# Patient Record
Sex: Male | Born: 2000 | Hispanic: Yes | Marital: Single | Smoking: Former smoker
Health system: Southern US, Community
[De-identification: ages and names within clinical notes are randomized; demographics above are authoritative.]

## PROBLEM LIST (undated history)

## (undated) VITALS — BP 138/90 | HR 59 | Temp 98.0°F | Resp 16

## (undated) DIAGNOSIS — L309 Dermatitis, unspecified: Secondary | ICD-10-CM

## (undated) DIAGNOSIS — F259 Schizoaffective disorder, unspecified: Secondary | ICD-10-CM

## (undated) HISTORY — DX: Schizoaffective disorder, unspecified: F25.9

---
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
## 2018-12-06 ENCOUNTER — Encounter (HOSPITAL_COMMUNITY): Payer: Self-pay

## 2018-12-06 ENCOUNTER — Emergency Department (HOSPITAL_COMMUNITY)
Admission: EM | Admit: 2018-12-06 | Discharge: 2018-12-07 | Disposition: A | Discharge status: Other Acute Inpatient Hospital | Payer: Self-pay | Attending: Emergency Medicine | Admitting: Emergency Medicine

## 2018-12-06 DIAGNOSIS — Z9114 Patient's other noncompliance with medication regimen: Secondary | ICD-10-CM | POA: Insufficient documentation

## 2018-12-06 DIAGNOSIS — F29 Unspecified psychosis not due to a substance or known physiological condition: Secondary | ICD-10-CM | POA: Insufficient documentation

## 2018-12-06 DIAGNOSIS — F489 Nonpsychotic mental disorder, unspecified: Secondary | ICD-10-CM | POA: Insufficient documentation

## 2018-12-06 DIAGNOSIS — F17210 Nicotine dependence, cigarettes, uncomplicated: Secondary | ICD-10-CM | POA: Insufficient documentation

## 2018-12-06 DIAGNOSIS — Z20828 Contact with and (suspected) exposure to other viral communicable diseases: Secondary | ICD-10-CM | POA: Insufficient documentation

## 2018-12-06 DIAGNOSIS — F69 Unspecified disorder of adult personality and behavior: Secondary | ICD-10-CM | POA: Insufficient documentation

## 2018-12-06 DIAGNOSIS — Z79899 Other long term (current) drug therapy: Secondary | ICD-10-CM | POA: Insufficient documentation

## 2018-12-06 DIAGNOSIS — Z046 Encounter for general psychiatric examination, requested by authority: Secondary | ICD-10-CM | POA: Insufficient documentation

## 2018-12-06 LAB — COMPREHENSIVE METABOLIC PANEL
ALT: 19 U/L (ref 0–44)
AST: 23 U/L (ref 15–41)
Albumin: 4.9 g/dL (ref 3.5–5.0)
Alkaline Phosphatase: 50 U/L (ref 38–126)
Anion gap: 7 (ref 5–15)
BUN: 11 mg/dL (ref 6–20)
CO2: 27 mmol/L (ref 22–32)
Calcium: 9.3 mg/dL (ref 8.9–10.3)
Chloride: 103 mmol/L (ref 98–111)
Creatinine, Ser: 0.84 mg/dL (ref 0.61–1.24)
GFR calc Af Amer: >60 mL/min (ref >60)
GFR calc non Af Amer: >60 mL/min (ref >60)
Glucose, Bld: 101 mg/dL — ABNORMAL HIGH (ref 70–99)
Potassium: 3.7 mmol/L (ref 3.5–5.1)
Sodium: 137 mmol/L (ref 135–145)
Total Bilirubin: 1.1 mg/dL (ref 0.3–1.2)
Total Protein: 8.2 g/dL — ABNORMAL HIGH (ref 6.5–8.1)

## 2018-12-06 LAB — CBC
HCT: 45.4 % (ref 39.0–52.0)
Hemoglobin: 15.1 g/dL (ref 13.0–17.0)
MCH: 31.3 pg (ref 26.0–34.0)
MCHC: 33.3 g/dL (ref 30.0–36.0)
MCV: 94.2 fL (ref 80.0–100.0)
Platelets: 301 10*3/uL (ref 150–400)
RBC: 4.82 MIL/uL (ref 4.22–5.81)
RDW: 12.9 % (ref 11.5–15.5)
WBC: 10.4 10*3/uL (ref 4.0–10.5)
nRBC: 0 % (ref 0.0–0.2)

## 2018-12-06 LAB — ETHANOL: Alcohol, Ethyl (B): <10 mg/dL (ref <10)

## 2018-12-06 LAB — SALICYLATE LEVEL: Salicylate Lvl: <7 mg/dL (ref 2.8–30.0)

## 2018-12-06 LAB — ACETAMINOPHEN LEVEL: Acetaminophen (Tylenol), Serum: <10 ug/mL — ABNORMAL LOW (ref 10–30)

## 2018-12-06 NOTE — ED Triage Notes (Signed)
Pt is IVC'd by his mother, saying that he wasn't taking his medications, he's hearing voices and he's violent towards his family, [employer] said there has been plenty of calls about this patient but he's always running when they arrive

## 2018-12-06 NOTE — ED Notes (Signed)
Pt given maroon scrubs, socks, and belongings bag

## 2018-12-06 NOTE — ED Provider Notes (Addendum)
[facility]   CSN: [id number] Arrival date & time: 12/06/18  2047     History   Chief Complaint Chief Complaint  Patient presents with  . IVC    HPI Jay Woods is a 18 y.o. male.     HPI Patient presents the emergency room for evaluation of violent behavior and possible hallucinations.  According to the police they have been called to the patient's home many times but he is generally running away when they arrived.  Today his mother took out IVC paperwork on him.  She indicates that he has not been taking his medications.  Patient himself denies any complaints.  He says that he thinks his mom just wants to be alone with her boyfriend.  Patient admits that he has not been taking his medications.  He states he prefers to smoke.  He denies any suicidal or homicidal ideation.  He denies having any hallucinations. Past Medical History:  Diagnosis Date  . Eczema     Patient Active Problem List   Diagnosis Date Noted  . Substance induced mood disorder (HCC) 02/28/2017  . Intermittent explosive disorder 02/27/2017  . 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 Intermittent explosive disorder in adult 02/27/2017    History reviewed. No pertinent surgical history.      Home Medications    Prior to Admission medications   Medication Sig Start Date End Date Taking? Authorizing Provider  acetaminophen (TYLENOL 8 HOUR) 650 MG CR tablet Take 1 tablet (650 mg total) by mouth every 8 (eight) hours as needed. Patient not taking: Reported on 12/06/2018 10/29/17   Derwood Kaplan, MD  ibuprofen (ADVIL,MOTRIN) 600 MG tablet Take 1 tablet (600 mg total) by mouth every 6 (six) hours as needed. Patient not taking: Reported on 12/06/2018 10/29/17   Derwood Kaplan, MD  lidocaine (XYLOCAINE) 2 % solution Use as directed 15 mLs in the mouth or throat as needed for mouth pain. Patient not taking: Reported on 12/06/2018  10/29/17   Derwood Kaplan, MD    Family History History reviewed. No pertinent family history.  Social History Social History   Tobacco Use  . Smoking status: Current Every Day Smoker    Packs/day: 1.00    Types: Cigarettes  . Smokeless tobacco: Former Engineer, water Use Topics  . Alcohol use: Yes  . Drug use: Yes     Allergies   Patient has no known allergies.   Review of Systems Review of Systems  All other systems reviewed and are negative.    Physical Exam Updated Vital Signs BP (!) 149/80 (BP Location: Left Arm)   Pulse 83   Temp 98.3 F (36.8 C) (Oral)   Resp 17   Ht 1.753 m (5\' 9" )   Wt 79.4 kg   SpO2 100%   BMI 25.84 kg/m   Physical Exam Vitals signs and nursing note reviewed.  Constitutional:      General: He is not in acute distress.    Appearance: He is well-developed.  HENT:     Head: Normocephalic and atraumatic.     Right Ear: External ear normal.     Left Ear: External ear normal.  Eyes:     General: No scleral icterus.       Right eye: No discharge.        Left eye: No discharge.     Conjunctiva/sclera: Conjunctivae normal.  Neck:     Musculoskeletal: Neck supple.  Trachea: No tracheal deviation.  Cardiovascular:     Rate and Rhythm: Normal rate and regular rhythm.  Pulmonary:     Effort: Pulmonary effort is normal. No respiratory distress.     Breath sounds: Normal breath sounds. No stridor. No wheezing or rales.  Abdominal:     General: Bowel sounds are normal. There is no distension.     Palpations: Abdomen is soft.     Tenderness: There is no abdominal tenderness. There is no guarding or rebound.  Musculoskeletal:        General: No tenderness.  Skin:    General: Skin is warm and dry.     Findings: No rash.  Neurological:     Mental Status: He is alert.     Cranial Nerves: No cranial nerve deficit (no facial droop, extraocular movements intact, no slurred speech).     Sensory: No sensory deficit.     Motor: No  abnormal muscle tone or seizure activity.     Coordination: Coordination normal.      ED Treatments / Results  Labs (all labs ordered are listed, but only abnormal results are displayed) Labs Reviewed  COMPREHENSIVE METABOLIC PANEL - Abnormal; Notable for the following components:      Result Value   Glucose, Bld 101 (*)    Total Protein 8.2 (*)    All other components within normal limits  CBC  ETHANOL  SALICYLATE LEVEL  ACETAMINOPHEN LEVEL  RAPID URINE DRUG SCREEN, HOSP PERFORMED    Procedures Procedures (including critical care time)  Medications Ordered in ED Medications - No data to display   Initial Impression / Assessment and Plan / ED Course  I have reviewed the triage vital signs and the nursing notes.  Pertinent labs & imaging results that were available during my care of the patient were reviewed by me and considered in my medical decision making (see chart for details).  Clinical Course as of Dec 06 2231  Tue Dec 06, 2018  2233 Is not clear to me that the patient requires inpatient hospitalization.  We will need to corroborate information with his mother.  TTS consult pending   [JK]    Clinical Course User Index [JK] Dorie Rank, MD  Laboratory tests are unremarkable.  Patient is cleared for psychiatric evaluation.  Final Clinical Impressions(s) / ED Diagnoses   Final diagnoses:  Mental and behavioral problem      Dorie Rank, MD 12/06/18 2232    Dorie Rank, MD 12/06/18 2233

## 2018-12-07 ENCOUNTER — Inpatient Hospital Stay (HOSPITAL_COMMUNITY)
Admission: AD | Admit: 2018-12-07 | Discharge: 2018-12-13 | DRG: 885 | Disposition: A | Discharge status: Home / Self Care | Payer: No Typology Code available for payment source | Attending: Psychiatry | Admitting: Psychiatry

## 2018-12-07 ENCOUNTER — Encounter (HOSPITAL_COMMUNITY): Payer: Self-pay | Admitting: Registered Nurse

## 2018-12-07 ENCOUNTER — Emergency Department (HOSPITAL_COMMUNITY): Payer: Self-pay

## 2018-12-07 ENCOUNTER — Encounter (HOSPITAL_COMMUNITY): Payer: Self-pay

## 2018-12-07 DIAGNOSIS — F259 Schizoaffective disorder, unspecified: Principal | ICD-10-CM | POA: Diagnosis present

## 2018-12-07 DIAGNOSIS — Z20828 Contact with and (suspected) exposure to other viral communicable diseases: Secondary | ICD-10-CM | POA: Diagnosis present

## 2018-12-07 DIAGNOSIS — F17210 Nicotine dependence, cigarettes, uncomplicated: Secondary | ICD-10-CM | POA: Diagnosis present

## 2018-12-07 DIAGNOSIS — G4700 Insomnia, unspecified: Secondary | ICD-10-CM | POA: Diagnosis present

## 2018-12-07 DIAGNOSIS — F29 Unspecified psychosis not due to a substance or known physiological condition: Secondary | ICD-10-CM | POA: Diagnosis present

## 2018-12-07 LAB — RAPID URINE DRUG SCREEN, HOSP PERFORMED
Amphetamines: NOT DETECTED
Barbiturates: NOT DETECTED
Benzodiazepines: POSITIVE — AB
Cocaine: NOT DETECTED
Opiates: NOT DETECTED
Tetrahydrocannabinol: NOT DETECTED

## 2018-12-07 LAB — SARS CORONAVIRUS 2 BY RT PCR (HOSPITAL ORDER, PERFORMED IN ~~LOC~~ HOSPITAL LAB): SARS Coronavirus 2: NEGATIVE

## 2018-12-07 MED ORDER — ACETAMINOPHEN 325 MG PO TABS
650.0000 mg | ORAL_TABLET | Freq: Four times a day (QID) | ORAL | Status: DC | PRN
  Administered 2018-12-07 – 2018-12-12 (×2): 650 mg via ORAL
  Filled 2018-12-07 (×3): qty 2

## 2018-12-07 MED ORDER — HYDROXYZINE HCL 25 MG PO TABS: 25.0000 mg | ORAL_TABLET | Freq: Four times a day (QID) | ORAL | Status: DC | PRN

## 2018-12-07 MED ORDER — TRAZODONE HCL 50 MG PO TABS: 50.0000 mg | ORAL_TABLET | Freq: Every evening | ORAL | Status: DC | PRN

## 2018-12-07 MED ORDER — OLANZAPINE 2.5 MG PO TABS
2.5000 mg | ORAL_TABLET | Freq: Every day | ORAL | Status: DC
  Administered 2018-12-07: 2.5 mg via ORAL
  Filled 2018-12-07 (×3): qty 1

## 2018-12-07 NOTE — Progress Notes (Signed)
Pt has flight of ideas , disorganized thought content at times , and pressured speech.

## 2018-12-07 NOTE — Progress Notes (Signed)
Received Jay Woods from the main ED, alert and calm. Later he spoke with TTS in depth and his IVC paperwork was faxed to TTS. He drifted off to sleep with the sitter at the bedside. He slept throughout the night without incident.

## 2018-12-07 NOTE — BH Assessment (Signed)
Tele Assessment Note   Patient Name: Jay Woods MRN: [id number] Referring Physician: Dr. Dorie Rank Location of Patient: [facility] Location of Provider: [place] is an 18 y.o. male presenting under IVC, petitioned by mother for not taking his medications, hearing voices and acting violent towards family. Per [employer], they have received plenty of calls about this patient but he's always running when they arrive. When asked patient why is he here, he stated "my mom loves my dad and when she sees me I remind her of him so I am okay with that". Patient continues to go on with flight of ideas, hyper verbal reminiscing about childhood, dreams and then crying stating his sister is his main support. Patient spoke religiously and continually about his sister, soccer and his faith in [place]. Patient continued to ramble about wearing an ankle bracelet, "its for the children so they can follow their dreams" and his job, "if you have 2 arms, 2 legs, 10 fingers, eat, know self love and faith". Patient reported living with mother and 2 sisters in the household as well. Patient reported being employed. Patient denied SI, HI and psychosis. Patient was talkative and cooperative during assessment.       PER IVC: [name] is suicidal and it hearing voices and talking to unknown people, respondent is abusing drugs and very violent.                                                                                                                                            Diagnosis: Psychosis disorder, unspecified  Past Medical History:  Past Medical History:  Diagnosis Date  . Eczema     History reviewed. No pertinent surgical history.  Family History: History reviewed. No pertinent family history.  Social History:  reports that he has been smoking cigarettes. He has been smoking about 1.00 pack per day. He has quit using smokeless tobacco. He reports current alcohol use.  He reports current drug use.  Additional Social History:  Alcohol / Drug Use Pain Medications: see MAR Prescriptions: see MAR Over the Counter: see MAR  CIWA: CIWA-Ar BP: (!) 149/80 Pulse Rate: 83 COWS:    Allergies: No Known Allergies  Home Medications: (Not in a hospital admission)   OB/GYN Status:  No LMP for male patient.  General Assessment Data Location of Assessment: [facility] TTS Assessment: In system Is this a Tele or Face-to-Face Assessment?: Tele Assessment Is this an Initial Assessment or a Re-assessment for this encounter?: Initial Assessment Patient Accompanied by:: N/A Living Arrangements: (family home) What gender do you identify as?: Male Marital status: Single Living Arrangements: Parent, Other relatives Can pt return to current living arrangement?: Yes Admission Status: Involuntary Petitioner: Family member Is patient capable of signing voluntary admission?: (IVC) Referral Source: Self/Family/Friend     Crisis Care Plan Living Arrangements: Parent, Other relatives Legal Guardian: (  self) Name of Psychiatrist: (none) Name of Therapist: (none)  Education Status Is patient currently in school?: No Is the patient employed, unemployed or receiving disability?: Employed  Risk to self with the past 6 months Suicidal Ideation: No Has patient been a risk to self within the past 6 months prior to admission? : No Suicidal Intent: No Has patient had any suicidal intent within the past 6 months prior to admission? : No Is patient at risk for suicide?: No Suicidal Plan?: No Has patient had any suicidal plan within the past 6 months prior to admission? : No Access to Means: No What has been your use of drugs/alcohol within the last 12 months?: (unknown) Previous Attempts/Gestures: No How many times?: (0) Other Self Harm Risks: (none) Triggers for Past Attempts: (n/a) Intentional Self Injurious Behavior: None Family Suicide History: No Recent stressful  life event(s): (family conflict) Persecutory voices/beliefs?: No Depression: (unable to assess) Depression Symptoms: (unable to assess) Substance abuse history and/or treatment for substance abuse?: No Suicide prevention information given to non-admitted patients: Not applicable  Risk to Others within the past 6 months Homicidal Ideation: No Does patient have any lifetime risk of violence toward others beyond the six months prior to admission? : No Thoughts of Harm to Others: No Current Homicidal Intent: No Current Homicidal Plan: No Access to Homicidal Means: No Identified Victim: (n/a) History of harm to others?: Yes(IVC- towards family) Assessment of Violence: None Noted Violent Behavior Description: (IVC- towards family) Does patient have access to weapons?: No Criminal Charges Pending?: No Does patient have a court date: No Is patient on probation?: Yes(possession of stolen vehicle)  Psychosis Hallucinations: Visual, Auditory(per IVC) Delusions: None noted  Mental Status Report Appearance/Hygiene: Unremarkable Eye Contact: Fair Motor Activity: Freedom of movement Speech: Rapid(hyper verbal) Level of Consciousness: Alert Mood: Anxious Affect: Anxious Anxiety Level: Moderate Thought Processes: Coherent, Flight of Ideas Judgement: Impaired Orientation: Person, Place, Time, Situation Obsessive Compulsive Thoughts/Behaviors: None  Cognitive Functioning Concentration: Normal Memory: Recent Intact Is patient IDD: No Insight: Unable to Assess Impulse Control: Poor Appetite: Good Have you had any weight changes? : No Change Sleep: Unable to Assess Total Hours of Sleep: (unable to assess) Vegetative Symptoms: None  ADLScreening [facility] Assessment Services) Patient's cognitive ability adequate to safely complete daily activities?: Yes Patient able to express need for assistance with ADLs?: Yes Independently performs ADLs?: Yes (appropriate for developmental  age)  Prior Inpatient Therapy Prior Inpatient Therapy: No  Prior Outpatient Therapy Prior Outpatient Therapy: No Does patient have an ACCT team?: No Does patient have Intensive In-House Services?  : No Does patient have [facility] services? : No Does patient have P4CC services?: No  ADL Screening (condition at time of admission) Patient's cognitive ability adequate to safely complete daily activities?: Yes Patient able to express need for assistance with ADLs?: Yes Independently performs ADLs?: Yes (appropriate for developmental age)  Disposition:  Disposition Initial Assessment Completed for this Encounter: Yes  Sherron Flemings, NP, patient meets inpatient treatment. TTS to secure placement.  This service was provided via telemedicine using a 2-way, interactive audio and video technology.  Names of all persons participating in this telemedicine service and their role in this encounter. Name: Gail Artica-Limones Role: Patient  Name: Al Corpus Role: TTS Clinician  Name:  Role:   Name:  Role:     Burnetta Sabin 12/07/2018 12:58 AM

## 2018-12-07 NOTE — Progress Notes (Signed)
D: Pt denies SI/HI/AVH. Pt is pleasant and cooperative. Pt remembered writer from previous stay. A: Pt was offered support and encouragement. Pt was given scheduled medications. Pt was encourage to attend groups. Q 15 minute checks were done for safety.  EKG completed  R: safety maintained on unit.

## 2018-12-07 NOTE — BH Assessment (Signed)
[facility] Assessment Progress Note  Per Hampton Abbot, MD, this pt requires psychiatric hospitalization.  Heather, RN has assigned pt to [facility] Rm 501-1.  Pt presents under IVC initiated by pt's mother, and upheld by Dr Dwyane Dee, and IVC documents have been faxed to [facility].  Pt's nurse, Eustaquio Maize, has been notified, and agrees to call report to [phone].  Pt is to be transported via [facility].   Jalene Mullet, [place] Coordinator [phone]

## 2018-12-07 NOTE — ED Notes (Signed)
Pt off unit  to [facility]. Pt alert, calm. Cooperative, no s/s of distrss.  DC information given to [employer] for [facility].  Belongings given to [employer] for [facility]. Pt ambulatory off unit to home. Pt transported by [employer],

## 2018-12-07 NOTE — ED Notes (Signed)
Pt cooperative. Pt rambling.

## 2018-12-08 ENCOUNTER — Other Ambulatory Visit: Payer: Self-pay

## 2018-12-08 DIAGNOSIS — F250 Schizoaffective disorder, bipolar type: Secondary | ICD-10-CM

## 2018-12-08 MED ORDER — HALOPERIDOL LACTATE 5 MG/ML IJ SOLN: 10.0000 mg | Freq: Four times a day (QID) | INTRAMUSCULAR | Status: DC | PRN

## 2018-12-08 MED ORDER — LORAZEPAM 2 MG/ML IJ SOLN: 2.0000 mg | Freq: Four times a day (QID) | INTRAMUSCULAR | Status: DC | PRN

## 2018-12-08 MED ORDER — BENZTROPINE MESYLATE 1 MG PO TABS
1.0000 mg | ORAL_TABLET | Freq: Two times a day (BID) | ORAL | Status: DC
  Administered 2018-12-08 – 2018-12-13 (×11): 1 mg via ORAL
  Filled 2018-12-08 (×13): qty 1

## 2018-12-08 MED ORDER — LORAZEPAM 1 MG PO TABS: 2.0000 mg | ORAL_TABLET | Freq: Four times a day (QID) | ORAL | Status: DC | PRN

## 2018-12-08 MED ORDER — RISPERIDONE 2 MG PO TABS
4.0000 mg | ORAL_TABLET | Freq: Two times a day (BID) | ORAL | Status: DC
  Administered 2018-12-08 – 2018-12-13 (×11): 4 mg via ORAL
  Filled 2018-12-08 (×13): qty 2

## 2018-12-08 MED ORDER — TEMAZEPAM 30 MG PO CAPS
30.0000 mg | ORAL_CAPSULE | Freq: Every day | ORAL | Status: DC
  Administered 2018-12-08 – 2018-12-12 (×5): 30 mg via ORAL
  Filled 2018-12-08 (×5): qty 1

## 2018-12-08 MED ORDER — HALOPERIDOL 5 MG PO TABS: 5.0000 mg | ORAL_TABLET | Freq: Four times a day (QID) | ORAL | Status: DC | PRN

## 2018-12-08 MED ORDER — DIVALPROEX SODIUM 250 MG PO DR TAB
250.0000 mg | DELAYED_RELEASE_TABLET | Freq: Three times a day (TID) | ORAL | Status: DC
  Administered 2018-12-08 – 2018-12-09 (×3): 250 mg via ORAL
  Filled 2018-12-08 (×6): qty 1

## 2018-12-08 NOTE — [facility] Group Notes (Signed)
LCSW Group Therapy Note  Date and Time: 12/08/2018 @ 1:30pm  Type of Therapy and Topic: Group Therapy: Feelings Around Returning Home & Establishing a Supportive Framework and Supporting Oneself When Supports Not Available  Participation Level: [facility] PARTICIPATION LEVEL: Did Not Attend  Mood: Did not attend     Description of Group:  Patients first processed thoughts and feelings about upcoming discharge. These included fears of upcoming changes, lack of change, new living environments, judgements and expectations from others and overall stigma of mental health issues. The group then discussed the definition of a supportive framework, what that looks and feels like, and how do to discern it from an unhealthy non-supportive network. The group identified different types of supports as well as what to do when your family/friends are less than helpful or unavailable     Therapeutic Goals   1.  Patient will identify one healthy supportive network that they can use at discharge.  2.  Patient will identify one factor of a supportive framework and how to tell it from an unhealthy network.  3.  Patient able to identify one coping skill to use when they do not have positive supports from others.  4.  Patient will demonstrate ability to communicate their needs through discussion and/or role plays.     Summary of Patient Progress:       Patient did not attend group therapy today.      Therapeutic Modalities  Cognitive Behavioral Therapy  Motivational Interviewing   Konni Kesinger, MSW, LCSW  
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
## 2018-12-08 NOTE — Progress Notes (Signed)
Recreation Therapy Notes  INPATIENT RECREATION THERAPY ASSESSMENT  Patient Details Name: Eithen Castiglia MRN: [id number] DOB: 12-Nov-2000 Today's Date: 12/08/2018       Information Obtained From: Patient  Able to Participate in Assessment/Interview: Yes  Patient Presentation: Alert  Reason for Admission (Per Patient): Other (Comments)(Pt stated he was depressed.)  Patient Stressors: (No stressors identified)  Coping Skills:   TV, Sports, Music, Exercise, Deep Breathing, Substance Abuse, Talk, Art, Prayer, Other (Comment), Read, Hot Bath/Shower(Sing)  Leisure Interests (2+):  Individual - Reading, Music - Play instrument, Sports - Other (Comment), Sports - Football([place]; Soccer)  Frequency of Recreation/Participation: Other (Comment)(Not lately)  Awareness of Community Resources:  Yes  Community Resources:  Park, Patent examiner  Current Use: Yes  [place] of Residence:  [place]  Patient Main Form of Transportation: Car(Pt also stated he uses Uber/Taxi)  Patient Strengths:  Faith; Supportive person  Patient Identified Areas of Improvement:  Listen more  Patient Goal for Hospitalization:  "make everybody happy and let everybody know if they have faith, they can do anything they want".  Current SI (including self-harm):  No  Current HI:  No  Current AVH: No  Staff Intervention Plan: Group Attendance, Collaborate with Interdisciplinary Treatment Team  Consent to Intern Participation: N/A   Victorino Sparrow, LRT/CTRS  Victorino Sparrow A 12/08/2018, 12:19 PM

## 2018-12-08 NOTE — Progress Notes (Signed)
Pt was on the unit at shift change, but the admission had to be completed on night shift  Admission Note:  18 yr male who presents IVC in no acute distress for the treatment of Psychosis  Pt appears flat and depressed. Pt was calm and cooperative with admission process. Pt presents with passive SI and contracts for safety upon admission. Pt denies AVH . Pt stated he was doing too much CBD oil and his mother wanted him to get checked out. Pt hypo-manic, with pressured speech and disorganized and flight of ideas at times, but pt has been pleasant with the admission especially after recognizing writer from prior admission.   A:Skin was assessed by previous shift. PT searched by previous shift. Consents obtained by previous shift   R:Pt had no additional questions or concerns.

## 2018-12-08 NOTE — Progress Notes (Signed)
Recreation Therapy Notes  Date: 10.15.20 Time: 1000 Location:  [address] Dayroom  Group Topic: Communication, Team Building, Problem Solving  Goal Area(s) Addresses:  Patient will effectively work with peer towards shared goal.  Patient will identify skills used to make activity successful.  Patient will identify how skills used during activity can be used to reach post d/c goals.   Behavioral Response: Engaged  Intervention: STEM Activity  Activity: Geophysicist/field seismologist. In teams patients were given 12 plastic drinking straws and a length of masking tape. Using the materials provided patients were asked to build a landing pad to catch a golf ball dropped from approximately 6 feet in the air.   Education: Education officer, community, Discharge Planning   Education Outcome: Acknowledges education/In group clarification offered/Needs additional education.   Clinical Observations/Feedback:  Pt worked well with peers.  Pt communicated with peers on how to create their landing pad.  Pt worked well with the group to come up with a concept and implement it.      Victorino Sparrow, LRT/CTRS     Victorino Sparrow A 12/08/2018 11:51 AM

## 2018-12-08 NOTE — Tx Team (Signed)
Initial Treatment Plan 12/08/2018 2:18 AM [name] PRX:[id number]    PATIENT STRESSORS: Marital or family conflict Medication change or noncompliance   PATIENT STRENGTHS: General fund of knowledge Motivation for treatment/growth   PATIENT IDENTIFIED PROBLEMS: psychosis  "medicine, sleeping medicine so I don't have to use CBD oil or weed to go to sleep"                   DISCHARGE CRITERIA:  Improved stabilization in mood, thinking, and/or behavior Verbal commitment to aftercare and medication compliance  PRELIMINARY DISCHARGE PLAN: Attend PHP/IOP Outpatient therapy  PATIENT/FAMILY INVOLVEMENT: This treatment plan has been presented to and reviewed with the patient, Jay Woods.  The patient and family have been given the opportunity to ask questions and make suggestions.  Providence Crosby, RN 12/08/2018, 2:18 AM

## 2018-12-08 NOTE — BHH Suicide Risk Assessment (Signed)
[facility] Admission Suicide Risk Assessment   Nursing information obtained from:  Patient Demographic factors:  Male, Unemployed Current Mental Status:  NA Loss Factors:  Financial problems / change in socioeconomic status Historical Factors:  NA Risk Reduction Factors:  Positive social support  Total Time spent with patient: 45 minutes Principal Problem: schizoaffective  Diagnosis:  Active Problems:   Psychotic disorder ([place])  Subjective Data: admitted for mania  Continued Clinical Symptoms:  Alcohol Use Disorder Identification Test Final Score (AUDIT): 6 The "Alcohol Use Disorders Identification Test", Guidelines for Use in Primary Care, Second Edition.  World Pharmacologist [facility]). Score between 0-7:  no or low risk or alcohol related problems. Score between 8-15:  moderate risk of alcohol related problems. Score between 16-19:  high risk of alcohol related problems. Score 20 or above:  warrants further diagnostic evaluation for alcohol dependence and treatment.   CLINICAL FACTORS: mania Musculoskeletal: Strength & Muscle Tone: within normal limits Gait & Station: normal Patient leans: N/A  Psychiatric Specialty Exam: Physical Exam  ROS  Blood pressure 106/75, pulse 71.There is no height or weight on file to calculate BMI.  General Appearance: Casual  Eye Contact:  Good  Speech:  Pressured  Volume:  Increased  Mood:  manic  Affect:  Congruent  Thought Process:  Descriptions of Associations: Loose  Orientation:  Other:  person place  Thought Content:  Illogical  Suicidal Thoughts:  No  Homicidal Thoughts:  No  Memory:  Immediate;   Poor Recent;   Poor Remote;   Fair  Judgement:  Impaired  Insight:  Lacking  Psychomotor Activity:  Normal  Concentration:  Concentration: Poor and Attention Span: Poor  Recall:  Poor  Fund of Knowledge:  Fair  Language:  Fair  Akathisia:  Negative  Handed:  Right  AIMS (if indicated):     Assets:  Physical  Health Resilience Social Support  ADL's:  Intact  Cognition:  WNL  Sleep:  Number of Hours: 6.75      COGNITIVE FEATURES THAT CONTRIBUTE TO RISK:  Loss of executive function    SUICIDE RISK:   Minimal: No identifiable suicidal ideation.  Patients presenting with no risk factors but with morbid ruminations; may be classified as minimal risk based on the severity of the depressive symptoms  PLAN OF CARE: admit for stabalization  I certify that inpatient services furnished can reasonably be expected to improve the patient's condition.   Johnn Hai, MD 12/08/2018, 10:01 AM

## 2018-12-08 NOTE — Progress Notes (Signed)
Psychoeducational Group Note  Date:  12/08/2018 Time: 2043  Group Topic/Focus:  Wrap-Up Group:   The focus of this group is to help patients review their daily goal of treatment and discuss progress on daily workbooks.  Participation Level: Did Not Attend  Participation Quality:  Not Applicable  Affect:  Not Applicable  Cognitive:  Not Applicable  Insight:  Not Applicable  Engagement in Group: Not Applicable  Additional Comments:  The patient did not attend group this evening.   Archie Balboa S 12/08/2018, 8:43 PM

## 2018-12-08 NOTE — Progress Notes (Signed)
D: Pt stated he was ok A: Pt was offered support and encouragement. Pt was given scheduled medications. Pt was encourage to attend groups. Q 15 minute checks were done for safety.  R:Pt attends groups and interacts well with peers and staff. Pt is taking medication. Pt has no complaints.Pt receptive to treatment and safety maintained on unit.

## 2018-12-09 MED ORDER — DIVALPROEX SODIUM 500 MG PO DR TAB
500.0000 mg | DELAYED_RELEASE_TABLET | Freq: Three times a day (TID) | ORAL | Status: DC
  Administered 2018-12-09 – 2018-12-13 (×12): 500 mg via ORAL
  Filled 2018-12-09 (×15): qty 1

## 2018-12-09 MED ORDER — ARIPIPRAZOLE 2 MG PO TABS
2.0000 mg | ORAL_TABLET | Freq: Once | ORAL | Status: AC
  Administered 2018-12-09: 14:00:00 2 mg via ORAL
  Filled 2018-12-09 (×2): qty 1

## 2018-12-09 NOTE — Progress Notes (Signed)
Spirituality group facilitated by Simone Curia, MDiv, BCC.  Group Description:  Group focused on topic of hope.  Patients participated in facilitated discussion around topic, connecting with one another around experiences and definitions for hope.  Group members engaged with the sentence "hope is," reflecting on what hope looks like for them today.  Group engaged in discussion around how their definitions of hope are present today in hospital.   Modalities: Psycho-social ed, Adlerian, Narrative, MI Patient Progress: Jay Woods was present throughout group.  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  Group noted that Jay Woods's affect brightened when speaking of soccer and connected this to group topic.

## 2018-12-09 NOTE — Progress Notes (Signed)
D: Pt denies SI/HI/AVH. Pt is pleasant and cooperative. Pt visible on the unit this evening.   A: Pt was offered support and encouragement. Pt was given scheduled medications. Pt was encourage to attend groups. Q 15 minute checks were done for safety.   R:Pt attends groups and interacts well with peers and staff. Pt is taking medication. Pt has no complaints.Pt receptive to treatment and safety maintained on unit.

## 2018-12-09 NOTE — Progress Notes (Signed)
Recreation Therapy Notes  Date: 10.16.20 Time: 1015 Location: [address] Dayroom  Group Topic: Self-Esteem  Goal Area(s) Addresses:  Patient will successfully identify positive attributes about themselves.  Patient will successfully identify benefit of improved self-esteem.   Behavioral Response: Engaged  Intervention: Markers, colored pencils, construction paper, outline of license plate, music  Activity: Personalized Plate.  Patients were to create a personalized licensed plate that highlighted all of the positive qualities about them and anything that makes them unique.  Education:  Self-Esteem, Dentist.   Education Outcome: Acknowledges education/In group clarification offered/Needs additional education  Clinical Observations/Feedback: Pt created a plate of a Spiderman cover for a bible.  Pt also put the words "God bless everybody, God loves everybody" and "every male is pretty".  Pt was active and very engaged.    Victorino Sparrow, LRT/CTRS     Victorino Sparrow A 12/09/2018 12:24 PM

## 2018-12-09 NOTE — Progress Notes (Signed)
[facility] MD Progress Note  12/09/2018 10:29 AM Jay Woods  MRN:  [id number] Subjective:    Patient seen in morning rounds also seen in the context of treatment team he remains hypomanic, has flight of ideas but is overall expansive and pleasant in the context of his manic symptomatology.  He denies thoughts of harming self or others denies hallucinations makes disjointed/quasi-delusional statements that are ill-defined.  Principal Problem: Chronically undertreated bipolar disorder Diagnosis: Active Problems:   Psychotic disorder (HCC)  Total Time spent with patient: 20 minutes  Past Psychiatric History: Last here January 2019  Past Medical History:  Past Medical History:  Diagnosis Date  . Eczema    History reviewed. No pertinent surgical history. Family History: History reviewed. No pertinent family history. Family Psychiatric  History: No new data shared Social History:  Social History   Substance and Sexual Activity  Alcohol Use Yes     Social History   Substance and Sexual Activity  Drug Use Yes  . Types: Marijuana    Social History   Socioeconomic History  . Marital status: Single    Spouse name: Not on file  . Number of children: Not on file  . Years of education: Not on file  . Highest education level: Not on file  Occupational History  . Not on file  Social Needs  . Financial resource strain: Not on file  . Food insecurity    Worry: Not on file    Inability: Not on file  . Transportation needs    Medical: Not on file    Non-medical: Not on file  Tobacco Use  . Smoking status: Current Every Day Smoker    Packs/day: 1.00    Years: 5.00    Pack years: 5.00    Types: Cigarettes  . Smokeless tobacco: Former Engineer, water and Sexual Activity  . Alcohol use: Yes  . Drug use: Yes    Types: Marijuana  . Sexual activity: Not Currently  Lifestyle  . Physical activity    Days per week: Not on file    Minutes per session: Not on file  . Stress:  Not on file  Relationships  . Social Musician on phone: Not on file    Gets together: Not on file    Attends religious service: Not on file    Active member of club or organization: Not on file    Attends meetings of clubs or organizations: Not on file    Relationship status: Not on file  Other Topics Concern  . Not on file  Social History Narrative  . Not on file   Additional Social History:                         Sleep: Fair  Appetite:  Fair  Current Medications: Current Facility-Administered Medications  Medication Dose Route Frequency Provider Last Rate Last Dose  . acetaminophen (TYLENOL) tablet 650 mg  650 mg Oral Q6H PRN Jearld Lesch, NP   650 mg at 12/07/18 2123  . ARIPiprazole (ABILIFY) tablet 2 mg  2 mg Oral Once Malvin Johns, MD      . benztropine (COGENTIN) tablet 1 mg  1 mg Oral BID Malvin Johns, MD   1 mg at 12/09/18 8299  . divalproex (DEPAKOTE) DR tablet 500 mg  500 mg Oral Q8H Malvin Johns, MD      . haloperidol (HALDOL) tablet 5 mg  5 mg Oral Q6H PRN  Johnn Hai, MD       Or  . haloperidol lactate (HALDOL) injection 10 mg  10 mg Intramuscular Q6H PRN Johnn Hai, MD      . hydrOXYzine (ATARAX/VISTARIL) tablet 25 mg  25 mg Oral Q6H PRN Deloria Lair, NP      . LORazepam (ATIVAN) tablet 2 mg  2 mg Oral Q6H PRN Johnn Hai, MD       Or  . LORazepam (ATIVAN) injection 2 mg  2 mg Intramuscular Q6H PRN Johnn Hai, MD      . risperiDONE (RISPERDAL) tablet 4 mg  4 mg Oral BID Johnn Hai, MD   4 mg at 12/09/18 9604  . temazepam (RESTORIL) capsule 30 mg  30 mg Oral QHS Johnn Hai, MD   30 mg at 12/08/18 2105  . traZODone (DESYREL) tablet 50 mg  50 mg Oral QHS PRN,MR X 1 Dixon, Rashaun M, NP        Lab Results: No results found for this or any previous visit (from the past 48 hour(s)).  Blood Alcohol level:  Lab Results  Component Value Date   ETH <10 54/10/8117    Metabolic Disorder Labs: Lab Results  Component Value  Date   HGBA1C 5.1 02/28/2017   MPG 99.67 02/28/2017   No results found for: PROLACTIN Lab Results  Component Value Date   CHOL 152 02/28/2017   TRIG 64 02/28/2017   HDL 41 02/28/2017   CHOLHDL 3.7 02/28/2017   VLDL 13 02/28/2017   LDLCALC 98 02/28/2017    Physical Findings: AIMS: Facial and Oral Movements Muscles of Facial Expression: None, normal Lips and Perioral Area: None, normal Jaw: None, normal Tongue: None, normal,Extremity Movements Upper (arms, wrists, hands, fingers): None, normal Lower (legs, knees, ankles, toes): None, normal, Trunk Movements Neck, shoulders, hips: None, normal, Overall Severity Severity of abnormal movements (highest score from questions above): None, normal Incapacitation due to abnormal movements: None, normal Patient's awareness of abnormal movements (rate only patient's report): No Awareness, Dental Status Current problems with teeth and/or dentures?: No Does patient usually wear dentures?: No  CIWA:  CIWA-Ar Total: 0 COWS:  COWS Total Score: 0  Musculoskeletal: Strength & Muscle Tone: within normal limits Gait & Station: normal Patient leans: N/A  Psychiatric Specialty Exam: Physical Exam  ROS  Blood pressure 130/78, pulse 83, temperature 98.5 F (36.9 C), temperature source Oral, resp. rate 16.There is no height or weight on file to calculate BMI.  General Appearance: Casual  Eye Contact:  Good  Speech:  Pressured  Volume:  Normal  Mood:  Hypomanic/cordial/pleasant and expansive in the context of his mania  Affect:  Congruent  Thought Process:  Linear and Descriptions of Associations: Loose  Orientation:  Full (Time, Place, and Person)  Thought Content:  Disjointed/flight of ideas  Suicidal Thoughts:  No  Homicidal Thoughts:  No  Memory:  Immediate;   Fair Recent;   Fair Remote;   Good  Judgement:  Fair  Insight:  Fair  Psychomotor Activity:  Normal  Concentration:  Concentration: Fair and Attention Span: Fair  Recall:   [employer] of Knowledge:  Fair  Language:  Fair  Akathisia:  Negative  Handed:  Right  AIMS (if indicated):     Assets:  Physical Health Resilience  ADL's:  Intact  Cognition:  WNL  Sleep:  Number of Hours: 7.5     Treatment Plan Summary: Daily contact with patient to assess and evaluate symptoms and progress in treatment and Medication  management  Continue Risperdal escalate Depakote continue benztropine all for bipolar symptomatology continue reality based therapy and current precautions continue med and illness education  Malvin JohnsFARAH,Threasa Kinch, MD 12/09/2018, 10:29 AM

## 2018-12-09 NOTE — Tx Team (Signed)
Interdisciplinary Treatment and Diagnostic Plan Update  12/09/2018 Time of Session: 10:00am Jay Woods MRN: [id number]  Principal Diagnosis: <principal problem not specified>  Secondary Diagnoses: Active Problems:   Psychotic disorder (HCC)   Current Medications:  Current Facility-Administered Medications  Medication Dose Route Frequency Provider Last Rate Last Dose  . acetaminophen (TYLENOL) tablet 650 mg  650 mg Oral Q6H PRN Jearld Lesch, NP   650 mg at 12/07/18 2123  . ARIPiprazole (ABILIFY) tablet 2 mg  2 mg Oral Once Malvin Johns, MD      . benztropine (COGENTIN) tablet 1 mg  1 mg Oral BID Malvin Johns, MD   1 mg at 12/09/18 4709  . divalproex (DEPAKOTE) DR tablet 500 mg  500 mg Oral Q8H Malvin Johns, MD      . haloperidol (HALDOL) tablet 5 mg  5 mg Oral Q6H PRN Malvin Johns, MD       Or  . haloperidol lactate (HALDOL) injection 10 mg  10 mg Intramuscular Q6H PRN Malvin Johns, MD      . hydrOXYzine (ATARAX/VISTARIL) tablet 25 mg  25 mg Oral Q6H PRN Jearld Lesch, NP      . LORazepam (ATIVAN) tablet 2 mg  2 mg Oral Q6H PRN Malvin Johns, MD       Or  . LORazepam (ATIVAN) injection 2 mg  2 mg Intramuscular Q6H PRN Malvin Johns, MD      . risperiDONE (RISPERDAL) tablet 4 mg  4 mg Oral BID Malvin Johns, MD   4 mg at 12/09/18 6283  . temazepam (RESTORIL) capsule 30 mg  30 mg Oral QHS Malvin Johns, MD   30 mg at 12/08/18 2105  . traZODone (DESYREL) tablet 50 mg  50 mg Oral QHS PRN,MR X 1 Dixon, Rashaun M, NP       PTA Medications: Medications Prior to Admission  Medication Sig Dispense Refill Last Dose  . acetaminophen (TYLENOL 8 HOUR) 650 MG CR tablet Take 1 tablet (650 mg total) by mouth every 8 (eight) hours as needed. (Patient not taking: Reported on 12/06/2018) 30 tablet 0   . ibuprofen (ADVIL,MOTRIN) 600 MG tablet Take 1 tablet (600 mg total) by mouth every 6 (six) hours as needed. (Patient not taking: Reported on 12/06/2018) 30 tablet 0   . lidocaine  (XYLOCAINE) 2 % solution Use as directed 15 mLs in the mouth or throat as needed for mouth pain. (Patient not taking: Reported on 12/06/2018) 20 mL 0     Patient Stressors: Marital or family conflict Medication change or noncompliance  Patient Strengths: Geographical information systems officer for treatment/growth  Treatment Modalities: Medication Management, Group therapy, Case management,  1 to 1 session with clinician, Psychoeducation, Recreational therapy.   Physician Treatment Plan for Primary Diagnosis: <principal problem not specified> Long Term Goal(s): Improvement in symptoms so as ready for discharge Improvement in symptoms so as ready for discharge   Short Term Goals: Ability to identify changes in lifestyle to reduce recurrence of condition will improve Ability to verbalize feelings will improve Ability to disclose and discuss suicidal ideas Ability to demonstrate self-control will improve Ability to disclose and discuss suicidal ideas Ability to demonstrate self-control will improve Ability to identify and develop effective coping behaviors will improve Ability to maintain clinical measurements within normal limits will improve  Medication Management: Evaluate patient's response, side effects, and tolerance of medication regimen.  Therapeutic Interventions: 1 to 1 sessions, Unit Group sessions and Medication administration.  Evaluation of Outcomes: Progressing  Physician  Treatment Plan for Secondary Diagnosis: Active Problems:   Psychotic disorder ([place])  Long Term Goal(s): Improvement in symptoms so as ready for discharge Improvement in symptoms so as ready for discharge   Short Term Goals: Ability to identify changes in lifestyle to reduce recurrence of condition will improve Ability to verbalize feelings will improve Ability to disclose and discuss suicidal ideas Ability to demonstrate self-control will improve Ability to disclose and discuss suicidal  ideas Ability to demonstrate self-control will improve Ability to identify and develop effective coping behaviors will improve Ability to maintain clinical measurements within normal limits will improve     Medication Management: Evaluate patient's response, side effects, and tolerance of medication regimen.  Therapeutic Interventions: 1 to 1 sessions, Unit Group sessions and Medication administration.  Evaluation of Outcomes: Progressing   RN Treatment Plan for Primary Diagnosis: <principal problem not specified> Long Term Goal(s): Knowledge of disease and therapeutic regimen to maintain health will improve  Short Term Goals: Ability to participate in decision making will improve, Ability to verbalize feelings will improve, Ability to disclose and discuss suicidal ideas, Ability to identify and develop effective coping behaviors will improve and Compliance with prescribed medications will improve  Medication Management: RN will administer medications as ordered by provider, will assess and evaluate patient's response and provide education to patient for prescribed medication. RN will report any adverse and/or side effects to prescribing provider.  Therapeutic Interventions: 1 on 1 counseling sessions, Psychoeducation, Medication administration, Evaluate responses to treatment, Monitor vital signs and CBGs as ordered, Perform/monitor CIWA, COWS, AIMS and Fall Risk screenings as ordered, Perform wound care treatments as ordered.  Evaluation of Outcomes: Progressing   LCSW Treatment Plan for Primary Diagnosis: <principal problem not specified> Long Term Goal(s): Safe transition to appropriate next level of care at discharge, Engage patient in therapeutic group addressing interpersonal concerns.  Short Term Goals: Engage patient in aftercare planning with referrals and resources and Increase skills for wellness and recovery  Therapeutic Interventions: Assess for all discharge needs, 1 to 1  time with Social worker, Explore available resources and support systems, Assess for adequacy in community support network, Educate family and significant other(s) on suicide prevention, Complete Psychosocial Assessment, Interpersonal group therapy.  Evaluation of Outcomes: Progressing   Progress in Treatment: Attending groups: No. Participating in groups: No. Taking medication as prescribed: Yes. Toleration medication: Yes. Family/Significant other contact made: No, will contact:  will contact if given consent to contact Patient understands diagnosis: No. Discussing patient identified problems/goals with staff: Yes. Medical problems stabilized or resolved: Yes. Denies suicidal/homicidal ideation: Yes. Issues/concerns per patient self-inventory: No. Other:   New problem(s) identified: No, Describe:  None  New Short Term/Long Term Goal(s): Medication stabilization, elimination of SI thoughts, and development of a comprehensive mental wellness plan.   Patient Goals:  "Pray more for kids to teach them"  Discharge Plan or Barriers: CSW will continue to follow up for appropriate referrals and possible discharge planning  Reason for Continuation of Hospitalization: Delusions  Mania Medication stabilization  Estimated Length of Stay: 2-3 days   Attendees: Patient: Jay Woods  12/09/2018   Physician: Dr. Johnn Hai, MD 12/09/2018   Nursing: Legrand Como, RN 12/09/2018   RN Care Manager: 12/09/2018   Social Worker: Ardelle Anton, LCSW 12/09/2018   Recreational Therapist:  12/09/2018   MSW Intern: Ovidio Kin 12/09/2018   Other:  12/09/2018   Other: 12/09/2018      Scribe for Treatment Team: [name], LCSW 12/09/2018 10:54 AM

## 2018-12-09 NOTE — BHH Counselor (Signed)
Adult Comprehensive Assessment  Patient ID: Jay Woods, male   DOB: 11-Mar-2000, 18 y.o.   MRN: [id number]  Information Source: Information source: Patient  Current Stressors:  Patient states their primary concerns and needs for treatment are:: "I was losing myself. 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 Family Relationships: Pt reports that he was trying to get in his mother's relationship which was causing issues but he learned he needed to stop. Financial / Lack of resources (include bankruptcy): Pt denies stressors Housing / Lack of housing: Pt denies stressors Physical health (include injuries & life threatening diseases): Pt denies stressors Social relationships: Pt denies stressors Substance abuse: Pt endorses CBD and marijuana use. Bereavement / Loss: Pt denies stressors.  Living/Environment/Situation:  Living Arrangements: Parent, Other relatives Living conditions (as described by patient or guardian): "Good. They love me" Who else lives in the home?: Mother and two sisters How long has patient lived in current situation?: "A long time" What is atmosphere in current home: Loving, Supportive, Comfortable  Family History:  Marital status: Long term relationship Long term relationship, how long?: "A while" What types of issues is patient dealing with in the relationship?: "It is complicated" What is your sexual orientation?: Heterosexual Does patient have children?: No  Childhood History:  By whom was/is the patient raised?: Mother Description of patient's relationship with caregiver when they were a child: "Really good". Patient stated that his mother taught him right from wrong and is the reason why people always say he's so respectful and has good manners. Pt states that his  mother always took him to church. Patient's description of current relationship with people who raised him/her: "Still really good" Does patient have siblings?: Yes Number of Siblings: 3 Description of patient's current relationship with siblings: "Good they love me" Did patient suffer any verbal/emotional/physical/sexual abuse as a child?: No Did patient suffer from severe childhood neglect?: No Has patient ever been sexually abused/assaulted/raped as an adolescent or adult?: No Was the patient ever a victim of a crime or a disaster?: No Witnessed domestic violence?: No Has patient been effected by domestic violence as an adult?: No  Education:  Highest grade of school patient has completed: 11th grade Currently a student?: No Learning disability?: No  Employment/Work Situation:   Employment situation: Employed 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 Where was the patient employed at that time?: With father Did You Receive Any Psychiatric Treatment/Services While in the [place]?: No Are There Guns or Other Weapons in [place]?: No  Financial Resources:   Financial resources: Income from employment Does patient have a representative payee or guardian?: No  Alcohol/Substance Abuse:   What has been your use of drugs/alcohol within the last 12 months?: CBD and Marijuana use. Pt reports smoking occassionally. Pt endorsed smoking about 2-3 times a week. If attempted suicide, did drugs/alcohol play a role in this?: No Alcohol/Substance Abuse Treatment Hx: Denies past history Has alcohol/substance abuse ever caused legal problems?: No  Social Support System:   [employer]  Support System:  Good Describe Community Support System: Family Type of faith/religion: Ephriam Knuckles How does patient's faith help to cope with current illness?: Pt reports praying makes him feel beter and God has touched his heart.  Leisure/Recreation:   Leisure and Hobbies: Soccer, spending time with his mother, sisters, and going to nice places.  Strengths/Needs:   What is the patient's perception of their strengths?: Soccer, being physical, and his faith Patient states these barriers may affect/interfere with their treatment: N/A Patient states these barriers may affect their return to the community: N/A Other important information patient would like considered in planning for their treatment: N/A  Discharge Plan:   Currently receiving community mental health services: No Patient states concerns and preferences for aftercare planning are: [facility] for medication management and therapy Patient states they will know when they are safe and ready for discharge when: "Today" Does patient have access to transportation?: Yes(uber/taxi or police (they know his grandfather)) Does patient have financial barriers related to discharge medications?: No Will patient be returning to same living situation after discharge?: Yes(home with mom and sisters)  Summary/Recommendations:   Summary and Recommendations (to be completed by the evaluator): Patient is a 18 year old male who was petitioned by mother for not taking his medications, hearing voices and acting violent towards family. Recommendations for pt include: crisis stabilization, therapeutic milieu, medication management, attend and participate in group therapy, and development of a comprehensive mental wellness plan.  Delphia Grates. 12/09/2018

## 2018-12-10 MED ORDER — NICOTINE POLACRILEX 2 MG MT GUM
2.0000 mg | CHEWING_GUM | OROMUCOSAL | Status: DC | PRN
  Administered 2018-12-11: 19:00:00 2 mg via ORAL

## 2018-12-10 NOTE — B[facility] Group Notes (Signed)
LCSW Group Therapy Note  12/10/2018   11:15am-12:00pm   Type of Therapy and Topic:  Group Therapy: Anger   Participation Level:  Active   Description of Group:   In this group, patients identified a recent time they became angry and how they reacted.  They analyzed how their reaction was possibly beneficial and how it was possibly unhelpful.  They learned how to recognize the physical responses they have to anger-provoking situations that can alert them to the possibility that they are becoming angry.  They also discussed how anger often arises from conflict with our value system, which is different from one person to another.  Therapeutic Goals: 1. Patients will remember their last incident of anger and how they felt emotionally and physically. 2. Patients will identify how their behavior at that time worked for them, as well as how it worked against them. 3. Patients will explore the idea that people's value systems differ and can affect anger 4. 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  "I ignored them in order to not have an argument."  He digressed into a completely unrelated topic of food curing every illness on earth.  He laughed inappropriately at times and said he was "so incredibly happy today."    Therapeutic Modalities:   Cognitive Behavioral Therapy  Maretta Los, LCSW

## 2018-12-10 NOTE — Plan of Care (Signed)
  Problem: Activity: Goal: Interest or engagement in activities will improve Outcome: Progressing   Problem: Coping: Goal: Ability to verbalize frustrations and anger appropriately will improve Outcome: Progressing Goal: Ability to demonstrate self-control will improve Outcome: Progressing   Problem: Safety: Goal: Periods of time without injury will increase Outcome: Progressing   

## 2018-12-10 NOTE — Progress Notes (Signed)
Patient ID: Vibhav Waddill, male   DOB: August 25, 2000, 18 y.o.   MRN: [id number]  [place] NOVEL CORONAVIRUS (COVID-19) DAILY CHECK-OFF SYMPTOMS - answer yes or no to each - every day NO YES  Have you had a fever in the past 24 hours?  . Fever (Temp > 37.80C / 100F) X   Have you had any of these symptoms in the past 24 hours? . New Cough .  Sore Throat  .  Shortness of Breath .  Difficulty Breathing .  Unexplained Body Aches   X   Have you had any one of these symptoms in the past 24 hours not related to allergies?   . Runny Nose .  Nasal Congestion .  Sneezing   X   If you have had runny nose, nasal congestion, sneezing in the past 24 hours, has it worsened?  X   EXPOSURES - check yes or no X   Have you traveled outside the state in the past 14 days?  X   Have you been in contact with someone with a confirmed diagnosis of COVID-19 or PUI in the past 14 days without wearing appropriate PPE?  X   Have you been living in the same home as a person with confirmed diagnosis of COVID-19 or a PUI (household contact)?    X   Have you been diagnosed with COVID-19?    X              What to do next: Answered NO to all: Answered YES to anything:   Proceed with unit schedule Follow the [facility] Inpatient Flowsheet.

## 2018-12-10 NOTE — Progress Notes (Signed)
Adult Psychoeducational Group Note  Date:  12/10/2018 Time:  1:10 AM  Group Topic/Focus:  Wrap-Up Group:   The focus of this group is to help patients review their daily goal of treatment and discuss progress on daily workbooks.  Participation Level:  Active  Participation Quality:  Appropriate  Affect:  Anxious  Cognitive:  Appropriate  Insight: Appropriate  Engagement in Group:  Developing/Improving  Modes of Intervention:  Discussion  Additional Comments: Pt stated his goal for today was to focus on his treatment plan. Pt stated he felt he accomplished his goal today. Pt stated his relationship with his family had improved since he was admitted here. Pt stated been able to contact his brother help improve his day. Pt stated he felt about the same about himself today. Pt rated his overall day a 10. Pt stated his appetite was improved today. Pt stated his goal for tonight is to get some rest. Pt did not complain of any pain tonight. Pt stated he was not hearing voices or seeing anything that was not there.  Pt stated he had no thoughts of harming himself or others. Pt stated he would alert staff if anything changes.    Candy Sledge 12/10/2018, 1:10 AM

## 2018-12-10 NOTE — Progress Notes (Signed)
[place] Group Notes:  (Nursing/MHT/Case Management/Adjunct)  Date:  12/10/2018  Time:  0900   Type of Therapy:  Nurse Education  Participation Level:  Active  Participation Quality:  Appropriate  Affect:  Appropriate  Cognitive:  Appropriate  Insight:  Appropriate  Engagement in Group:  Improving  Modes of Intervention:  Activity, Discussion and Education  Summary of Progress/Problems: Discussed anger management.

## 2018-12-10 NOTE — Progress Notes (Signed)
Patient has been up in the dayroom watching tv interacting with select peers. He c/o his arm hurting where he had blood drawn and received tylenol for pain. He was compliant with his scheduled medication, pleasant and cooperative. Safety maintained with 15 min checks.
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
## 2018-12-11 NOTE — Progress Notes (Signed)
   12/11/18 0800  Psych Admission Type (Psych Patients Only)  Admission Status Involuntary  Psychosocial Assessment  Patient Complaints None  Eye Contact Fair  Facial Expression Anxious  Affect Anxious  Speech Logical/coherent;Rapid  Interaction Assertive  Motor Activity Fidgety  Appearance/Hygiene Unremarkable  Behavior Characteristics Appropriate to situation  Mood Anxious  Aggressive Behavior  Effect No apparent injury  Thought Process  Coherency Disorganized;Flight of ideas;Loose associations  Content WDL  Delusions None reported or observed  Perception WDL  Hallucination None reported or observed  Judgment WDL  Confusion None  Danger to Self  Current suicidal ideation? Denies  Danger to Others  Danger to Others None reported or observed

## 2018-12-11 NOTE — [facility] Group Notes (Signed)
[facility] LCSW Group Therapy Note  Date/Time:  12/11/2018  11:00AM-12:00PM  Type of Therapy and Topic:  Group Therapy:  Music and Mood  Participation Level:  Active   Description of Group: In this process group, members listened to a variety of genres of music and identified that different types of music evoke different responses.  Patients were encouraged to identify music that was soothing for them and music that was energizing for them.  Patients discussed how this knowledge can help with wellness and recovery in various ways including managing depression and anxiety as well as encouraging healthy sleep habits.    Therapeutic Goals: 1. Patients will explore the impact of different varieties of music on mood 2. Patients will verbalize the thoughts they have when listening to different types of music 3. Patients will identify music that is soothing to them as well as music that is energizing to them 4. Patients will discuss how to use this knowledge to assist in maintaining wellness and recovery 5. Patients will explore the use of music as a coping skill  Summary of Patient Progress:  At the beginning of group, patient expressed that he felt blessed and happy.  At the end of group, patient expressed that he felt relaxed and passionate about what he loves.  He participated fully throughout group.    Therapeutic Modalities: Solution Focused Brief Therapy Activity   Selmer Dominion, LCSW

## 2018-12-11 NOTE — Progress Notes (Signed)
Adult Psychoeducational Group Note  Date:  12/11/2018 Time:  2:12 AM  Group Topic/Focus:  Wrap-Up Group:   The focus of this group is to help patients review their daily goal of treatment and discuss progress on daily workbooks.  Participation Level:  Active  Participation Quality:  Appropriate  Affect:  Anxious, Appropriate and Excited  Cognitive:  Appropriate  Insight: Appropriate and Good  Engagement in Group:  Developing/Improving  Modes of Intervention:  Discussion  Additional Comments: Pt stated his goal for today was to focus on his treatment plan. Pt stated he felt he accomplished his goal today. Pt stated his relationship with his family had improved since he was admitted here. Pt stated been able to contact his brother help improve his day. Pt stated he felt better about himself today. Pt rated his overall day a 10. Pt stated his appetite was improved today. Pt stated his goal for tonight is to get some rest. Pt did complain of having pain in his right arm tonight. Pt stated this pain in his right arm was cause by getting a shot at the ER. Pt's nurse was made aware of the situation.  Pt stated he was not hearing voices or seeing anything that was not there.  Pt stated he had no thoughts of harming himself or others. Pt stated he would alert staff if anything changes   Candy Sledge 12/11/2018, 2:12 AM
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
## 2018-12-11 NOTE — Progress Notes (Signed)
Adult Psychoeducational Group Note  Date:  12/11/2018 Time:  11:45 PM  Group Topic/Focus:  Wrap-Up Group:   The focus of this group is to help patients review their daily goal of treatment and discuss progress on daily workbooks.  Participation Level:  Active  Participation Quality:  Appropriate  Affect:  Anxious, Appropriate and Excited  Cognitive:  Appropriate  Insight: Appropriate  Engagement in Group:  Developing/Improving and Engaged  Modes of Intervention:  Discussion  Additional Comments:  Pt stated his goal for today was to focus on his treatment plan. Pt stated he felt he accomplished his goal today. Pt stated his relationship with his family had improved since he was admitted here. Pt stated been able to contact his girl friend and sister help improve his day. Pt stated he felt better about himself today. Pt rated his overall day a 10. Pt stated his appetite was improved today. Pt stated his goal for tonight is to get some sleep. Pt did not complain of any pain tonight. Pt stated he was not hearing voices or seeing anything that was not there.  Pt stated he had no thoughts of harming himself or others. Pt stated he would alert staff if anything changes.      Candy Sledge 12/11/2018, 11:45 PM

## 2018-12-12 MED ORDER — PALIPERIDONE PALMITATE ER 234 MG/1.5ML IM SUSY
234.0000 mg | PREFILLED_SYRINGE | Freq: Once | INTRAMUSCULAR | Status: AC
  Administered 2018-12-12: 16:00:00 234 mg via INTRAMUSCULAR
  Filled 2018-12-12: qty 1.5

## 2018-12-12 NOTE — Progress Notes (Signed)
The patient rated his day as a 10 out of a possible 10. He states that he will be discharged tomorrow and that he feels supported by his peers.

## 2018-12-12 NOTE — Progress Notes (Signed)
Recreation Therapy Notes  Date: 10.19.20 Time: 1000 Location: 500 Hall Dayroom  Group Topic: Coping Skills  Goal Area(s) Addresses:  Patient will identify positive coping skills. Patient will identify benefit of using coping skills post d/c.  Behavioral Response: Engaged  Intervention: [employer], dry erase marker, worksheet, pencils  Activity: Mind Map.  Patients were given a blank mind map.  LRT and patients filled in the first 8 boxes together (arguments, road rage, solicitors, family, work, finances, weather and relationships). Patients were given time to come up with at least 3 coping skills for the areas identified.  LRT would then write the coping skills on the board so patients could fill in any blank spaces they may have had on their mind map.    Education: Radiographer, therapeutic, Dentist.   Education Outcome: Acknowledges understanding/In group clarification offered/Needs additional education.   Clinical Observations/Feedback: Pt was active and attentive in group session.  Pt identified coping skills as reading; video games, set a schedule for the day; church/faith, everyone is equal; helping people; proper language; and faith/loyalty, don't dwell on the small stuff.    Victorino Sparrow, LRT/CTRS    Ria Comment, Christabel Camire A 12/12/2018 11:18 AM

## 2018-12-12 NOTE — Progress Notes (Signed)
Patient has been up in the dayroom watching tv. He has been very gracious and thankful to staff tonight. He reports that he has spoken with his brother but not his mom because she spends time with her boyfriend and he reports not wanting to bother her. He reports that his mom was right and did not elaborate on why he feels this way. He did report that he needs to stop smoking weed and take his medicines because he does not know where he would be without God. Support given and safety maintained with 15 min check.

## 2018-12-12 NOTE — [facility] Group Notes (Signed)
[place] LCSW Group Therapy Note   Date and Time: 12/12/2018 @ 1:30pm  Type of Therapy and Topic:  Group Therapy:  Trust and Honesty    Participation Level:  [facility] PARTICIPATION LEVEL: Active   Mood: Pleasant    Description of Group:     In this group patients will be asked to explore the value of being honest.  Patients will be guided to discuss their thoughts, feelings, and behaviors related to honesty and trusting in others. Patients will process together how trust and honesty relate to forming relationships with peers, family members, and self. Each patient will be challenged to identify and express feelings of being vulnerable. Patients will discuss reasons why people are dishonest and identify alternative outcomes if one was truthful (to self or others). This group will be process-oriented, with patients participating in exploration of their own experiences, giving and receiving support, and processing challenge from other group members.     Therapeutic Goals:  1.  Patient will identify why honesty is important to relationships and how honesty overall affects relationships.  2.  Patient will identify a situation where they lied or were lied too and the  feelings, thought process, and behaviors surrounding the situation  3.  Patient will identify the meaning of being vulnerable, how that feels, and how that correlates to being honest with self and others.  4.  Patient will identify situations where they could have told the truth, but instead lied and explain reasons of dishonesty.     Summary of Patient Progress:       Patient was active and engaged throughout group therapy today. Patient stated that he has felt upset and disappointed when he is lied to. Patient stated that his family stated that he was doing different kinds of drugs which was a lie. Patient stated that the lies about him makes him want to prove people wrong. Patient stated that when he gets out of the hospital  that he wants to get back into the church and be more honest and let God use him.        Therapeutic Modalities:    Cognitive Behavioral Therapy  Solution Focused Therapy  Motivational Interviewing  Brief Therapy   Ardelle Anton, MSW, LCSW

## 2018-12-12 NOTE — Progress Notes (Signed)
D: Pt denies SI/HI/AVH. Pt is pleasant and cooperative. Pt stated he had good day.  A: Pt was offered support and encouragement. Pt was given scheduled medications. Pt was encourage to attend groups. Q 15 minute checks were done for safety.   R:Pt attends groups and interacts well with peers and staff. Pt is taking medication. Pt has no complaints.Pt receptive to treatment and safety maintained on unit.

## 2018-12-12 NOTE — Progress Notes (Signed)
DAR NOTE: Patient presents with a calm affect and mood.  Denies suicidal thoughts, auditory and visual hallucinations.  Kirt Boys given IM.  No adverse reaction noted.  Rates depression at 0, hopelessness at 0, and anxiety at 0.  Maintained on routine safety checks.  Medications given as prescribed.  Support and encouragement offered as needed.  Attended group and participated.  States goal for today is "discharge."  Patient observed socializing with peers in the dayroom.  Offered no complaint.

## 2018-12-12 NOTE — Progress Notes (Signed)
[facility] MD Progress Note  12/12/2018 2:18 PM Jay Woods  MRN:  [id number] Subjective:   Patient again very focused on discharge states he is feeling better, he is generally cordial with staff he has hypomanic still but not fully euthymic Compliant with medications, no EPS or TD Denies auditory or visual hallucinations Principal Problem: Jay Woods is an 18 y.o. male presenting under IVC, petitioned by mother Jfor not taking his medications, hearing voices and acting violent towards family. Per [employer], they have received plenty of calls about this patient but he's always running when they arrive.  Diagnosis: Active Problems:   Psychotic disorder ([place])  Total Time spent with patient: 20 minutes  Past Psychiatric History: see eval  Past Medical History:  Past Medical History:  Diagnosis Date  . Eczema    History reviewed. No pertinent surgical history. Family History: History reviewed. No pertinent family history. Family Psychiatric  History: see eval Social History:  Social History   Substance and Sexual Activity  Alcohol Use Yes     Social History   Substance and Sexual Activity  Drug Use Yes  . Types: Marijuana    Social History   Socioeconomic History  . Marital status: Single    Spouse name: Not on file  . Number of children: Not on file  . Years of education: Not on file  . Highest education level: Not on file  Occupational History  . Not on file  Social Needs  . Financial resource strain: Not on file  . Food insecurity    Worry: Not on file    Inability: Not on file  . Transportation needs    Medical: Not on file    Non-medical: Not on file  Tobacco Use  . Smoking status: Current Every Day Smoker    Packs/day: 1.00    Years: 5.00    Pack years: 5.00    Types: Cigarettes  . Smokeless tobacco: Former Network engineer and Sexual Activity  . Alcohol use: Yes  . Drug use: Yes    Types: Marijuana  . Sexual activity: Not Currently  Lifestyle   . Physical activity    Days per week: Not on file    Minutes per session: Not on file  . Stress: Not on file  Relationships  . Social Herbalist on phone: Not on file    Gets together: Not on file    Attends religious service: Not on file    Active member of club or organization: Not on file    Attends meetings of clubs or organizations: Not on file    Relationship status: Not on file  Other Topics Concern  . Not on file  Social History Narrative  . Not on file   Additional Social History:                         Sleep: Fair  Appetite:  Fair  Current Medications: Current Facility-Administered Medications  Medication Dose Route Frequency Provider Last Rate Last Dose  . acetaminophen (TYLENOL) tablet 650 mg  650 mg Oral Q6H PRN Deloria Lair, NP   650 mg at 12/07/18 2123  . benztropine (COGENTIN) tablet 1 mg  1 mg Oral BID Johnn Hai, MD   1 mg at 12/12/18 0745  . divalproex (DEPAKOTE) DR tablet 500 mg  500 mg Oral Q8H Johnn Hai, MD   500 mg at 12/12/18 3086  . haloperidol (HALDOL) tablet 5 mg  5 mg Oral Q6H PRN Malvin Johns, MD       Or  . haloperidol lactate (HALDOL) injection 10 mg  10 mg Intramuscular Q6H PRN Malvin Johns, MD      . hydrOXYzine (ATARAX/VISTARIL) tablet 25 mg  25 mg Oral Q6H PRN Jearld Lesch, NP      . LORazepam (ATIVAN) tablet 2 mg  2 mg Oral Q6H PRN Malvin Johns, MD       Or  . LORazepam (ATIVAN) injection 2 mg  2 mg Intramuscular Q6H PRN Malvin Johns, MD      . nicotine polacrilex (NICORETTE) gum 2 mg  2 mg Oral PRN Malvin Johns, MD   2 mg at 12/11/18 1910  . risperiDONE (RISPERDAL) tablet 4 mg  4 mg Oral BID Malvin Johns, MD   4 mg at 12/12/18 0745  . temazepam (RESTORIL) capsule 30 mg  30 mg Oral QHS Malvin Johns, MD   30 mg at 12/11/18 2122  . traZODone (DESYREL) tablet 50 mg  50 mg Oral QHS PRN,MR X 1 Dixon, Rashaun M, NP        Lab Results: No results found for this or any previous visit (from the past 48  hour(s)).  Blood Alcohol level:  Lab Results  Component Value Date   ETH <10 12/06/2018    Metabolic Disorder Labs: Lab Results  Component Value Date   HGBA1C 5.1 02/28/2017   MPG 99.67 02/28/2017   No results found for: PROLACTIN Lab Results  Component Value Date   CHOL 152 02/28/2017   TRIG 64 02/28/2017   HDL 41 02/28/2017   CHOLHDL 3.7 02/28/2017   VLDL 13 02/28/2017   LDLCALC 98 02/28/2017    Physical Findings: AIMS: Facial and Oral Movements Muscles of Facial Expression: None, normal Lips and Perioral Area: None, normal Jaw: None, normal Tongue: None, normal,Extremity Movements Upper (arms, wrists, hands, fingers): None, normal Lower (legs, knees, ankles, toes): None, normal, Trunk Movements Neck, shoulders, hips: None, normal, Overall Severity Severity of abnormal movements (highest score from questions above): None, normal Incapacitation due to abnormal movements: None, normal Patient's awareness of abnormal movements (rate only patient's report): No Awareness, Dental Status Current problems with teeth and/or dentures?: No Does patient usually wear dentures?: No  CIWA:  CIWA-Ar Total: 0 COWS:  COWS Total Score: 0  Musculoskeletal: Strength & Muscle Tone: within normal limits Gait & Station: normal Patient leans: N/A  Psychiatric Specialty Exam: Physical Exam  ROS  Blood pressure 133/67, pulse (!) 126, temperature 98 F (36.7 C), temperature source Oral, resp. rate 18.There is no height or weight on file to calculate BMI.  General Appearance: Casual  Eye Contact:  Fair  Speech:  Clear and Coherent  Volume:  Normal  Mood:  Hypomanic  Affect:  Congruent  Thought Process:  Irrelevant and Descriptions of Associations: Loose  Orientation:  Full (Time, Place, and Person)  Thought Content:  Generally rapid content nondelusional as best we can tell  Suicidal Thoughts:  No  Homicidal Thoughts:  No  Memory:  Immediate;   Fair Recent;   Fair Remote;   Fair   Judgement:  Fair  Insight:  Fair  Psychomotor Activity:  Normal  Concentration:  Concentration: Fair and Attention Span: Fair  Recall:  [employer] of Knowledge:  Good  Language:  Fair  Akathisia:  Negative  Handed:  Right  AIMS (if indicated):     Assets:  Communication Skills Desire for Improvement  ADL's:  Intact  Cognition:  WNL  Sleep:  Number of Hours: 6.5     Treatment Plan Summary: Daily contact with patient to assess and evaluate symptoms and progress in treatment and Medication management  Again patient doing better but not fully baseline and the main concern of course is longer-term compliance and stability long-acting injectable discussed will administer, further, has a number listed in the chart for significant other but this is a nonaccurate number of the individual on the phone was not connected with this patient  Malvin JohnsFARAH,Deonna Krummel, MD 12/12/2018, 2:18 PM

## 2018-12-13 MED ORDER — RISPERIDONE 4 MG PO TABS: 4.0000 mg | ORAL_TABLET | Freq: Two times a day (BID) | ORAL | 2 refills | Status: DC

## 2018-12-13 MED ORDER — INVEGA SUSTENNA 156 MG/ML IM SUSY: 156.0000 mg | PREFILLED_SYRINGE | Freq: Once | INTRAMUSCULAR | 0 refills | Status: DC

## 2018-12-13 MED ORDER — PALIPERIDONE PALMITATE ER 156 MG/ML IM SUSY: 156.0000 mg | PREFILLED_SYRINGE | Freq: Once | INTRAMUSCULAR | Status: DC

## 2018-12-13 MED ORDER — BENZTROPINE MESYLATE 1 MG PO TABS: 1.0000 mg | ORAL_TABLET | Freq: Two times a day (BID) | ORAL | 2 refills | Status: DC

## 2018-12-13 MED ORDER — DIVALPROEX SODIUM 500 MG PO DR TAB: DELAYED_RELEASE_TABLET | ORAL | 2 refills | Status: DC

## 2018-12-13 MED ORDER — DIVALPROEX SODIUM 500 MG PO DR TAB
500.0000 mg | DELAYED_RELEASE_TABLET | Freq: Every day | ORAL | Status: DC
  Filled 2018-12-13 (×2): qty 21

## 2018-12-13 MED ORDER — TEMAZEPAM 30 MG PO CAPS: 30.0000 mg | ORAL_CAPSULE | Freq: Every day | ORAL | 0 refills | Status: DC

## 2018-12-13 NOTE — [facility] Suicide Risk Assessment (Signed)
[facility] INPATIENT:  Family/Significant Other Suicide Prevention Education  Suicide Prevention Education:  Contact Attempts: Pt's mother, Jay Woods and pt's father, Jay Woods, has been identified by the patient as the family member/significant other with whom the patient will be residing, and identified as the person(s) who will aid the patient in the event of a mental health crisis.  With written consent from the patient, two attempts were made to provide suicide prevention education, prior to and/or following the patient's discharge.  We were unsuccessful in providing suicide prevention education.  A suicide education pamphlet was given to the patient to share with family/significant other.  Date and time of first attempt: 12/13/2018 @ 9:00am Date and time of second attempt: 12/13/2018 @ 9:20am  [name] 12/13/2018, 9:20 AM
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
## 2018-12-13 NOTE — [facility] Suicide Risk Assessment (Signed)
[facility] Discharge Suicide Risk Assessment   Principal Problem: Bipolar manic with psychosis Discharge Diagnoses: Active Problems:   Psychotic disorder ([place])   Total Time spent with patient: 45 minutes  Musculoskeletal: Strength & Muscle Tone: within normal limits Gait & Station: normal Patient leans: N/A  Psychiatric Specialty Exam: ROS  Blood pressure (!) 141/78, pulse (!) 115, temperature (!) 97.4 F (36.3 C), temperature source Oral, resp. rate 18.There is no height or weight on file to calculate BMI.  General Appearance: Casual  Eye Contact::  Good  Speech:  Clear and Coherent409  Volume:  Normal  Mood:  Euthymic  Affect:  Full Range  Thought Process:  Coherent and Descriptions of Associations: Circumstantial  Orientation:  Full (Time, Place, and Person)  Thought Content:  Tangential  Suicidal Thoughts:  No  Homicidal Thoughts:  No  Memory:  Immediate;   Fair Recent;   Fair Remote;   Fair  Judgement:  Fair  Insight:  Fair  Psychomotor Activity:  Normal  Concentration:  Fair  Recall:  [employer] of Knowledge:Fair  Language: Fair  Akathisia:  Negative  Handed:  Right  AIMS (if indicated):     Assets:  Communication Skills Physical Health Resilience  Sleep:  Number of Hours: 6.5  Cognition: WNL  ADL's:  Intact   Mental Status Per Nursing Assessment::   On Admission:  NA  Demographic Factors:  Male  Loss Factors: Decrease in vocational status  Historical Factors: NA  Risk Reduction Factors:   Positive social support  Continued Clinical Symptoms:  Previous Psychiatric Diagnoses and Treatments  Cognitive Features That Contribute To Risk:  Loss of executive function    Suicide Risk:  Minimal: No identifiable suicidal ideation.  Patients presenting with no risk factors but with morbid ruminations; may be classified as minimal risk based on the severity of the depressive symptoms  Follow-up Information    [facility] Follow up on 12/16/2018.   Why:  Hospital follow up appointment is Friday, 10/23 at 10:00a.  The provider will contact you.  Contact information: [address], [place], [place] [address]  P: [phone] F: [phone]          Plan Of Care/Follow-up recommendations:  Activity:  full  Isa Kohlenberg, MD 12/13/2018, 8:51 AM

## 2018-12-13 NOTE — Progress Notes (Signed)
  [facility] Adult Case Management Discharge Plan :  Will you be returning to the same living situation after discharge:  Yes,  home At discharge, do you have transportation home?: Yes,  bus Do you have the ability to pay for your medications: No.; [facility]  Release of information consent forms completed and in the chart;  Patient's signature needed at discharge.  Patient to Follow up at: Follow-up Information    [facility] Follow up on 12/16/2018.   Why: Hospital follow up appointment is Friday, 10/23 at 10:00a.  The provider will contact you.  Contact information: [address], [place], [place] [address]  P: [phone] F: [phone]          Next level of care provider has access to [facility] and Suicide Prevention discussed: No.; CSW attempted twice  Have you used any form of tobacco in the last 30 days? (Cigarettes, Smokeless Tobacco, Cigars, and/or Pipes): Yes  Has patient been referred to the Quitline?: Patient refused referral  Patient has been referred for addiction treatment: Yes  [name], LCSW 12/13/2018, 9:23 AM

## 2018-12-13 NOTE — Progress Notes (Signed)
Pt discharged to lobby. Pt was stable and appreciative at that time. All papers, samples and prescriptions were given and valuables returned. Verbal understanding expressed. Denies SI/HI and A/VH. Pt given opportunity to express concerns and ask questions.  

## 2019-10-13 ENCOUNTER — Other Ambulatory Visit: Payer: Self-pay

## 2019-10-13 ENCOUNTER — Emergency Department (HOSPITAL_COMMUNITY): Payer: Medicaid Other

## 2019-10-13 ENCOUNTER — Emergency Department (HOSPITAL_COMMUNITY)
Admission: EM | Admit: 2019-10-13 | Discharge: 2019-10-13 | Disposition: A | Discharge status: Home / Self Care | Payer: Medicaid Other | Attending: Emergency Medicine | Admitting: Emergency Medicine

## 2019-10-13 ENCOUNTER — Encounter (HOSPITAL_COMMUNITY): Payer: Self-pay | Admitting: Pediatrics

## 2019-10-13 DIAGNOSIS — Z79899 Other long term (current) drug therapy: Secondary | ICD-10-CM | POA: Insufficient documentation

## 2019-10-13 DIAGNOSIS — T63441A Toxic effect of venom of bees, accidental (unintentional), initial encounter: Secondary | ICD-10-CM | POA: Insufficient documentation

## 2019-10-13 DIAGNOSIS — F17210 Nicotine dependence, cigarettes, uncomplicated: Secondary | ICD-10-CM | POA: Insufficient documentation

## 2019-10-13 LAB — CBC WITH DIFFERENTIAL/PLATELET
Abs Immature Granulocytes: 0.02 10*3/uL (ref 0.00–0.07)
Basophils Absolute: 0.1 10*3/uL (ref 0.0–0.1)
Basophils Relative: 1 %
Eosinophils Absolute: 0.6 10*3/uL — ABNORMAL HIGH (ref 0.0–0.5)
Eosinophils Relative: 6 %
HCT: 46.6 % (ref 39.0–52.0)
Hemoglobin: 14.9 g/dL (ref 13.0–17.0)
Immature Granulocytes: 0 %
Lymphocytes Relative: 23 %
Lymphs Abs: 2.3 10*3/uL (ref 0.7–4.0)
MCH: 30 pg (ref 26.0–34.0)
MCHC: 32 g/dL (ref 30.0–36.0)
MCV: 93.8 fL (ref 80.0–100.0)
Monocytes Absolute: 0.7 10*3/uL (ref 0.1–1.0)
Monocytes Relative: 7 %
Neutro Abs: 6 10*3/uL (ref 1.7–7.7)
Neutrophils Relative %: 63 %
Platelets: 312 10*3/uL (ref 150–400)
RBC: 4.97 MIL/uL (ref 4.22–5.81)
RDW: 12.6 % (ref 11.5–15.5)
WBC: 9.6 10*3/uL (ref 4.0–10.5)
nRBC: 0 % (ref 0.0–0.2)

## 2019-10-13 LAB — COMPREHENSIVE METABOLIC PANEL
ALT: 27 U/L (ref 0–44)
AST: 21 U/L (ref 15–41)
Albumin: 4.7 g/dL (ref 3.5–5.0)
Alkaline Phosphatase: 51 U/L (ref 38–126)
Anion gap: 12 (ref 5–15)
BUN: 12 mg/dL (ref 6–20)
CO2: 21 mmol/L — ABNORMAL LOW (ref 22–32)
Calcium: 9.4 mg/dL (ref 8.9–10.3)
Chloride: 106 mmol/L (ref 98–111)
Creatinine, Ser: 1.09 mg/dL (ref 0.61–1.24)
GFR calc Af Amer: >60 mL/min (ref >60)
GFR calc non Af Amer: >60 mL/min (ref >60)
Glucose, Bld: 96 mg/dL (ref 70–99)
Potassium: 4.3 mmol/L (ref 3.5–5.1)
Sodium: 139 mmol/L (ref 135–145)
Total Bilirubin: 0.6 mg/dL (ref 0.3–1.2)
Total Protein: 8 g/dL (ref 6.5–8.1)

## 2019-10-13 MED ORDER — IBUPROFEN 400 MG PO TABS
400.0000 mg | ORAL_TABLET | Freq: Once | ORAL | Status: AC | PRN
  Administered 2019-10-13: 400 mg via ORAL
  Filled 2019-10-13: qty 1

## 2019-10-13 MED ORDER — FAMOTIDINE 20 MG PO TABS: 20.0000 mg | ORAL_TABLET | Freq: Every day | ORAL | 0 refills | Status: DC

## 2019-10-13 MED ORDER — DIPHENHYDRAMINE HCL 25 MG PO CAPS
25.0000 mg | ORAL_CAPSULE | Freq: Once | ORAL | Status: AC
  Administered 2019-10-13: 25 mg via ORAL
  Filled 2019-10-13: qty 1

## 2019-10-13 MED ORDER — FAMOTIDINE 20 MG PO TABS
20.0000 mg | ORAL_TABLET | Freq: Once | ORAL | Status: AC
  Administered 2019-10-13: 20 mg via ORAL
  Filled 2019-10-13: qty 1

## 2019-10-13 NOTE — ED Notes (Signed)
Pt upset b/c Pt's hand is getting more red and swollen. Swelling is noted on to Pt's hand not arm at this time.

## 2019-10-13 NOTE — ED Notes (Signed)
E-signature pad unavailable at time of pt discharge. Discharge information discussed with pt. All pt questions answered. Pt stated understanding of discharge material.

## 2019-10-13 NOTE — ED Provider Notes (Signed)
[facility] EMERGENCY DEPARTMENT Provider Note   CSN: [id number] Arrival date & time: 10/13/19  8786     History Chief Complaint  Patient presents with  . Arm Swelling    Jay Woods is a 19 y.o. male.  HPI   Patient with significant medical history of psychotic disorder, substance abuse disorder presents to the emergency department with chief complaint of right hand pain after he was stung by a wasp 2 times on Wednesday.  Patient states he has had increased hand swelling and pain over the last 2 days.  He denies having history of anaphylactic shock to bee stings, does not have the EpiPen, denies swelling of the tongue or throat, difficulty swallowing his own saliva, increased rash, shortness of breath or chest pain.  He admits that he took Augmentin that his mother gave him and states that it did not help with the pain.  He has been icing which seems to help.  He denies fever, chills, shortness of breath, chest pain, abdominal pain, nausea, vomiting, diarrhea, pedal edema.  Past Medical History:  Diagnosis Date  . Eczema     Patient Active Problem List   Diagnosis Date Noted  . Psychotic disorder (HCC) 12/07/2018  . Substance induced mood disorder (HCC) 02/28/2017  . Intermittent explosive disorder 02/27/2017  . 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 Intermittent explosive disorder in adult 02/27/2017    History reviewed. No pertinent surgical history.     No family history on file.  Social History   Tobacco Use  . Smoking status: Current Every Day Smoker    Packs/day: 1.00    Years: 5.00    Pack years: 5.00    Types: Cigarettes  . Smokeless tobacco: Former Clinical biochemist  . Vaping Use: Some days  . Substances: CBD  Substance Use Topics  . Alcohol use: Yes  . Drug use: Yes    Types: Marijuana    Home Medications Prior to Admission medications   Medication Sig Start Date End Date Taking? Authorizing  Provider  benztropine (COGENTIN) 1 MG tablet Take 1 tablet (1 mg total) by mouth 2 (two) times daily. 12/13/18   Malvin Johns, MD  divalproex (DEPAKOTE) 500 MG DR tablet 1 in am 2 at hs 12/13/18   Malvin Johns, MD  famotidine (PEPCID) 20 MG tablet Take 1 tablet (20 mg total) by mouth daily for 14 days. 10/13/19 10/27/19  Carroll Sage, PA-C  paliperidone (INVEGA SUSTENNA) 156 MG/ML SUSY injection Inject 1 mL (156 mg total) into the muscle once for 1 dose. Due 10/23 12/13/18 12/13/18  Malvin Johns, MD  risperiDONE (RISPERDAL) 4 MG tablet Take 1 tablet (4 mg total) by mouth 2 (two) times daily. 12/13/18   Malvin Johns, MD  temazepam (RESTORIL) 30 MG capsule Take 1 capsule (30 mg total) by mouth at bedtime. 12/13/18   Malvin Johns, MD    Allergies    Patient has no known allergies.  Review of Systems   Review of Systems  Constitutional: Negative for chills and fever.  HENT: Negative for congestion, sore throat, tinnitus and trouble swallowing.   Eyes: Negative for visual disturbance.  Respiratory: Negative for cough and shortness of breath.   Cardiovascular: Negative for chest pain.  Gastrointestinal: Negative for abdominal pain, diarrhea, nausea and vomiting.  Genitourinary: Negative for enuresis, flank pain and frequency.  Musculoskeletal: Negative for back pain.       Admits to right hand pain.  Skin: Negative for  rash.  Neurological: Negative for dizziness, light-headedness and headaches.  Hematological: Does not bruise/bleed easily.    Physical Exam Updated Vital Signs BP 132/79 (BP Location: Left Arm)   Pulse 72   Temp 98.8 F (37.1 C) (Oral)   Resp 16   Ht 5\' 9"  (1.753 m)   SpO2 99%   BMI 25.84 kg/m   Physical Exam Vitals and nursing note reviewed.  Constitutional:      General: He is not in acute distress.    Appearance: He is not ill-appearing.  HENT:     Head: Normocephalic and atraumatic.     Nose: No congestion.     Mouth/Throat:     Mouth: Mucous  membranes are moist.     Pharynx: Oropharynx is clear.  Eyes:     General: No scleral icterus. Cardiovascular:     Rate and Rhythm: Normal rate and regular rhythm.     Pulses: Normal pulses.     Heart sounds: No murmur heard.  No friction rub. No gallop.   Pulmonary:     Effort: No respiratory distress.     Breath sounds: No wheezing, rhonchi or rales.  Abdominal:     General: There is no distension.     Tenderness: There is no abdominal tenderness. There is no guarding.  Musculoskeletal:        General: Swelling, tenderness and signs of injury present.     Right lower leg: No edema.     Left lower leg: No edema.     Comments: Patient's right hand was visualized, edema noted on the dorsal aspect of the patient's hand, small papule noted on the third digit on the middle joint on the dorsal side of the hand.  Hand was nontender to palpation, patient had full range of motion in his proximal, middle, distal joints, neurovascular fully intact.  Skin:    General: Skin is warm and dry.     Findings: No rash.  Neurological:     Mental Status: He is alert.  Psychiatric:        Mood and Affect: Mood normal.     ED Results / Procedures / Treatments   Labs (all labs ordered are listed, but only abnormal results are displayed) Labs Reviewed  CBC WITH DIFFERENTIAL/PLATELET - Abnormal; Notable for the following components:      Result Value   Eosinophils Absolute 0.6 (*)    All other components within normal limits  COMPREHENSIVE METABOLIC PANEL - Abnormal; Notable for the following components:   CO2 21 (*)    All other components within normal limits    EKG None  Radiology DG Hand 2 View Right  Result Date: 10/13/2019 CLINICAL DATA:  Right hand swelling and redness after wasp sting EXAM: RIGHT HAND - 2 VIEW COMPARISON:  10/29/2017 FINDINGS: There is no evidence of fracture or dislocation. There is no evidence of arthropathy or other focal bone abnormality. Soft tissue swelling over  the dorsum of the hand. No radiopaque foreign body. IMPRESSION: No acute osseous abnormality. Soft tissue swelling over the dorsum of the hand. Electronically Signed   By: 12/29/2017 D.O.   On: 10/13/2019 10:22    Procedures Procedures (including critical care time)  Medications Ordered in ED Medications  ibuprofen (ADVIL) tablet 400 mg (400 mg Oral Given 10/13/19 1001)    ED Course  I have reviewed the triage vital signs and the nursing notes.  Pertinent labs & imaging results that were available during my care of  the patient were reviewed by me and considered in my medical decision making (see chart for details).    MDM Rules/Calculators/A&P                          I have personally reviewed all imaging, labs and have interpreted them.  Patient presents with right hand swelling after being stung by a bee.  Patient was alert and oriented did not appear to be in acute distress, vital signs within normal limits.  Oropharynx was visualized, tongue and  uvula were both midline, patient was controlling his own secretions out difficulty, lung sounds were clear bilaterally no stridor or wheezing heard, full skin exam was performed no hives or skin rashes noted, right hand was visualized edema noted on the dorsal aspect of the hand, he had full range of motion at the proximal, middle, distal joints, able to flex and extend his wrist without difficulty.  Neurovascular fully intact.  CBC does not show leukocytosis or anemia.  CMP does not show electrolyte abnormality, elevated liver enzymes, AKI.  Hand x-ray was performed did not show any acute abnormalities.  Low suspicion for compartment syndrome as neurovascular was fully intact and hand was soft to the touch.  Low suspicion for cellulitis or deep tissue infection as no signs of infection noted on exam, no induration or fluctuance felt on exam.  Low suspicion for anaphylactic shock as no diffuse rash noted on patient, no evidence of airway  compromise noted on exam.  Will recommend H1, H2 blockers as well as hydrocortisone cream.  Patient appears to be resting comfortably in bed show no acute signs disstress.  Vital signs have remained stable does not meet criteria to be admitted to the hospital.  Likely patient is suffering from a reaction from the bee sting will provide H1 and H2 blockers as well as hydrocortisone cream for symptom control.  Patient was discussed with attending who agrees assessment and plan.  Patient was given at home care as well as strict return precautions.  Patient verbalized that he understood and agreed to plan.   Final Clinical Impression(s) / ED Diagnoses Final diagnoses:  Bee sting, accidental or unintentional, initial encounter    Rx / DC Orders ED Discharge Orders         Ordered    famotidine (PEPCID) 20 MG tablet  Daily        10/13/19 2101           Barnie Del 10/13/19 2120    Wynetta Fines, MD 10/13/19 2337

## 2019-10-13 NOTE — ED Triage Notes (Signed)
C/O right arm pain + swelling.Stated last Wednesday he got stung by either a bee or a wasp. Pt stated he's been taking amoxicillin given by a family member.

## 2019-10-13 NOTE — Discharge Instructions (Addendum)
You have been seen here for a bee sting on the right hand.  Lab work and imaging all look reassuring.  I want you to take Benadryl 25 to 50 mg before bedtime.  During the day I would like you to take Pepcid and Claritin for the next 2 weeks daily.  You may apply hydrocortisone cream over the bee sting as this will help decrease itchiness and swelling.  Icing can also help with swelling and pain.  Over-the-counter pain medications like ibuprofen or Tylenol every 6 hours as needed for pain. Please follow dosing on the back of bottle.  I have given you the contact information for [facility] they work with individuals with little to no insurance can help you find a primary care provider.  Please call them at your earliest convenience.  I want to come back to emergency department if you develop swelling of your tongue or throat, difficulty swallowing your own saliva, increasing rash all of your body or increased redness over your hand, chest pain, shortness of breath, uncontrolled nausea, vomiting, diarrhea as these symptoms require further evaluation and management.

## 2020-06-09 ENCOUNTER — Ambulatory Visit (HOSPITAL_COMMUNITY)
Admission: EM | Admit: 2020-06-09 | Discharge: 2020-06-10 | Disposition: A | Discharge status: Home / Self Care | Payer: No Payment, Other | Attending: Psychiatry | Admitting: Psychiatry

## 2020-06-09 ENCOUNTER — Other Ambulatory Visit: Payer: Self-pay

## 2020-06-09 ENCOUNTER — Ambulatory Visit (HOSPITAL_COMMUNITY)
Admission: RE | Admit: 2020-06-09 | Discharge: 2020-06-09 | Disposition: A | Discharge status: Home / Self Care | Payer: Self-pay | Attending: Psychiatry | Admitting: Psychiatry

## 2020-06-09 DIAGNOSIS — Z20822 Contact with and (suspected) exposure to covid-19: Secondary | ICD-10-CM | POA: Insufficient documentation

## 2020-06-09 DIAGNOSIS — F1220 Cannabis dependence, uncomplicated: Secondary | ICD-10-CM | POA: Insufficient documentation

## 2020-06-09 DIAGNOSIS — F29 Unspecified psychosis not due to a substance or known physiological condition: Secondary | ICD-10-CM | POA: Insufficient documentation

## 2020-06-09 DIAGNOSIS — F319 Bipolar disorder, unspecified: Secondary | ICD-10-CM | POA: Insufficient documentation

## 2020-06-09 LAB — COMPREHENSIVE METABOLIC PANEL
ALT: 50 U/L — ABNORMAL HIGH (ref 0–44)
AST: 45 U/L — ABNORMAL HIGH (ref 15–41)
Albumin: 5.2 g/dL — ABNORMAL HIGH (ref 3.5–5.0)
Alkaline Phosphatase: 47 U/L (ref 38–126)
Anion gap: 8 (ref 5–15)
BUN: 14 mg/dL (ref 6–20)
CO2: 29 mmol/L (ref 22–32)
Calcium: 10.1 mg/dL (ref 8.9–10.3)
Chloride: 102 mmol/L (ref 98–111)
Creatinine, Ser: 0.85 mg/dL (ref 0.61–1.24)
GFR, Estimated: >60 mL/min (ref >60)
Glucose, Bld: 78 mg/dL (ref 70–99)
Potassium: 4.4 mmol/L (ref 3.5–5.1)
Sodium: 139 mmol/L (ref 135–145)
Total Bilirubin: 0.9 mg/dL (ref 0.3–1.2)
Total Protein: 8.5 g/dL — ABNORMAL HIGH (ref 6.5–8.1)

## 2020-06-09 LAB — CBC WITH DIFFERENTIAL/PLATELET
Abs Immature Granulocytes: 0.03 10*3/uL (ref 0.00–0.07)
Basophils Absolute: 0.1 10*3/uL (ref 0.0–0.1)
Basophils Relative: 1 %
Eosinophils Absolute: 0.1 10*3/uL (ref 0.0–0.5)
Eosinophils Relative: 0 %
HCT: 47.5 % (ref 39.0–52.0)
Hemoglobin: 15.9 g/dL (ref 13.0–17.0)
Immature Granulocytes: 0 %
Lymphocytes Relative: 21 %
Lymphs Abs: 2.4 10*3/uL (ref 0.7–4.0)
MCH: 31.1 pg (ref 26.0–34.0)
MCHC: 33.5 g/dL (ref 30.0–36.0)
MCV: 92.8 fL (ref 80.0–100.0)
Monocytes Absolute: 0.9 10*3/uL (ref 0.1–1.0)
Monocytes Relative: 8 %
Neutro Abs: 7.9 10*3/uL — ABNORMAL HIGH (ref 1.7–7.7)
Neutrophils Relative %: 70 %
Platelets: 282 10*3/uL (ref 150–400)
RBC: 5.12 MIL/uL (ref 4.22–5.81)
RDW: 12.8 % (ref 11.5–15.5)
WBC: 11.3 10*3/uL — ABNORMAL HIGH (ref 4.0–10.5)
nRBC: 0 % (ref 0.0–0.2)

## 2020-06-09 LAB — RESP PANEL BY RT-PCR (FLU A&B, COVID) ARPGX2
Influenza A by PCR: NEGATIVE
Influenza B by PCR: NEGATIVE
SARS Coronavirus 2 by RT PCR: NEGATIVE

## 2020-06-09 LAB — POCT URINE DRUG SCREEN - MANUAL ENTRY (I-SCREEN)
POC Amphetamine UR: NOT DETECTED
POC Buprenorphine (BUP): NOT DETECTED
POC Cocaine UR: NOT DETECTED
POC Marijuana UR: POSITIVE — AB
POC Methadone UR: NOT DETECTED
POC Methamphetamine UR: NOT DETECTED
POC Morphine: NOT DETECTED
POC Oxazepam (BZO): NOT DETECTED
POC Oxycodone UR: NOT DETECTED
POC Secobarbital (BAR): NOT DETECTED

## 2020-06-09 LAB — LIPID PANEL
Cholesterol: 142 mg/dL (ref 0–200)
HDL: 51 mg/dL (ref >40)
LDL Cholesterol: 81 mg/dL (ref 0–99)
Total CHOL/HDL Ratio: 2.8 RATIO
Triglycerides: 51 mg/dL (ref <150)
VLDL: 10 mg/dL (ref 0–40)

## 2020-06-09 LAB — POC SARS CORONAVIRUS 2 AG -  ED: SARS Coronavirus 2 Ag: NEGATIVE

## 2020-06-09 MED ORDER — ACETAMINOPHEN 325 MG PO TABS: 650.0000 mg | ORAL_TABLET | Freq: Four times a day (QID) | ORAL | Status: DC | PRN

## 2020-06-09 MED ORDER — MAGNESIUM HYDROXIDE 400 MG/5ML PO SUSP: 30.0000 mL | Freq: Every day | ORAL | Status: DC | PRN

## 2020-06-09 MED ORDER — ALUM & MAG HYDROXIDE-SIMETH 200-200-20 MG/5ML PO SUSP: 30.0000 mL | ORAL | Status: DC | PRN
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
## 2020-06-09 NOTE — ED Notes (Signed)
Pt sleeping at present, no distress noted, monitoring for safety. 

## 2020-06-09 NOTE — ED Provider Notes (Addendum)
Behavioral Health Admission H&P [facility](FBC & OBS)  Date: 06/10/20 Patient Name: Jay MerlesJairo Woods MRN: [id number] Chief Complaint:  Chief Complaint  Patient presents with  . Psychosis      Diagnoses:  Final diagnoses:  None    JXB:JYNWGHPI:Dhruvan Woods is a 20 year old male.  [facility] unusual behaviors.  [facility] observation with reassessment by psychiatry on 06/10/20.  Patient reported he is unsure why he was brought to the hospital.  He reports that his mom and girlfriend wanted him to be in the hospital because he has been acting strange and trying to take care of his mom and girlfriend. He is tangential and speaks of playing a game that he saw on YouTube, he also talked about playing baseball, and not wanting to sleep because his girlfriend is unable to sleep. Patient speaks of different events while he was jail and that he not "crazy although it may appear that way."   Patient was evaluated [facility].  Patient is alert and oriented x4, pleasant and cooperative however I he is very talkative and a poor historian. Patient is tangential, disorganized thought process and flight of ideas. His speech is slightly pressured with normal volume and tone.  He denies acute respiratory distress, chest pain, shortness of breath, headaches, dizziness, fevers, chills, or diarrhea.   Patient is denying suicidal ideation, homicidal ideation, paranoia, auditory/visual hallucination,and he denies access to a weapon. He endorses marijuana use, he denies alcohol and other illegal substance use.  Patient reports that he currently lives at home with his mom and he has a girlfriend that he stays with occasionally.  PHQ 2-9:   Flowsheet Row OP [facility] Admission (Discharged) from [facility]07[facility]   C-SSRS RISK CATEGORY No Risk No Risk       Total Time spent with patient: 20 minutes  Musculoskeletal  Strength & Muscle Tone: within normal limits Gait & Station: normal Patient leans: Right  Psychiatric Specialty Exam  Presentation General Appearance: Appropriate for Environment  Eye Contact:Good  Speech:Normal Rate  Speech Volume:Normal  Handedness:Right   Mood and Affect  Mood:Euthymic  Affect:Congruent   Thought Process  Thought Processes:Coherent  Descriptions of Associations:Intact  Orientation:Full (Time, Place and Person)  Thought Content:WDL  Diagnosis of Schizophrenia or Schizoaffective disorder in past: No  Duration of Psychotic Symptoms: Less than six months  Hallucinations:Hallucinations: None  Ideas of Reference:None  Suicidal Thoughts:Suicidal Thoughts: No  Homicidal Thoughts:Homicidal Thoughts: No   Sensorium  Memory:Immediate Good; Recent Fair; Remote Fair  Judgment:Fair  Insight:Poor   Executive Functions  Concentration:Poor  Attention Span:Poor  Recall:Fair  Fund of Knowledge:Fair  Language:Fair   Psychomotor Activity  Psychomotor Activity:Psychomotor Activity: Normal   Assets  Assets:Desire for Improvement; Manufacturing systems engineerCommunication Skills; Housing; Social Support   Sleep  Sleep:Sleep: Fair Number of Hours of Sleep: 6   Nutritional Assessment (For OBS and FBC admissions only) Has the patient had a weight loss or gain of 10 pounds or more in the last 3 months?: No Has the patient had a decrease in food intake/or appetite?: Yes Does the patient have dental problems?: No Does the patient have eating habits or behaviors that may be indicators of an eating disorder including binging or inducing vomiting?: No Has the patient recently lost weight without trying?: No Has the patient been eating poorly because of a decreased appetite?: No  Malnutrition Screening Tool Score: 0    Physical Exam Vitals and nursing note  reviewed.  Constitutional:      Appearance: He is well-developed.  HENT:     Head: Normocephalic and atraumatic.  Eyes:     Conjunctiva/sclera: Conjunctivae normal.  Cardiovascular:     Rate and Rhythm: Normal rate and regular rhythm.     Heart sounds: No murmur heard.   Pulmonary:     Effort: Pulmonary effort is normal. No respiratory distress.     Breath sounds: Normal breath sounds.  Abdominal:     Palpations: Abdomen is soft.     Tenderness: There is no abdominal tenderness.  Musculoskeletal:     Cervical back: Neck supple.  Skin:    General: Skin is warm and dry.     Coloration: Skin is not jaundiced or pale.  Neurological:     Mental Status: He is alert and oriented to person, place, and time.  Psychiatric:        Attention and Perception: Attention and perception normal. He does not perceive auditory or visual hallucinations.        Mood and Affect: Mood and affect normal.        Speech: Speech is tangential.        Behavior: Behavior is hyperactive. Behavior is cooperative.        Thought Content: Thought content is paranoid. Thought content is not delusional. Thought content does not include homicidal or suicidal ideation. Thought content does not include homicidal or suicidal plan.        Cognition and Memory: Cognition normal.        Judgment: Judgment normal.    Review of Systems  Constitutional: Negative.   HENT: Negative.   Eyes: Negative.   Respiratory: Negative.   Cardiovascular: Negative.   Gastrointestinal: Negative.   Genitourinary: Negative.   Musculoskeletal: Negative.   Skin: Negative.   Neurological: Negative.   Endo/Heme/Allergies: Negative.   Psychiatric/Behavioral: Positive for substance abuse. Negative for depression, hallucinations and suicidal ideas. The patient is not nervous/anxious.     Blood pressure 134/87, pulse 85, temperature 98.3 F (36.8 C), temperature source Oral, resp. rate 18, SpO2 100 %. There is no height or weight on  file to calculate BMI.  Past Psychiatric History:    Is the patient at risk to self? No  Has the patient been a risk to self in the past 6 months? No .    Has the patient been a risk to self within the distant past? Yes   Is the patient a risk to others? No   Has the patient been a risk to others in the past 6 months? No   Has the patient been a risk to others within the distant past? Yes   Past Medical History:  Past Medical History:  Diagnosis Date  . Eczema    No past surgical history on file.  Family History: No family history on file.  Social History:  Social History   Socioeconomic History  . Marital status: Single    Spouse name: Not on file  . Number of children: Not on file  . Years of education: Not on file  . Highest education level: Not on file  Occupational History  . Not on file  Tobacco Use  . Smoking status: Current Every Day Smoker    Packs/day: 1.00    Years: 5.00    Pack years: 5.00    Types: Cigarettes  . Smokeless tobacco: Former Neurosurgeon  Vaping Use  . Vaping Use: Some days  . Substances: CBD  Substance and Sexual Activity  . Alcohol use: Yes  . Drug use: Yes    Types: Marijuana  . Sexual activity: Not Currently  Other Topics Concern  . Not on file  Social History Narrative  . Not on file   Social Determinants of Health   Financial Resource Strain: Not on file  Food Insecurity: Not on file  Transportation Needs: Not on file  Physical Activity: Not on file  Stress: Not on file  Social Connections: Not on file  Intimate Partner Violence: Not on file    SDOH:  SDOH Screenings   Alcohol Screen: Not on file  Depression (ZOX0-9): Not on file  Financial Resource Strain: Not on file  Food Insecurity: Not on file  Housing: Not on file  Physical Activity: Not on file  Social Connections: Not on file  Stress: Not on file  Tobacco Use: High Risk  . Smoking Tobacco Use: Current Every Day Smoker  . Smokeless Tobacco Use: Former Hydrographic surveyor Needs: Not on file    Last Labs:  Admission on 06/09/2020  Component Date Value Ref Range Status  . SARS Coronavirus 2 by RT PCR 06/09/2020 NEGATIVE  NEGATIVE Final   Comment: (NOTE) SARS-CoV-2 target nucleic acids are NOT DETECTED.  The SARS-CoV-2 RNA is generally detectable in upper respiratory specimens during the acute phase of infection. The lowest concentration of SARS-CoV-2 viral copies this assay can detect is 138 copies/mL. A negative result does not preclude SARS-Cov-2 infection and should not be used as the sole basis for treatment or other patient management decisions. A negative result may occur with  improper specimen collection/handling, submission of specimen other than nasopharyngeal swab, presence of viral mutation(s) within the areas targeted by this assay, and inadequate number of viral copies(<138 copies/mL). A negative result must be combined with clinical observations, patient history, and epidemiological information. The expected result is Negative.  Fact Sheet for Patients:  BloggerCourse.com  Fact Sheet for Healthcare Providers:  SeriousBroker.it  This test is no                          t yet approved or cleared by the [place] FDA and  has been authorized for detection and/or diagnosis of SARS-CoV-2 by FDA under an Emergency Use Authorization (EUA). This EUA will remain  in effect (meaning this test can be used) for the duration of the COVID-19 declaration under Section 564(b)(1) of the Act, 21 U.S.C.section 360bbb-3(b)(1), unless the authorization is terminated  or revoked sooner.      . Influenza A by PCR 06/09/2020 NEGATIVE  NEGATIVE Final  . Influenza B by PCR 06/09/2020 NEGATIVE  NEGATIVE Final   Comment: (NOTE) The Xpert Xpress SARS-CoV-2/FLU/RSV plus assay is intended as an aid in the diagnosis of influenza from Nasopharyngeal swab specimens and should not be used as  a sole basis for treatment. Nasal washings and aspirates are unacceptable for Xpert Xpress SARS-CoV-2/FLU/RSV testing.  Fact Sheet for Patients: BloggerCourse.com  Fact Sheet for Healthcare Providers: SeriousBroker.it  This test is not yet approved or cleared by the [place] FDA and has been authorized for detection and/or diagnosis of SARS-CoV-2 by FDA under an Emergency Use Authorization (EUA). This EUA will remain in effect (meaning this test can be used) for the duration of the COVID-19 declaration under Section 564(b)(1) of the Act, 21 U.S.C. section 360bbb-3(b)(1), unless the authorization  is terminated or revoked.  Performed at [facility], [address], [place], [place] [address]   . SARS Coronavirus 2 Ag 06/09/2020 Negative  Negative Preliminary  . WBC 06/09/2020 11.3* 4.0 - 10.5 K/uL Final  . RBC 06/09/2020 5.12  4.22 - 5.81 MIL/uL Final  . Hemoglobin 06/09/2020 15.9  13.0 - 17.0 g/dL Final  . HCT 70/76/1518 47.5  39.0 - 52.0 % Final  . MCV 06/09/2020 92.8  80.0 - 100.0 fL Final  . MCH 06/09/2020 31.1  26.0 - 34.0 pg Final  . MCHC 06/09/2020 33.5  30.0 - 36.0 g/dL Final  . RDW 34/37/3578 12.8  11.5 - 15.5 % Final  . Platelets 06/09/2020 282  150 - 400 K/uL Final  . nRBC 06/09/2020 0.0  0.0 - 0.2 % Final  . Neutrophils Relative % 06/09/2020 70  % Final  . Neutro Abs 06/09/2020 7.9* 1.7 - 7.7 K/uL Final  . Lymphocytes Relative 06/09/2020 21  % Final  . Lymphs Abs 06/09/2020 2.4  0.7 - 4.0 K/uL Final  . Monocytes Relative 06/09/2020 8  % Final  . Monocytes Absolute 06/09/2020 0.9  0.1 - 1.0 K/uL Final  . Eosinophils Relative 06/09/2020 0  % Final  . Eosinophils Absolute 06/09/2020 0.1  0.0 - 0.5 K/uL Final  . Basophils Relative 06/09/2020 1  % Final  . Basophils Absolute 06/09/2020 0.1  0.0 - 0.1 K/uL Final  . Immature Granulocytes 06/09/2020 0  % Final  . Abs Immature Granulocytes 06/09/2020 0.03  0.00 -  0.07 K/uL Final   Performed at [facility], [address], [place], [place] [address]  . Sodium 06/09/2020 139  135 - 145 mmol/L Final  . Potassium 06/09/2020 4.4  3.5 - 5.1 mmol/L Final  . Chloride 06/09/2020 102  98 - 111 mmol/L Final  . CO2 06/09/2020 29  22 - 32 mmol/L Final  . Glucose, Bld 06/09/2020 78  70 - 99 mg/dL Final   Glucose reference range applies only to samples taken after fasting for at least 8 hours.  . BUN 06/09/2020 14  6 - 20 mg/dL Final  . Creatinine, Ser 06/09/2020 0.85  0.61 - 1.24 mg/dL Final  . Calcium 84/01/8207 10.1  8.9 - 10.3 mg/dL Final  . Total Protein 06/09/2020 8.5* 6.5 - 8.1 g/dL Final  . Albumin 13/88/7195 5.2* 3.5 - 5.0 g/dL Final  . AST 97/47/1855 45* 15 - 41 U/L Final  . ALT 06/09/2020 50* 0 - 44 U/L Final  . Alkaline Phosphatase 06/09/2020 47  38 - 126 U/L Final  . Total Bilirubin 06/09/2020 0.9  0.3 - 1.2 mg/dL Final  . GFR, Estimated 06/09/2020 >60  >60 mL/min Final   Comment: (NOTE) Calculated using the CKD-EPI Creatinine Equation (2021)   . Anion gap 06/09/2020 8  5 - 15 Final   Performed at [facility], [address], [place], [place] [address]  . Alcohol, Ethyl (B) 06/09/2020 <10  <10 mg/dL Final   Comment: (NOTE) Lowest detectable limit for serum alcohol is 10 mg/dL.  For medical purposes only. Performed at [facility] [place], [address], [place], [place] [address]   . TSH 06/09/2020 0.318* 0.350 - 4.500 uIU/mL Final   Comment: Performed by a 3rd Generation assay with a functional sensitivity of <=0.01 uIU/mL. Performed at [facility], [address], [place], [place] [address]   . POC Amphetamine UR 06/09/2020 None Detected  NONE DETECTED (Cut Off Level 1000  ng/mL) Preliminary  . POC Secobarbital (BAR) 06/09/2020 None Detected  NONE DETECTED (Cut Off Level 300 ng/mL) Preliminary  . POC Buprenorphine (BUP) 06/09/2020 None Detected  NONE DETECTED (Cut Off Level 10 ng/mL) Preliminary  . POC Oxazepam (BZO)  06/09/2020 None Detected  NONE DETECTED (Cut Off Level 300 ng/mL) Preliminary  . POC Cocaine UR 06/09/2020 None Detected  NONE DETECTED (Cut Off Level 300 ng/mL) Preliminary  . POC Methamphetamine UR 06/09/2020 None Detected  NONE DETECTED (Cut Off Level 1000 ng/mL) Preliminary  . POC Morphine 06/09/2020 None Detected  NONE DETECTED (Cut Off Level 300 ng/mL) Preliminary  . POC Oxycodone UR 06/09/2020 None Detected  NONE DETECTED (Cut Off Level 100 ng/mL) Preliminary  . POC Methadone UR 06/09/2020 None Detected  NONE DETECTED (Cut Off Level 300 ng/mL) Preliminary  . POC Marijuana UR 06/09/2020 Positive* NONE DETECTED (Cut Off Level 50 ng/mL) Preliminary  . Valproic Acid Lvl 06/09/2020 <10* 50.0 - 100.0 ug/mL Final   Comment: RESULTS CONFIRMED BY MANUAL DILUTION Performed at [facility], [address], [place], [place] [address]   . Cholesterol 06/09/2020 142  0 - 200 mg/dL Final  . Triglycerides 06/09/2020 51  <150 mg/dL Final  . HDL 41/93/7902 51  >40 mg/dL Final  . Total CHOL/HDL Ratio 06/09/2020 2.8  RATIO Final  . VLDL 06/09/2020 10  0 - 40 mg/dL Final  . LDL Cholesterol 06/09/2020 81  0 - 99 mg/dL Final   Comment:        Total Cholesterol/HDL:CHD Risk Coronary Heart Disease Risk Table                     Men   Women  1/2 Average Risk   3.4   3.3  Average Risk       5.0   4.4  2 X Average Risk   9.6   7.1  3 X Average Risk  23.4   11.0        Use the calculated Patient Ratio above and the CHD Risk Table to determine the patient's CHD Risk.        ATP III CLASSIFICATION (LDL):  <100     mg/dL   Optimal  409-735  mg/dL   Near or Above                    Optimal  130-159  mg/dL   Borderline  329-924  mg/dL   High  >268     mg/dL   Very High Performed at [facility], [address], [place], [place] [address]   . SARS Coronavirus 2 Ag 06/09/2020 NEGATIVE  NEGATIVE Final   Comment: (NOTE) SARS-CoV-2 antigen NOT DETECTED.   Negative results are presumptive.   Negative results do not preclude SARS-CoV-2 infection and should not be used as the sole basis for treatment or other patient management decisions, including infection  control decisions, particularly in the presence of clinical signs and  symptoms consistent with COVID-19, or in those who have been in contact with the virus.  Negative results must be combined with clinical observations, patient history, and epidemiological information. The expected result is Negative.  Fact Sheet for Patients: https://www.jennings-kim.com/  Fact Sheet for Healthcare Providers: https://alexander-rogers.biz/  This test is not yet approved or cleared by the [place] FDA and  has been authorized for detection and/or diagnosis of SARS-CoV-2 by FDA under an Emergency Use Authorization (EUA).  This EUA will remain in effect (meaning  this test can be used) for the duration of  the COV                          ID-19 declaration under Section 564(b)(1) of the Act, 21 U.S.C. section 360bbb-3(b)(1), unless the authorization is terminated or revoked sooner.      Allergies: Patient has no known allergies.  PTA Medications: (Not in a hospital admission)   Medical Decision Making  Admit patient to [facility] for continuous assessment and observation for safety with reassessment of psychiatry on 06/10/20  -Obtain labs including Valporic acid level patient reported he has not been taking his psychotropic medications  -pt's Valproic Acid is <10, will restart Depakote at  BID -restart Risperdal at  BID -cogentin  daily    Recommendations  Based on my evaluation the patient does not appear to have an emergency medical condition.  Maricela Bo, NP 06/10/20  4:38 AM

## 2020-06-09 NOTE — ED Notes (Signed)
Patient admitted to [facility] adult wing, bed #5 with endorsement of auditory hallucinations and odd behaviors.  Denies SI, HI or depression.  Alert, oriented with clear speech and appropriate responses.  Patient exhibits periods of wide and elevated behavior and references "God" multiple times during admission process.  Denies pain, discomfort, or disturbance on assessment.  Skin is clean, dry, intact.  Bilateral upper arm tattoos noted.  Good appetite with sandwich, chips and beverage provided.  Continent of bowel and bladder.  No futher needs or concerns apparent at the time of this writing.

## 2020-06-10 ENCOUNTER — Encounter (HOSPITAL_COMMUNITY): Payer: Self-pay | Admitting: Psychiatry

## 2020-06-10 ENCOUNTER — Inpatient Hospital Stay (HOSPITAL_COMMUNITY)
Admission: RE | Admit: 2020-06-10 | Discharge: 2020-06-26 | DRG: 885 | Disposition: A | Discharge status: Home / Self Care | Payer: Federal, State, Local not specified - Other | Attending: Emergency Medicine | Admitting: Emergency Medicine

## 2020-06-10 DIAGNOSIS — G4700 Insomnia, unspecified: Secondary | ICD-10-CM | POA: Diagnosis present

## 2020-06-10 DIAGNOSIS — F29 Unspecified psychosis not due to a substance or known physiological condition: Principal | ICD-10-CM | POA: Diagnosis present

## 2020-06-10 DIAGNOSIS — R000 Tachycardia, unspecified: Secondary | ICD-10-CM | POA: Diagnosis present

## 2020-06-10 DIAGNOSIS — Z79899 Other long term (current) drug therapy: Secondary | ICD-10-CM

## 2020-06-10 DIAGNOSIS — F6381 Intermittent explosive disorder: Secondary | ICD-10-CM | POA: Diagnosis present

## 2020-06-10 DIAGNOSIS — Z87891 Personal history of nicotine dependence: Secondary | ICD-10-CM

## 2020-06-10 DIAGNOSIS — Z20822 Contact with and (suspected) exposure to covid-19: Secondary | ICD-10-CM | POA: Diagnosis present

## 2020-06-10 DIAGNOSIS — F1219 Cannabis abuse with unspecified cannabis-induced disorder: Secondary | ICD-10-CM | POA: Diagnosis present

## 2020-06-10 DIAGNOSIS — F1994 Other psychoactive substance use, unspecified with psychoactive substance-induced mood disorder: Secondary | ICD-10-CM | POA: Diagnosis present

## 2020-06-10 DIAGNOSIS — I10 Essential (primary) hypertension: Secondary | ICD-10-CM | POA: Diagnosis present

## 2020-06-10 DIAGNOSIS — F1210 Cannabis abuse, uncomplicated: Secondary | ICD-10-CM | POA: Diagnosis present

## 2020-06-10 DIAGNOSIS — F1221 Cannabis dependence, in remission: Secondary | ICD-10-CM

## 2020-06-10 DIAGNOSIS — Z9119 Patient's noncompliance with other medical treatment and regimen: Secondary | ICD-10-CM | POA: Diagnosis not present

## 2020-06-10 LAB — ETHANOL: Alcohol, Ethyl (B): <10 mg/dL (ref <10)

## 2020-06-10 LAB — VALPROIC ACID LEVEL: Valproic Acid Lvl: <10 ug/mL — ABNORMAL LOW (ref 50.0–100.0)

## 2020-06-10 LAB — HEMOGLOBIN A1C
Hgb A1c MFr Bld: 5.6 % (ref 4.8–5.6)
Mean Plasma Glucose: 114 mg/dL

## 2020-06-10 LAB — TSH: TSH: 0.318 u[IU]/mL — ABNORMAL LOW (ref 0.350–4.500)

## 2020-06-10 LAB — POC SARS CORONAVIRUS 2 AG: SARS Coronavirus 2 Ag: NEGATIVE

## 2020-06-10 MED ORDER — BENZTROPINE MESYLATE 1 MG PO TABS
1.0000 mg | ORAL_TABLET | Freq: Two times a day (BID) | ORAL | Status: DC
  Administered 2020-06-10 – 2020-06-12 (×4): 1 mg via ORAL
  Filled 2020-06-10 (×9): qty 1

## 2020-06-10 MED ORDER — OLANZAPINE 10 MG PO TBDP
10.0000 mg | ORAL_TABLET | Freq: Three times a day (TID) | ORAL | Status: DC | PRN
  Administered 2020-06-12 – 2020-06-13 (×2): 10 mg via ORAL
  Filled 2020-06-10 (×2): qty 1

## 2020-06-10 MED ORDER — BENZTROPINE MESYLATE 1 MG PO TABS
1.0000 mg | ORAL_TABLET | Freq: Two times a day (BID) | ORAL | Status: DC
  Administered 2020-06-10: 1 mg via ORAL
  Filled 2020-06-10: qty 1

## 2020-06-10 MED ORDER — MAGNESIUM HYDROXIDE 400 MG/5ML PO SUSP: 30.0000 mL | Freq: Every day | ORAL | Status: DC | PRN

## 2020-06-10 MED ORDER — ALUM & MAG HYDROXIDE-SIMETH 200-200-20 MG/5ML PO SUSP: 30.0000 mL | ORAL | Status: DC | PRN

## 2020-06-10 MED ORDER — RISPERIDONE 2 MG PO TABS
2.0000 mg | ORAL_TABLET | Freq: Two times a day (BID) | ORAL | Status: DC
  Administered 2020-06-10 – 2020-06-11 (×2): 2 mg via ORAL
  Filled 2020-06-10 (×7): qty 1

## 2020-06-10 MED ORDER — RISPERIDONE 2 MG PO TABS: 2.0000 mg | ORAL_TABLET | Freq: Two times a day (BID) | ORAL | Status: DC

## 2020-06-10 MED ORDER — DIVALPROEX SODIUM 250 MG PO DR TAB
250.0000 mg | DELAYED_RELEASE_TABLET | Freq: Two times a day (BID) | ORAL | Status: DC
  Administered 2020-06-10: 250 mg via ORAL
  Filled 2020-06-10: qty 1

## 2020-06-10 MED ORDER — RISPERIDONE 1 MG PO TABS
1.0000 mg | ORAL_TABLET | Freq: Two times a day (BID) | ORAL | Status: DC
Start: 2020-06-10 — End: 2020-06-10
  Administered 2020-06-10: 1 mg via ORAL
  Filled 2020-06-10: qty 1

## 2020-06-10 MED ORDER — DIVALPROEX SODIUM 500 MG PO DR TAB: 500.0000 mg | DELAYED_RELEASE_TABLET | Freq: Two times a day (BID) | ORAL | Status: DC

## 2020-06-10 MED ORDER — LORAZEPAM 1 MG PO TABS
1.0000 mg | ORAL_TABLET | ORAL | Status: AC | PRN
  Administered 2020-06-10: 1 mg via ORAL
  Filled 2020-06-10: qty 1

## 2020-06-10 MED ORDER — ACETAMINOPHEN 325 MG PO TABS
650.0000 mg | ORAL_TABLET | Freq: Four times a day (QID) | ORAL | Status: DC | PRN
  Administered 2020-06-18 – 2020-06-21 (×6): 650 mg via ORAL
  Filled 2020-06-10 (×6): qty 2

## 2020-06-10 MED ORDER — ZIPRASIDONE MESYLATE 20 MG IM SOLR: 20.0000 mg | INTRAMUSCULAR | Status: DC | PRN

## 2020-06-10 MED ORDER — DIVALPROEX SODIUM 500 MG PO DR TAB
500.0000 mg | DELAYED_RELEASE_TABLET | Freq: Two times a day (BID) | ORAL | Status: DC
  Administered 2020-06-10 – 2020-06-11 (×2): 500 mg via ORAL
  Filled 2020-06-10 (×7): qty 1

## 2020-06-10 NOTE — Progress Notes (Signed)
Pt accepted to [facility] room 505-2  Patient meets inpatient criteria per Cecilio Asper, NP.  Dr. Mason Jim is the attending provider.    Call report to [phone].  [name], RN @ [facility] notified.     Signed:  Corky Crafts, MSW, [place], LCASA 06/10/2020 3:10 PM

## 2020-06-10 NOTE — Progress Notes (Signed)
Pt admitted to [facility] under IVC.  Pt admitted d/t unusual behaviors.  Pt was noted to display tangential speech and was hyper-religious.  Pt stated he wanted to help himself and better his future for his future wife.  Pt denied SI, HI and AVH during admission though writer noticed patient talking to self.  Pt presented with an ankle monitor.  No charger was with patient.  Pt stated he has an upcoming court date but was unable to provide the date.  Pt lives with mother and siblings.  Fifteen minute checks initiated for patient safety.  Pt safe on unit.

## 2020-06-10 NOTE — ED Notes (Signed)
Pt sleeping at present, no distress noted, monitoring for safety. 

## 2020-06-10 NOTE — ED Provider Notes (Signed)
FBC/OBS ASAP Discharge Summary  Date and Time: 06/10/2020 3:51 PM  Name: Jay Woods  MRN:  [id number]   Discharge Diagnoses:  Final diagnoses:  Psychosis, unspecified psychosis type (HCC)    Subjective: Patient seen and chart reviewed. Pt has been medication compliant. UDS+ marijuana. On interview this morning patient is hyper religious, tangential. disorganized. He describes his mood as "good" and states that it is "the truth". He indicates that he feels that he has been communicating with God.  When asked what brought him into the hospital he states  "a second amount of attention. I just need forgiveness". When asked for clarification, pt states "I know cars need gas". He denies SI/HI/VH. He reports AH of "the truth"; he is unable/unwilling to discuss further. When asked what he needs help with, he states "I just need to keep praying and it will be like connect 4". Discussed inpatient hospitalization and increasing medication doses. Pt is amenable but becomes momentarily tearful expressing concern for his friends, "I want nothing to happen to them". Pt unable/unwilling to discuss his concerns about his friends but is amenable to inpatient hospitalization at this time. Pt is smiling and laughing inappropriately throughout assessment.   Will increase depakote from 250 BID to 500 BID and risperdal 1 mg BID to 2 mg BID.  Stay Summary:   Jay Woods is a 20 year old male.  Patient presented voluntarily to [facility] for evaluation of unusual behaviors.  Patient was transferred from [facility] to [facility] for overnight observation on 06/09/20.  Patient reported he is unsure why he was brought to the hospital.  He reports that his mom and girlfriend wanted him to be in the hospital because he has been acting strange and trying to take care of his mom and girlfriend. He is tangential and speaks of playing a game that he saw on YouTube, he also talked about playing baseball, and not wanting to sleep  because his girlfriend is unable to sleep. Patient speaks of different events while he was jail and that he not "crazy although it may appear that way."   Pt admitted to observation and restarted on depakote 250 BID and risperdal 1 mg BID. The following day, patient continued to present with a thought disorder (disorganized, tangential) and was hyper religious. Medications adjusted as above and he was transferred to [place] [facility]  Total Time spent with patient: 20 minutes  Past Psychiatric History: psychosis Past Medical History:  Past Medical History:  Diagnosis Date  . Eczema    No past surgical history on file. Family History: No family history on file. Family Psychiatric History: see H&P Social History:  Social History   Substance and Sexual Activity  Alcohol Use Yes     Social History   Substance and Sexual Activity  Drug Use Yes  . Types: Marijuana    Social History   Socioeconomic History  . Marital status: Single    Spouse name: Not on file  . Number of children: Not on file  . Years of education: Not on file  . Highest education level: Not on file  Occupational History  . Not on file  Tobacco Use  . Smoking status: Current Every Day Smoker    Packs/day: 1.00    Years: 5.00    Pack years: 5.00    Types: Cigarettes  . Smokeless tobacco: Former Clinical biochemist  . Vaping Use: Some days  . Substances: CBD  Substance and Sexual Activity  . Alcohol use: Yes  .  Drug use: Yes    Types: Marijuana  . Sexual activity: Not Currently  Other Topics Concern  . Not on file  Social History Narrative  . Not on file   Social Determinants of Health   Financial Resource Strain: Not on file  Food Insecurity: Not on file  Transportation Needs: Not on file  Physical Activity: Not on file  Stress: Not on file  Social Connections: Not on file   SDOH:  SDOH Screenings   Alcohol Screen: Not on file  Depression (IEP3-2): Not on file  Financial Resource Strain: Not  on file  Food Insecurity: Not on file  Housing: Not on file  Physical Activity: Not on file  Social Connections: Not on file  Stress: Not on file  Tobacco Use: High Risk  . Smoking Tobacco Use: Current Every Day Smoker  . Smokeless Tobacco Use: Former Dispensing optician Needs: Not on file    Has this patient used any form of tobacco in the last 30 days? (Cigarettes, Smokeless Tobacco, Cigars, and/or Pipes) Prescription not provided because: n/a  Current Medications:  Current Facility-Administered Medications  Medication Dose Route Frequency Provider Last Rate Last Admin  . acetaminophen (TYLENOL) tablet 650 mg  650 mg Oral Q6H PRN Ajibola, Ene A, NP      . alum & mag hydroxide-simeth (MAALOX/MYLANTA) 200-200-20 MG/5ML suspension 30 mL  30 mL Oral Q4H PRN Ajibola, Ene A, NP      . benztropine (COGENTIN) tablet 1 mg  1 mg Oral BID Ajibola, Ene A, NP   1 mg at 06/10/20 0839  . divalproex (DEPAKOTE) DR tablet 500 mg  500 mg Oral Q12H Estella Husk, MD      . magnesium hydroxide (MILK OF MAGNESIA) suspension 30 mL  30 mL Oral Daily PRN Ajibola, Ene A, NP      . risperiDONE (RISPERDAL) tablet 2 mg  2 mg Oral BID Estella Husk, MD       Current Outpatient Medications  Medication Sig Dispense Refill  . divalproex (DEPAKOTE) 500 MG DR tablet Take 1 tablet (500 mg total) by mouth every 12 (twelve) hours.    . paliperidone (INVEGA SUSTENNA) 156 MG/ML SUSY injection Inject 1 mL (156 mg total) into the muscle once for 1 dose. Due 10/23 1 mL 0  . risperiDONE (RISPERDAL) 2 MG tablet Take 1 tablet (2 mg total) by mouth 2 (two) times daily.      PTA Medications: (Not in a hospital admission)   Musculoskeletal  Strength & Muscle Tone: within normal limits Gait & Station: normal Patient leans: N/A  Psychiatric Specialty Exam  Presentation  General Appearance: Appropriate for Environment; Casual  Eye Contact:Fair  Speech:Normal Rate  Speech  Volume:Normal  Handedness:Right   Mood and Affect  Mood:Euphoric  Affect:Labile   Thought Process  Thought Processes:Disorganized  Descriptions of Associations:Intact  Orientation:Full (Time, Place and Person)  Thought Content:WDL  Diagnosis of Schizophrenia or Schizoaffective disorder in past: No  Duration of Psychotic Symptoms: Less than six months   Hallucinations:Hallucinations: Auditory Description of Auditory Hallucinations: "the truth"  Ideas of Reference:Delusions; Paranoia  Suicidal Thoughts:Suicidal Thoughts: No  Homicidal Thoughts:Homicidal Thoughts: No   Sensorium  Memory:Immediate Fair; Recent Fair; Remote Fair  Judgment:Impaired  Insight:Lacking; Poor   Executive Functions  Concentration:Poor  Attention Span:Poor  Recall:Fair  Fund of Knowledge:Fair  Language:Fair   Psychomotor Activity  Psychomotor Activity:Psychomotor Activity: Normal   Assets  Assets:Desire for Improvement; Social Support   Sleep  Sleep:Sleep: Fair Number of  Hours of Sleep: 6   Nutritional Assessment (For OBS and FBC admissions only) Has the patient had a weight loss or gain of 10 pounds or more in the last 3 months?: No Has the patient had a decrease in food intake/or appetite?: Yes Does the patient have dental problems?: No Does the patient have eating habits or behaviors that may be indicators of an eating disorder including binging or inducing vomiting?: No Has the patient recently lost weight without trying?: No Has the patient been eating poorly because of a decreased appetite?: No Malnutrition Screening Tool Score: 0    Physical Exam  Physical Exam Constitutional:      Appearance: Normal appearance. He is normal weight.  HENT:     Head: Normocephalic and atraumatic.  Eyes:     Extraocular Movements: Extraocular movements intact.  Pulmonary:     Effort: Pulmonary effort is normal.  Neurological:     Mental Status: He is alert.    Review  of Systems  Constitutional: Negative for chills and fever.  Eyes: Negative for discharge and redness.  Respiratory: Negative for cough.   Cardiovascular: Negative for chest pain.  Gastrointestinal: Negative for abdominal pain.  Musculoskeletal: Negative for myalgias.  Neurological: Negative for headaches.  Psychiatric/Behavioral: Negative for depression and suicidal ideas.   Blood pressure 133/80, pulse 68, temperature 98.2 F (36.8 C), resp. rate 16, SpO2 98 %. There is no height or weight on file to calculate BMI.  Demographic Factors:  Male and Adolescent or Jay adult  Loss Factors: NA  Historical Factors: Impulsivity  Risk Reduction Factors:   Living with another person, especially a relative and Positive social support  Continued Clinical Symptoms:  Alcohol/Substance Abuse/Dependencies Currently Psychotic Previous Psychiatric Diagnoses and Treatments  Cognitive Features That Contribute To Risk:  Thought constriction (tunnel vision)    Suicide Risk:  Minimal: No identifiable suicidal ideation.  Patients presenting with no risk factors but with morbid ruminations; may be classified as minimal risk based on the severity of the depressive symptoms  Plan Of Care/Follow-up recommendations:  Transfer to bhh TSH 0.318; consider ordering free T3/T4 Likely increasing depakote and risperdal; at previous discharge was prescribed depkaote 500mg  qam/1000 mg pm and risperdal 4 mg BID  Disposition: [place] [facility] for treatment  , MD 06/10/2020, 3:51 PM

## 2020-06-10 NOTE — Progress Notes (Signed)
Jay Woods transferred to [facility] per MD order. Discussed with the patient and all questions fully answered. An After Visit Summary, EMTALA  And Med Necessity forms were printed and to be given to the receiving nurse. Report given yo [name], Charity fundraiser. Patient escorted out and transferred via safe transport.  [name]  06/10/2020 3:47 PM

## 2020-06-10 NOTE — ED Notes (Signed)
Patient has been tossing and turning in bed, on several occasions has has been yelling, "Oh God" while asleep. NP Ene, has been advised.

## 2020-06-10 NOTE — Progress Notes (Signed)
   06/10/20 2200  Psych Admission Type (Psych Patients Only)  Admission Status Involuntary  Psychosocial Assessment  Patient Complaints Anxiety;Hyperactivity  Eye Contact Fair  Facial Expression Animated  Affect Anxious;Labile  Speech Rapid;Tangential  Interaction Intrusive;Needy  Motor Activity Fidgety;Hyperactive;Restless  Appearance/Hygiene Unremarkable  Behavior Characteristics Cooperative;Anxious;Fidgety;Intrusive  Mood Labile  Thought Process  Coherency WDL  Content Religiosity  Delusions Religious  Perception Hallucinations  Hallucination Auditory  Judgment Impaired  Confusion WDL  Danger to Self  Current suicidal ideation? Denies  Danger to Others  Danger to Others None reported or observed  Jay Woods is pleasant and cooperative but is restless, hyper, impulsive and intrusive.  He is religiously preoccupied and was praying over his medications prior to taking them.  He admitted to voices but wouldn't discuss what they were saying then trailed off to another conversation.  He reported he is here because "my mother wanted me to be able to handle relationships with multiple girls but I only want one girl.  I am a christian."  Ativan was given for increase in agitation and his is currently resting with his eyes closed and appears to be asleep.  Q 15 minute checks maintained for safety.  We will continue to monitor the progress towards his goals.

## 2020-06-10 NOTE — Discharge Instructions (Signed)
Transfer to bhh °

## 2020-06-10 NOTE — Progress Notes (Signed)
Pt is resting with intermittent outbursts of "oh my Lord" and he will sometimes jerk up on the bed. Pt appears to be responding to internal stimuli. No signs of acute distress noted. Administered meds with no incident. Pt denies pain, SI, HI and AVH at this this time. Staff will monitor for pt's safety.

## 2020-06-11 DIAGNOSIS — F1994 Other psychoactive substance use, unspecified with psychoactive substance-induced mood disorder: Secondary | ICD-10-CM | POA: Diagnosis not present

## 2020-06-11 DIAGNOSIS — F29 Unspecified psychosis not due to a substance or known physiological condition: Principal | ICD-10-CM

## 2020-06-11 DIAGNOSIS — F6381 Intermittent explosive disorder: Secondary | ICD-10-CM

## 2020-06-11 DIAGNOSIS — F1219 Cannabis abuse with unspecified cannabis-induced disorder: Secondary | ICD-10-CM | POA: Diagnosis not present

## 2020-06-11 LAB — PROLACTIN: Prolactin: 4.4 ng/mL (ref 4.0–15.2)

## 2020-06-11 MED ORDER — RISPERIDONE 2 MG PO TABS
2.0000 mg | ORAL_TABLET | Freq: Every morning | ORAL | Status: DC
  Administered 2020-06-12: 2 mg via ORAL
  Filled 2020-06-11 (×3): qty 1

## 2020-06-11 MED ORDER — DIVALPROEX SODIUM ER 250 MG PO TB24
750.0000 mg | ORAL_TABLET | Freq: Every day | ORAL | Status: AC
  Administered 2020-06-11: 750 mg via ORAL
  Filled 2020-06-11: qty 3

## 2020-06-11 MED ORDER — TRAZODONE HCL 50 MG PO TABS
50.0000 mg | ORAL_TABLET | Freq: Every evening | ORAL | Status: DC | PRN
  Administered 2020-06-11 – 2020-06-25 (×21): 50 mg via ORAL
  Filled 2020-06-11: qty 14
  Filled 2020-06-11 (×13): qty 1
  Filled 2020-06-11: qty 14
  Filled 2020-06-11 (×24): qty 1

## 2020-06-11 MED ORDER — RISPERIDONE 3 MG PO TABS
3.0000 mg | ORAL_TABLET | Freq: Every day | ORAL | Status: DC
  Filled 2020-06-11 (×2): qty 1

## 2020-06-11 MED ORDER — DIVALPROEX SODIUM ER 500 MG PO TB24
500.0000 mg | ORAL_TABLET | Freq: Every day | ORAL | Status: DC
  Administered 2020-06-12 – 2020-06-16 (×5): 500 mg via ORAL
  Filled 2020-06-11 (×8): qty 1

## 2020-06-11 NOTE — Plan of Care (Signed)
  Problem: Education: Goal: Knowledge of the prescribed therapeutic regimen will improve Outcome: Progressing   Problem: Health Behavior/Discharge Planning: Goal: Compliance with prescribed medication regimen will improve Outcome: Progressing

## 2020-06-11 NOTE — Progress Notes (Signed)
Recreation Therapy Notes  Date: 4.19.22 Time: 1000 Location: 500 Hall Dayroom  Group Topic: Communication  Goal Area(s) Addresses:  Patient will effectively communicate with peers in group.  Patient will verbalize benefit of healthy communication. Patient will verbalize positive effect of healthy communication on post d/c goals.  Patient will identify communication techniques that made activity effective for group.   Behavioral Response: Minimal  Intervention: Blank paper, Pencils, Geometric Pictures  Activity: Geometric Drawings.  There were three volunteers for the activity.  Each volunteer would describe a picture to the rest of the group.  The rest of the group had to draw the picture how it was described to them.  If they missed any of the instructions, they could only ask the presenter to repeat themselves.  Once the the patients finished their drawings, the would compare it to the original to see how close they came to the original picture.       Education: Communication, Discharge Planning  Education Outcome: Acknowledges understanding/In group clarification offered/Needs additional education.   Clinical Observations/Feedback:  Pt was pleasant and appropriate during group session.  Pt had a hard time keeping up with the activity. Pt expressed he had a hard time concentrating on the activity.    Caroll Rancher, LRT/CTRS   Lillia Abed, Aayan Haskew A 06/11/2020 11:15 AM
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
## 2020-06-11 NOTE — Progress Notes (Signed)
   06/11/20 1000  Psych Admission Type (Psych Patients Only)  Admission Status Involuntary  Psychosocial Assessment  Patient Complaints Hyperactivity  Eye Contact Fair  Facial Expression Animated  Affect Anxious;Labile  Speech Rapid;Tangential  Interaction Intrusive;Needy  Motor Activity Fidgety;Hyperactive;Restless  Appearance/Hygiene Unremarkable  Behavior Characteristics Cooperative  Mood Preoccupied  Thought Process  Coherency WDL  Content Religiosity  Delusions Religious  Perception Hallucinations  Hallucination Auditory  Judgment Impaired  Confusion WDL  Danger to Self  Current suicidal ideation? Denies  Danger to Others  Danger to Others None reported or observed
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
## 2020-06-11 NOTE — Progress Notes (Signed)
Recreation Therapy Notes  INPATIENT RECREATION THERAPY ASSESSMENT  Patient Details Name: Jay Woods MRN: [id number] DOB: 02/13/2001 Today's Date: 06/11/2020       Information Obtained From: Patient  Able to Participate in Assessment/Interview: Yes  Patient Presentation: Alert  Reason for Admission (Per Patient): Other (Comments) (Pt stated he was here to get himself together and he feels safe here.)  Patient Stressors: Other (Comment) ([place])  Coping Skills:   TV,Sports,Exercise,Music,Meditate,Deep Psychologist, forensic  Leisure Interests (2+):  Individual - Reading,Music - Listen,Music - Singing,Music - Other (Comment) (Sing/rap)  Frequency of Recreation/Participation: Other (Comment) (Daily)  Awareness of Community Resources:  Yes  Community Resources:  [place],Other (Comment) (Stores)  Current Use: No  If no, Barriers?: Other (Comment) (Doesn't feel safe in those spaces)  Expressed Interest in [employer] Information: No  [place] of Residence:  [place]  Patient Main Form of Transportation: Other (Comment) (Police/Girlfriend)  Patient Strengths:  Keep letting people know they can do whatever they want; We're all in this together  Patient Identified Areas of Improvement:  "Yes, I did but got it handled.  Smoking since I'm not in [place] where I can go to a dispencery because when I smoke here it makes me feel paranoid".  Patient Goal for Hospitalization:  "to have fun and learn what I need to learn"  Current SI (including self-harm):  No  Current HI:  No  Current AVH: No  Staff Intervention Plan: Group Attendance,Collaborate with Interdisciplinary Treatment Team  Consent to Intern Participation: N/A    Caroll Rancher, LRT/CTRS  Caroll Rancher A 06/11/2020, 11:54 AM

## 2020-06-11 NOTE — Progress Notes (Signed)
Adult Psychoeducational Group Note  Date:  06/11/2020 Time:  3:02 AM  Group Topic/Focus:  Wrap-Up Group:   The focus of this group is to help patients review their daily goal of treatment and discuss progress on daily workbooks.  Participation Level:  Did Not Attend  Participation Quality:  Did Not Attend  Affect:  Did Not Attend  Cognitive:  Did Not Attend  Insight: None  Engagement in Group:  Did Not Attend  Modes of Intervention:  Did Not Attend  Additional Comments:  Pt did not attend evening wrap up group tonight.  Felipa Furnace 06/11/2020, 3:02 AM

## 2020-06-11 NOTE — [facility] Group Notes (Signed)
[facility] Group Notes:  (Nursing/MHT/Case Management/Adjunct)  Date:  06/11/2020  Time:  3:03 PM  Type of Therapy:  Group Therapy  Participation Level:  Active  Participation Quality:  Inattentive  Affect:  Flat  Cognitive:  Disorganized  Insight:  Lacking  Engagement in Group:  Lacking  Modes of Intervention:  Orientation  Summary of Progress/Problems: His goal for today is to get better and to take his medications so that he can get better.   Asante Ritacco J Maribell Demeo 06/11/2020, 3:03 PM

## 2020-06-12 DIAGNOSIS — F1994 Other psychoactive substance use, unspecified with psychoactive substance-induced mood disorder: Secondary | ICD-10-CM | POA: Diagnosis not present

## 2020-06-12 DIAGNOSIS — F29 Unspecified psychosis not due to a substance or known physiological condition: Secondary | ICD-10-CM | POA: Diagnosis not present

## 2020-06-12 DIAGNOSIS — F6381 Intermittent explosive disorder: Secondary | ICD-10-CM | POA: Diagnosis not present

## 2020-06-12 DIAGNOSIS — F1219 Cannabis abuse with unspecified cannabis-induced disorder: Secondary | ICD-10-CM | POA: Diagnosis not present

## 2020-06-12 MED ORDER — LORATADINE 10 MG PO TABS
10.0000 mg | ORAL_TABLET | Freq: Every day | ORAL | Status: DC
  Administered 2020-06-12 – 2020-06-26 (×15): 10 mg via ORAL
  Filled 2020-06-12 (×12): qty 1
  Filled 2020-06-12: qty 7
  Filled 2020-06-12 (×4): qty 1

## 2020-06-12 MED ORDER — RISPERIDONE 2 MG PO TABS
4.0000 mg | ORAL_TABLET | Freq: Every day | ORAL | Status: DC
  Administered 2020-06-12 – 2020-06-25 (×14): 4 mg via ORAL
  Filled 2020-06-12 (×20): qty 2

## 2020-06-12 MED ORDER — NICOTINE POLACRILEX 2 MG MT GUM
2.0000 mg | CHEWING_GUM | OROMUCOSAL | Status: DC | PRN
  Administered 2020-06-12: 2 mg via ORAL
  Filled 2020-06-12 (×2): qty 1

## 2020-06-12 MED ORDER — BENZTROPINE MESYLATE 1 MG PO TABS
1.0000 mg | ORAL_TABLET | Freq: Two times a day (BID) | ORAL | Status: DC | PRN
  Filled 2020-06-12: qty 14

## 2020-06-12 NOTE — Progress Notes (Signed)
Adult Psychoeducational Group Note  Date:  06/12/2020 Time:  9:35 PM  Group Topic/Focus:  Wrap-Up Group:   The focus of this group is to help patients review their daily goal of treatment and discuss progress on daily workbooks.  Participation Level:  Did Not Attend  Participation Quality:  Did Not Attend  Affect:  Did Not Attend  Cognitive:  Did Not Attend  Insight: None  Engagement in Group:  Did Not Attend  Modes of Intervention:  Did Not Attend  Additional Comments:  Pt did not attend evening wrap up group tonight.  Felipa Furnace 06/12/2020, 9:35 PM

## 2020-06-12 NOTE — Progress Notes (Signed)
   06/11/20 2100  Psych Admission Type (Psych Patients Only)  Admission Status Involuntary  Psychosocial Assessment  Patient Complaints Hyperactivity;Insomnia  Eye Contact Fair  Facial Expression Animated  Affect Labile;Silly  Speech Rapid;Tangential;Pressured  Interaction Childlike;Intrusive  Motor Activity Fidgety;Hyperactive;Restless  Appearance/Hygiene Unremarkable  Behavior Characteristics Cooperative;Fidgety;Hyperactive  Mood Preoccupied  Thought Process  Coherency WDL  Content Religiosity;Paranoia;Delusions  Delusions Religious;Persecutory;Grandeur  Perception Hallucinations  Hallucination Auditory (he denies auditory hallucination but is clearly responding to internal stimuli.)  Judgment Poor  Confusion WDL  Danger to Self  Current suicidal ideation? Denies  Danger to Others  Danger to Others None reported or observed  Jay Woods has been hyperactive this evening.  He denied SI/HI or A/V hallucinations although her was clearly responding to internal stimuli, talking loudly in his room.  He is hyper verbal, hyper religious and tangential.   He talked about the police have been shocking him through his ankle monitor.  He is very childlike and had to be redirected multiple times for running in the hall.  He had difficulty falling asleep tonight obtained Trazodone order by NP.  He took 2 doses of trazodone and then required Zyprexa later for increasing agitation.  Q 15 minute checks maintained for safety.  We will continue to monitor the progress towards his goals.

## 2020-06-12 NOTE — Tx Team (Signed)
Interdisciplinary Treatment and Diagnostic Plan Update  06/12/2020 Time of Session: 9:40am Jay Woods MRN: [id number]  Principal Diagnosis: Substance induced mood disorder (HCC)  Secondary Diagnoses: Principal Problem:   Substance induced mood disorder (HCC) Active Problems:   Cannabis abuse with cannabis-induced disorder (HCC)   Intermittent explosive disorder in adult   Psychosis (HCC)   Current Medications:  Current Facility-Administered Medications  Medication Dose Route Frequency Provider Last Rate Last Admin  . acetaminophen (TYLENOL) tablet 650 mg  650 mg Oral Q6H PRN Laubach, Katherine S, MD      . alum & mag hydroxide-simeth (MAALOX/MYLANTA) 200-200-20 MG/5ML suspension 30 mL  30 mL Oral Q4H PRN Laubach, Katherine S, MD      . benztropine (COGENTIN) tablet 1 mg  1 mg Oral BID Laubach, Katherine S, MD   1 mg at 06/12/20 0833  . divalproex (DEPAKOTE ER) 24 hr tablet 500 mg  500 mg Oral QHS Maurer, Sheila M, MD      . magnesium hydroxide (MILK OF MAGNESIA) suspension 30 mL  30 mL Oral Daily PRN Laubach, Katherine S, MD      . OLANZapine zydis (ZYPREXA) disintegrating tablet 10 mg  10 mg Oral Q8H PRN Laubach, Katherine S, MD   10 mg at 06/12/20 0327   And  . ziprasidone (GEODON) injection 20 mg  20 mg Intramuscular PRN Laubach, Katherine S, MD      . risperiDONE (RISPERDAL) tablet 2 mg  2 mg Oral q AM Maurer, Sheila M, MD   2 mg at 06/12/20 0717  . risperiDONE (RISPERDAL) tablet 3 mg  3 mg Oral QHS Maurer, Sheila M, MD      . traZODone (DESYREL) tablet 50 mg  50 mg Oral QHS,MR X 1 White, Patrice L, NP   50 mg at 06/11/20 2351   PTA Medications: No medications prior to admission.    Patient Stressors:    Patient Strengths:    Treatment Modalities: Medication Management, Group therapy, Case management,  1 to 1 session with clinician, Psychoeducation, Recreational therapy.   Physician Treatment Plan for Primary Diagnosis: Substance induced mood disorder  (HCC) Long Term Goal(s): Improvement in symptoms so as ready for discharge Improvement in symptoms so as ready for discharge   Short Term Goals: Ability to identify changes in lifestyle to reduce recurrence of condition will improve Ability to verbalize feelings will improve Ability to demonstrate self-control will improve Ability to identify and develop effective coping behaviors will improve Ability to maintain clinical measurements within normal limits will improve Compliance with prescribed medications will improve Ability to identify triggers associated with substance abuse/mental health issues will improve Ability to identify changes in lifestyle to reduce recurrence of condition will improve Ability to verbalize feelings will improve Ability to demonstrate self-control will improve Ability to identify and develop effective coping behaviors will improve Ability to maintain clinical measurements within normal limits will improve Compliance with prescribed medications will improve Ability to identify triggers associated with substance abuse/mental health issues will improve  Medication Management: Evaluate patient's response, side effects, and tolerance of medication regimen.  Therapeutic Interventions: 1 to 1 sessions, Unit Group sessions and Medication administration.  Evaluation of Outcomes: Not Met  Physician Treatment Plan for Secondary Diagnosis: Principal Problem:   Substance induced mood disorder (HCC) Active Problems:   Cannabis abuse with cannabis-induced disorder (HCC)   Intermittent explosive disorder in adult   Psychosis (HCC)  Long Term Goal(s): Improvement in symptoms so as ready for discharge Improvement in symptoms so as   ready for discharge   Short Term Goals: Ability to identify changes in lifestyle to reduce recurrence of condition will improve Ability to verbalize feelings will improve Ability to demonstrate self-control will improve Ability to identify  and develop effective coping behaviors will improve Ability to maintain clinical measurements within normal limits will improve Compliance with prescribed medications will improve Ability to identify triggers associated with substance abuse/mental health issues will improve Ability to identify changes in lifestyle to reduce recurrence of condition will improve Ability to verbalize feelings will improve Ability to demonstrate self-control will improve Ability to identify and develop effective coping behaviors will improve Ability to maintain clinical measurements within normal limits will improve Compliance with prescribed medications will improve Ability to identify triggers associated with substance abuse/mental health issues will improve     Medication Management: Evaluate patient's response, side effects, and tolerance of medication regimen.  Therapeutic Interventions: 1 to 1 sessions, Unit Group sessions and Medication administration.  Evaluation of Outcomes: Not Met   RN Treatment Plan for Primary Diagnosis: Substance induced mood disorder ([place]) Long Term Goal(s): Knowledge of disease and therapeutic regimen to maintain health will improve  Short Term Goals: Ability to remain free from injury will improve, Ability to participate in decision making will improve, Ability to verbalize feelings will improve, Ability to disclose and discuss suicidal ideas and Ability to identify and develop effective coping behaviors will improve  Medication Management: RN will administer medications as ordered by provider, will assess and evaluate patient's response and provide education to patient for prescribed medication. RN will report any adverse and/or side effects to prescribing provider.  Therapeutic Interventions: 1 on 1 counseling sessions, Psychoeducation, Medication administration, Evaluate responses to treatment, Monitor vital signs and CBGs as ordered, Perform/monitor CIWA, COWS, AIMS and  Fall Risk screenings as ordered, Perform wound care treatments as ordered.  Evaluation of Outcomes: Not Met   LCSW Treatment Plan for Primary Diagnosis: Substance induced mood disorder ([place]) Long Term Goal(s): Safe transition to appropriate next level of care at discharge, Engage patient in therapeutic group addressing interpersonal concerns.  Short Term Goals: Engage patient in aftercare planning with referrals and resources, Increase social support, Increase emotional regulation, Facilitate acceptance of mental health diagnosis and concerns, Identify triggers associated with mental health/substance abuse issues and Increase skills for wellness and recovery  Therapeutic Interventions: Assess for all discharge needs, 1 to 1 time with Social worker, Explore available resources and support systems, Assess for adequacy in community support network, Educate family and significant other(s) on suicide prevention, Complete Psychosocial Assessment, Interpersonal group therapy.  Evaluation of Outcomes: Not Met   Progress in Treatment: Attending groups: Yes. Participating in groups: Yes. Taking medication as prescribed: Yes. Toleration medication: Yes. Family/Significant other contact made: Yes, individual(s) contacted:  Mother  Patient understands diagnosis: No. Discussing patient identified problems/goals with staff: Yes. Medical problems stabilized or resolved: Yes. Denies suicidal/homicidal ideation: Yes. Issues/concerns per patient self-inventory: No.   New problem(s) identified: No, Describe:  None   New Short Term/Long Term Goal(s): medication stabilization, elimination of SI thoughts, development of comprehensive mental wellness plan.   Patient Goals:  "To help myself and other people"  Discharge Plan or Barriers: Patient recently admitted. CSW will continue to follow and assess for appropriate referrals and possible discharge planning.   Reason for Continuation of Hospitalization:  Delusions  Hallucinations Medication stabilization  Estimated Length of Stay: 3 to 5 days   Attendees: Patient: Jay Woods 06/12/2020   Physician: Melba Coon, MD 06/12/2020  Nursing:  06/12/2020   RN Care Manager: 06/12/2020   Social Worker:  , LCSWA 06/12/2020   Recreational Therapist:  06/12/2020   Other:  06/12/2020   Other:  06/12/2020   Other: 06/12/2020     Scribe for Treatment Team:  M , LCSWA 06/12/2020 11:34 AM 

## 2020-06-12 NOTE — Progress Notes (Signed)
Recreation Therapy Notes  Date: 4.20.22 Time: 1000 Location: 500 Hall Dayroom  Group Topic: Leisure Education  Goal Area(s) Addresses:  Patient will identify positive leisure and recreation activities.  Patient will identify one positive benefit of participation in leisure activities.   Behavioral Response: Minimal  Intervention: Group Game  Activity: Leisure IT trainer. Patients took turns drawing a picture on the white board while the rest of the group tried to guess what the picture was.  The person drawing could not talk or write words on the board.  Patients got one minute to guess the drawing, the person who gets the correct answer gets the next turn.  Education:  Teacher, English as a foreign language, Special educational needs teacher, Discharge Planning  Education Outcome: Acknowledges education/In group clarification offered/Needs additional education  Clinical Observations/Feedback: Pt came into group late and drowsy.  Pt attempted to get the rules of the game.  Pt was able to become more alert as the game went on and became more involved in the activity.   Caroll Rancher, LRT/CTRS    Caroll Rancher A 06/12/2020 12:06 PM

## 2020-06-12 NOTE — Progress Notes (Signed)
Pt up in his room having conversations with people not sen by staff, pt getting agitated, so pt given PRN Zyprexa per [facility]

## 2020-06-12 NOTE — [facility] Group Notes (Signed)
LCSW Group Therapy Note   06/12/2020 1:15pm   Type of Therapy and Topic:  Group Therapy:  Positive Affirmations   Participation Level:  Active  Description of Group: This group addressed positive affirmation toward self and others. Patients went around the room and identified two positive things about themselves and two positive things about a peer in the room. Patients reflected on how it felt to share something positive with others, to identify positive things about themselves, and to hear positive things from others. Patients were encouraged to have a daily reflection of positive characteristics or circumstances.  Therapeutic Goals 1. Patient will verbalize two of their positive qualities 2. Patient will demonstrate empathy for others by stating two positive qualities about a peer in the group 3. Patient will verbalize their feelings when voicing positive self affirmations and when voicing positive affirmations of others 4. Patients will discuss the potential positive impact on their wellness/recovery of focusing on positive traits of self and others.  Summary of Patient Progress: Patient attended and participated in group. Patient was observed to be interacting with peers.    Therapeutic Modalities Cognitive Behavioral Therapy Motivational Interviewing  Otelia Santee, LCSW 06/12/2020 1:26 PM

## 2020-06-12 NOTE — [facility] Group Notes (Signed)
[facility] Group Notes:  (Nursing/MHT/Case Management/Adjunct)  Date:  06/12/2020  Time:  10:28 AM  Type of Therapy:  Group Therapy  Participation Level:  Active  Participation Quality:  Appropriate  Affect:  Appropriate  Cognitive:  Alert and Appropriate  Insight:  Appropriate and Good  Engagement in Group:  Engaged  Modes of Intervention:  Orientation  Summary of Progress/Problems: His goal is to be able to become a better man and to take his medications.   Jawanda Passey J Kirtan Sada 06/12/2020, 10:28 AM

## 2020-06-12 NOTE — Progress Notes (Signed)
[facility] MD Progress Note  06/12/2020 4:23 PM Jay Woods  MRN:  [id number]    Subjective: "I just want to help myself so that I can help others.  I want to help my mom, and play soccer."  Principal Problem: Substance induced mood disorder (HCC) Diagnosis: Principal Problem:   Substance induced mood disorder (HCC) Active Problems:   Cannabis abuse with cannabis-induced disorder (HCC)   Intermittent explosive disorder in adult   Psychosis [facility])   Patient is discussed during treatment team, nurses notes are reviewed.  Patient received trazodone at bedtime and required as needed Zyprexa dose at 3:27 AM due to restlessness, agitation, and psychosis.  Patient achieved 1.75 hours of sleep overnight in reviewing MAR, patient did not receive Risperdal dose last night due to order change.  He was started on Depakote for mood stabilization.  Total Time spent with patient: 35 minutes   HPI: Jay Woods is a 20 y.o. male with a history of intermittent explosive disorder, cannabis abuse, and cannabis induced psychosis.  He continues to exhibit manic behavior with only 1.75 hours of sleep last night.  He states that even though he did not sleep, when he did sleep he slept well.  Patient states that he does feel sleepy during the day with medications that he is administered.  His mood continues to be labile, and intermittently through the course of interview today he will begin crying, particularly when discussing his mother, and "his baby mama who said she would always be there for him, but he does not know where she is."  Patient is denying auditory or visual hallucinations today as he did previously at initial intake and on history intake.  He now describes feeling a sense of protection from God and other people.  Patient continues to be religiously preoccupied, and speaks about a show called [employer] raises Deon, where children are having electrical experiences.  He states that this show is  upsetting to him, as God would not use electricity on children.  Patient denies any suicidal or homicidal ideation, plan, or intent.   Past Psychiatric History:  intermittent explosive disorder, cannabis abuse, and past hospitalizations for cannabis induced psychosis.  He presented to [facility] on 06/11/2020 for depressed mood and bizarre thoughts.  Per collateral obtained at initial assessment he had been fine until 4 days prior to presenting to the [facility].   Past Medical History:  Past Medical History:  Diagnosis Date  . Eczema    History reviewed. No pertinent surgical history. Family History: History reviewed. No pertinent family history. Family Psychiatric  History: None Social History:  Social History   Substance and Sexual Activity  Alcohol Use Yes  . Alcohol/week: 1.0 standard drink  . Types: 1 Cans of beer per week   Comment: twice/week     Social History   Substance and Sexual Activity  Drug Use Yes  . Types: Marijuana    Social History   Socioeconomic History  . Marital status: Single    Spouse name: Not on file  . Number of children: Not on file  . Years of education: Not on file  . Highest education level: Not on file  Occupational History  . Not on file  Tobacco Use  . Smoking status: Former Smoker    Packs/day: 1.00    Years: 5.00    Pack years: 5.00  . Smokeless tobacco: Former Clinical biochemist  . Vaping Use: Some days  . Substances: CBD  Substance and Sexual Activity  .  Alcohol use: Yes    Alcohol/week: 1.0 standard drink    Types: 1 Cans of beer per week    Comment: twice/week  . Drug use: Yes    Types: Marijuana  . Sexual activity: Not Currently  Other Topics Concern  . Not on file  Social History Narrative  . Not on file   Social Determinants of Health   Financial Resource Strain: Not on file  Food Insecurity: Not on file  Transportation Needs: Not on file  Physical Activity: Not on file  Stress: Not on file  Social Connections: Not on  file   Additional Social History:                         Sleep: Poor  Appetite:  Fair  Current Medications: Current Facility-Administered Medications  Medication Dose Route Frequency Provider Last Rate Last Admin  . acetaminophen (TYLENOL) tablet 650 mg  650 mg Oral Q6H PRN Estella Husk, MD      . alum & mag hydroxide-simeth (MAALOX/MYLANTA) 200-200-20 MG/5ML suspension 30 mL  30 mL Oral Q4H PRN Estella Husk, MD      . benztropine (COGENTIN) tablet 1 mg  1 mg Oral BID PRN Mariel Craft, MD      . divalproex (DEPAKOTE ER) 24 hr tablet 500 mg  500 mg Oral QHS Mariel Craft, MD      . loratadine (CLARITIN) tablet 10 mg  10 mg Oral Daily Mariel Craft, MD      . magnesium hydroxide (MILK OF MAGNESIA) suspension 30 mL  30 mL Oral Daily PRN Estella Husk, MD      . OLANZapine zydis (ZYPREXA) disintegrating tablet 10 mg  10 mg Oral Q8H PRN Estella Husk, MD   10 mg at 06/12/20 0327   And  . ziprasidone (GEODON) injection 20 mg  20 mg Intramuscular PRN Estella Husk, MD      . risperiDONE (RISPERDAL) tablet 4 mg  4 mg Oral QHS Mariel Craft, MD      . traZODone (DESYREL) tablet 50 mg  50 mg Oral QHS,MR X 1 White, Patrice L, NP   50 mg at 06/11/20 2351    Lab Results: No results found for this or any previous visit (from the past 48 hour(s)).  Blood Alcohol level:  Lab Results  Component Value Date   ETH <10 06/09/2020   ETH <10 12/06/2018    Metabolic Disorder Labs: Lab Results  Component Value Date   HGBA1C 5.6 06/09/2020   MPG 114 06/09/2020   MPG 99.67 02/28/2017   Lab Results  Component Value Date   PROLACTIN 4.4 06/09/2020   Lab Results  Component Value Date   CHOL 142 06/09/2020   TRIG 51 06/09/2020   HDL 51 06/09/2020   CHOLHDL 2.8 06/09/2020   VLDL 10 06/09/2020   LDLCALC 81 06/09/2020   LDLCALC 98 02/28/2017    Physical Findings: AIMS:  , ,  ,  ,    CIWA:    COWS:     Musculoskeletal: Strength &  Muscle Tone: within normal limits Gait & Station: normal Patient leans: N/A  Psychiatric Specialty Exam:  Presentation  General Appearance: Casual; Neat  Eye Contact:Fair  Speech:Normal Rate  Speech Volume:Normal  Handedness:Right   Mood and Affect  Mood:Anxious; Dysphoric; Labile  Affect:Constricted; Tearful; Labile   Thought Process  Thought Processes:Disorganized  Descriptions of Associations:Loose  Orientation:Partial  Thought Content:Delusions; Illogical; Rumination  History of Schizophrenia/Schizoaffective disorder:No  Duration of Psychotic Symptoms:Less than six months  Hallucinations:Hallucinations: Other (comment) (Feels God's presence and the protiection of a lot of people; feels butterflies when he reads the Bible and gets direct messages from God when reading the Bible) Description of Auditory Hallucinations: Hears God's voice Description of Visual Hallucinations: Sees the pages of the Bible coming to life, and objects being talked about coming from the pages.  Ideas of Reference:Delusions (Beleives he has a "baby mama", but not certain if she actually has a baby.  He does not know where she is, but states that he "is always there for her.")  Suicidal Thoughts:Suicidal Thoughts: No  Homicidal Thoughts:Homicidal Thoughts: No   Sensorium  Memory:Immediate Poor; Immediate Fair; Recent Poor; Remote Poor  Judgment:Impaired  Insight:Lacking   Executive Functions  Concentration:Poor  Attention Span:Fair  Recall:Fair  Fund of Knowledge:Fair  Language:Good   Psychomotor Activity  Psychomotor Activity:Psychomotor Activity: Restlessness   Assets  Assets:Communication Skills; Desire for Improvement; Resilience; Social Support   Sleep  Sleep:Sleep: Poor Number of Hours of Sleep: 1.75    Physical Exam: Physical Exam Vitals and nursing note reviewed.  Constitutional:      Appearance: Normal appearance.  HENT:     Head: Normocephalic  and atraumatic.     Right Ear: External ear normal.     Left Ear: External ear normal.     Nose: Nose normal.  Eyes:     Extraocular Movements: Extraocular movements intact.  Cardiovascular:     Rate and Rhythm: Normal rate.  Pulmonary:     Effort: Pulmonary effort is normal. No respiratory distress.  Musculoskeletal:        General: Normal range of motion.     Cervical back: Normal range of motion.  Neurological:     General: No focal deficit present.     Mental Status: He is alert and oriented to person, place, and time.    Review of Systems  Constitutional:       Feels slightly sedated during the day  Respiratory: Negative.   Cardiovascular: Negative.   Gastrointestinal: Negative.   Musculoskeletal: Negative.   Neurological: Negative.   Psychiatric/Behavioral: Positive for depression, hallucinations and substance abuse. Negative for memory loss and suicidal ideas. The patient is nervous/anxious and has insomnia.    Blood pressure (!) 146/70, pulse 74, temperature 97.6 F (36.4 C), temperature source Oral, resp. rate 18, height 5\' 8"  (1.727 m), weight 56.7 kg, SpO2 100 %. Body mass index is 19.01 kg/m.   Treatment Plan Summary: Daily contact with patient to assess and evaluate symptoms and progress in treatment and Medication management   Assessment:  [name] is a 20 y.o. 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  He has had 3 prior admissions with the last being in 2020.   Diagnosis: Substance induced mood disorder (HCC)    Pertinent findings today: Vladimir FasterJairo Artica-Limones is a 20 y.o. 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  Patient continues to be extremely labile and tearful, however has less auditory and visual hallucinations.  He does not appear to be responding to internal stimuli today, however continues to be delusional in regards to a "baby mama".  He remains religiously  preoccupied and worries about his mother, believing that he needs to be her caregiver and provide for her financially.    Treatment Plan Summary: Daily contact with patient to assess and evaluate symptoms and progress in treatment.  Scheduled medications/labs: -Patient platelet loading dose of Depakote ER 750 mg on 06/11/2020 -Continue Depakote ER 500 mg daily at bedtime for mood stabilization  -Depakote level to be drawn on 06/16/2020 -Increase Risperdal to 4 mg at bedtime (changed from twice daily dosing due to sedation during the day)  -Start loratadine 10 mg daily for allergy symptoms    PRN's : -Trazodone 50 mg daily at bedtime and may repeat 1 time as needed for sleep -Cogentin 1 mg twice daily as needed for EPS reaction, tremors -Agitation protocol in place - Milk of magnesia - Mylanta -Tylenol       Psychosocial:   1. Encouragement to attend group therapies. 2. Encourage patient to learn and implement coping strategies to avoid need for PRN medications. 3. Encouragement for medication compliance. 4. Disposition in progress, appreciate social work assistance. 5. Patient agreeable to family meeting-appreciate social work assistance in obtaining collateral per patient baseline to assist in clarifying diagnosis as well as for discharge planning.   Develop treatment plan to decrease risk of relapse upon discharge and the need for readmission.  Psycho-social education regarding relapse prevention and self care.  Health care follow up as needed for medical problems.  Review, reconcile, and reinstate any pertinent home medications for other health issues where appropriate. Call for consults with hospitalist for any additional specialty patient care services as needed  Mariel Craft, MD 06/12/2020, 4:23 PM

## 2020-06-12 NOTE — Progress Notes (Addendum)
   06/12/20 2100  Psych Admission Type (Psych Patients Only)  Admission Status Involuntary  Psychosocial Assessment  Patient Complaints None  Eye Contact Fair  Facial Expression Animated  Affect Anxious  Speech Rapid;Pressured  Interaction Childlike;Intrusive  Motor Activity Fidgety;Restless  Appearance/Hygiene Unremarkable  Behavior Characteristics Cooperative  Mood Anxious;Pleasant  Thought Process  Coherency WDL  Content Religiosity;Paranoia;Delusions  Delusions Religious  Perception Hallucinations  Hallucination None reported or observed  Judgment Poor  Confusion None  Danger to Self  Current suicidal ideation? Denies  Danger to Others  Danger to Others None reported or observed   Pt seen in dayroom. Pt denies SI, HI, AVH and pain. Pt fidgety with rapid speech. Previous shift report was that pt has not been sleeping. Pt exhibiting religiosity. Pt states that he wants to play soccer and make his mother proud of him. "Do you think my mother loves me? I know she does, right?" Pt given support and encouragement. "She told me not to talk too much cause people don't want to hear that." Pt is pleasant and takes meds as prescribed.

## 2020-06-13 DIAGNOSIS — F1994 Other psychoactive substance use, unspecified with psychoactive substance-induced mood disorder: Secondary | ICD-10-CM | POA: Diagnosis not present

## 2020-06-13 NOTE — Progress Notes (Signed)
Recreation Therapy Notes  Date: 4.21.22 Time: 1000 Location: 500 Hall Dayroom   Group Topic: Self Esteem    Goal Area(s) Addresses:  Patient will appropriately identify what self esteem is.  Patient will create a shield of armor describing themselves.  Patient will successfully identify positive attributes about themselves.    Behavioral Response: Engaged  Intervention / Activity: Self-Esteem Shield. Patient attended a recreation therapy group session focused on self esteem. Patient identified what self esteem is, and why it is important to have high self esteem during group discussion. LRT described to patients that their shield was to reflect the things that make them unique.  Patients could highlight accomplishments, people/pets that are important to them, things they have yet to accomplish, etc.   Patients were provided sheets with the shield printed on them and colored pencils, markers and crayons to complete the activity. LRT also played music for patients as they worked on their shields.  Education: Self esteem, Communication, Positive self-talk, Discharge Planning   Education Outcome: Acknowledges education/Verbalizes understanding of Education/In group clarification offered/Needs further education   Comments: Pt was social and attentive to peers.  Pt took his time completing his shield.  Pt primary focus on shield was love.  Pt drew a heart in the center of the shield and wrote each letter of love in one quadrant of the shield.  In addition to the hear, pt drew a star for the [place] and a spider.  Pt was bright and appropriate during group session.     Caroll Rancher, LRT/CTRS     Caroll Rancher A 06/13/2020 11:25 AM

## 2020-06-13 NOTE — Progress Notes (Addendum)
   06/13/20 2037  Psych Admission Type (Psych Patients Only)  Admission Status Involuntary  Psychosocial Assessment  Patient Complaints None  Eye Contact Fair  Facial Expression Animated  Affect Anxious  Speech Rapid;Pressured  Interaction Childlike  Motor Activity Fidgety;Restless  Appearance/Hygiene Unremarkable  Behavior Characteristics Cooperative  Mood Pleasant  Thought Process  Coherency WDL  Content Religiosity;Delusions  Delusions Religious  Perception Hallucinations  Hallucination None reported or observed  Judgment Poor  Confusion None  Danger to Self  Current suicidal ideation? Denies  Danger to Others  Danger to Others None reported or observed   Pt seen interacting in the dayroom. Pt states that he went outside today for recreation and enjoyed himself. Pt denied SI, HI, AVH and pain. Says he spoke with his mother today. She asked him was he feeling better. He told this Clinical research associate that he is feeling better. "I am so grateful to God. I trust you guys here and I feel safe. There are angels here too." Pt told to try to avoid napping during the day so he can sleep well at night.

## 2020-06-13 NOTE — Progress Notes (Signed)
   06/13/20 1000  Psych Admission Type (Psych Patients Only)  Admission Status Involuntary  Psychosocial Assessment  Patient Complaints None  Eye Contact Fair  Facial Expression Animated  Affect Anxious  Speech Rapid;Pressured  Interaction Childlike;Intrusive  Motor Activity Fidgety;Restless  Appearance/Hygiene Unremarkable  Behavior Characteristics Cooperative  Mood Pleasant  Thought Process  Coherency WDL  Content Religiosity;Paranoia;Delusions  Delusions Religious  Perception Hallucinations  Hallucination None reported or observed  Judgment Poor  Confusion None  Danger to Self  Current suicidal ideation? Denies  Danger to Others  Danger to Others None reported or observed

## 2020-06-13 NOTE — Progress Notes (Signed)
Adult Psychoeducational Group Note  Date:  06/13/2020 Time:  8:43 PM  Group Topic/Focus:  Wrap-Up Group:   The focus of this group is to help patients review their daily goal of treatment and discuss progress on daily workbooks.  Participation Level:  Active  Participation Quality:  Appropriate  Affect:  Anxious  Cognitive:  Disorganized and Confused  Insight: Limited  Engagement in Group:  Limited and Poor  Modes of Intervention:  Discussion  Additional Comments:  Pt stated his goal for today was to focus on his treatment plan. Pt stated he accomplished his goal today. Pt stated he talked with his doctor and social worker about his care today. Pt rated his overall day a 10. Pt stated he was able to contact his mother today which improved his overalltoday. Pt stated he felt better about himself today. Pt stated he was able to attend all meals. Pt stated he took all medications provided today. Pt stated his appetite was pretty good today. Pt rated sleep last night was pretty good. Pt stated the goal tonight was to get some rest. Pt stated he had no physical pain today. Pt deny visual hallucinations and auditory issues tonight. Pt denies thoughts of harming himself or others. Pt stated he would alert staff if anything changed.  Felipa Furnace 06/13/2020, 8:43 PM

## 2020-06-13 NOTE — Progress Notes (Signed)
[facility] MD Progress Note  06/13/2020 7:23 PM Jay Woods  MRN:  [id number]   Subjective:  "My day has been amazing. The people I have been around are really good."   On evaluation today, patient had just returned from being outside with peers.  Patient reported that he had a "great" time outside.  He reports no anxiety, depression, or anger.  Patient is very pleasant, dressed neatly. Patient reports "I just miss somebody who I really care about."  Patient starts to cry a little when he says that her (the person that means a lot to him)  father died-he meant a lot to me. Then patient quickly smiles and says "God has my heart and he saved me."  Patient says he can hear a voice of his "baby mamma" (same person who means a lot to him),  who he then says he is not sure if her baby is his "but it might be" and her voice is comforting and it does not scare me.  Patient is tangential and disorganized in thought expression.  Expresses hyper religious thoughts.  Patient denies any visitors or phone calls.  He states that he is taking his medication and he reports no adverse side effects.  Patient reports that he enjoyed a group in which foods that are good for you were discussed.  Patient denies suicidal or homicidal ideation.  Denies paranoia.  Denies visual hallucinations.  Patient also does not endorse audio hallucinations as hallucinations, but "that is really her voice."   Principal Problem: Substance induced mood disorder (HCC) Diagnosis: Principal Problem:   Substance induced mood disorder (HCC) Active Problems:   Cannabis abuse with cannabis-induced disorder (HCC)   Intermittent explosive disorder in adult   Psychosis (HCC)  Total Time spent with patient: 20 minutes  Past Psychiatric History: Per H&P: "intermittent explosive disorder, cannabis abuse, and past hospitalizations for cannabis induced psychosis.  He presented to [facility] on 06/11/2020 for depressed mood and bizarre thoughts.  Per  collateral obtained at initial assessment he had been fine until 4 days prior to presenting to the [facility]."  Past Medical History:  Past Medical History:  Diagnosis Date  . Eczema    History reviewed. No pertinent surgical history. Family History: History reviewed. No pertinent family history.   Family Psychiatric  History: Per H&P: None  Social History:  Social History   Substance and Sexual Activity  Alcohol Use Yes  . Alcohol/week: 1.0 standard drink  . Types: 1 Cans of beer per week   Comment: twice/week     Social History   Substance and Sexual Activity  Drug Use Yes  . Types: Marijuana    Social History   Socioeconomic History  . Marital status: Single    Spouse name: Not on file  . Number of children: Not on file  . Years of education: Not on file  . Highest education level: Not on file  Occupational History  . Not on file  Tobacco Use  . Smoking status: Former Smoker    Packs/day: 1.00    Years: 5.00    Pack years: 5.00  . Smokeless tobacco: Former Clinical biochemist  . Vaping Use: Some days  . Substances: CBD  Substance and Sexual Activity  . Alcohol use: Yes    Alcohol/week: 1.0 standard drink    Types: 1 Cans of beer per week    Comment: twice/week  . Drug use: Yes    Types: Marijuana  . Sexual activity: Not Currently  Other Topics Concern  . Not on file  Social History Narrative  . Not on file   Social Determinants of Health   Financial Resource Strain: Not on file  Food Insecurity: Not on file  Transportation Needs: Not on file  Physical Activity: Not on file  Stress: Not on file  Social Connections: Not on file   Additional Social History:    Sleep: Poor, per documentation. Pt says he slept "so good"  Appetite:  Good  Current Medications: Current Facility-Administered Medications  Medication Dose Route Frequency Provider Last Rate Last Admin  . acetaminophen (TYLENOL) tablet 650 mg  650 mg Oral Q6H PRN Estella Husk, MD       . alum & mag hydroxide-simeth (MAALOX/MYLANTA) 200-200-20 MG/5ML suspension 30 mL  30 mL Oral Q4H PRN Estella Husk, MD      . benztropine (COGENTIN) tablet 1 mg  1 mg Oral BID PRN Mariel Craft, MD      . divalproex (DEPAKOTE ER) 24 hr tablet 500 mg  500 mg Oral QHS Mariel Craft, MD   500 mg at 06/12/20 2037  . loratadine (CLARITIN) tablet 10 mg  10 mg Oral Daily Mariel Craft, MD   10 mg at 06/13/20 0820  . magnesium hydroxide (MILK OF MAGNESIA) suspension 30 mL  30 mL Oral Daily PRN Estella Husk, MD      . nicotine polacrilex (NICORETTE) gum 2 mg  2 mg Oral PRN White, Patrice L, NP   2 mg at 06/12/20 2114  . OLANZapine zydis (ZYPREXA) disintegrating tablet 10 mg  10 mg Oral Q8H PRN Estella Husk, MD   10 mg at 06/13/20 1719   And  . ziprasidone (GEODON) injection 20 mg  20 mg Intramuscular PRN Estella Husk, MD      . risperiDONE (RISPERDAL) tablet 4 mg  4 mg Oral QHS Mariel Craft, MD   4 mg at 06/12/20 2035  . traZODone (DESYREL) tablet 50 mg  50 mg Oral QHS,MR X 1 White, Patrice L, NP   50 mg at 06/12/20 2116    Lab Results: No results found for this or any previous visit (from the past 48 hour(s)).  Blood Alcohol level:  Lab Results  Component Value Date   ETH <10 06/09/2020   ETH <10 12/06/2018    Metabolic Disorder Labs: Lab Results  Component Value Date   HGBA1C 5.6 06/09/2020   MPG 114 06/09/2020   MPG 99.67 02/28/2017   Lab Results  Component Value Date   PROLACTIN 4.4 06/09/2020   Lab Results  Component Value Date   CHOL 142 06/09/2020   TRIG 51 06/09/2020   HDL 51 06/09/2020   CHOLHDL 2.8 06/09/2020   VLDL 10 06/09/2020   LDLCALC 81 06/09/2020   LDLCALC 98 02/28/2017    Physical Findings: AIMS:  , ,  ,  ,    CIWA:    COWS:     Musculoskeletal: Strength & Muscle Tone: within normal limits Gait & Station: normal Patient leans: N/A  Psychiatric Specialty Exam:  Presentation  General Appearance: Casual;  Neat  Eye Contact:Fair  Speech:Normal Rate  Speech Volume:Normal  Handedness:Right   Mood and Affect  Mood:Anxious; Dysphoric; Labile  Affect:Constricted; Tearful; Labile   Thought Process  Thought Processes:Disorganized  Descriptions of Associations:Loose  Orientation:Partial  Thought Content:Delusions; Illogical; Rumination  History of Schizophrenia/Schizoaffective disorder:No  Duration of Psychotic Symptoms:Less than six months  Hallucinations:Hallucinations: Other (comment) (Feels God's presence and the  protiection of a lot of people; feels butterflies when he reads the Bible and gets direct messages from God when reading the Bible)  Ideas of Reference:Delusions (Beleives he has a "baby mama", but not certain if she actually has a baby.  He does not know where she is, but states that he "is always there for her.")  Suicidal Thoughts:Suicidal Thoughts: No  Homicidal Thoughts:Homicidal Thoughts: No   Sensorium  Memory:Immediate Poor; Immediate Fair; Recent Poor; Remote Poor  Judgment:Impaired  Insight:Lacking   Executive Functions  Concentration:Poor  Attention Span:Fair  Recall:Fair  Fund of Knowledge:Fair  Language:Good   Psychomotor Activity  Psychomotor Activity:Psychomotor Activity: Restlessness   Assets  Assets:Communication Skills; Desire for Improvement; Resilience; Social Support   Sleep  Sleep:Sleep: Poor Number of Hours of Sleep: 1.75    Physical Exam: Physical Exam HENT:     Head: Normocephalic.     Nose: No congestion or rhinorrhea.  Eyes:     General:        Right eye: No discharge.        Left eye: No discharge.  Pulmonary:     Effort: Pulmonary effort is normal.  Musculoskeletal:        General: Normal range of motion.     Cervical back: Normal range of motion.  Neurological:     Mental Status: He is alert and oriented to person, place, and time.    Review of Systems  Psychiatric/Behavioral: Positive for  hallucinations (denies, but describeah). Negative for depression (denies), memory loss, substance abuse and suicidal ideas. The patient is not nervous/anxious and does not have insomnia.   All other systems reviewed and are negative.  Blood pressure 138/75, pulse 93, temperature (!) 97.5 F (36.4 C), temperature source Oral, resp. rate 18, height 5\' 8"  (1.727 m), weight 56.7 kg, SpO2 99 %. Body mass index is 19.01 kg/m.   Treatment Plan Summary: Daily contact with patient to assess and evaluate symptoms and progress in treatment and Medication management   #Mood Stabilization: -Continue Depakote ER 500 mg daily at bedtime for mood stabilization             -Depakote level to be drawn on 06/16/2020 -Continue Risperdal 4 mg at bedtime   #Allergy symptoms -Continue  loratadine 10 mg daily   #Smoking Cessation -Continue nicorette gum 2 mg prn  #PRN, Other  --Continue Tylenol 650 mg po every 6 hrs prn, mild pain --Continue MAALOX/MYLANTA 30 mL po every 4 hrs prn indigestion --Continue Milk of Magnesia 30 mL po daily prn mild constipation     PRN's Agitation : -Trazodone 50 mg daily at bedtime and may repeat 1 time as needed for sleep -Cogentin 1 mg twice daily as needed for EPS reaction, tremors -Agitation protocol in place  06/18/2020, NP, PMHNP-BC 06/13/2020, 7:23 PM

## 2020-06-13 NOTE — [facility] Group Notes (Signed)
Occupational Therapy Group Note Date: 06/13/2020 Group Topic/Focus: Brain Fitness  Group Description: Group encouraged increased social engagement and participation through discussion/activity focused on brain fitness. Patients were provided education on various brain fitness activities/strategies, with explanation provided on the qualifying factors including: one, that is has to be challenging/hard and two, it has to be something that you do not do every day. Patients engaged actively during group session in various brain fitness activities to increase attention, concentration, and problem-solving skills. Discussion followed with a focus on identifying the benefits of brain fitness activities as use for adaptive coping strategies and distraction.    Therapeutic Goal(s): Identify benefit(s) of brain fitness activities as use for adaptive coping and healthy distraction. Identify specific brain fitness activities to engage in as use for adaptive coping and healthy distraction. Participation Level: Active   Participation Quality: Independent   Behavior: Cooperative and Restless   Speech/Thought Process: Tangential   Affect/Mood: Full range   Insight: Impaired   Judgement: Impaired   Individualization: Chrishon was active in their participation of group discussion/activity. Pt presented as disorganized, restless, and tangential, though pleasantly interactive. Pt engaged actively in discussion and activities presenting, noting benefit of engaging in brain fitness activities.   Modes of Intervention: Activity, Discussion, Education, Orientation and Problem-solving  Patient Response to Interventions:  Attentive, Engaged and Receptive   Plan: Continue to engage patient in OT groups 2 - 3x/week.  06/13/2020  Donne Hazel, MOT, OTR/L

## 2020-06-13 NOTE — [facility] Group Notes (Signed)
[facility] Group Notes:  (Nursing/MHT/Case Management/Adjunct)  Date:  06/13/2020  Time:  10:32 AM  Type of Therapy:  Group Therapy  Participation Level:  Active  Participation Quality:  Appropriate  Affect:  Appropriate  Cognitive:  Alert and Appropriate  Insight:  Appropriate and Good  Engagement in Group:  Engaged  Modes of Intervention:  Orientation  Summary of Progress/Problems: His goal is continue to keep his faith and to make better decisions.   Shannell Mikkelsen J Anthoni Geerts 06/13/2020, 10:32 AM

## 2020-06-14 NOTE — [facility] Group Notes (Signed)
[facility] LCSW Group Therapy  06/14/2020 11:51 AM  Type of Therapy:  Group Therapy: Strengths Exploration   Participation Level:  Active  Participation Quality:  Appropriate and Sharing  Affect:  Excited  Cognitive:  Lacking  Insight:  Developing/Improving  Engagement in Therapy:  Distracting  Modes of Intervention:  Exploration  Summary of Progress/Problems: Patient attended and participated in group. Patient shared that helping others is a strength he has and admires in others. Pt was talking over other people during group, pt crawled on the floor, was restless and moving about a lot throughout group.    Delta Deshmukh A Jashaun Penrose 06/14/2020, 11:51 AM

## 2020-06-14 NOTE — Progress Notes (Signed)
   06/14/20 1210  Psych Admission Type (Psych Patients Only)  Admission Status Involuntary  Psychosocial Assessment  Patient Complaints Anxiety  Eye Contact Fair  Facial Expression Animated  Affect Anxious  Speech Rapid;Pressured  Interaction Childlike  Motor Activity Fidgety;Restless  Appearance/Hygiene Unremarkable  Behavior Characteristics Cooperative  Mood Pleasant  Thought Process  Coherency WDL  Content Religiosity;Delusions  Delusions Religious  Perception Hallucinations  Hallucination None reported or observed  Judgment Poor  Confusion None

## 2020-06-14 NOTE — Progress Notes (Addendum)
   06/14/20 2027  Psych Admission Type (Psych Patients Only)  Admission Status Involuntary  Psychosocial Assessment  Patient Complaints None  Eye Contact Fair  Facial Expression Animated  Affect Anxious  Speech Rapid;Pressured  Interaction Childlike  Motor Activity Fidgety;Restless  Appearance/Hygiene Unremarkable  Behavior Characteristics Cooperative  Mood Pleasant  Thought Process  Coherency WDL  Content WDL  Delusions Religious  Perception WDL  Hallucination None reported or observed  Judgment Poor  Confusion None  Danger to Self  Current suicidal ideation? Denies  Danger to Others  Danger to Others None reported or observed   Pt seen interacting in dayroom with peers. Pt denies SI, HI, AVH and pain. Pt talks about angels being here. Pt denies anxiety and depression. Still fidgety and restless at times. Takes meds as prescribed.

## 2020-06-14 NOTE — Progress Notes (Signed)
  D:  Patient presents with an animated and anxious affect. Patient interacts with child-like rapid, pressured speech. Patient reported feeling focused this AM. Patient attended groups and interacted with peers. Patients denies SI/HI and AVH. Patient verbally contracts to contact me before acting on any thoughts of hurting others or himself.   A:  Labs/Vitals monitored; Medication education provided; Patient supported emotionally; Patient asked to communicate his needs concerns, and questions.   R:  Patient remains safe with 15 minute checks. Will continue to monitor.

## 2020-06-14 NOTE — Progress Notes (Signed)
Recreation Therapy Notes  Date: 4.22.22 Time: 1000 Location: [address] Dayroom  Group Topic: Communication, Team Building, Problem Solving   Goal Area(s) Addresses:  Patient will effectively work with peer towards shared goal.  Patient will identify skill used to make activity successful.  Patient will identify how skills used during activity can be used to reach post d/c goals.   Behavioral Response: Engaged  Intervention: Cup Merchant navy officer bands with attached strings enough for each group member, 10 or more cups  Activity: Patient(s) were given a set of solo cups, a rubber band, and some tied strings. The objective is to build a pyramid with the cups by only using the rubber band and string to move the cups. After the activity the patient(s) are LRT debriefed and discussed what strategies worked, what didn't, and what lessons they can take from the activity and use in life post discharge.   Education Areas: Social Skills, Support System, Discharge Planning   Education Outcome: Acknowledges education  Clinical Observations/Feedback: Patient was bright and engaged during activity.  Patient would follow along with the suggestions of his peers.  Patient was appropriate and able to stay focused on the activity with minimal prompting.    Caroll Rancher, LRT/CTRS    Caroll Rancher A 06/14/2020 11:17 AM

## 2020-06-14 NOTE — [facility] Group Notes (Signed)
[facility] Group Notes:  (Nursing/MHT/Case Management/Adjunct)  Date:  06/14/2020  Time:  9:58 AM  Type of Therapy:  Group Therapy  Participation Level:  Active  Participation Quality:  Appropriate  Affect:  Appropriate  Cognitive:  Alert and Appropriate  Insight:  Appropriate  Engagement in Group:  Engaged  Modes of Intervention:  Orientation  Summary of Progress/Problems: His goal is to continue to get better each day and to pray more and to try his best to help others.   Isma Tietje J Alanee Ting 06/14/2020, 9:58 AM

## 2020-06-14 NOTE — Progress Notes (Signed)
[facility] MD Progress Note  06/14/2020 1:46 PM [name]  MRN:  [id number]    Subjective: "I'm feeling good, excited to see my mom."  Objective: Client seen and evaluated in person on the unit.  Prior to the assessment, he was participating actively in group.  No behavior issues on the unit, interacting appropriately with peers and staff.  He reports he feels excited and rambles at times about his father's work.  Denies suicidal/homicidal ideations, hallucinations.  "I slept like a baby" and appetite is "good".  Denies side effects from his medications.  No physical pain or discomfort, smiles frequently throughout the assessment.  Appears to be on the hypomanic end of the spectrum or continuing to have effects from vaping Delta 8, synthetic cannabis sold in vape stores.    Principal Problem: Cannabis abuse with cannabis-induced disorder (HCC) Diagnosis: Principal Problem:   Cannabis abuse with cannabis-induced disorder (HCC)  Total Time spent with patient: 15 minutes  Past Psychiatric History:  intermittent explosive disorder, cannabis abuse, and past hospitalizations for cannabis induced psychosis.  He presented to [facility] on 06/11/2020 for depressed mood and bizarre thoughts.  Per collateral obtained at initial assessment he had been fine until 4 days prior to presenting to the [facility].   Past Medical History:  Past Medical History:  Diagnosis Date  . Eczema    History reviewed. No pertinent surgical history. Family History: History reviewed. No pertinent family history. Family Psychiatric  History: None Social History:  Social History   Substance and Sexual Activity  Alcohol Use Yes  . Alcohol/week: 1.0 standard drink  . Types: 1 Cans of beer per week   Comment: twice/week     Social History   Substance and Sexual Activity  Drug Use Yes  . Types: Marijuana    Social History   Socioeconomic History  . Marital status: Single    Spouse name: Not on file  . Number of  children: Not on file  . Years of education: Not on file  . Highest education level: Not on file  Occupational History  . Not on file  Tobacco Use  . Smoking status: Former Smoker    Packs/day: 1.00    Years: 5.00    Pack years: 5.00  . Smokeless tobacco: Former Clinical biochemist  . Vaping Use: Some days  . Substances: CBD  Substance and Sexual Activity  . Alcohol use: Yes    Alcohol/week: 1.0 standard drink    Types: 1 Cans of beer per week    Comment: twice/week  . Drug use: Yes    Types: Marijuana  . Sexual activity: Not Currently  Other Topics Concern  . Not on file  Social History Narrative  . Not on file   Social Determinants of Health   Financial Resource Strain: Not on file  Food Insecurity: Not on file  Transportation Needs: Not on file  Physical Activity: Not on file  Stress: Not on file  Social Connections: Not on file   Additional Social History:                         Sleep: Poor  Appetite:  Fair  Current Medications: Current Facility-Administered Medications  Medication Dose Route Frequency Provider Last Rate Last Admin  . acetaminophen (TYLENOL) tablet 650 mg  650 mg Oral Q6H PRN Estella Husk, MD      . alum & mag hydroxide-simeth (MAALOX/MYLANTA) 200-200-20 MG/5ML suspension 30 mL  30 mL  Oral Q4H PRN Estella Husk, MD      . benztropine (COGENTIN) tablet 1 mg  1 mg Oral BID PRN Mariel Craft, MD      . divalproex (DEPAKOTE ER) 24 hr tablet 500 mg  500 mg Oral QHS Mariel Craft, MD   500 mg at 06/13/20 2047  . loratadine (CLARITIN) tablet 10 mg  10 mg Oral Daily Mariel Craft, MD   10 mg at 06/14/20 0754  . magnesium hydroxide (MILK OF MAGNESIA) suspension 30 mL  30 mL Oral Daily PRN Estella Husk, MD      . nicotine polacrilex (NICORETTE) gum 2 mg  2 mg Oral PRN White, Patrice L, NP   2 mg at 06/12/20 2114  . OLANZapine zydis (ZYPREXA) disintegrating tablet 10 mg  10 mg Oral Q8H PRN Estella Husk, MD    10 mg at 06/13/20 1719   And  . ziprasidone (GEODON) injection 20 mg  20 mg Intramuscular PRN Estella Husk, MD      . risperiDONE (RISPERDAL) tablet 4 mg  4 mg Oral QHS Mariel Craft, MD   4 mg at 06/13/20 2047  . traZODone (DESYREL) tablet 50 mg  50 mg Oral QHS,MR X 1 White, Patrice L, NP   50 mg at 06/13/20 2047    Lab Results: No results found for this or any previous visit (from the past 48 hour(s)).  Blood Alcohol level:  Lab Results  Component Value Date   ETH <10 06/09/2020   ETH <10 12/06/2018    Metabolic Disorder Labs: Lab Results  Component Value Date   HGBA1C 5.6 06/09/2020   MPG 114 06/09/2020   MPG 99.67 02/28/2017   Lab Results  Component Value Date   PROLACTIN 4.4 06/09/2020   Lab Results  Component Value Date   CHOL 142 06/09/2020   TRIG 51 06/09/2020   HDL 51 06/09/2020   CHOLHDL 2.8 06/09/2020   VLDL 10 06/09/2020   LDLCALC 81 06/09/2020   LDLCALC 98 02/28/2017    Physical Findings: AIMS: Facial and Oral Movements Muscles of Facial Expression: None, normal Lips and Perioral Area: None, normal Jaw: None, normal Tongue: None, normal,Extremity Movements Upper (arms, wrists, hands, fingers): None, normal Lower (legs, knees, ankles, toes): None, normal, Trunk Movements Neck, shoulders, hips: None, normal, Overall Severity Severity of abnormal movements (highest score from questions above): None, normal Incapacitation due to abnormal movements: None, normal Patient's awareness of abnormal movements (rate only patient's report): No Awareness, Dental Status Current problems with teeth and/or dentures?: No Does patient usually wear dentures?: No  CIWA:    COWS:     Musculoskeletal: Strength & Muscle Tone: within normal limits Gait & Station: normal Patient leans: N/A  Psychiatric Specialty Exam:  Presentation  General Appearance: Casual; Neat  Eye Contact:  Good  Speech:Normal Rate  Speech  Volume:Normal  Handedness:Right  Mood and Affect  Mood: Euphoric  Affect:  Congruent  Thought Process  Thought Processes: Rambles on occasion, predominately logical  Descriptions of Associations: Goal directed  Orientation: Alert and oriented times 4  Thought Content:  WDL  History of Schizophrenia/Schizoaffective disorder:No  Duration of Psychotic Symptoms:Less than six months  Hallucinations:  None\  Ideas of Reference:  No delusions during the assessment  Suicidal Thoughts: none Homicidal Thoughts: none  Sensorium  Memory:Immediate Poor; Immediate Fair; Recent Poor; Remote Poor  Judgment:Impaired  Insight:Lacking   Executive Functions  Concentration: Fair  Attention Span:Fair  Recall:Fair  Fund of  Knowledge:Fair  Language:Good   Psychomotor Activity  Psychomotor Activity:  Normal  Assets  Assets:Communication Skills; Desire for Improvement; Resilience; Social Support   Sleep  Sleep: Good   Physical Exam: Physical Exam Vitals and nursing note reviewed.  Constitutional:      Appearance: Normal appearance.  HENT:     Head: Normocephalic and atraumatic.     Nose: Nose normal.  Pulmonary:     Effort: Pulmonary effort is normal. No respiratory distress.  Musculoskeletal:        General: Normal range of motion.     Cervical back: Normal range of motion.  Neurological:     General: No focal deficit present.     Mental Status: He is alert and oriented to person, place, and time.  Psychiatric:        Attention and Perception: Attention and perception normal.        Mood and Affect: Mood is anxious.        Speech: Speech normal.        Behavior: Behavior normal. Behavior is cooperative.        Thought Content: Thought content normal.        Cognition and Memory: Cognition is impaired.        Judgment: Judgment normal.    Review of Systems  Constitutional:       Feels slightly sedated during the day  Respiratory: Negative.    Cardiovascular: Negative.   Gastrointestinal: Negative.   Musculoskeletal: Negative.   Neurological: Negative.   Psychiatric/Behavioral: Positive for substance abuse. Negative for memory loss and suicidal ideas. The patient is nervous/anxious.   All other systems reviewed and are negative.  Blood pressure 137/90, pulse 92, temperature 97.7 F (36.5 C), temperature source Oral, resp. rate 18, height 5\' 8"  (1.727 m), weight 56.7 kg, SpO2 96 %. Body mass index is 19.01 kg/m.   Treatment Plan Summary: Daily contact with patient to assess and evaluate symptoms and progress in treatment and Medication management   Assessment:  Jay Woods is a 20 y.o. 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  He has had 3 prior admissions with the last being in 2020.   Diagnosis: Cannabis abuse with cannabis-induced disorder [facility](HCC)    Pertinent findings today: Jay Woods is a 20 y.o. 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  Patient continues to be extremely labile and tearful, however has less auditory and visual hallucinations.  He does not appear to be responding to internal stimuli today, however continues to be delusional in regards to a "baby mama".  He remains religiously preoccupied and worries about his mother, believing that he needs to be her caregiver and provide for her financially.    Treatment Plan Summary: Daily contact with patient to assess and evaluate symptoms and progress in treatment.   Scheduled medications/labs: -Patient loading dose of Depakote ER 750 mg on 06/11/2020 -Continue Depakote ER 500 mg daily at bedtime for mood stabilization  -Depakote level to be drawn on 06/16/2020 -[id number] Risperdal to 4 mg at bedtime (changed from twice daily dosing due to sedation during the day)  -Continue loratadine 10 mg daily for allergy symptoms    PRN's : -Trazodone 50 mg daily at bedtime  and may repeat 1 time as needed for sleep -Cogentin 1 mg twice daily as needed for EPS reaction, tremors -Agitation protocol in place - Milk of magnesia - Mylanta -Tylenol       Psychosocial:  1. Encouragement to attend group therapies. 2. Encourage patient to learn and implement coping strategies to avoid need for PRN medications. 3. Encouragement for medication compliance. 4. Disposition in progress, appreciate social work assistance. 5. Patient agreeable to family meeting-appreciate social work assistance in obtaining collateral per patient baseline to assist in clarifying diagnosis as well as for discharge planning.   Develop treatment plan to decrease risk of relapse upon discharge and the need for readmission.  Psycho-social education regarding relapse prevention and self care.  Health care follow up as needed for medical problems.  Review, reconcile, and reinstate any pertinent home medications for other health issues where appropriate. Call for consults with hospitalist for any additional specialty patient care services as needed  Nanine Means, NP 06/14/2020, 1:46 PM

## 2020-06-15 NOTE — [facility] Group Notes (Signed)
  [facility] LCSW Group Therapy Note  Date/Time:  06/15/2020 11:15AM-12:00PM  Type of Therapy and Topic:  Group Therapy:  Feelings About Hospitalization  Participation Level:  Active   Description of Group This process group involved patients discussing their feelings related to being hospitalized, as well as the benefits they see to being in the hospital.  These feelings and benefits were itemized.  The group then brainstormed specific ways in which they could seek those same benefits when they discharge and return home.  Therapeutic Goals Patient will identify and describe positive and negative feelings related to hospitalization Patient will verbalize benefits of hospitalization to themselves personally Patients will brainstorm together ways they can obtain similar benefits in the outpatient setting, identify barriers to wellness and possible solutions  Summary of Patient Progress:  The patient expressed his primary feelings about being hospitalized are good because "there are angels everywhere here with good intentions."  He talked about his father being willing to come pick him up and take him straight to the [employer] where his mother is right now.  He said that he feels he has learned in the hospital how to control his emotions, and thinks outside of the hospital setting he can continue to do that by spending time with his parents, thinking more positively, having more faith, spending time around positive people, and getting a job.  He talked about specific coping skills he has learned in the hospital such as deep breathing.  Therapeutic Modalities Cognitive Behavioral Therapy Motivational Interviewing    Ambrose Mantle, LCSW 06/15/2020, 12:02 PM

## 2020-06-15 NOTE — Progress Notes (Addendum)
Pt observed in milieu at intervals during shift for scheduled groups, meals and medications. Presents animated but anxious / fidgety, tangential and preoccupied, noted with inappropriate smiles and whispers on interactions. Talks loudly to self in his room, hyper-religious theme and songs but is pleasant. Noted with intermittent pacing and some irritability when asked "How many cartels were after you" due to his Latin heritage. Pt replied "Please see me see me as your son, not because I'm Latina, you are like my father with your age".  Pt does show some limited insight related to groups and unit routines. Pt showered and changed his clothes this shift. Remains medication compliant without adverse drug reactions. Denies SI, HI, AVH and pain. Reported he slept well last night, appetite is good with normal mood and good concentration level. Rates his depression and hopelessness both 0/10 and anxiety 4/10. All medications given as ordered with verbal education and effects monitored. Q 15 minutes safety checks maintained without issues. Pt remains verbally redirectable and cooperative with care.

## 2020-06-15 NOTE — Progress Notes (Signed)
Pt visible on the unit interacting with peers and staff , pt stated he was feeling better    06/15/20 2200  Psych Admission Type (Psych Patients Only)  Admission Status Involuntary  Psychosocial Assessment  Patient Complaints None  Eye Contact Fair  Facial Expression Animated  Affect Anxious  Speech Rapid;Pressured  Interaction Childlike  Motor Activity Fidgety;Restless  Appearance/Hygiene Unremarkable  Behavior Characteristics Cooperative  Mood Suspicious;Preoccupied  Thought Process  Coherency WDL  Content WDL  Delusions Religious  Perception WDL  Hallucination None reported or observed  Judgment Poor  Confusion None  Danger to Self  Current suicidal ideation? Denies  Danger to Others  Danger to Others None reported or observed

## 2020-06-15 NOTE — Progress Notes (Signed)
[facility] MD Progress Note  06/15/2020 4:13 PM Jay Woods  MRN:  [id number]    Subjective: Jay Woods reports, "I'm alright, can't complain".  Objective: 20 year old Hispanic male, presents bizarre and pleasant; inconsistent historian. States he slept x2 days ago, then states he slept this morning. Endorses poor appetite with recent weight loss; last ate yesterday. States he smokes CBD (store) and marijuana daily; last smoked Saturday (yesterday).  Client seen and evaluated in person on the unit.  Prior to the assessment, he was participating actively in group.  No behavior issues on the unit, interacting appropriately with peers and staff. He reports he feels excited and rambles at times about his mom.  Denies suicidal/homicidal ideations, hallucinations.  "I slept like a baby" and appetite is "good".  Denies side effects from his medications.  No physical pain or discomfort, smiles frequently throughout the assessment.  Appears to be on the hypomanic end of the spectrum or continuing to have effects from vaping Delta 8, synthetic cannabis sold in vape stores.    Principal Problem: Cannabis abuse with cannabis-induced disorder (HCC) Diagnosis: Principal Problem:   Cannabis abuse with cannabis-induced disorder (HCC)  Total Time spent with patient: 15 minutes  Past Psychiatric History: Intermittent explosive disorder, cannabis abuse, and past hospitalizations for cannabis induced psychosis.  He presented to [facility] on 06/11/2020 for depressed mood and bizarre thoughts.  Per collateral obtained at initial assessment he had been fine until 4 days prior to presenting to the [facility].  Past Medical History:  Past Medical History:  Diagnosis Date  . Eczema    History reviewed. No pertinent surgical history. Family History: History reviewed. No pertinent family history.  Family Psychiatric  History: None  Social History:  Social History   Substance and Sexual Activity  Alcohol Use Yes  .  Alcohol/week: 1.0 standard drink  . Types: 1 Cans of beer per week   Comment: twice/week     Social History   Substance and Sexual Activity  Drug Use Yes  . Types: Marijuana    Social History   Socioeconomic History  . Marital status: Single    Spouse name: Not on file  . Number of children: Not on file  . Years of education: Not on file  . Highest education level: Not on file  Occupational History  . Not on file  Tobacco Use  . Smoking status: Former Smoker    Packs/day: 1.00    Years: 5.00    Pack years: 5.00  . Smokeless tobacco: Former Clinical biochemist  . Vaping Use: Some days  . Substances: CBD  Substance and Sexual Activity  . Alcohol use: Yes    Alcohol/week: 1.0 standard drink    Types: 1 Cans of beer per week    Comment: twice/week  . Drug use: Yes    Types: Marijuana  . Sexual activity: Not Currently  Other Topics Concern  . Not on file  Social History Narrative  . Not on file   Social Determinants of Health   Financial Resource Strain: Not on file  Food Insecurity: Not on file  Transportation Needs: Not on file  Physical Activity: Not on file  Stress: Not on file  Social Connections: Not on file   Additional Social History:   Sleep: Fair  Appetite:  Fair  Current Medications: Current Facility-Administered Medications  Medication Dose Route Frequency Provider Last Rate Last Admin  . acetaminophen (TYLENOL) tablet 650 mg  650 mg Oral Q6H PRN Estella Husk,  MD      . alum & mag hydroxide-simeth (MAALOX/MYLANTA) 200-200-20 MG/5ML suspension 30 mL  30 mL Oral Q4H PRN Estella Husk, MD      . benztropine (COGENTIN) tablet 1 mg  1 mg Oral BID PRN Mariel Craft, MD      . divalproex (DEPAKOTE ER) 24 hr tablet 500 mg  500 mg Oral QHS Mariel Craft, MD   500 mg at 06/14/20 2120  . loratadine (CLARITIN) tablet 10 mg  10 mg Oral Daily Mariel Craft, MD   10 mg at 06/15/20 0727  . magnesium hydroxide (MILK OF MAGNESIA) suspension  30 mL  30 mL Oral Daily PRN Estella Husk, MD      . nicotine polacrilex (NICORETTE) gum 2 mg  2 mg Oral PRN White, Patrice L, NP   2 mg at 06/12/20 2114  . OLANZapine zydis (ZYPREXA) disintegrating tablet 10 mg  10 mg Oral Q8H PRN Estella Husk, MD   10 mg at 06/13/20 1719   And  . ziprasidone (GEODON) injection 20 mg  20 mg Intramuscular PRN Estella Husk, MD      . risperiDONE (RISPERDAL) tablet 4 mg  4 mg Oral QHS Mariel Craft, MD   4 mg at 06/14/20 2120  . traZODone (DESYREL) tablet 50 mg  50 mg Oral QHS,MR X 1 White, Patrice L, NP   50 mg at 06/14/20 2121   Lab Results: No results found for this or any previous visit (from the past 48 hour(s)).  Blood Alcohol level:  Lab Results  Component Value Date   ETH <10 06/09/2020   ETH <10 12/06/2018   Metabolic Disorder Labs: Lab Results  Component Value Date   HGBA1C 5.6 06/09/2020   MPG 114 06/09/2020   MPG 99.67 02/28/2017   Lab Results  Component Value Date   PROLACTIN 4.4 06/09/2020   Lab Results  Component Value Date   CHOL 142 06/09/2020   TRIG 51 06/09/2020   HDL 51 06/09/2020   CHOLHDL 2.8 06/09/2020   VLDL 10 06/09/2020   LDLCALC 81 06/09/2020   LDLCALC 98 02/28/2017    Physical Findings: AIMS: Facial and Oral Movements Muscles of Facial Expression: None, normal Lips and Perioral Area: None, normal Jaw: None, normal Tongue: None, normal,Extremity Movements Upper (arms, wrists, hands, fingers): None, normal Lower (legs, knees, ankles, toes): None, normal, Trunk Movements Neck, shoulders, hips: None, normal, Overall Severity Severity of abnormal movements (highest score from questions above): None, normal Incapacitation due to abnormal movements: None, normal Patient's awareness of abnormal movements (rate only patient's report): No Awareness, Dental Status Current problems with teeth and/or dentures?: No Does patient usually wear dentures?: No  CIWA:    COWS:      Musculoskeletal: Strength & Muscle Tone: within normal limits Gait & Station: normal Patient leans: N/A  Psychiatric Specialty Exam:  Presentation  General Appearance: Casual; Neat  Eye Contact:  Good  Speech:Normal Rate  Speech Volume:Normal  Handedness:Right  Mood and Affect  Mood: Euphoric  Affect:  Congruent  Thought Process  Thought Processes: Rambles on occasion, predominately logical  Descriptions of Associations: Goal directed  Orientation: Alert and oriented times 4  Thought Content:  WDL  History of Schizophrenia/Schizoaffective disorder:No  Duration of Psychotic Symptoms:Less than six months  Hallucinations:  None  Ideas of Reference:  No delusions during the assessment  Suicidal Thoughts: none Homicidal Thoughts: none  Sensorium  Memory:Immediate Poor; Immediate Fair; Recent Poor; Remote Poor  Judgment:Impaired  Insight:Lacking   Executive Functions  Concentration: Fair  Attention Span:Fair  Recall:Fair  [employer] of Knowledge:Fair  Language:Good   Psychomotor Activity  Psychomotor Activity:  Normal  Assets  Assets:Communication Skills; Desire for Improvement; Resilience; Social Support  Sleep  Sleep: Good  Physical Exam: Physical Exam Vitals and nursing note reviewed.  Constitutional:      Appearance: Normal appearance.  HENT:     Head: Normocephalic and atraumatic.     Nose: Nose normal.     Mouth/Throat:     Pharynx: Oropharynx is clear.  Eyes:     Pupils: Pupils are equal, round, and reactive to light.  Pulmonary:     Effort: Pulmonary effort is normal. No respiratory distress.  Genitourinary:    Comments: Deferred Musculoskeletal:        General: Normal range of motion.     Cervical back: Normal range of motion.  Neurological:     General: No focal deficit present.     Mental Status: He is alert and oriented to person, place, and time.  Psychiatric:        Attention and Perception: Attention and perception  normal.        Mood and Affect: Mood is anxious.        Speech: Speech normal.        Behavior: Behavior normal. Behavior is cooperative.        Thought Content: Thought content normal.        Cognition and Memory: Cognition is impaired.        Judgment: Judgment normal.    Review of Systems  Constitutional: Negative.        Feels slightly sedated during the day  HENT: Negative.   Eyes: Negative.   Respiratory: Negative.   Cardiovascular: Negative.   Gastrointestinal: Negative.   Genitourinary: Negative.   Musculoskeletal: Negative.   Skin: Negative.   Neurological: Negative.   Psychiatric/Behavioral: Positive for substance abuse. Negative for hallucinations, memory loss and suicidal ideas. The patient is nervous/anxious. The patient does not have insomnia.   All other systems reviewed and are negative.  Blood pressure (!) 146/79, pulse (!) 107, temperature 98.2 F (36.8 C), temperature source Oral, resp. rate 18, height 5\' 8"  (1.727 m), weight 56.7 kg, SpO2 100 %. Body mass index is 19.01 kg/m.   Treatment Plan Summary: Daily contact with patient to assess and evaluate symptoms and progress in treatment and Medication management   Assessment:  Jay MerlesJairo Artica-Limones is a 20 y.o. 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  He has had 3 prior admissions with the last being in 2020.   Diagnosis: Cannabis abuse with cannabis-induced disorder [facility](HCC)    Pertinent findings today: Jay MerlesJairo Artica-Limones is a [age] y.o. 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  Patient continues to be extremely labile and tearful, however has less auditory and visual hallucinations.  He does not appear to be responding to internal stimuli today, however continues to be delusional in regards to a "baby mama".  He remains religiously preoccupied and worries about his mother, believing that he needs to be her caregiver and provide for  her financially.  Treatment Plan Summary: Daily contact with patient to assess and evaluate symptoms and progress in treatment.   Scheduled medications/labs: -Patient loading dose of Depakote ER 750 mg on 06/11/2020 -Continue Depakote ER 500 mg daily at bedtime for mood stabilization -Depakote level to be drawn on 06/16/2020 -Continue Risperdal  4 mg at bedtime (changed from twice daily dosing due to sedation during the day)  -Continue loratadine 10 mg daily for allergy symptoms  PRN's : -Trazodone 50 mg daily at bedtime and may repeat 1 time as needed for sleep -Cogentin 1 mg twice daily as needed for EPS reaction, tremors -Agitation protocol in place - Milk of magnesia - Mylanta -Tylenol   Psychosocial:   1. Encouragement to attend group therapies. 2. Encourage patient to learn and implement coping strategies to avoid need for PRN medications. 3. Encouragement for medication compliance. 4. Disposition in progress, appreciate social work assistance. 5. Patient agreeable to family meeting-appreciate social work assistance in obtaining collateral per patient baseline to assist in clarifying diagnosis as well as for discharge planning.   Develop treatment plan to decrease risk of relapse upon discharge and the need for readmission.  Psycho-social education regarding relapse prevention and self care.  Health care follow up as needed for medical problems.  Review, reconcile, and reinstate any pertinent home medications for other health issues where appropriate. Call for consults with hospitalist for any additional specialty patient care services as needed  Armandina Stammer, NP, pmhnp, fnp-bc 06/15/2020, 4:13 PMPatient ID: Jay Woods, male   DOB: [birth year], 20 y.o.   MRN: [id number]

## 2020-06-16 MED ORDER — RISPERIDONE 1 MG PO TBDP
1.0000 mg | ORAL_TABLET | Freq: Every day | ORAL | Status: DC
  Administered 2020-06-16 – 2020-06-18 (×3): 1 mg via ORAL
  Filled 2020-06-16 (×5): qty 1

## 2020-06-16 NOTE — Progress Notes (Signed)
Pt visible in dayroom for scheduled groups and was actively engaged in activities. Observed to animated with bright affect, concrete,  forwards on interactions but is fidgety and restless. Continues to be hyper religious quoting and writing Bible verses for peers was verbally redirectable then. Compliant with medications when offered. Rates his depression 0/10, anxiety 1/10 "I miss my mom" and hopelessness all 0/10. Preoccupied about  "getting the right medications, y'all will not let the doctor give me any bad medications right. I see y'all are good people here. Please help me" as Clinical research associate was doing his EKG. Remains medication compliant, tolerates all medications and PO intake well without discomfort. Reports he slept well last night with good appetite, hyper energy and poor concentration level. Pt's goal for today is "To pray more, harder and hopes God bless everyone here 100%". Verbal education done by writer on changes made to medication regimen and pt verbalized understanding. Emotional support and encouragement provided to pt. Q 15 minutes safety checks maintained without incident thus far.

## 2020-06-16 NOTE — Progress Notes (Signed)
[facility] MD Progress Note  06/16/2020 12:53 PM Jay Woods  MRN:  [id number] Subjective: Patient is a 20 year old male with a reported past psychiatric history significant for substance-induced psychotic disorder who was admitted on 06/09/2020 secondary to psychotic symptoms, grandiosity and paranoia.  Objective: Patient is seen and examined.  Patient is a 20 year old male with the above-stated past psychiatric history who is seen in follow-up.  Review of the electronic medical record revealed appropriate behavior today.  He slept 6.75 hours.  On interview today he continues to voice paranoid thinking.  He talks about how his brother and other people living in the home were not "doing what they were supposed to do".  He denied any auditory or visual hallucinations, but clearly remains paranoid.  His vital signs are stable, he is afebrile.  Review of his admission laboratories revealed elevated liver function enzymes with an AST of 45 and ALT of 50.  These are slightly more elevated than 8 months ago.  CBC was normal.  His valproic acid level on admission was less than 10.  Hemoglobin A1c was 5.6.  TSH was normal at 0.319.  Respiratory panel was negative.  Drug screen was negative except for marijuana.  Blood alcohol on admission was less than 10.  EKG was of poor quality, but did show a sinus rhythm with a normal QTc interval. He denied auditory or visual hallucinations.  He denied suicidal or homicidal ideation.  Principal Problem: Cannabis abuse with cannabis-induced disorder (HCC) Diagnosis: Principal Problem:   Cannabis abuse with cannabis-induced disorder (HCC)  Total Time spent with patient: 20 minutes  Past Psychiatric History: See admission H&P  Past Medical History:  Past Medical History:  Diagnosis Date  . Eczema    History reviewed. No pertinent surgical history. Family History: History reviewed. No pertinent family history. Family Psychiatric  History: See admission H&P Social  History:  Social History   Substance and Sexual Activity  Alcohol Use Yes  . Alcohol/week: 1.0 standard drink  . Types: 1 Cans of beer per week   Comment: twice/week     Social History   Substance and Sexual Activity  Drug Use Yes  . Types: Marijuana    Social History   Socioeconomic History  . Marital status: Single    Spouse name: Not on file  . Number of children: Not on file  . Years of education: Not on file  . Highest education level: Not on file  Occupational History  . Not on file  Tobacco Use  . Smoking status: Former Smoker    Packs/day: 1.00    Years: 5.00    Pack years: 5.00  . Smokeless tobacco: Former Clinical biochemist  . Vaping Use: Some days  . Substances: CBD  Substance and Sexual Activity  . Alcohol use: Yes    Alcohol/week: 1.0 standard drink    Types: 1 Cans of beer per week    Comment: twice/week  . Drug use: Yes    Types: Marijuana  . Sexual activity: Not Currently  Other Topics Concern  . Not on file  Social History Narrative  . Not on file   Social Determinants of Health   Financial Resource Strain: Not on file  Food Insecurity: Not on file  Transportation Needs: Not on file  Physical Activity: Not on file  Stress: Not on file  Social Connections: Not on file   Additional Social History:  Sleep: Good  Appetite:  Good  Current Medications: Current Facility-Administered Medications  Medication Dose Route Frequency Provider Last Rate Last Admin  . acetaminophen (TYLENOL) tablet 650 mg  650 mg Oral Q6H PRN Estella Husk, MD      . alum & mag hydroxide-simeth (MAALOX/MYLANTA) 200-200-20 MG/5ML suspension 30 mL  30 mL Oral Q4H PRN Estella Husk, MD      . benztropine (COGENTIN) tablet 1 mg  1 mg Oral BID PRN Mariel Craft, MD      . divalproex (DEPAKOTE ER) 24 hr tablet 500 mg  500 mg Oral QHS Mariel Craft, MD   500 mg at 06/15/20 2053  . loratadine (CLARITIN) tablet 10 mg  10  mg Oral Daily Mariel Craft, MD   10 mg at 06/16/20 0735  . magnesium hydroxide (MILK OF MAGNESIA) suspension 30 mL  30 mL Oral Daily PRN Estella Husk, MD      . nicotine polacrilex (NICORETTE) gum 2 mg  2 mg Oral PRN White, Patrice L, NP   2 mg at 06/12/20 2114  . OLANZapine zydis (ZYPREXA) disintegrating tablet 10 mg  10 mg Oral Q8H PRN Estella Husk, MD   10 mg at 06/13/20 1719   And  . ziprasidone (GEODON) injection 20 mg  20 mg Intramuscular PRN Estella Husk, MD      . risperiDONE (RISPERDAL) tablet 4 mg  4 mg Oral QHS Mariel Craft, MD   4 mg at 06/15/20 2052  . traZODone (DESYREL) tablet 50 mg  50 mg Oral QHS,MR X 1 White, Patrice L, NP   50 mg at 06/15/20 2052    Lab Results: No results found for this or any previous visit (from the past 48 hour(s)).  Blood Alcohol level:  Lab Results  Component Value Date   ETH <10 06/09/2020   ETH <10 12/06/2018    Metabolic Disorder Labs: Lab Results  Component Value Date   HGBA1C 5.6 06/09/2020   MPG 114 06/09/2020   MPG 99.67 02/28/2017   Lab Results  Component Value Date   PROLACTIN 4.4 06/09/2020   Lab Results  Component Value Date   CHOL 142 06/09/2020   TRIG 51 06/09/2020   HDL 51 06/09/2020   CHOLHDL 2.8 06/09/2020   VLDL 10 06/09/2020   LDLCALC 81 06/09/2020   LDLCALC 98 02/28/2017    Physical Findings: AIMS: Facial and Oral Movements Muscles of Facial Expression: None, normal Lips and Perioral Area: None, normal Jaw: None, normal Tongue: None, normal,Extremity Movements Upper (arms, wrists, hands, fingers): None, normal Lower (legs, knees, ankles, toes): None, normal, Trunk Movements Neck, shoulders, hips: None, normal, Overall Severity Severity of abnormal movements (highest score from questions above): None, normal Incapacitation due to abnormal movements: None, normal Patient's awareness of abnormal movements (rate only patient's report): No Awareness, Dental Status Current  problems with teeth and/or dentures?: No Does patient usually wear dentures?: No  CIWA:    COWS:     Musculoskeletal: Strength & Muscle Tone: within normal limits Gait & Station: normal Patient leans: N/A  Psychiatric Specialty Exam:  Presentation  General Appearance: Casual  Eye Contact:Fair  Speech:Clear and Coherent; Normal Rate  Speech Volume:Normal  Handedness:Right   Mood and Affect  Mood:Anxious  Affect:Appropriate   Thought Process  Thought Processes:Goal Directed  Descriptions of Associations:Loose  Orientation:Full (Time, Place and Person)  Thought Content:Delusions; Rumination  History of Schizophrenia/Schizoaffective disorder:No  Duration of Psychotic Symptoms:Greater than six months  Hallucinations:Hallucinations:  Auditory  Ideas of Reference:Delusions; Paranoia  Suicidal Thoughts:Suicidal Thoughts: No  Homicidal Thoughts:Homicidal Thoughts: No   Sensorium  Memory:Immediate Fair; Recent Fair; Remote Fair  Judgment:Fair  Insight:Lacking   Executive Functions  Concentration:Fair  Attention Span:Fair  Recall:Fair  Fund of Knowledge:Fair  Language:Fair   Psychomotor Activity  Psychomotor Activity:Psychomotor Activity: Normal   Assets  Assets:Desire for Improvement; Resilience   Sleep  Sleep:Sleep: Good Number of Hours of Sleep: 6.75    Physical Exam: Physical Exam Vitals and nursing note reviewed.  Constitutional:      Appearance: Normal appearance.  HENT:     Head: Normocephalic and atraumatic.  Pulmonary:     Effort: Pulmonary effort is normal.  Neurological:     General: No focal deficit present.     Mental Status: He is alert and oriented to person, place, and time.    ROS Blood pressure 134/67, pulse 80, temperature 98.2 F (36.8 C), temperature source Oral, resp. rate 18, height 5\' 8"  (1.727 m), weight 56.7 kg, SpO2 99 %. Body mass index is 19.01 kg/m.   Treatment Plan Summary: Daily contact with  patient to assess and evaluate symptoms and progress in treatment, Medication management and Plan : Patient is seen and examined.  Patient is a 20 year old male with the above-stated past psychiatric history who is seen in follow-up.   Diagnosis: 1.  Substance-induced psychotic disorder 2.  Cannabis dependence 3.  Abnormal liver function enzymes  Pertinent findings on examination today: 1.  Patient denied auditory or visual hallucinations. 2.  Patient denied suicidal or homicidal ideation. 3.  Paranoid delusions apparently are still fixed.  Plan: 1.  Continue acetaminophen 650 mg p.o. every 6 hours as needed mild pain. 2.  Continue Cogentin 1 mg p.o. twice daily as needed tremor. 3.  Continue Depakote ER 500 mg p.o. nightly for mood stability. 4.  Continue Claritin 10 mg p.o. daily for seasonal allergies. 5.  Continue Zyprexa Zydis as needed agitation protocol. 6.  Increase Risperdal to 1 mg p.o. daily and 4 mg p.o. nightly for psychosis. 7.  Depakote level ordered for 06/16/2020, but apparently was not done.  I will reorder that as well as a CBC with differential and liver function enzymes. 8.  Repeat EKG given poor quality baseline. 9.  Disposition planning-hopefully ready for discharge in 2 to 3 days.  06/18/2020, MD 06/16/2020, 12:53 PM

## 2020-06-16 NOTE — Progress Notes (Signed)
   06/16/20 0500  Sleep  Number of Hours 6.75

## 2020-06-16 NOTE — [facility] Group Notes (Signed)
[facility] Group Notes:  (Nursing/MHT/Case Management/Adjunct)  Date:  06/16/2020  Time:  9:24 AM  Type of Therapy:  Group Therapy  Participation Level:  Active  Participation Quality:  Appropriate  Affect:  Appropriate  Cognitive:  Alert and Appropriate  Insight:  Appropriate and Good  Engagement in Group:  Engaged  Modes of Intervention:  Orientation  Summary of Progress/Problems: His goal for today is to be more patient.   Chenee Munns J Tierany Appleby 06/16/2020, 9:24 AM

## 2020-06-16 NOTE — [facility] Group Notes (Signed)
[facility] LCSW Group Therapy Note  Date/Time:  06/16/2020  11:00AM-12:00PM  Type of Therapy and Topic:  Group Therapy:  Music and Mood  Participation Level:  Active   Description of Group: In this process group, members listened to a variety of genres of music and identified that different types of music evoke different responses.  Patients were encouraged to identify music that was soothing for them and music that was energizing for them.  Patients discussed how this knowledge can help with wellness and recovery in various ways including managing depression and anxiety as well as encouraging healthy sleep habits.    Therapeutic Goals: 1. Patients will explore the impact of different varieties of music on mood 2. Patients will verbalize the thoughts they have when listening to different types of music 3. Patients will identify music that is soothing to them as well as music that is energizing to them 4. Patients will discuss how to use this knowledge to assist in maintaining wellness and recovery 5. Patients will explore the use of music as a coping skill  Summary of Patient Progress:  At the beginning of group, patient expressed that he felt good and happy.  At the end of group, patient expressed that he still felt good and happy.    Therapeutic Modalities: Solution Focused Brief Therapy Activity   Ambrose Mantle, LCSW

## 2020-06-16 NOTE — Progress Notes (Signed)
   06/16/20 2200  Psych Admission Type (Psych Patients Only)  Admission Status Involuntary  Psychosocial Assessment  Patient Complaints None  Eye Contact Fair  Facial Expression Animated  Affect Anxious  Speech Rapid;Pressured  Interaction Childlike  Motor Activity Fidgety;Restless  Appearance/Hygiene Unremarkable  Behavior Characteristics Cooperative  Mood Pleasant  Thought Process  Coherency WDL  Content WDL  Delusions Religious  Perception WDL  Hallucination None reported or observed  Judgment Poor  Confusion None  Danger to Self  Current suicidal ideation? Denies  Danger to Others  Danger to Others None reported or observed  D: Patient in dayroom interacting well with peers. Pt reports he had a good day and denies any medication adverse effects.  A: Medications administered as prescribed. Support and encouragement provided as needed.  R: Patient remains safe on the unit. Will continue to monitor for safety and stability.

## 2020-06-17 LAB — CBC WITH DIFFERENTIAL/PLATELET
Abs Immature Granulocytes: 0.02 10*3/uL (ref 0.00–0.07)
Basophils Absolute: 0.1 10*3/uL (ref 0.0–0.1)
Basophils Relative: 1 %
Eosinophils Absolute: 0.1 10*3/uL (ref 0.0–0.5)
Eosinophils Relative: 2 %
HCT: 44.8 % (ref 39.0–52.0)
Hemoglobin: 14.9 g/dL (ref 13.0–17.0)
Immature Granulocytes: 0 %
Lymphocytes Relative: 33 %
Lymphs Abs: 2.4 10*3/uL (ref 0.7–4.0)
MCH: 31.4 pg (ref 26.0–34.0)
MCHC: 33.3 g/dL (ref 30.0–36.0)
MCV: 94.5 fL (ref 80.0–100.0)
Monocytes Absolute: 0.7 10*3/uL (ref 0.1–1.0)
Monocytes Relative: 9 %
Neutro Abs: 4 10*3/uL (ref 1.7–7.7)
Neutrophils Relative %: 55 %
Platelets: 287 10*3/uL (ref 150–400)
RBC: 4.74 MIL/uL (ref 4.22–5.81)
RDW: 13.4 % (ref 11.5–15.5)
WBC: 7.3 10*3/uL (ref 4.0–10.5)
nRBC: 0 % (ref 0.0–0.2)

## 2020-06-17 LAB — HEPATIC FUNCTION PANEL
ALT: 27 U/L (ref 0–44)
AST: 17 U/L (ref 15–41)
Albumin: 4.4 g/dL (ref 3.5–5.0)
Alkaline Phosphatase: 43 U/L (ref 38–126)
Bilirubin, Direct: <0.1 mg/dL (ref 0.0–0.2)
Total Bilirubin: 0.5 mg/dL (ref 0.3–1.2)
Total Protein: 7.6 g/dL (ref 6.5–8.1)

## 2020-06-17 LAB — VALPROIC ACID LEVEL: Valproic Acid Lvl: 25 ug/mL — ABNORMAL LOW (ref 50.0–100.0)

## 2020-06-17 MED ORDER — DIVALPROEX SODIUM ER 250 MG PO TB24
750.0000 mg | ORAL_TABLET | Freq: Every day | ORAL | Status: DC
  Administered 2020-06-17: 750 mg via ORAL
  Filled 2020-06-17 (×3): qty 3

## 2020-06-17 NOTE — Progress Notes (Signed)
Recreation Therapy Notes  Date: 4.25.22 Time: 1000 Location: 500 Hall Dayroom  Group Topic: Coping Skills  Goal Area(s) Addresses:  Patient will identify positive coping strategies. Patient will identify benefits of positive coping strategies. Patient will identify negative coping strategies. Patient will identify setbacks of using negative coping strategies.  Behavioral Response: Engaged  Intervention: Worksheet, Pencils  Activity: Healthy vs. Unhealthy Coping Strategies.  Patients were to identify a problem they are currently facing.  Patients were to identify unhealthy coping strategies they have used and consequences of it.  Patients would then identify healthy coping strategies, expected outcomes and barriers to using these healthy coping strategies.  Education: Pharmacologist, Building control surveyor.   Education Outcome: Acknowledges understanding/In group clarification offered/Needs additional education.   Clinical Observations/Feedback: Pt identified a current problem as eating too much candy and losing himself by not focusing on himself and putting others first.  Pt expressed some unhealthy coping strategies were negative music and not eating healthy.  Pt identified consequences as bad actions/thoughts and loss of appetite because it's good to eat unhealthy for breakfast, lunch and dinner.  Pt identified healthy coping strategies as exercise, good tv and positive music.  Expected outcomes for the coping skills are stronger/healthy/more energy and relaxing brain and thoughts.  Barriers to these coping strategies are no negative thoughts, bad things to eat and weak mind from negative music.    Caroll Rancher, LRT/CTRS     Caroll Rancher A 06/17/2020 10:52 AM

## 2020-06-17 NOTE — Progress Notes (Signed)
Patient ID: Jay Woods, male   DOB: 02-04-2001, 20 y.o.   MRN: [id number] Pt had a am lab draw. Pt reported feeling light headed and dizzy. Pt helped to a chair and fluid given. VS stable. Pt escorted to dinning hall and back to his room. Pt reports decrease in symptom but instructed to rise slowly and call for help as needed. Pt is safe, will continue to monitor and reassess.

## 2020-06-17 NOTE — Progress Notes (Signed)
[facility]  06/17/2020 11:43 AM Jay Woods  MRN:  [id number] Subjective:  Patient is a 20 year old male with a reported past psychiatric history significant for substance-induced psychotic disorder who was admitted on 06/09/2020 secondary to psychotic symptoms, grandiosity and paranoia.  Objective: Patient is seen and examined.  Patient is a 20 year old male with the above-stated past psychiatric history who is seen in follow-up.  He stated he feels better today.  He denied any auditory or visual hallucinations.  We discussed the fact that he is going to go to the [employer] and stay with his father.  He talks about [place] [facility] [place] an [place] bracelet.  He denied that he is on parole or anything.  He stated this is "just to monitor me".  It is unclear whether or not this is based on reality or paranoia.  He still a little bit labile.  He talks about how the "angels on the unit have help me".  He also becomes tearful when talking about this.  He denied any suicidal or homicidal ideation.  He denied any side effects to his current medications.  His vital signs are stable, he is afebrile.  Pulse oximetry on room air was 100%.  He slept 8 hours last night.  Laboratories from 4/25 his liver function enzymes are normal.  CBC is normal.  Differential is normal.  Valproic acid level was only 25.  Principal Problem: Cannabis abuse with cannabis-induced disorder (HCC) Diagnosis: Principal Problem:   Cannabis abuse with cannabis-induced disorder (HCC)  Total Time spent with patient: 20 minutes  Past Psychiatric History: See admission H&P  Past Medical History:  Past Medical History:  Diagnosis Date  . Eczema    History reviewed. No pertinent surgical history. Family History: History reviewed. No pertinent family history. Family Psychiatric  History: See admission H&P Social History:  Social History   Substance and Sexual Activity  Alcohol  Use Yes  . Alcohol/week: 1.0 standard drink  . Types: 1 Cans of beer per week   Comment: twice/week     Social History   Substance and Sexual Activity  Drug Use Yes  . Types: Marijuana    Social History   Socioeconomic History  . Marital status: Single    Spouse name: Not on file  . Number of children: Not on file  . Years of education: Not on file  . Highest education level: Not on file  Occupational History  . Not on file  Tobacco Use  . Smoking status: Former Smoker    Packs/day: 1.00    Years: 5.00    Pack years: 5.00  . Smokeless tobacco: Former Clinical biochemistUser  Vaping Use  . Vaping Use: Some days  . Substances: CBD  Substance and Sexual Activity  . Alcohol use: Yes    Alcohol/week: 1.0 standard drink    Types: 1 Cans of beer per week    Comment: twice/week  . Drug use: Yes    Types: Marijuana  . Sexual activity: Not Currently  Other Topics Concern  . Not on file  Social History Narrative  . Not on file   Social Determinants of Health   Financial Resource Strain: Not on file  Food Insecurity: Not on file  Transportation Needs: Not on file  Physical Activity: Not on file  Stress: Not on file  Social Connections: Not on file   Additional Social History:  Sleep: Good  Appetite:  Good  Current Medications: Current Facility-Administered Medications  Medication Dose Route Frequency Provider Last Rate Last Admin  . acetaminophen (TYLENOL) tablet 650 mg  650 mg Oral Q6H PRN Estella Husk, MD      . alum & mag hydroxide-simeth (MAALOX/MYLANTA) 200-200-20 MG/5ML suspension 30 mL  30 mL Oral Q4H PRN Estella Husk, MD      . benztropine (COGENTIN) tablet 1 mg  1 mg Oral BID PRN Mariel Craft, MD      . divalproex (DEPAKOTE ER) 24 hr tablet 500 mg  500 mg Oral QHS Mariel Craft, MD   500 mg at 06/16/20 2045  . loratadine (CLARITIN) tablet 10 mg  10 mg Oral Daily Mariel Craft, MD   10 mg at 06/17/20 0819  .  magnesium hydroxide (MILK OF MAGNESIA) suspension 30 mL  30 mL Oral Daily PRN Estella Husk, MD      . nicotine polacrilex (NICORETTE) gum 2 mg  2 mg Oral PRN White, Patrice L, NP   2 mg at 06/12/20 2114  . OLANZapine zydis (ZYPREXA) disintegrating tablet 10 mg  10 mg Oral Q8H PRN Estella Husk, MD   10 mg at 06/13/20 1719   And  . ziprasidone (GEODON) injection 20 mg  20 mg Intramuscular PRN Estella Husk, MD      . risperiDONE (RISPERDAL M-TABS) disintegrating tablet 1 mg  1 mg Oral Daily Antonieta Pert, MD   1 mg at 06/17/20 0819  . risperiDONE (RISPERDAL) tablet 4 mg  4 mg Oral QHS Mariel Craft, MD   4 mg at 06/16/20 2045  . traZODone (DESYREL) tablet 50 mg  50 mg Oral QHS,MR X 1 White, Patrice L, NP   50 mg at 06/16/20 2045    Lab Results:  Results for orders placed or performed during the hospital encounter of 06/10/20 (from the past 48 hour(s))  Valproic acid level     Status: Abnormal   Collection Time: 06/17/20  6:37 AM  Result Value Ref Range   Valproic Acid Lvl 25 (L) 50.0 - 100.0 ug/mL    Comment: Performed at [facility], [address] [place], [place] [address]  CBC with Differential/Platelet     Status: None   Collection Time: 06/17/20  6:37 AM  Result Value Ref Range   WBC 7.3 4.0 - 10.5 K/uL   RBC 4.74 4.22 - 5.81 MIL/uL   Hemoglobin 14.9 13.0 - 17.0 g/dL   HCT 67.6 19.5 - 09.3 %   MCV 94.5 80.0 - 100.0 fL   MCH 31.4 26.0 - 34.0 pg   MCHC 33.3 30.0 - 36.0 g/dL   RDW 26.7 12.4 - 58.0 %   Platelets 287 150 - 400 K/uL   nRBC 0.0 0.0 - 0.2 %   Neutrophils Relative % 55 %   Neutro Abs 4.0 1.7 - 7.7 K/uL   Lymphocytes Relative 33 %   Lymphs Abs 2.4 0.7 - 4.0 K/uL   Monocytes Relative 9 %   Monocytes Absolute 0.7 0.1 - 1.0 K/uL   Eosinophils Relative 2 %   Eosinophils Absolute 0.1 0.0 - 0.5 K/uL   Basophils Relative 1 %   Basophils Absolute 0.1 0.0 - 0.1 K/uL   Immature Granulocytes 0 %   Abs Immature Granulocytes  0.02 0.00 - 0.07 K/uL    Comment: Performed at [facility], [address], [place], [place] [address]  Hepatic function  panel     Status: None   Collection Time: 06/17/20  6:37 AM  Result Value Ref Range   Total Protein 7.6 6.5 - 8.1 g/dL   Albumin 4.4 3.5 - 5.0 g/dL   AST 17 15 - 41 U/L   ALT 27 0 - 44 U/L   Alkaline Phosphatase 43 38 - 126 U/L   Total Bilirubin 0.5 0.3 - 1.2 mg/dL   Bilirubin, Direct <2.9 0.0 - 0.2 mg/dL   Indirect Bilirubin NOT CALCULATED 0.3 - 0.9 mg/dL    Comment: Performed at [facility], [address], [place], [place] [address]    Blood Alcohol level:  Lab Results  Component Value Date   ETH <10 06/09/2020   ETH <10 12/06/2018    Metabolic Disorder Labs: Lab Results  Component Value Date   HGBA1C 5.6 06/09/2020   MPG 114 06/09/2020   MPG 99.67 02/28/2017   Lab Results  Component Value Date   PROLACTIN 4.4 06/09/2020   Lab Results  Component Value Date   CHOL 142 06/09/2020   TRIG 51 06/09/2020   HDL 51 06/09/2020   CHOLHDL 2.8 06/09/2020   VLDL 10 06/09/2020   LDLCALC 81 06/09/2020   LDLCALC 98 02/28/2017    Physical Findings: AIMS: Facial and Oral Movements Muscles of Facial Expression: None, normal Lips and Perioral Area: None, normal Jaw: None, normal Tongue: None, normal,Extremity Movements Upper (arms, wrists, hands, fingers): None, normal Lower (legs, knees, ankles, toes): None, normal, Trunk Movements Neck, shoulders, hips: None, normal, Overall Severity Severity of abnormal movements (highest score from questions above): None, normal Incapacitation due to abnormal movements: None, normal Patient's awareness of abnormal movements (rate only patient's report): No Awareness, Dental Status Current problems with teeth and/or dentures?: No Does patient usually wear dentures?: No  CIWA:    COWS:     Musculoskeletal: Strength & Muscle Tone: within normal limits Gait & Station:  normal Patient leans: N/A  Psychiatric Specialty Exam:  Presentation  General Appearance: Casual  Eye Contact:Fair  Speech:Clear and Coherent; Normal Rate  Speech Volume:Normal  Handedness:Right   Mood and Affect  Mood:Anxious  Affect:Congruent   Thought Process  Thought Processes:Coherent  Descriptions of Associations:Intact  Orientation:Full (Time, Place and Person)  Thought Content:Delusions; Paranoid Ideation  History of Schizophrenia/Schizoaffective disorder:No  Duration of Psychotic Symptoms:Less than six months  Hallucinations:Hallucinations: None  Ideas of Reference:Delusions; Paranoia  Suicidal Thoughts:Suicidal Thoughts: No  Homicidal Thoughts:Homicidal Thoughts: No   Sensorium  Memory:Immediate Fair; Recent Fair; Remote Fair  Judgment:Fair  Insight:Fair   Executive Functions  Concentration:Fair  Attention Span:Fair  Recall:Fair  Fund of Knowledge:Fair  Language:Fair   Psychomotor Activity  Psychomotor Activity:Psychomotor Activity: Normal   Assets  Assets:Desire for Improvement; Resilience; Social Support; Housing   Sleep  Sleep:Sleep: Good Number of Hours of Sleep: 8    Physical Exam: Physical Exam Vitals and nursing note reviewed.  Constitutional:      Appearance: Normal appearance.  HENT:     Head: Normocephalic and atraumatic.  Pulmonary:     Effort: Pulmonary effort is normal.  Neurological:     General: No focal deficit present.     Mental Status: He is alert and oriented to person, place, and time.    ROS Blood pressure 126/74, pulse 80, temperature 98.4 F (36.9 C), temperature source Oral, resp. rate 18, height 5\' 8"  (1.727 m), weight 56.7 kg, SpO2 100 %. Body mass index is 19.01 kg/m.   Treatment Plan Summary: Daily contact with patient  to assess and evaluate symptoms and progress in treatment, Medication management and Plan : Patient is seen and examined.  Patient is a 20 year old male with the  above-stated past psychiatric history who is seen in follow-up.   Diagnosis: 1.  Substance-induced psychotic disorder 2.  Cannabis dependence 3.  Abnormal liver function enzymes; resolved  Pertinent findings on examination today: 1.  Patient denies auditory or visual hallucinations. 2.  Patient denied suicidal or homicidal ideation. 3.  Paranoid delusions still appear to be quite fixed. 4.  Patient remains mildly labile. 5.  CBC with differential as well as liver function enzymes and Depakote level are all within normal limits.  Plan: 1.  Continue acetaminophen 650 mg p.o. every 6 hours as needed pain. 2.  Continue Cogentin 1 mg p.o. twice daily secondary to as needed tremor. 3.  Increase Depakote ER to 750 mg p.o. nightly for mood stability. 4.  Continue Claritin 10 mg p.o. daily for seasonal allergies. 5.  Continue Zyprexa Zydis as needed agitation protocol. 6.  Continue Risperdal 1 mg p.o. daily and 4 mg p.o. nightly for psychosis. 7.  Disposition planning-we need clarification with regard to what ever this electronic device and ankle brace thing is whether or not it truly exists.  We also need to contact his mother with regard to whether or not he is truly going to go stay with the father on the [employer].    Antonieta Pert, MD 06/17/2020, 11:43 AM

## 2020-06-17 NOTE — Tx Team (Signed)
Interdisciplinary Treatment and Diagnostic Plan Update  06/17/2020 Time of Session: 9:40am Jay Woods MRN: [id number]  Principal Diagnosis: Cannabis abuse with cannabis-induced disorder [facility])  Secondary Diagnoses: Principal Problem:   Cannabis abuse with cannabis-induced disorder (HCC)   Current Medications:  Current Facility-Administered Medications  Medication Dose Route Frequency Provider Last Rate Last Admin  . acetaminophen (TYLENOL) tablet 650 mg  650 mg Oral Q6H PRN Estella Husk, MD      . alum & mag hydroxide-simeth (MAALOX/MYLANTA) 200-200-20 MG/5ML suspension 30 mL  30 mL Oral Q4H PRN Estella Husk, MD      . benztropine (COGENTIN) tablet 1 mg  1 mg Oral BID PRN Mariel Craft, MD      . divalproex (DEPAKOTE ER) 24 hr tablet 750 mg  750 mg Oral QHS Antonieta Pert, MD      . loratadine (CLARITIN) tablet 10 mg  10 mg Oral Daily Mariel Craft, MD   10 mg at 06/17/20 5809  . magnesium hydroxide (MILK OF MAGNESIA) suspension 30 mL  30 mL Oral Daily PRN Estella Husk, MD      . nicotine polacrilex (NICORETTE) gum 2 mg  2 mg Oral PRN White, Patrice L, NP   2 mg at 06/12/20 2114  . OLANZapine zydis (ZYPREXA) disintegrating tablet 10 mg  10 mg Oral Q8H PRN Estella Husk, MD   10 mg at 06/13/20 1719   And  . ziprasidone (GEODON) injection 20 mg  20 mg Intramuscular PRN Estella Husk, MD      . risperiDONE (RISPERDAL M-TABS) disintegrating tablet 1 mg  1 mg Oral Daily Antonieta Pert, MD   1 mg at 06/17/20 0819  . risperiDONE (RISPERDAL) tablet 4 mg  4 mg Oral QHS Mariel Craft, MD   4 mg at 06/16/20 2045  . traZODone (DESYREL) tablet 50 mg  50 mg Oral QHS,MR X 1 White, Patrice L, NP   50 mg at 06/16/20 2045   PTA Medications: No medications prior to admission.    Patient Stressors:    Patient Strengths:    Treatment Modalities: Medication Management, Group therapy, Case management,  1 to 1 session with clinician,  Psychoeducation, Recreational therapy.   Physician Treatment Plan for Primary Diagnosis: Cannabis abuse with cannabis-induced disorder (HCC) Long Term Goal(s): Improvement in symptoms so as ready for discharge Improvement in symptoms so as ready for discharge   Short Term Goals: Ability to identify changes in lifestyle to reduce recurrence of condition will improve Ability to verbalize feelings will improve Ability to demonstrate self-control will improve Ability to identify and develop effective coping behaviors will improve Ability to maintain clinical measurements within normal limits will improve Compliance with prescribed medications will improve Ability to identify triggers associated with substance abuse/mental health issues will improve Ability to identify changes in lifestyle to reduce recurrence of condition will improve Ability to verbalize feelings will improve Ability to demonstrate self-control will improve Ability to identify and develop effective coping behaviors will improve Ability to maintain clinical measurements within normal limits will improve Compliance with prescribed medications will improve Ability to identify triggers associated with substance abuse/mental health issues will improve  Medication Management: Evaluate patient's response, side effects, and tolerance of medication regimen.  Therapeutic Interventions: 1 to 1 sessions, Unit Group sessions and Medication administration.  Evaluation of Outcomes: Progressing  Physician Treatment Plan for Secondary Diagnosis: Principal Problem:   Cannabis abuse with cannabis-induced disorder (HCC)  Long Term Goal(s): Improvement in symptoms  so as ready for discharge Improvement in symptoms so as ready for discharge   Short Term Goals: Ability to identify changes in lifestyle to reduce recurrence of condition will improve Ability to verbalize feelings will improve Ability to demonstrate self-control will  improve Ability to identify and develop effective coping behaviors will improve Ability to maintain clinical measurements within normal limits will improve Compliance with prescribed medications will improve Ability to identify triggers associated with substance abuse/mental health issues will improve Ability to identify changes in lifestyle to reduce recurrence of condition will improve Ability to verbalize feelings will improve Ability to demonstrate self-control will improve Ability to identify and develop effective coping behaviors will improve Ability to maintain clinical measurements within normal limits will improve Compliance with prescribed medications will improve Ability to identify triggers associated with substance abuse/mental health issues will improve     Medication Management: Evaluate patient's response, side effects, and tolerance of medication regimen.  Therapeutic Interventions: 1 to 1 sessions, Unit Group sessions and Medication administration.  Evaluation of Outcomes: Progressing   RN Treatment Plan for Primary Diagnosis: Cannabis abuse with cannabis-induced disorder (HCC) Long Term Goal(s): Knowledge of disease and therapeutic regimen to maintain health will improve  Short Term Goals: Ability to remain free from injury will improve, Ability to participate in decision making will improve, Ability to verbalize feelings will improve, Ability to disclose and discuss suicidal ideas and Ability to identify and develop effective coping behaviors will improve  Medication Management: RN will administer medications as ordered by provider, will assess and evaluate patient's response and provide education to patient for prescribed medication. RN will report any adverse and/or side effects to prescribing provider.  Therapeutic Interventions: 1 on 1 counseling sessions, Psychoeducation, Medication administration, Evaluate responses to treatment, Monitor vital signs and CBGs as  ordered, Perform/monitor CIWA, COWS, AIMS and Fall Risk screenings as ordered, Perform wound care treatments as ordered.  Evaluation of Outcomes: Progressing   LCSW Treatment Plan for Primary Diagnosis: Cannabis abuse with cannabis-induced disorder (HCC) Long Term Goal(s): Safe transition to appropriate next level of care at discharge, Engage patient in therapeutic group addressing interpersonal concerns.  Short Term Goals: Engage patient in aftercare planning with referrals and resources, Increase social support, Increase emotional regulation, Facilitate acceptance of mental health diagnosis and concerns, Identify triggers associated with mental health/substance abuse issues and Increase skills for wellness and recovery  Therapeutic Interventions: Assess for all discharge needs, 1 to 1 time with Social worker, Explore available resources and support systems, Assess for adequacy in community support network, Educate family and significant other(s) on suicide prevention, Complete Psychosocial Assessment, Interpersonal group therapy.  Evaluation of Outcomes: Progressing   Progress in Treatment: Attending groups: Yes. Participating in groups: Yes. Taking medication as prescribed: Yes. Toleration medication: Yes. Family/Significant other contact made: Yes, individual(s) contacted:  Mother  Patient understands diagnosis: No. Discussing patient identified problems/goals with staff: Yes. Medical problems stabilized or resolved: Yes. Denies suicidal/homicidal ideation: Yes. Issues/concerns per patient self-inventory: No.   New problem(s) identified: No, Describe:  None   New Short Term/Long Term Goal(s): medication stabilization, elimination of SI thoughts, development of comprehensive mental wellness plan.   Patient Goals:  "To help myself and other people"  Discharge Plan or Barriers: Patient is to return home ad is to follow up with [facility] for therapy and medication management  Reason  for Continuation of Hospitalization: Delusions  Hallucinations Medication stabilization  Estimated Length of Stay: 3 to 5 days   Attendees: Patient:  06/12/2020  Physician:  06/12/2020   Nursing:  06/12/2020   RN Care Manager: 06/12/2020   Social Worker: Ruthann Cancer, LCSW 06/12/2020   Recreational Therapist:  06/12/2020   Other:  06/12/2020   Other:  06/12/2020   Other: 06/12/2020     Scribe for Treatment Team: Otelia Santee, LCSW 06/17/2020 2:27 PM

## 2020-06-17 NOTE — [facility] Group Notes (Signed)
Type of Therapy and Topic:  Group Therapy - Healthy vs Unhealthy Coping Skills  Participation Level:  Active   Description of Group The focus of this group was to determine what unhealthy coping techniques typically are used by group members and what healthy coping techniques would be helpful in coping with various problems. Patients were guided in becoming aware of the differences between healthy and unhealthy coping techniques. Patients were asked to identify 2-3 healthy coping skills they would like to learn to use more effectively.  Therapeutic Goals 1. Patients learned that coping is what human beings do all day long to deal with various situations in their lives 2. Patients defined and discussed healthy vs unhealthy coping techniques 3. Patients identified their preferred coping techniques and identified whether these were healthy or unhealthy 4. Patients determined 2-3 healthy coping skills they would like to become more familiar with and use more often. 5. Patients provided support and ideas to each other   Summary of Patient Progress:  Jay Woods participated in group and spoke with his peers about self-care and coping skills.  Jay Woods spent time outside playing basketball and states that this is one of his healthy coping skills.    Therapeutic Modalities Cognitive Behavioral Therapy Motivational Interviewing

## 2020-06-17 NOTE — Progress Notes (Addendum)
[facility]  06/20/2020 7:24 AM Jay Woods  MRN:  [id number]   Chief Complaint: Psychosis  Subjective: Jay Woods is a 20 y.o. male with a past psychiatric history significant for  IED, substance induced mood d/o, and psychosis, who was initially admitted for inpatient psychiatric hospitalization on 06/10/2020 for management of paranoia, hyper-religious thoughts, disorganized thinking, and aggressive behaviors. The patient is currently on Hospital Day 10.   Chart Review from last 24 hours:  The patient's chart was reviewed and nursing notes were reviewed. The patient's case was discussed in multidisciplinary team meeting. Per nursing, patient had no behavioral or safety issues noted. [facility] and required no PRNS for anxiety or agitation.  Information Obtained Today During Patient Interview: The patient was seen and evaluated on the unit. On assessment today the patient reports that he has good sleep and appetite. He voices no side-effects with his medications and denies physical complaints today. He denies AVH but admits to residual paranoia. He makes reference to worry that "guys" in the community may be jealous of him or "after him" once he is discharged because girls are attracted to him. He denies first rank symptoms or ideas of reference. He proceeds to show this examiner his Bible which is stuffed with slips of paper, packets of hot sauce, and napkins as place holders in the book. He make frequent hyper-religious reference to what scriptures he has been reading. He states [place] and goes on a tangent discussing his plans for walking at the beach and what he will wear at the beach after discharge. He denies SI or HI. He admits to smoking THC daily prior to admission and admits to intermittent methamphetamine and cocaine use prior to admission. Time was  spent discussing the need for abstinence from illicit substances after discharge.   Principal Problem: Schizophrenia spectrum disorder with psychotic disorder type not yet determined (HCC) Diagnosis: Principal Problem:   Schizophrenia spectrum disorder with psychotic disorder type not yet determined (HCC) Active Problems:   Marijuana abuse  Total Time Spent in Direct Patient Care:  I personally spent 30 minutes on the unit in direct patient care. The direct patient care time included face-to-face time with the patient, reviewing the patient's chart, communicating with other professionals, and coordinating care. Greater than 50% of this time was spent in counseling or coordinating care with the patient regarding goals of hospitalization, psycho-education, and discharge planning needs.  Past Psychiatric History: see admission H&P  Past Medical History:  Past Medical History:  Diagnosis Date  . Eczema    Family History: see admission H&P  Family Psychiatric  History: see admission H&P  Social History:  Social History   Substance and Sexual Activity  Alcohol Use Yes  . Alcohol/week: 1.0 standard drink  . Types: 1 Cans of beer per week   Comment: twice/week     Social History   Substance and Sexual Activity  Drug Use Yes  . Types: Marijuana    Social History   Socioeconomic History  . Marital status: Single    Spouse name: Not on file  . Number of children: Not on file  . Years of education: Not on file  . Highest education level: Not on file  Occupational History  . Not on file  Tobacco Use  . Smoking status: Former Smoker    Packs/day: 1.00    Years: 5.00  Pack years: 5.00  . Smokeless tobacco: Former Clinical biochemist  . Vaping Use: Some days  . Substances: CBD  Substance and Sexual Activity  . Alcohol use: Yes    Alcohol/week: 1.0 standard drink    Types: 1 Cans of beer per week    Comment: twice/week  . Drug use: Yes    Types: Marijuana  . Sexual  activity: Not Currently  Other Topics Concern  . Not on file  Social History Narrative  . Not on file   Social Determinants of Health   Financial Resource Strain: Not on file  Food Insecurity: Not on file  Transportation Needs: Not on file  Physical Activity: Not on file  Stress: Not on file  Social Connections: Not on file   Sleep: Good  Appetite:  Good  Current Medications: Current Facility-Administered Medications  Medication Dose Route Frequency Provider Last Rate Last Admin  . acetaminophen (TYLENOL) tablet 650 mg  650 mg Oral Q6H PRN Estella Husk, MD   650 mg at 06/19/20 1650  . alum & mag hydroxide-simeth (MAALOX/MYLANTA) 200-200-20 MG/5ML suspension 30 mL  30 mL Oral Q4H PRN Estella Husk, MD      . benztropine (COGENTIN) tablet 1 mg  1 mg Oral BID PRN Mariel Craft, MD      . divalproex (DEPAKOTE ER) 24 hr tablet 1,000 mg  1,000 mg Oral QHS Antonieta Pert, MD   1,000 mg at 06/19/20 2149  . loratadine (CLARITIN) tablet 10 mg  10 mg Oral Daily Mariel Craft, MD   10 mg at 06/19/20 0919  . magnesium hydroxide (MILK OF MAGNESIA) suspension 30 mL  30 mL Oral Daily PRN Estella Husk, MD      . metoprolol tartrate (LOPRESSOR) tablet 25 mg  25 mg Oral BID Antonieta Pert, MD   25 mg at 06/19/20 1651  . nicotine polacrilex (NICORETTE) gum 2 mg  2 mg Oral PRN White, Patrice L, NP   2 mg at 06/12/20 2114  . OLANZapine zydis (ZYPREXA) disintegrating tablet 10 mg  10 mg Oral Q8H PRN Estella Husk, MD   10 mg at 06/13/20 1719   And  . ziprasidone (GEODON) injection 20 mg  20 mg Intramuscular PRN Estella Husk, MD      . risperiDONE (RISPERDAL M-TABS) disintegrating tablet 2 mg  2 mg Oral Daily Antonieta Pert, MD   2 mg at 06/19/20 0920  . risperiDONE (RISPERDAL) tablet 4 mg  4 mg Oral QHS Mariel Craft, MD   4 mg at 06/19/20 2150  . traZODone (DESYREL) tablet 50 mg  50 mg Oral QHS,MR X 1 White, Patrice L, NP   50 mg at 06/19/20  2149    Lab Results:  No results found for this or any previous visit (from the past 48 hour(s)).  Blood Alcohol level:  Lab Results  Component Value Date   [facility] <10 06/09/2020   ETH <10 12/06/2018    Metabolic Disorder Labs: Lab Results  Component Value Date   HGBA1C 5.6 06/09/2020   MPG 114 06/09/2020   MPG 99.67 02/28/2017   Lab Results  Component Value Date   PROLACTIN 4.4 06/09/2020   Lab Results  Component Value Date   CHOL 142 06/09/2020   TRIG 51 06/09/2020   HDL 51 06/09/2020   CHOLHDL 2.8 06/09/2020   VLDL 10 06/09/2020   LDLCALC 81 06/09/2020   LDLCALC 98 02/28/2017    Physical Findings:  AIMS: Facial and Oral Movements Muscles of Facial Expression: None, normal Lips and Perioral Area: None, normal Jaw: None, normal Tongue: None, normal,Extremity Movements Upper (arms, wrists, hands, fingers): None, normal Lower (legs, knees, ankles, toes): None, normal, Trunk Movements Neck, shoulders, hips: None, normal, Overall Severity Severity of abnormal movements (highest score from questions above): None, normal Incapacitation due to abnormal movements: None, normal Patient's awareness of abnormal movements (rate only patient's report): No Awareness, Dental Status Current problems with teeth and/or dentures?: No Does patient usually wear dentures?: No      Psychiatric Specialty Exam: Physical Exam Vitals reviewed.  HENT:     Head: Normocephalic.  Pulmonary:     Effort: Pulmonary effort is normal.  Neurological:     Mental Status: He is alert.     Review of Systems  Respiratory: Negative for shortness of breath.   Cardiovascular: Negative for chest pain.  Gastrointestinal: Negative for diarrhea, nausea and vomiting.    Blood pressure 135/75, pulse 78, temperature 97.8 F (36.6 C), temperature source Oral, resp. rate 18, height 5\' 8"  (1.727 m), weight 56.7 kg, SpO2 100 %.Body mass index is 19.01 kg/m.  General Appearance: casually dressed, adequate  hygiene, appears stated age  Eye Contact:  Good  Speech:  Clear and Coherent and Normal Rate  Volume:  Normal  Mood:  mildly elevated  Affect:  Labile - at times giddy and child-like alternating with teary episodes  Thought Process:  Goal Directed with periods of tangential organization  Orientation:  Oriented to year, self, and city but not month  Thought Content:  Denies AVH, ideas of reference, or first rank symptoms but has grandiose and persecutory delusions on exam; hyper-religious content  Suicidal Thoughts:  No  Homicidal Thoughts:  No  Memory:  Recent;   Fair  Judgement:  Fair  Insight:  Lacking  Psychomotor Activity:  Normal  Concentration:  Concentration: Fair  Recall:  of Knowledge:  Fair  Language:  Good  Akathisia:  Negative  Assets:  Communication Skills Desire for Improvement Housing Resilience Social Support  ADL's:  Intact  Cognition:  WNL  Sleep:  Number of Hours: 6.5   Treatment Plan Summary: Diagnoses / Active Problems: Unspecified schizophrenia spectrum and other psychotic d/o (r/o substance induced psychotic d/o, r/o bipolar I MRE manic with psychotic features, r/o schizoaffective d/o; r/o schizophrenia) Cannabis use d/o Elevated LFTs HTN and tachycardia Low TSH  PLAN: 1. Safety and Monitoring:  -- Involuntary admission to inpatient psychiatric unit for safety, stabilization and treatment  -- Daily contact with patient to assess and evaluate symptoms and progress in treatment  -- Patient's case to be discussed in multi-disciplinary team meeting  -- Observation Level : q15 minute checks  -- Vital signs:  q12 hours  -- Precautions: suicide  2. Psychiatric Diagnoses and Treatment:   Unspecified schizophrenia spectrum and other psychotic d/o (r/o substance induced psychotic d/o, r/o bipolar I MRE manic with psychotic features, r/o schizoaffective d/o; r/o schizophrenia) -- Continue VPA ER 1000mg  qhs (Checking VPA level, CBC, LFT  tomorrow) -- Continue Risperdal M tabs 2mg  qam and 4mg  qhs - consider transition to Haldol or Zyprexa if he does not improve clinically  -- Continue Trazodone 50mg  po qhs for insomnia -- Continue Cogentin 1mg  bid PRN EPS/tremors -- Continue Zyprexa zydis agitation protocol PRN  -- Metabolic profile and EKG monitoring obtained while on an atypical antipsychotic  Lipid Panel:cholesterol 142, triglycerides 51, HDL 51, LDL 81; HbgA1c:5.6; QTc:455ms)   -- Encouraged  patient to participate in unit milieu and in scheduled group therapies   -- Short Term Goals: Ability to identify changes in lifestyle to reduce recurrence of condition will improve, Ability to demonstrate self-control will improve and Ability to identify and develop effective coping behaviors will improve  -- Long Term Goals: Improvement in symptoms so as ready for discharge   Cannabis use d/o  -- UDS positive for THC  -- Counseled on the need to abstain from illicit substances after discharge  -- Short Term Goals: Ability to identify triggers associated with substance abuse/mental health issues will improve  -- Long Term Goals: Improvement in symptoms so as ready for discharge   3. Medical Issues Being Addressed:   High Risk Medication Use: -- The complexity of this patient's case involves drug therapy with Depakote which requires intensive monitoring for toxicity. This is in part due to a narrow therapeutic window as well as potential for toxicity with this medication.   Elevated LFTS on admission - resolved -- Repeat hepatic function panel pending  Low TSH 0.318 0n admission  -- Free T4 normal at 0.61 T3U pending and repeat TSH normalized to 1.385   Abnormal EKG - resolved  -- Repeat EKG 06/19/20 shows NSR 75bpm with QTc with no evidence of infarct or ischemic changes   HTN and transient tachycardia  -- Continue Lopressor 25mg  bid  4. Discharge Planning:   -- Social work and case management to assist with discharge  planning and identification of hospital follow-up needs prior to discharge  -- Estimated LOS: 3-4 days  -- Discharge Concerns: Need to establish a safety plan; Medication compliance and effectiveness  -- Discharge Goals: Return home with outpatient referrals for mental health follow-up including medication management/psychotherapy  , MD, FAPA 06/20/2020, 7:24 AM

## 2020-06-17 NOTE — [facility] Group Notes (Signed)
[facility] Group Notes:  (Nursing/MHT/Case Management/Adjunct)  Date:  06/17/2020  Time:  9:38 AM  Type of Therapy:  Group Therapy  Participation Level:  Active  Participation Quality:  Attentive  Affect:  Appropriate  Cognitive:  Alert and Appropriate  Insight:  Appropriate and Good  Engagement in Group:  Engaged  Modes of Intervention:  Orientation  Summary of Progress/Problems: His goal for today is to be more patient with everything.   Joycelyn Liska J Javad Salva 06/17/2020, 9:38 AM

## 2020-06-17 NOTE — Progress Notes (Signed)
   06/17/20 0736  Vital Signs  Temp 98.4 F (36.9 C)  Temp Source Oral  Pulse Rate 80  BP 126/74  BP Method Automatic  Oxygen Therapy  SpO2 100 %   D: Patient denies SI/HI/AVH. Patient rated anxiety 1/10 and denied depression. Pt. Was up smiling at the med counter, but was back away from the counter. Pt. Out in open areas and attended group. Pt. Social with peers and staff.  A:  Patient took scheduled medicine.  Support and encouragement provided Routine safety checks conducted every 15 minutes. Patient  Informed to notify staff with any concerns.   R:  Safety maintained.

## 2020-06-17 NOTE — Progress Notes (Signed)
Adult Psychoeducational Group Note  Date:  06/17/2020 Time:  10:13 PM  Group Topic/Focus:  Wrap-Up Group:   The focus of this group is to help patients review their daily goal of treatment and discuss progress on daily workbooks.  Participation Level:  Minimal  Participation Quality:  Appropriate  Affect:  Appropriate  Cognitive:  Oriented  Insight: Appropriate  Engagement in Group:  Engaged  Modes of Intervention:  Education  Additional Comments:  Patient attended and participated in group tonight.  Lita Mains [facility] 06/17/2020, 10:13 PM

## 2020-06-17 NOTE — Progress Notes (Addendum)
   06/17/20 2201  Psych Admission Type (Psych Patients Only)  Admission Status Involuntary  Psychosocial Assessment  Patient Complaints None  Eye Contact Fair  Facial Expression Animated  Affect Anxious  Speech Rapid;Pressured  Interaction Childlike  Motor Activity Fidgety;Restless  Appearance/Hygiene Unremarkable  Behavior Characteristics Anxious  Mood Anxious  Thought Process  Coherency WDL  Content WDL  Delusions None reported or observed  Perception WDL  Hallucination None reported or observed  Judgment WDL  Confusion None  Danger to Self  Current suicidal ideation? Denies  Danger to Others  Danger to Others None reported or observed   Denies SI, HI, AVH and pain. Still has a sore on the front of his left lower leg d/t ankle bracelet. Covered with bandage. Says he had a good day.

## 2020-06-18 MED ORDER — RISPERIDONE 2 MG PO TBDP
2.0000 mg | ORAL_TABLET | Freq: Every day | ORAL | Status: DC
  Administered 2020-06-19 – 2020-06-26 (×8): 2 mg via ORAL
  Filled 2020-06-18 (×9): qty 1

## 2020-06-18 MED ORDER — METOPROLOL TARTRATE 25 MG PO TABS
25.0000 mg | ORAL_TABLET | Freq: Two times a day (BID) | ORAL | Status: DC
  Administered 2020-06-18 – 2020-06-26 (×16): 25 mg via ORAL
  Filled 2020-06-18 (×11): qty 1
  Filled 2020-06-18: qty 14
  Filled 2020-06-18 (×3): qty 1
  Filled 2020-06-18: qty 14
  Filled 2020-06-18 (×4): qty 1

## 2020-06-18 MED ORDER — DIVALPROEX SODIUM ER 500 MG PO TB24
1000.0000 mg | ORAL_TABLET | Freq: Every day | ORAL | Status: DC
  Administered 2020-06-18 – 2020-06-20 (×3): 1000 mg via ORAL
  Filled 2020-06-18 (×4): qty 2

## 2020-06-18 MED ORDER — RISPERIDONE 1 MG PO TBDP
1.0000 mg | ORAL_TABLET | ORAL | Status: AC
  Administered 2020-06-18: 1 mg via ORAL
  Filled 2020-06-18: qty 1

## 2020-06-18 NOTE — Progress Notes (Signed)
SPIRITUALITY GROUP NOTE  Spirituality group facilitated by Wilkie Aye, MDiv, [facility].  Group Description:  Group focused on topic of hope.  Patients participated in facilitated discussion around topic, connecting with one another around experiences and definitions for hope.  Group members engaged with visual explorer photos, reflecting on what hope looks like for them today.  Group engaged in discussion around how their definitions of hope are present today in hospital.   Modalities: Psycho-social ed, Adlerian, Narrative, MI Patient Progress: Pt was present for group  Pt spoke about hope in his life and how is faith is very important to him   Leane Para  Counseling Intern @ [facility]

## 2020-06-18 NOTE — Progress Notes (Signed)
   06/18/20 0611  Vital Signs  Temp (!) 97.3 F (36.3 C)  Temp Source Oral  Pulse Rate 98  BP (!) 148/88  BP Location Right Arm  BP Method Automatic  Patient Position (if appropriate) Sitting  Oxygen Therapy  SpO2 99 %    D:Patient denies SI/HI/AVH. Patient rated anxiety 1/10 and denied depression. After breakfast pt. Was wheeled back to the unit in a wheelchair because his "right knee gave out".  Later pt walked around unencumbered. Pt. Was given yellow sox, and yellow armband to indicate fall risk. Pt.'s chart, check sheet and door were stickered with fall risk stickers.  A:  Patient took scheduled medicine. PT's right knee pain was relieved with 650 mg of Tylenol.  Support and encouragement provided Routine safety checks conducted every 15 minutes. Patient  Informed to notify staff with any concerns.   R:  Safety maintained.

## 2020-06-18 NOTE — [facility] Suicide Risk Assessment (Addendum)
[facility] INPATIENT:  Family/Significant Other Suicide Prevention Education   Suicide Prevention Education: Education Completed; mother Cher Nakai [phone]) and father, Evelene Croon [phone]), has been identified by the patient as the family member/significant other with whom the patient will be residing, and identified as the person(s) who will aid the patient in the event of a mental health crisis (suicidal ideations/suicide attempt).  With written consent from the patient, the family member/significant other has been provided the following suicide prevention education, prior to the and/or following the discharge of the patient.  The suicide prevention education provided includes the following:  Suicide risk factors  Suicide prevention and interventions  National Suicide Hotline telephone number  [facility] assessment telephone number  [facility] Emergency Assistance 911  [facility] and/or Residential Mobile Crisis Unit telephone number   Request made of family/significant other to:  Remove weapons (e.g., guns, rifles, knives), all items previously/currently identified as safety concern.    Remove drugs/medications (over-the-counter, prescriptions, illicit drugs), all items previously/currently identified as a safety concern.   The family member/significant other verbalizes understanding of the suicide prevention education information provided.  The family member/significant other agrees to remove the items of safety concern listed above.     CSW spoke to this patients mother and father who agreed this patient is to stay with his father in the [place] at discharge. Pt's parents had multiple questions around this patients hospitalization and mental health concerns in the future.   Pt's father stated he would not be able to pick this patient up until the following Monday, 06/24/2020 due to working all of this week. He stated he would prefer having the time  off of work to help this pt stay away from substance use. CSW informed parents that it is unlikely that we are able to treat this patient until then, and may be released middle to end of this week.    Ruthann Cancer MSW, LCSW Clincal Social Worker  [facility]

## 2020-06-18 NOTE — Progress Notes (Addendum)
   06/18/20 2000  Psych Admission Type (Psych Patients Only)  Admission Status Involuntary  Psychosocial Assessment  Patient Complaints None  Eye Contact Fair  Facial Expression Animated  Affect Anxious  Speech Rapid;Pressured  Interaction Childlike  Motor Activity Fidgety;Restless  Appearance/Hygiene Unremarkable  Behavior Characteristics Cooperative  Mood Pleasant  Thought Process  Coherency WDL  Content WDL  Delusions None reported or observed  Perception WDL  Hallucination None reported or observed  Judgment WDL  Confusion None  Danger to Self  Current suicidal ideation? Denies  Danger to Others  Danger to Others None reported or observed   Pt very euphoric today. Pt denies SI, HI, AVH. Pt rates pain 4/10 in his right knee. Apparently, his right knee got weak this morning in the cafeteria because his leg fell asleep. Had to be brought back to the unit in a wheelchair. Pt still speaking about angels on the unit but is less intrusive and active in milieu.

## 2020-06-18 NOTE — [facility] Group Notes (Signed)
[facility] Group Notes:  (Nursing/MHT/Case Management/Adjunct)  Date:  06/18/2020  Time:  8:30 AM  Type of Therapy:  Group Therapy  Participation Level:  Active  Participation Quality:  Appropriate  Affect:  Appropriate  Cognitive:  Appropriate  Insight:  Good and Improving  Engagement in Group:  Engaged  Modes of Intervention:  Orientation  Summary of Progress/Problems: His goal for today is to have his leg feel better and to continue to take his medications so that he can get home and see his mother.   Frimet Durfee J Tyyne Cliett 06/18/2020, 8:30 AM

## 2020-06-18 NOTE — Progress Notes (Signed)
[facility] MD Progress Note  06/18/2020 12:01 PM Jay Woods  MRN:  [id number] Subjective:  Patient is a 20 year old male with a reported past psychiatric history significant for substance-induced psychotic disorder who was admitted on 06/09/2020 secondary to psychotic symptoms, grandiosity and paranoia.  Objective: Patient is seen and examined.  Patient is a 20 year old male with the above-stated past psychiatric history who is seen in follow-up.  Patient is clearly showing evidence of bipolar disorder.  He continues to be euphoric at times and labile.  Somewhat tangential.  Today while were talking he is normal, then talks about "the angels on the ward, I just love them", and then goes into tearfulness.  His Depakote was increased yesterday to 750 mg p.o. nightly.  He denied any auditory or visual hallucinations.  He does have some grandiose delusions, tangentiality and euphoria.  Between the antipsychotics and mood stabilizers he did sleep well last night.  His blood pressure remains elevated.  Initially it was 148/88, repeat was 156/73.  He is afebrile.  Pulse was between 98 and 107.  Pulse oximetry was 99% on room air.  EKG from 4/24 showed a normal sinus rhythm with a normal QTc interval.  Principal Problem: Cannabis abuse with cannabis-induced disorder (HCC) Diagnosis: Principal Problem:   Cannabis abuse with cannabis-induced disorder (HCC)  Total Time spent with patient: 20 minutes  Past Psychiatric History: See admission H&P  Past Medical History:  Past Medical History:  Diagnosis Date  . Eczema    History reviewed. No pertinent surgical history. Family History: History reviewed. No pertinent family history. Family Psychiatric  History: See admission H&P Social History:  Social History   Substance and Sexual Activity  Alcohol Use Yes  . Alcohol/week: 1.0 standard drink  . Types: 1 Cans of beer per week   Comment: twice/week     Social History   Substance and Sexual  Activity  Drug Use Yes  . Types: Marijuana    Social History   Socioeconomic History  . Marital status: Single    Spouse name: Not on file  . Number of children: Not on file  . Years of education: Not on file  . Highest education level: Not on file  Occupational History  . Not on file  Tobacco Use  . Smoking status: Former Smoker    Packs/day: 1.00    Years: 5.00    Pack years: 5.00  . Smokeless tobacco: Former Clinical biochemist  . Vaping Use: Some days  . Substances: CBD  Substance and Sexual Activity  . Alcohol use: Yes    Alcohol/week: 1.0 standard drink    Types: 1 Cans of beer per week    Comment: twice/week  . Drug use: Yes    Types: Marijuana  . Sexual activity: Not Currently  Other Topics Concern  . Not on file  Social History Narrative  . Not on file   Social Determinants of Health   Financial Resource Strain: Not on file  Food Insecurity: Not on file  Transportation Needs: Not on file  Physical Activity: Not on file  Stress: Not on file  Social Connections: Not on file   Additional Social History:                         Sleep: Good  Appetite:  Fair  Current Medications: Current Facility-Administered Medications  Medication Dose Route Frequency Provider Last Rate Last Admin  . acetaminophen (TYLENOL) tablet 650 mg  650 mg  Oral Q6H PRN Estella Husk, MD   650 mg at 06/18/20 0815  . alum & mag hydroxide-simeth (MAALOX/MYLANTA) 200-200-20 MG/5ML suspension 30 mL  30 mL Oral Q4H PRN Estella Husk, MD      . benztropine (COGENTIN) tablet 1 mg  1 mg Oral BID PRN Mariel Craft, MD      . divalproex (DEPAKOTE ER) 24 hr tablet 750 mg  750 mg Oral QHS Antonieta Pert, MD   750 mg at 06/17/20 2132  . loratadine (CLARITIN) tablet 10 mg  10 mg Oral Daily Mariel Craft, MD   10 mg at 06/18/20 0813  . magnesium hydroxide (MILK OF MAGNESIA) suspension 30 mL  30 mL Oral Daily PRN Estella Husk, MD      . nicotine polacrilex  (NICORETTE) gum 2 mg  2 mg Oral PRN White, Patrice L, NP   2 mg at 06/12/20 2114  . OLANZapine zydis (ZYPREXA) disintegrating tablet 10 mg  10 mg Oral Q8H PRN Estella Husk, MD   10 mg at 06/13/20 1719   And  . ziprasidone (GEODON) injection 20 mg  20 mg Intramuscular PRN Estella Husk, MD      . risperiDONE (RISPERDAL M-TABS) disintegrating tablet 1 mg  1 mg Oral NOW Antonieta Pert, MD      . Melene Muller ON 06/19/2020] risperiDONE (RISPERDAL M-TABS) disintegrating tablet 2 mg  2 mg Oral Daily Antonieta Pert, MD      . risperiDONE (RISPERDAL) tablet 4 mg  4 mg Oral QHS Mariel Craft, MD   4 mg at 06/17/20 2132  . traZODone (DESYREL) tablet 50 mg  50 mg Oral QHS,MR X 1 White, Patrice L, NP   50 mg at 06/17/20 2138    Lab Results:  Results for orders placed or performed during the hospital encounter of 06/10/20 (from the past 48 hour(s))  Valproic acid level     Status: Abnormal   Collection Time: 06/17/20  6:37 AM  Result Value Ref Range   Valproic Acid Lvl 25 (L) 50.0 - 100.0 ug/mL    Comment: Performed at [facility], [address] [place], [place] [address]  CBC with Differential/Platelet     Status: None   Collection Time: 06/17/20  6:37 AM  Result Value Ref Range   WBC 7.3 4.0 - 10.5 K/uL   RBC 4.74 4.22 - 5.81 MIL/uL   Hemoglobin 14.9 13.0 - 17.0 g/dL   HCT 26.7 12.4 - 58.0 %   MCV 94.5 80.0 - 100.0 fL   MCH 31.4 26.0 - 34.0 pg   MCHC 33.3 30.0 - 36.0 g/dL   RDW 99.8 33.8 - 25.0 %   Platelets 287 150 - 400 K/uL   nRBC 0.0 0.0 - 0.2 %   Neutrophils Relative % 55 %   Neutro Abs 4.0 1.7 - 7.7 K/uL   Lymphocytes Relative 33 %   Lymphs Abs 2.4 0.7 - 4.0 K/uL   Monocytes Relative 9 %   Monocytes Absolute 0.7 0.1 - 1.0 K/uL   Eosinophils Relative 2 %   Eosinophils Absolute 0.1 0.0 - 0.5 K/uL   Basophils Relative 1 %   Basophils Absolute 0.1 0.0 - 0.1 K/uL   Immature Granulocytes 0 %   Abs Immature Granulocytes 0.02 0.00 - 0.07 K/uL     Comment: Performed at [facility], [address], [place], [place] [address]  Hepatic function panel     Status: None  Collection Time: 06/17/20  6:37 AM  Result Value Ref Range   Total Protein 7.6 6.5 - 8.1 g/dL   Albumin 4.4 3.5 - 5.0 g/dL   AST 17 15 - 41 U/L   ALT 27 0 - 44 U/L   Alkaline Phosphatase 43 38 - 126 U/L   Total Bilirubin 0.5 0.3 - 1.2 mg/dL   Bilirubin, Direct <1.6<0.1 0.0 - 0.2 mg/dL   Indirect Bilirubin NOT CALCULATED 0.3 - 0.9 mg/dL    Comment: Performed at [facility], [address] [place], [place] [id number]    Blood Alcohol level:  Lab Results  Component Value Date   ETH <10 06/09/2020   ETH <10 12/06/2018    Metabolic Disorder Labs: Lab Results  Component Value Date   HGBA1C 5.6 06/09/2020   MPG 114 06/09/2020   MPG 99.67 02/28/2017   Lab Results  Component Value Date   PROLACTIN 4.4 06/09/2020   Lab Results  Component Value Date   CHOL 142 06/09/2020   TRIG 51 06/09/2020   HDL 51 06/09/2020   CHOLHDL 2.8 06/09/2020   VLDL 10 06/09/2020   LDLCALC 81 06/09/2020   LDLCALC 98 02/28/2017    Physical Findings: AIMS: Facial and Oral Movements Muscles of Facial Expression: None, normal Lips and Perioral Area: None, normal Jaw: None, normal Tongue: None, normal,Extremity Movements Upper (arms, wrists, hands, fingers): None, normal Lower (legs, knees, ankles, toes): None, normal, Trunk Movements Neck, shoulders, hips: None, normal, Overall Severity Severity of abnormal movements (highest score from questions above): None, normal Incapacitation due to abnormal movements: None, normal Patient's awareness of abnormal movements (rate only patient's report): No Awareness, Dental Status Current problems with teeth and/or dentures?: No Does patient usually wear dentures?: No  CIWA:    COWS:     Musculoskeletal: Strength & Muscle Tone: within normal limits Gait & Station: normal Patient leans:  N/A  Psychiatric Specialty Exam:  Presentation  General Appearance: Casual  Eye Contact:Fair  Speech:Normal Rate  Speech Volume:Increased  Handedness:Right   Mood and Affect  Mood:Labile  Affect:Labile   Thought Process  Thought Processes:Goal Directed  Descriptions of Associations:Tangential  Orientation:Full (Time, Place and Person)  Thought Content:Delusions; Rumination; Tangential  History of Schizophrenia/Schizoaffective disorder:No  Duration of Psychotic Symptoms:Less than six months  Hallucinations:Hallucinations: None  Ideas of Reference:Delusions; Paranoia  Suicidal Thoughts:Suicidal Thoughts: No  Homicidal Thoughts:Homicidal Thoughts: No   Sensorium  Memory:Immediate Fair; Recent Fair; Remote Fair  Judgment:Poor  Insight:Fair   Executive Functions  Concentration:Fair  Attention Span:Fair  Recall:Fair  Fund of Knowledge:Fair  Language:Fair   Psychomotor Activity  Psychomotor Activity:Psychomotor Activity: Increased   Assets  Assets:Desire for Improvement; Resilience   Sleep  Sleep:Sleep: Good Number of Hours of Sleep: 6.5    Physical Exam: Physical Exam Vitals and nursing note reviewed.  Constitutional:      Appearance: Normal appearance.  HENT:     Head: Normocephalic and atraumatic.  Pulmonary:     Effort: Pulmonary effort is normal.  Neurological:     General: No focal deficit present.     Mental Status: He is alert and oriented to person, place, and time.    ROS Blood pressure (!) 156/73, pulse (!) 107, temperature (!) 97.3 F (36.3 C), temperature source Oral, resp. rate 18, height 5\' 8"  (1.727 m), weight 56.7 kg, SpO2 99 %. Body mass index is 19.01 kg/m.   Treatment Plan Summary: Daily contact with patient to assess and evaluate symptoms and progress in treatment, Medication management  and Plan : Patient is seen and examined.  Patient is a 20 year old male with the above-stated past psychiatric history  who is seen in follow-up.   Diagnosis: 1. Substance-induced psychotic disorder although I suspect this is new onset bipolar disorder; most recently mixed with psychotic features 2. Cannabis dependence 3. Abnormal liver function enzymes; resolved 4.  Hypertension. 5.  Tachycardia.  Pertinent findings on examination today: 1.  Patient shows evidence of euphoria, tangentiality and lability.  I think this lends to the credence to the possibility of a diagnosis of bipolar disorder. 2.  Blood pressure remains elevated. 3.  Depakote ER was increased yesterday. 4.  Liver function enzyme abnormalities have resolved. 5.  Disposition may still be returning to his father's.  Plan: 1.  Increase Depakote ER to 1000 mg p.o. every afternoon for mood stability. 2.  Continue Claritin 10 mg p.o. daily for seasonal allergies. 3.  Give Risperdal 1 mg p.o. now x1 secondary to lability. 4.  Continue Risperdal 2 mg p.o. daily and 4 mg p.o. nightly for mood stability and psychosis. 5.  Add metoprolol tartrate 25 mg p.o. twice daily for tachycardia and hypertension. 6.  Disposition planning-in progress. Antonieta Pert, MD 06/18/2020, 12:01 PM

## 2020-06-18 NOTE — Progress Notes (Signed)
Recreation Therapy Notes  Date: 4.26.22 Time: 1000 Location: 500 Hall Dayroom  Group Topic: Wellness  Goal Area(s) Addresses:  Patient will define components of whole wellness. Patient will verbalize benefit of whole wellness.  Behavioral Response: Engaged  Intervention: Exercise  Activity:  Patients participated in a range of exercises.  Each patient had the opportunity to pick stretches and exercises of their choosing to lead the group in.  The goal was to get at least 30 minutes of active movement to loosen the muscles, clear the mind and let go of any frustrations.  After exercising patients were given two worksheets.  One worksheet gave examples of some things patients can do to help with total wellbeing such as eating right, taking a walk, getting proper sleep, etc.  The other sheet taught patients about the different types of exercise (anaerobic and aerobic).  It also left space for patients to plan out when they can exercise and the types of exercise they want to do.    Education: Wellness, Building control surveyor.   Education Outcome: Acknowledges education/In group clarification offered/Needs additional education.   Clinical Observations/Feedback:  Pt came in a little late to group. Pt was able to join right in with the exercises.  Pt active but needed some assistance with coming up with cool down exercise for the group.  Pt was appropriate and engaged.  Pt expressed exercising helped the bones and muscles to be stronger.    Caroll Rancher, LRT/CTRS     Lillia Abed, Finnis Colee A 06/18/2020 11:10 AM

## 2020-06-19 NOTE — Progress Notes (Signed)
Recreation Therapy Notes  Date: 4.27.22 Time: 1000 Location: 500 Hall Dayroom  Group Topic: Self-Esteem  Goal Area(s) Addresses:  Patient will successfully identify positive attributes about themselves.  Patient will successfully identify benefit of improved self-esteem.   Behavioral Response: Engaged  Intervention: Nurse, learning disability; Markers  Activity: The Mask.  LRT and patients discussed the things that influence the way we see ourselves.  LRT brought up the fact that we as people are quick to name the negative things about ourselves and are unable to name the positive.  Some of this comes from the environment around [place] forcing their ideas of beauty and what is acceptable into our subconscious and we take on those thoughts without realizing it.  Patients were given a blank mask and markers.  Patients were to highlight the positive attributes they have that are often hidden behind the mask.  Patients would then share what they were able to bring out about themselves.  Education:  Self-Esteem, Discharge Planning  Education Outcome: Acknowledges education/In group clarification offered/Needs additional education  Clinical Observations/Feedback: Patient drew a hat on his mask that said sorry "like the [employer]  Patient also wrote believes  In faith, soccer ball/net because he likes to play soccer, a mask and a microphone with a button in case he wants to lip syncing.  Patient was bright and engaged with the activity.   Caroll Rancher, LRT/CTRS    Caroll Rancher A 06/19/2020 11:39 AM

## 2020-06-19 NOTE — Progress Notes (Signed)
[facility] MD Progress Note  06/19/2020 11:18 AM Jay Woods  MRN:  [id number] Subjective:  Patient is a 20 year old male with a reported past psychiatric history significant for substance-induced psychotic disorder who was admitted on 06/09/2020 secondary to psychotic symptoms, grandiosity and paranoia.  Objective: Patient is seen and examined.  Patient is a 20 year old male with the above-stated past psychiatric history who is seen in follow-up.  He continues to fulfill criteria most likely for bipolar disorder.  He remains labile.  He is grandiose about "angels".  He can be talking normally, and then go into tears.  I increased his Depakote last night, and his sleep improved to a degree, but he remains still labile.  He stated that his mother knows him "better than anybody" and that she can pick him up this afternoon.  I told him he was not ready for discharge.  Social work did contact his mother and father.  Patient's mother and father stated that the patient would be able to stay with his father in the [place] after discharge.  They did state they would been unable to pick him up until Monday.  He denied auditory or visual loose [place].  He denied suicidal or homicidal ideation.  He is mildly tachycardic this morning with a rate of 101-112.  Blood pressure was between 154/70 and 133/78.  He is afebrile.  Pulse oximetry on room air was 100%.  He did sleep well last night.  Laboratories from 4/25 showed normal liver function enzymes with an AST of 17 and an ALT of 27.  CBC was completely normal with resolution of the elevated white blood cell count on admission.  Differential was normal.  Prolactin was 4.4.  Valproic acid level from 4/25 was 25.  TSH was normal at 0.318.  Principal Problem: Cannabis abuse with cannabis-induced disorder (HCC) Diagnosis: Principal Problem:   Cannabis abuse with cannabis-induced disorder (HCC)  Total Time spent with patient: 20 minutes  Past Psychiatric History:  See admission H&P  Past Medical History:  Past Medical History:  Diagnosis Date  . Eczema    History reviewed. No pertinent surgical history. Family History: History reviewed. No pertinent family history. Family Psychiatric  History: See admission H&P Social History:  Social History   Substance and Sexual Activity  Alcohol Use Yes  . Alcohol/week: 1.0 standard drink  . Types: 1 Cans of beer per week   Comment: twice/week     Social History   Substance and Sexual Activity  Drug Use Yes  . Types: Marijuana    Social History   Socioeconomic History  . Marital status: Single    Spouse name: Not on file  . Number of children: Not on file  . Years of education: Not on file  . Highest education level: Not on file  Occupational History  . Not on file  Tobacco Use  . Smoking status: Former Smoker    Packs/day: 1.00    Years: 5.00    Pack years: 5.00  . Smokeless tobacco: Former Clinical biochemist  . Vaping Use: Some days  . Substances: CBD  Substance and Sexual Activity  . Alcohol use: Yes    Alcohol/week: 1.0 standard drink    Types: 1 Cans of beer per week    Comment: twice/week  . Drug use: Yes    Types: Marijuana  . Sexual activity: Not Currently  Other Topics Concern  . Not on file  Social History Narrative  . Not on file  Social Determinants of Health   Financial Resource Strain: Not on file  Food Insecurity: Not on file  Transportation Needs: Not on file  Physical Activity: Not on file  Stress: Not on file  Social Connections: Not on file   Additional Social History:                         Sleep: Good  Appetite:  Fair  Current Medications: Current Facility-Administered Medications  Medication Dose Route Frequency Provider Last Rate Last Admin  . acetaminophen (TYLENOL) tablet 650 mg  650 mg Oral Q6H PRN Estella Husk, MD   650 mg at 06/18/20 2049  . alum & mag hydroxide-simeth (MAALOX/MYLANTA) 200-200-20 MG/5ML suspension 30  mL  30 mL Oral Q4H PRN Estella Husk, MD      . benztropine (COGENTIN) tablet 1 mg  1 mg Oral BID PRN Mariel Craft, MD      . divalproex (DEPAKOTE ER) 24 hr tablet 1,000 mg  1,000 mg Oral QHS Antonieta Pert, MD   1,000 mg at 06/18/20 2050  . loratadine (CLARITIN) tablet 10 mg  10 mg Oral Daily Mariel Craft, MD   10 mg at 06/19/20 0919  . magnesium hydroxide (MILK OF MAGNESIA) suspension 30 mL  30 mL Oral Daily PRN Estella Husk, MD      . metoprolol tartrate (LOPRESSOR) tablet 25 mg  25 mg Oral BID Antonieta Pert, MD   25 mg at 06/19/20 0920  . nicotine polacrilex (NICORETTE) gum 2 mg  2 mg Oral PRN White, Patrice L, NP   2 mg at 06/12/20 2114  . OLANZapine zydis (ZYPREXA) disintegrating tablet 10 mg  10 mg Oral Q8H PRN Estella Husk, MD   10 mg at 06/13/20 1719   And  . ziprasidone (GEODON) injection 20 mg  20 mg Intramuscular PRN Estella Husk, MD      . risperiDONE (RISPERDAL M-TABS) disintegrating tablet 2 mg  2 mg Oral Daily Antonieta Pert, MD   2 mg at 06/19/20 0920  . risperiDONE (RISPERDAL) tablet 4 mg  4 mg Oral QHS Mariel Craft, MD   4 mg at 06/18/20 2049  . traZODone (DESYREL) tablet 50 mg  50 mg Oral QHS,MR X 1 White, Patrice L, NP   50 mg at 06/18/20 2050    Lab Results: No results found for this or any previous visit (from the past 48 hour(s)).  Blood Alcohol level:  Lab Results  Component Value Date   ETH <10 06/09/2020   ETH <10 12/06/2018    Metabolic Disorder Labs: Lab Results  Component Value Date   HGBA1C 5.6 06/09/2020   MPG 114 06/09/2020   MPG 99.67 02/28/2017   Lab Results  Component Value Date   PROLACTIN 4.4 06/09/2020   Lab Results  Component Value Date   CHOL 142 06/09/2020   TRIG 51 06/09/2020   HDL 51 06/09/2020   CHOLHDL 2.8 06/09/2020   VLDL 10 06/09/2020   LDLCALC 81 06/09/2020   LDLCALC 98 02/28/2017    Physical Findings: AIMS: Facial and Oral Movements Muscles of Facial Expression:  None, normal Lips and Perioral Area: None, normal Jaw: None, normal Tongue: None, normal,Extremity Movements Upper (arms, wrists, hands, fingers): None, normal Lower (legs, knees, ankles, toes): None, normal, Trunk Movements Neck, shoulders, hips: None, normal, Overall Severity Severity of abnormal movements (highest score from questions above): None, normal Incapacitation due to abnormal movements: None,  normal Patient's awareness of abnormal movements (rate only patient's report): No Awareness, Dental Status Current problems with teeth and/or dentures?: No Does patient usually wear dentures?: No  CIWA:    COWS:     Musculoskeletal: Strength & Muscle Tone: within normal limits Gait & Station: normal Patient leans: N/A  Psychiatric Specialty Exam:  Presentation  General Appearance: Casual  Eye Contact:Fair  Speech:Normal Rate  Speech Volume:Normal  Handedness:Right   Mood and Affect  Mood:Labile  Affect:Labile   Thought Process  Thought Processes:Goal Directed  Descriptions of Associations:Tangential  Orientation:Full (Time, Place and Person)  Thought Content:Delusions  History of Schizophrenia/Schizoaffective disorder:No  Duration of Psychotic Symptoms:Less than six months  Hallucinations:Hallucinations: Auditory  Ideas of Reference:Delusions  Suicidal Thoughts:Suicidal Thoughts: No  Homicidal Thoughts:Homicidal Thoughts: No   Sensorium  Memory:Immediate Fair; Recent Fair; Remote Fair  Judgment:Impaired  Insight:Lacking   Executive Functions  Concentration:Fair  Attention Span:Fair  Recall:Fair  Fund of Knowledge:Fair  Language:Fair   Psychomotor Activity  Psychomotor Activity:Psychomotor Activity: Increased   Assets  Assets:Desire for Improvement; Resilience   Sleep  Sleep:Sleep: Good Number of Hours of Sleep: 7.75    Physical Exam: Physical Exam Vitals and nursing note reviewed.  Constitutional:      Appearance:  Normal appearance.  HENT:     Head: Normocephalic and atraumatic.  Pulmonary:     Effort: Pulmonary effort is normal.  Neurological:     General: No focal deficit present.     Mental Status: He is alert and oriented to person, place, and time.    ROS Blood pressure 133/78, pulse (!) 112, temperature 97.6 F (36.4 C), temperature source Oral, resp. rate 18, height 5\' 8"  (1.727 m), weight 56.7 kg, SpO2 100 %. Body mass index is 19.01 kg/m.   Treatment Plan Summary: Daily contact with patient to assess and evaluate symptoms and progress in treatment, Medication management and Plan : Patient is seen and examined.  Patient is a 20 year old male with the above-stated past psychiatric history who is seen in follow-up.   Diagnosis: 1.  Substance-induced mood disorder versus new onset bipolar disorder; most recently mixed with psychotic features 2.  Marijuana dependence 3.  Mildly elevated blood pressure  Pertinent findings on examination today: 1.  Patient remains labile, tearful, euphoric.  Plan: 1.  Continue Cogentin 1 mg p.o. twice daily as needed tremor. 2.  Continue Depakote ER 1000 mg p.o. every afternoon for mood stability. 3.  Continue Claritin 10 mg p.o. daily for seasonal allergies. 4.  Increase metoprolol to tartrate 50 mg p.o. twice daily for hypertension and tachycardia. 5.  Continue Risperdal 2 mg p.o. daily and 4 mg p.o. nightly for mood stability and psychosis. 6.  Continue trazodone 50 mg p.o. nightly as needed insomnia. 7.  Patient will need a CBC with differential, liver function enzymes and a Depakote level in 3 days. 8.  Disposition planning-in progress.  34, MD 06/19/2020, 11:18 AM

## 2020-06-19 NOTE — [facility] Group Notes (Signed)
LCSW Group Therapy Note   06/19/2020 1:15pm   Type of Therapy and Topic:  Group Therapy:  Positive Affirmations   Participation Level:  Active  Description of Group: This group addressed positive affirmation toward self and others. Patients went around the room and identified two positive things about themselves and two positive things about a peer in the room. Patients reflected on how it felt to share something positive with others, to identify positive things about themselves, and to hear positive things from others. Patients were encouraged to have a daily reflection of positive characteristics or circumstances.  Therapeutic Goals 1. Patient will verbalize two of their positive qualities 2. Patient will demonstrate empathy for others by stating two positive qualities about a peer in the group 3. Patient will verbalize their feelings when voicing positive self affirmations and when voicing positive affirmations of others 4. Patients will discuss the potential positive impact on their wellness/recovery of focusing on positive traits of self and others.  Summary of Patient Progress:  Pt stated that he is a gentleman and wants to become a good husband. Pt was observed to be interacting with peers appropriately throughout group.    Therapeutic Modalities Cognitive Behavioral Therapy Motivational Interviewing  Otelia Santee, LCSW 06/19/2020 2:16 PM

## 2020-06-19 NOTE — Progress Notes (Signed)
DAR Note: Patient calm and cooperative, denies SI/HI/AVH, taking all meds as scheduled. Pt reports a good appetite, a good sleep quality last night, and denies having any current concerns. Pt is being maintained on Q15 minute checks for safety, will continue to monitor.   06/19/20 1251  Psych Admission Type (Psych Patients Only)  Admission Status Involuntary  Psychosocial Assessment  Patient Complaints None  Eye Contact Fair  Facial Expression Animated  Affect Appropriate to circumstance  Speech Logical/coherent  Interaction Other (Comment) (appropriate)  Motor Activity Other (Comment) (steady gait)  Appearance/Hygiene Unremarkable  Behavior Characteristics Cooperative  Mood Pleasant  Thought Process  Coherency WDL  Content WDL  Delusions None reported or observed  Perception WDL  Hallucination None reported or observed  Judgment WDL  Confusion None  Danger to Self  Current suicidal ideation? Denies  Danger to Others  Danger to Others None reported or observed

## 2020-06-20 LAB — TSH: TSH: 1.385 u[IU]/mL (ref 0.350–4.500)

## 2020-06-20 LAB — T4, FREE: Free T4: 0.61 ng/dL (ref 0.61–1.12)

## 2020-06-20 NOTE — [facility] Group Notes (Signed)
[facility] Group Notes:  (Nursing/MHT/Case Management/Adjunct)  Date:  06/20/2020  Time:  9:53 AM  Type of Therapy:  Group Therapy  Participation Level:  Active  Participation Quality:  Appropriate  Affect:  Appropriate  Cognitive:  Alert  Insight:  Appropriate and Good  Engagement in Group:  Engaged  Modes of Intervention:  Orientation  Summary of Progress/Problems: His goal for today is to get more sleep to prepare to be discharged to the [facility].   Saher Davee J Taylynn Easton 06/20/2020, 9:53 AM

## 2020-06-20 NOTE — Plan of Care (Signed)
  Problem: Activity: Goal: Imbalance in normal sleep/wake cycle will improve Outcome: Progressing   Problem: Education: Goal: Knowledge of the prescribed therapeutic regimen will improve Outcome: Completed/Met   Problem: Activity: Goal: Interest or engagement in leisure activities will improve Outcome: Completed/Met

## 2020-06-20 NOTE — Progress Notes (Signed)
   06/19/20 2030  Psych Admission Type (Psych Patients Only)  Admission Status Involuntary  Psychosocial Assessment  Patient Complaints None  Eye Contact Fair  Facial Expression Animated  Affect Appropriate to circumstance  Speech Logical/coherent  Interaction Childlike  Motor Activity Other (Comment) (wdl)  Appearance/Hygiene Unremarkable  Behavior Characteristics Cooperative;Appropriate to situation  Mood Pleasant  Thought Process  Coherency WDL  Content WDL  Delusions None reported or observed  Perception WDL  Hallucination None reported or observed  Judgment WDL  Confusion None  Danger to Self  Current suicidal ideation? Denies  Danger to Others  Danger to Others None reported or observed  Sherryl Manges has been up and visible on the unit.  He interacts well with staff and peers.  He reported having a good day and is hoping to go home soon.  He took his hs medications without difficulty.  No complaints voiced.  He is currently resting with his eyes closed and appears to be asleep.  Q 15 minute checks maintained for safety.  We will continue to monitor the progress towards his goals.

## 2020-06-20 NOTE — [facility] Group Notes (Signed)
Occupational Therapy Group Note Date: 06/20/2020 Group Topic/Focus: Feelings Management  Group Description: Group encouraged increased engagement and participation through discussion focused on Building Happiness. Patients were encouraged to engage in discussion and share what happiness means to them and what brings them true feelings of happiness. Patients were also provided with a handout that offered six strategies to build/improve happiness including identifying gratitude, acts of kindness, exercise, meditation, fostering relationships, and positive journaling.  Participation Level: Active   Participation Quality: Independent   Behavior: Cooperative   Speech/Thought Process: Disorganized and Tangential   Affect/Mood: Full range   Insight: Limited   Judgement: Limited   Individualization: Jay Woods was active in their participation of group discussion/activity, however presents as disorganized and tangential at times. Pt identified "my mom and dad" as something that brings him happiness. Pt also shared something he is grateful for today "the girl who came in my room and truly listened to how I was feeling."  Modes of Intervention: Discussion, Education and Support  Patient Response to Interventions:  Attentive, Engaged and Interested   Plan: Continue to engage patient in OT groups 2 - 3x/week.  06/20/2020  Donne Hazel, MOT, OTR/L

## 2020-06-20 NOTE — Progress Notes (Signed)
Recreation Therapy Notes  Date: 4.28.22 Time: 1000 Location: 500 Hall Dayroom  Group Topic: Communication, Team Building, Problem Solving  Goal Area(s) Addresses:  Patient will effectively work with peer towards shared goal.  Patient will identify skills used to make activity successful.  Patient will identify how skills used during activity can be applied to reach post d/c goals.   Behavioral Response: Engaged  Intervention: STEM Activity- Glass blower/designer  Activity: Tallest [employer]. In teams of 5-6, patients were given 12 craft pipe cleaners. Using the materials provided, patients were instructed to compete again the opposing team(s) to build the tallest free-standing structure from floor level. The activity was timed; difficulty incrementally increased by Clinical research associate as Production designer, theatre/television/film continued. Additional directions given including, placing one arm behind their back, working in silence, and shape stipulations. LRT facilitated post-activity discussion reviewing team processes and necessary communication skills involved in completion. Patients were encouraged to reflect how the skills utilized, or not utilized, in this activity can be incorporated to positively impact support systems post discharge.  Education: Pharmacist, community, Scientist, physiological, Discharge Planning   Education Outcome: Acknowledges education/In group clarification offered/Needs additional education.   Clinical Observations/Feedback: Pt was a little late to group but joined right in with activity.  Pt was bright and worked well with peers.  Pt would slip up and talk or use both hands when he wasn't supposed to but would catch himself.  Pt was excited about what he and his peers were able to create.  Pt expressed enjoying the activity.  Pt left just before processing and returned just as group was finishing.    Caroll Rancher, LRT/CTRS    Caroll Rancher A 06/20/2020 10:44 AM

## 2020-06-21 LAB — CBC WITH DIFFERENTIAL/PLATELET
Abs Immature Granulocytes: 0.04 10*3/uL (ref 0.00–0.07)
Basophils Absolute: 0.1 10*3/uL (ref 0.0–0.1)
Basophils Relative: 1 %
Eosinophils Absolute: 0.1 10*3/uL (ref 0.0–0.5)
Eosinophils Relative: 1 %
HCT: 46.7 % (ref 39.0–52.0)
Hemoglobin: 15.6 g/dL (ref 13.0–17.0)
Immature Granulocytes: 1 %
Lymphocytes Relative: 31 %
Lymphs Abs: 2.3 10*3/uL (ref 0.7–4.0)
MCH: 31.3 pg (ref 26.0–34.0)
MCHC: 33.4 g/dL (ref 30.0–36.0)
MCV: 93.6 fL (ref 80.0–100.0)
Monocytes Absolute: 0.6 10*3/uL (ref 0.1–1.0)
Monocytes Relative: 7 %
Neutro Abs: 4.5 10*3/uL (ref 1.7–7.7)
Neutrophils Relative %: 59 %
Platelets: 289 10*3/uL (ref 150–400)
RBC: 4.99 MIL/uL (ref 4.22–5.81)
RDW: 13.8 % (ref 11.5–15.5)
WBC: 7.7 10*3/uL (ref 4.0–10.5)
nRBC: 0 % (ref 0.0–0.2)

## 2020-06-21 LAB — HEPATIC FUNCTION PANEL
ALT: 40 U/L (ref 0–44)
AST: 22 U/L (ref 15–41)
Albumin: 4.5 g/dL (ref 3.5–5.0)
Alkaline Phosphatase: 44 U/L (ref 38–126)
Bilirubin, Direct: <0.1 mg/dL (ref 0.0–0.2)
Total Bilirubin: 0.5 mg/dL (ref 0.3–1.2)
Total Protein: 7.8 g/dL (ref 6.5–8.1)

## 2020-06-21 LAB — VALPROIC ACID LEVEL: Valproic Acid Lvl: 58 ug/mL (ref 50.0–100.0)

## 2020-06-21 LAB — T3 UPTAKE: T3 Uptake Ratio: 31 % (ref 24–39)

## 2020-06-21 MED ORDER — DIVALPROEX SODIUM ER 500 MG PO TB24
1500.0000 mg | ORAL_TABLET | Freq: Every day | ORAL | Status: DC
  Administered 2020-06-21 – 2020-06-25 (×5): 1500 mg via ORAL
  Filled 2020-06-21 (×3): qty 3
  Filled 2020-06-21: qty 21
  Filled 2020-06-21 (×4): qty 3

## 2020-06-21 NOTE — [facility] Group Notes (Signed)
[facility] Group Notes:  (Nursing/MHT/Case Management/Adjunct)  Date:  06/21/2020  Time:  12:19 PM  Type of Therapy:  Group Therapy  Participation Level:  Active  Participation Quality:  Appropriate  Affect:  Appropriate  Cognitive:  Alert and Appropriate  Insight:  Appropriate and Good  Engagement in Group:  Engaged and Improving  Modes of Intervention:  Orientation  Summary of Progress/Problems: His goal to continue to be patient so that he can go with his family.   Jay Woods [name] 06/21/2020, 12:19 PM

## 2020-06-21 NOTE — Progress Notes (Signed)
Jay Woods was up and visible on the unit.  He was pleasant and cooperative.  He attended evening wrap up group and reported that he had a good day.  He denied SI/HI or AVH.  He did talk about being in "[place] and I am the character, Velma, without the glasses."  He talked about wanting to go home and live with his Dad in the [place].  He was happy that he talked to his mother and 22 year old brother today.  He reported pain in his right knee, tylenol given with good relief.  He took his bedtime medications without difficulty.  He did not require 2nd dose of trazodone as he appeared to be asleep.  Q 15 minute checks maintained for safety.  We will continue to monitor the progress towards his goals.   06/21/20 2100  Psych Admission Type (Psych Patients Only)  Admission Status Involuntary  Psychosocial Assessment  Patient Complaints None  Eye Contact Fair  Facial Expression Animated  Affect Appropriate to circumstance  Speech Logical/coherent  Interaction Needy  Motor Activity Fidgety  Appearance/Hygiene Unremarkable  Behavior Characteristics Cooperative;Appropriate to situation  Thought Process  Coherency WDL  Content Delusions  Delusions Grandeur  Perception WDL  Hallucination None reported or observed  Judgment WDL  Confusion None  Danger to Self  Current suicidal ideation? Denies  Danger to Others  Danger to Others None reported or observed

## 2020-06-21 NOTE — Progress Notes (Signed)
   06/20/20 2123  Psych Admission Type (Psych Patients Only)  Admission Status Involuntary  Psychosocial Assessment  Patient Complaints None  Eye Contact Fair  Facial Expression Animated  Affect Appropriate to circumstance  Speech Logical/coherent  Interaction Childlike  Motor Activity Fidgety  Appearance/Hygiene Unremarkable  Behavior Characteristics Appropriate to situation;Cooperative  Mood Pleasant  Thought Process  Coherency WDL  Content WDL  Delusions None reported or observed  Perception WDL  Hallucination None reported or observed  Judgment WDL  Confusion None  Danger to Self  Current suicidal ideation? Denies  Danger to Others  Danger to Others None reported or observed  Taheem has been very pleasant this evening.  He denied any SI/HI or A/V hallucinations.  He denied any physical complaints.  He took his bedtime medications without difficulty.  He did request the second dosage of his trazodone this evening.  He is currently resting with his eyes closed and appears to be asleep.  Q 15 minute checks maintained for safety.  We will continue to monitor the progress towards his goals.

## 2020-06-21 NOTE — Progress Notes (Signed)
   06/21/20 1700  Psych Admission Type (Psych Patients Only)  Admission Status Involuntary  Psychosocial Assessment  Patient Complaints None  Eye Contact Fair  Facial Expression Animated  Affect Appropriate to circumstance  Speech Logical/coherent  Interaction Childlike  Motor Activity Fidgety  Appearance/Hygiene Unremarkable  Behavior Characteristics Cooperative  Mood Pleasant  Thought Process  Coherency WDL  Content WDL  Delusions None reported or observed  Perception WDL  Hallucination None reported or observed  Judgment WDL  Confusion None  Danger to Self  Current suicidal ideation? Denies  Danger to Others  Danger to Others None reported or observed

## 2020-06-21 NOTE — Tx Team (Signed)
Interdisciplinary Treatment and Diagnostic Plan Update  06/21/2020 Time of Session: 9:40am [facility] Every MRN: [id number]  Principal Diagnosis: Schizophrenia spectrum disorder with psychotic disorder type not yet determined [facility])  Secondary Diagnoses: Principal Problem:   Schizophrenia spectrum disorder with psychotic disorder type not yet determined (HCC) Active Problems:   Marijuana abuse   Current Medications:  Current Facility-Administered Medications  Medication Dose Route Frequency Provider Last Rate Last Admin  . acetaminophen (TYLENOL) tablet 650 mg  650 mg Oral Q6H PRN Estella Husk, MD   650 mg at 06/21/20 0815  . alum & mag hydroxide-simeth (MAALOX/MYLANTA) 200-200-20 MG/5ML suspension 30 mL  30 mL Oral Q4H PRN Estella Husk, MD      . benztropine (COGENTIN) tablet 1 mg  1 mg Oral BID PRN Mariel Craft, MD      . divalproex (DEPAKOTE ER) 24 hr tablet 1,500 mg  1,500 mg Oral QHS Mason Jim, Amy E, MD      . loratadine (CLARITIN) tablet 10 mg  10 mg Oral Daily Mariel Craft, MD   10 mg at 06/21/20 0816  . magnesium hydroxide (MILK OF MAGNESIA) suspension 30 mL  30 mL Oral Daily PRN Estella Husk, MD      . metoprolol tartrate (LOPRESSOR) tablet 25 mg  25 mg Oral BID Antonieta Pert, MD   25 mg at 06/21/20 0816  . nicotine polacrilex (NICORETTE) gum 2 mg  2 mg Oral PRN Liborio Nixon L, NP   2 mg at 06/12/20 2114  . OLANZapine zydis (ZYPREXA) disintegrating tablet 10 mg  10 mg Oral Q8H PRN Estella Husk, MD   10 mg at 06/13/20 1719   And  . ziprasidone (GEODON) injection 20 mg  20 mg Intramuscular PRN Estella Husk, MD      . risperiDONE (RISPERDAL M-TABS) disintegrating tablet 2 mg  2 mg Oral Daily Antonieta Pert, MD   2 mg at 06/21/20 0816  . risperiDONE (RISPERDAL) tablet 4 mg  4 mg Oral QHS Mariel Craft, MD   4 mg at 06/20/20 2125  . traZODone (DESYREL) tablet 50 mg  50 mg Oral QHS,MR X 1 White, Patrice L, NP   50 mg at  06/20/20 2224   PTA Medications: No medications prior to admission.    Patient Stressors:    Patient Strengths:    Treatment Modalities: Medication Management, Group therapy, Case management,  1 to 1 session with clinician, Psychoeducation, Recreational therapy.   Physician Treatment Plan for Primary Diagnosis: Schizophrenia spectrum disorder with psychotic disorder type not yet determined (HCC) Long Term Goal(s): Improvement in symptoms so as ready for discharge Improvement in symptoms so as ready for discharge   Short Term Goals: Ability to identify changes in lifestyle to reduce recurrence of condition will improve Ability to demonstrate self-control will improve Ability to identify and develop effective coping behaviors will improve Ability to identify triggers associated with substance abuse/mental health issues will improve  Medication Management: Evaluate patient's response, side effects, and tolerance of medication regimen.  Therapeutic Interventions: 1 to 1 sessions, Unit Group sessions and Medication administration.  Evaluation of Outcomes: Progressing  Physician Treatment Plan for Secondary Diagnosis: Principal Problem:   Schizophrenia spectrum disorder with psychotic disorder type not yet determined (HCC) Active Problems:   Marijuana abuse  Long Term Goal(s): Improvement in symptoms so as ready for discharge Improvement in symptoms so as ready for discharge   Short Term Goals: Ability to identify changes in lifestyle to reduce  recurrence of condition will improve Ability to demonstrate self-control will improve Ability to identify and develop effective coping behaviors will improve Ability to identify triggers associated with substance abuse/mental health issues will improve     Medication Management: Evaluate patient's response, side effects, and tolerance of medication regimen.  Therapeutic Interventions: 1 to 1 sessions, Unit Group sessions and Medication  administration.  Evaluation of Outcomes: Progressing   RN Treatment Plan for Primary Diagnosis: Schizophrenia spectrum disorder with psychotic disorder type not yet determined (HCC) Long Term Goal(s): Knowledge of disease and therapeutic regimen to maintain health will improve  Short Term Goals: Ability to remain free from injury will improve, Ability to participate in decision making will improve, Ability to verbalize feelings will improve, Ability to disclose and discuss suicidal ideas and Ability to identify and develop effective coping behaviors will improve  Medication Management: RN will administer medications as ordered by provider, will assess and evaluate patient's response and provide education to patient for prescribed medication. RN will report any adverse and/or side effects to prescribing provider.  Therapeutic Interventions: 1 on 1 counseling sessions, Psychoeducation, Medication administration, Evaluate responses to treatment, Monitor vital signs and CBGs as ordered, Perform/monitor CIWA, COWS, AIMS and Fall Risk screenings as ordered, Perform wound care treatments as ordered.  Evaluation of Outcomes: Progressing   LCSW Treatment Plan for Primary Diagnosis: Schizophrenia spectrum disorder with psychotic disorder type not yet determined (HCC) Long Term Goal(s): Safe transition to appropriate next level of care at discharge, Engage patient in therapeutic group addressing interpersonal concerns.  Short Term Goals: Engage patient in aftercare planning with referrals and resources, Increase social support, Increase emotional regulation, Facilitate acceptance of mental health diagnosis and concerns, Identify triggers associated with mental health/substance abuse issues and Increase skills for wellness and recovery  Therapeutic Interventions: Assess for all discharge needs, 1 to 1 time with Social worker, Explore available resources and support systems, Assess for adequacy in community  support network, Educate family and significant other(s) on suicide prevention, Complete Psychosocial Assessment, Interpersonal group therapy.  Evaluation of Outcomes: Progressing   Progress in Treatment: Attending groups: Yes. Participating in groups: Yes. Taking medication as prescribed: Yes. Toleration medication: Yes. Family/Significant other contact made: Yes, individual(s) contacted:  Mother  Patient understands diagnosis: No. Discussing patient identified problems/goals with staff: Yes. Medical problems stabilized or resolved: Yes. Denies suicidal/homicidal ideation: Yes. Issues/concerns per patient self-inventory: No.   New problem(s) identified: No, Describe:  None   New Short Term/Long Term Goal(s): medication stabilization, elimination of SI thoughts, development of comprehensive mental wellness plan.   Patient Goals:  "To help myself and other people"  Discharge Plan or Barriers: Patient is to return home ad is to follow up with [facility] for therapy and medication management  Reason for Continuation of Hospitalization: Delusions  Hallucinations Medication stabilization  Estimated Length of Stay: 3 to 5 days   Attendees: Patient:  06/12/2020   Physician:  06/12/2020   Nursing:  06/12/2020   RN Care Manager: 06/12/2020   Social Worker: Ruthann Cancer, LCSW 06/12/2020   Recreational Therapist:  06/12/2020   Other:  06/12/2020   Other:  06/12/2020   Other: 06/12/2020     Scribe for Treatment Team: Felizardo Hoffmann, LCSWA 06/21/2020 2:33 PM

## 2020-06-21 NOTE — [facility] Group Notes (Signed)
SPIRITUALITY GROUP NOTE  Spirituality group facilitated by Wilkie Aye, MDiv, B[facility].  Group Description: Group focused on topic of hope. Patients participated in facilitated discussion around topic, connecting with one another around experiences and definitions for hope. Group members engaged with visual explorer photos, reflecting on what hope looks like for them today. Group engaged in discussion around how their definitions of hope are present today in hospital.  Modalities: Psycho-social ed, Adlerian, Narrative, MI  Patient Progress: Jay Woods attended and participated in group.  He was actively engaged in the conversation and did good self-reflection.

## 2020-06-21 NOTE — Progress Notes (Signed)
Recreation Therapy Notes  Date: 4.29.22 Time: 1000 Location: 500 Hall Dayroom  Group Topic: Decision Making, Problem Solving, Communication  Goal Area(s) Addresses:  Patient will effectively work with peer towards shared goal.  Patient will identify factors that guided their decision making.  Patient will pro-socially communicate ideas during group session.   Behavioral Response: Engaged  Intervention: Survival Scenario - pencil, paper  Activity:  Patients were given a scenario that they were going to be stranded on a deserted [place] for several months before being rescued. Writer tasked them with making a list of 15 things they would choose to bring with them for "survival". The list of items was prioritized most important to least. Each patient would come up with their own list, then work together to create a new list of 15 items while in a group of 3-5 peers. LRT discussed each person's list and how it differed from others. The debrief included discussion of priorities, good decisions versus bad decisions, and how it is important to think before acting so we can make the best decision possible. LRT tied the concept of effective communication among group members to patient's support systems outside of the hospital and its benefit post discharge.  Education: Pharmacist, community, Priorities, Support System, Discharge Planning    Education Outcome:   Acknowledges education/In group clarification/Needs additional education  Clinical Observations/Feedback: Pt came in late but was able to join a team and help them come up with a list.  Some of the things pt added to the list were strength bracelet, compass and machete.  Pt identified and negative/positive decision was tearing up in front of the doctor and feeling like the doctor didn't understand the importance of patients being around positive people while here in the hospital.       Caroll Rancher, LRT/CTRS    Caroll Rancher A 06/21/2020  11:54 AM

## 2020-06-21 NOTE — Plan of Care (Signed)
  Problem: Activity: Goal: Imbalance in normal sleep/wake cycle will improve Outcome: Progressing   Problem: Coping: Goal: Coping ability will improve Outcome: Progressing   Problem: Health Behavior/Discharge Planning: Goal: Compliance with therapeutic regimen will improve Outcome: Progressing   Problem: Activity: Goal: Will verbalize the importance of balancing activity with adequate rest periods Outcome: Progressing

## 2020-06-21 NOTE — [facility] Group Notes (Signed)
[facility] LCSW Group Therapy   06/21/2020 11:30 AM    Type of Therapy and Topic:  Group Therapy:  Strengths Exploration   Participation Level: None  Description of Group: This group allows individuals to explore their strengths, learn to use strengths in new ways to improve well-being. Strengths-based interventions involve identifying strengths, understanding how they are used, and learning new ways to apply them. Individuals will identify their strengths, and then explore their roles in different areas of life (relationships, professional life, and personal fulfillment). Individuals will think about ways in which they currently use their strengths, along with new ways they could begin using them.    Therapeutic Goals 1. Patient will verbalize two of their strengths 2. Patient will identify how their strengths are currently used 3. Patient will identify two new ways to apply their strengths  4. Patients will create a plan to apply their strengths in their daily lives     Summary of Patient Progress:  Pt declined to attend.       Therapeutic Modalities Cognitive Behavioral Therapy Motivational Interviewing   Ruthann Cancer MSW, LCSW Clincal Social Worker  [facility]

## 2020-06-21 NOTE — Progress Notes (Signed)
[facility] MD Progress Note  06/21/2020 7:27 AM Mathews Stuhr  MRN:  [id number]   Chief Complaint: Psychosis  Subjective: Jay Woods is a 20 y.o. male with a past psychiatric history significant for  IED, substance induced mood d/o, and psychosis, who was initially admitted for inpatient psychiatric hospitalization on 06/10/2020 for management of paranoia, hyper-religious thoughts, disorganized thinking, and aggressive behaviors. The patient is currently on Hospital Day 11.   Chart Review from last 24 hours:  The patient's chart was reviewed and nursing notes were reviewed. The patient's case was discussed in multidisciplinary team meeting. Per nursing, no behavioral issues or safety concerns noted but patient was somewhat disorganized and tangential in groups. Per [facility] he was compliant with scheduled medications and required no PRNS for anxiety or agitation. He did receive Trazodone X2 for sleep.  Information Obtained Today During Patient Interview: The patient was seen and evaluated on the unit. The patient reports that his mood is "good" and when asked what he feels is contributing to his sense of improvement, he states the "food is great and the groups are good." He reports feeling excited because his mother brought him some new clothes to wear on the unit. He denies medication side-effects, and I discussed plans to titrate up on his Depakote due to the fact that he appears to have some residual manic behaviors. He was in agreement with this plan. He denies AVH, ideas of reference, or first rank symptoms but with questioning derails into discussion about how he "likes to talk and sing out loud to himself" as a sense of comfort when he is alone. He describes this as "becoming a habit" after he was in prison. He then discusses how he has been reading his Bible and how he finds spiritual comfort from what he has been reading. When questioned about paranoia, he states he is worried about "guys"  in the community who "may not want to see me smile at girls" after discharge. He denies SI or HI. He endorses good sleep and appetite and voices no physical complaints.   Principal Problem: Schizophrenia spectrum disorder with psychotic disorder type not yet determined (HCC) Diagnosis: Principal Problem:   Schizophrenia spectrum disorder with psychotic disorder type not yet determined (HCC) Active Problems:   Marijuana abuse  Total Time Spent in Direct Patient Care:  I personally spent 30 minutes on the unit in direct patient care. The direct patient care time included face-to-face time with the patient, reviewing the patient's chart, communicating with other professionals, and coordinating care. Greater than 50% of this time was spent in counseling or coordinating care with the patient regarding goals of hospitalization, psycho-education, and discharge planning needs.  Past Psychiatric History: see admission H&P  Past Medical History:  Past Medical History:  Diagnosis Date  . Eczema    Family History: see admission H&P  Family Psychiatric  History: see admission H&P  Social History:  Social History   Substance and Sexual Activity  Alcohol Use Yes  . Alcohol/week: 1.0 standard drink  . Types: 1 Cans of beer per week   Comment: twice/week     Social History   Substance and Sexual Activity  Drug Use Yes  . Types: Marijuana    Social History   Socioeconomic History  . Marital status: Single    Spouse name: Not on file  . Number of children: Not on file  . Years of education: Not on file  . Highest education level: Not on file  Occupational History  .  Not on file  Tobacco Use  . Smoking status: Former Smoker    Packs/day: 1.00    Years: 5.00    Pack years: 5.00  . Smokeless tobacco: Former Clinical biochemistUser  Vaping Use  . Vaping Use: Some days  . Substances: CBD  Substance and Sexual Activity  . Alcohol use: Yes    Alcohol/week: 1.0 standard drink    Types: 1 Cans of beer  per week    Comment: twice/week  . Drug use: Yes    Types: Marijuana  . Sexual activity: Not Currently  Other Topics Concern  . Not on file  Social History Narrative  . Not on file   Social Determinants of Health   Financial Resource Strain: Not on file  Food Insecurity: Not on file  Transportation Needs: Not on file  Physical Activity: Not on file  Stress: Not on file  Social Connections: Not on file   Sleep: Good  Appetite:  Good  Current Medications: Current Facility-Administered Medications  Medication Dose Route Frequency Provider Last Rate Last Admin  . acetaminophen (TYLENOL) tablet 650 mg  650 mg Oral Q6H PRN Estella HuskLaubach, Katherine S, MD   650 mg at 06/20/20 1853  . alum & mag hydroxide-simeth (MAALOX/MYLANTA) 200-200-20 MG/5ML suspension 30 mL  30 mL Oral Q4H PRN Estella HuskLaubach, Katherine S, MD      . benztropine (COGENTIN) tablet 1 mg  1 mg Oral BID PRN Mariel CraftMaurer, Sheila M, MD      . divalproex (DEPAKOTE ER) 24 hr tablet 1,000 mg  1,000 mg Oral QHS Antonieta Pertlary, Greg Lawson, MD   1,000 mg at 06/20/20 2123  . loratadine (CLARITIN) tablet 10 mg  10 mg Oral Daily Mariel CraftMaurer, Sheila M, MD   10 mg at 06/20/20 0826  . magnesium hydroxide (MILK OF MAGNESIA) suspension 30 mL  30 mL Oral Daily PRN Estella HuskLaubach, Katherine S, MD      . metoprolol tartrate (LOPRESSOR) tablet 25 mg  25 mg Oral BID Antonieta Pertlary, Greg Lawson, MD   25 mg at 06/20/20 1853  . nicotine polacrilex (NICORETTE) gum 2 mg  2 mg Oral PRN White, Patrice L, NP   2 mg at 06/12/20 2114  . OLANZapine zydis (ZYPREXA) disintegrating tablet 10 mg  10 mg Oral Q8H PRN Estella HuskLaubach, Katherine S, MD   10 mg at 06/13/20 1719   And  . ziprasidone (GEODON) injection 20 mg  20 mg Intramuscular PRN Estella HuskLaubach, Katherine S, MD      . risperiDONE (RISPERDAL M-TABS) disintegrating tablet 2 mg  2 mg Oral Daily Antonieta Pertlary, Greg Lawson, MD   2 mg at 06/20/20 0825  . risperiDONE (RISPERDAL) tablet 4 mg  4 mg Oral QHS Mariel CraftMaurer, Sheila M, MD   4 mg at 06/20/20 2125  . traZODone  (DESYREL) tablet 50 mg  50 mg Oral QHS,MR X 1 White, Patrice L, NP   50 mg at 06/20/20 2224    Lab Results:  Results for orders placed or performed during the hospital encounter of 06/10/20 (from the past 48 hour(s))  T4, free     Status: None   Collection Time: 06/20/20  6:32 AM  Result Value Ref Range   Free T4 0.61 0.61 - 1.12 ng/dL    Comment: (NOTE) Biotin ingestion may interfere with free T4 tests. If the results are inconsistent with the TSH level, previous test results, or the clinical presentation, then consider biotin interference. If needed, order repeat testing after stopping biotin. Performed at [facility], [address], [place], [place] [address]   TSH     Status: None   Collection Time: 06/20/20  6:32 AM  Result Value Ref Range   TSH 1.385 0.350 - 4.500 uIU/mL    Comment: Performed by a 3rd Generation assay with a functional sensitivity of <=0.01 uIU/mL. Performed at [facility], [address], [place], [place] [address]     Blood Alcohol level:  Lab Results  Component Value Date   ETH <10 06/09/2020   ETH <10 12/06/2018    Metabolic Disorder Labs: Lab Results  Component Value Date   HGBA1C 5.6 06/09/2020   MPG 114 06/09/2020   MPG 99.67 02/28/2017   Lab Results  Component Value Date   PROLACTIN 4.4 06/09/2020   Lab Results  Component Value Date   CHOL 142 06/09/2020   TRIG 51 06/09/2020   HDL 51 06/09/2020   CHOLHDL 2.8 06/09/2020   VLDL 10 06/09/2020   LDLCALC 81 06/09/2020   LDLCALC 98 02/28/2017    Physical Findings: AIMS: Facial and Oral Movements Muscles of Facial Expression: None, normal Lips and Perioral Area: None, normal Jaw: None, normal Tongue: None, normal,Extremity Movements Upper (arms, wrists, hands, fingers): None, normal Lower (legs, knees, ankles, toes): None, normal, Trunk Movements Neck, shoulders, hips: None, normal, Overall Severity Severity of abnormal movements (highest score from  questions above): None, normal Incapacitation due to abnormal movements: None, normal Patient's awareness of abnormal movements (rate only patient's report): No Awareness, Dental Status Current problems with teeth and/or dentures?: No Does patient usually wear dentures?: No      Psychiatric Specialty Exam: Physical Exam Vitals reviewed.  HENT:     Head: Normocephalic.  Pulmonary:     Effort: Pulmonary effort is normal.  Neurological:     Mental Status: He is alert.     Review of Systems  Respiratory: Negative for shortness of breath.   Cardiovascular: Negative for chest pain.  Gastrointestinal: Negative for diarrhea, nausea and vomiting.    Blood pressure 107/83, pulse 87, temperature 97.7 F (36.5 C), temperature source Oral, resp. rate 16, height 5\' 8"  (1.727 m), weight 56.7 kg, SpO2 100 %.Body mass index is 19.01 kg/m.  General Appearance: casually dressed, adequate hygiene, appears stated age  Eye Contact:  Good  Speech:  Clear and Coherent and Normal Rate  Volume:  Normal  Mood:  mildly elevated  Affect:  Childlike at times, polite, mildly elevated  Thought Process:  tangential  Orientation:  Oriented to year, self, month, and city  Thought Content:  Denies AVH, ideas of reference, or first rank symptoms but admits to some paranoia - appears mildly grandiose and at time hyper-religious; is not grossly responding to internal/external stimuli on exam  Suicidal Thoughts:  No  Homicidal Thoughts:  No  Memory:  Recent;   Fair  Judgement:  Fair  Insight:  Lacking  Psychomotor Activity:  Normal, no cogwheeling, no stiffness, no tremor with AIMS 0  Concentration:  Concentration: Fair  Recall:  of Knowledge:  Fair  Language:  Good  Akathisia:  Negative  Assets:  Communication Skills Desire for Improvement Housing Resilience Social Support  ADL's:  Intact  Cognition:  WNL  Sleep:  Number of Hours: 6.75   Treatment Plan Summary: Diagnoses / Active  Problems: Unspecified schizophrenia spectrum and other psychotic d/o (r/o substance induced psychotic d/o, r/o bipolar I MRE manic with psychotic features, r/o schizoaffective d/o; r/o schizophrenia) Cannabis use d/o Elevated LFTs HTN and tachycardia Low TSH  PLAN: 1. Safety and Monitoring:  -- Involuntary admission to inpatient psychiatric unit for safety, stabilization and treatment  -- Daily contact with patient to assess and evaluate symptoms and progress in treatment  -- Patient's case to be discussed in multi-disciplinary team meeting  -- Observation Level : q15 minute checks  -- Vital signs:  q12 hours  -- Precautions: suicide  2. Psychiatric Diagnoses and Treatment:   Unspecified schizophrenia spectrum and other psychotic d/o (r/o substance induced psychotic d/o, r/o bipolar I MRE manic with psychotic features, r/o schizoaffective d/o; r/o schizophrenia) -- Increase to VPA ER 1500mg  qhs (06/21/20: VPA level 58; AST 22 and ALT 40 with remainder of hepatic function panel WNL; WBC 7.7, H/H 15.6/46.7 and platelets 289) -- Continue Risperdal M tabs 2mg  qam and 4mg  qhs - consider transition to Haldol or Zyprexa if he does not improve clinically with dose titration up on VPA ER -- Continue Trazodone 50mg  po qhs for insomnia -- Continue Cogentin 1mg  bid PRN EPS/tremors -- Continue Zyprexa zydis agitation protocol PRN  -- Metabolic profile and EKG monitoring obtained while on an atypical antipsychotic  Lipid Panel:cholesterol 142, triglycerides 51, HDL 51, LDL 81; HbgA1c:5.6; QTc:417ms)   -- Encouraged patient to participate in unit milieu and in scheduled group therapies   -- Short Term Goals: Ability to identify changes in lifestyle to reduce recurrence of condition will improve, Ability to demonstrate self-control will improve and Ability to identify and develop effective coping behaviors will improve  -- Long Term Goals: Improvement in symptoms so as ready for discharge   Cannabis use  d/o  -- UDS positive for THC  -- Counseled on the need to abstain from illicit substances after discharge  -- Short Term Goals: Ability to identify triggers associated with substance abuse/mental health issues will improve  -- Long Term Goals: Improvement in symptoms so as ready for discharge   3. Medical Issues Being Addressed:   High Risk Medication Use: -- The complexity of this patient's case involves drug therapy with Depakote which requires intensive monitoring for toxicity. This is in part due to a narrow therapeutic window as well as potential for toxicity with this medication.   Elevated LFTS on admission - resolved -- 06/21/20: AST 22 and ALT 40  Low TSH 0.318 on admission  -- Free T4 normal at 0.61 T3U pending and repeat TSH normalized to 1.385  -- will need PCP f/u after discharge for monitoring   Abnormal EKG - resolved  -- Repeat EKG 06/19/20 shows NSR 75bpm with QTc with no evidence of infarct or ischemic changes   HTN and transient tachycardia  -- Continue Lopressor 25mg  bid  4. Discharge Planning:   -- Social work and case management to assist with discharge planning and identification of hospital follow-up needs prior to discharge  -- Estimated LOS: 3-4 days  -- Discharge Concerns: Need to establish a safety plan; Medication compliance and effectiveness  -- Discharge Goals: Return home with outpatient referrals for mental health follow-up including medication management/psychotherapy  , MD, FAPA 06/21/2020, 7:27 AM

## 2020-06-22 NOTE — [facility] Group Notes (Signed)
[facility] Group Notes:  (Nursing/MHT/Case Management/Adjunct)  Date:  06/22/2020  Time:  2:45 PM  Type of Therapy:  Group Therapy  Participation Level:  Active  Participation Quality:  Appropriate  Affect:  Appropriate  Cognitive:  Appropriate  Insight:  Appropriate and Good  Engagement in Group:  Engaged and Improving  Modes of Intervention:  Orientation  Summary of Progress/Problems: His goal is to get medicine for his throat and continue to get better.   Shanekqua Schaper J Gayathri Futrell 06/22/2020, 2:45 PM

## 2020-06-22 NOTE — Progress Notes (Signed)
Pt said that he has an ankle monitor on for charges of robbery, possible assault with a gun, and possession of guns. Pt said that he was using marijuana before he came in and was going crazy for two girls. He said that he was after one girl because she "liked bad boys." Pt regrets his actions and was educated about staying away from drugs. Pt is tangential, but is very pleasant on the unit. He attended group and no disruptive behaviors have been observed. Pt denies SI/HI and AVH. Active listening, reassurance, and support provided. Q 15 min safety checks continue. Pt's safety has been maintained.   06/22/20 2036  Psych Admission Type (Psych Patients Only)  Admission Status Involuntary  Psychosocial Assessment  Patient Complaints Insomnia  Eye Contact Fair  Facial Expression Animated;Anxious  Affect Appropriate to circumstance;Euphoric  Speech Logical/coherent;Pressured  Interaction Assertive;Needy  Motor Activity Fidgety  Appearance/Hygiene Unremarkable  Behavior Characteristics Cooperative;Appropriate to situation;Anxious;Fidgety  Mood Pleasant  Thought Process  Coherency Tangential  Content Delusions;Religiosity  Delusions Grandeur  Perception WDL  Hallucination None reported or observed  Judgment Poor  Confusion None  Danger to Self  Current suicidal ideation? Denies  Danger to Others  Danger to Others None reported or observed

## 2020-06-22 NOTE — Progress Notes (Signed)
   06/22/20 1100  Psych Admission Type (Psych Patients Only)  Admission Status Involuntary  Psychosocial Assessment  Patient Complaints None  Eye Contact Fair  Facial Expression Animated  Affect Appropriate to circumstance  Speech Logical/coherent  Interaction Needy  Motor Activity Fidgety  Appearance/Hygiene Unremarkable  Behavior Characteristics Cooperative  Mood Pleasant  Thought Process  Coherency WDL  Content Delusions  Delusions Grandeur  Perception WDL  Hallucination None reported or observed  Judgment WDL  Confusion None  Danger to Self  Current suicidal ideation? Denies  Danger to Others  Danger to Others None reported or observed

## 2020-06-22 NOTE — Progress Notes (Signed)
Adult Psychoeducational Group Note  Date:  06/22/2020 Time:  10:39 PM  Group Topic/Focus:  Wrap-Up Group:   The focus of this group is to help patients review their daily goal of treatment and discuss progress on daily workbooks.  Participation Level:  Active  Participation Quality:  Appropriate  Affect:  Appropriate  Cognitive:  Disorganized  Insight: Appropriate  Engagement in Group:  Developing/Improving  Modes of Intervention:  Discussion  Additional Comments: Pt stated his goal for today was to focus on his treatment plan. Pt stated he accomplished his goal today. Pt stated he talked with his doctor but did not get a chance to talk with his social worker about his care today. Pt rated his overall day a 10. Pt statedhe was able to contact his friend, grandmother, mother today which improved his overall day.  Pt stated he attend all groups held today. Pt stated he felt better about himself today. Pt stated he was able to attend all meals. Pt stated he took all medications provided today. Pt stated his appetite was pretty good today. Pt rated sleep last night was pretty good. Pt stated the goal tonight was to get some rest. Pt stated he had no physical pain tonight. Pt deny visual hallucinationsand auditory issuestonight. Pt denies thoughts of harming himself or others. Pt stated he would alert staff if anything changed.   Felipa Furnace 06/22/2020, 10:39 PM

## 2020-06-22 NOTE — Progress Notes (Signed)
[facility] MD Progress Note  06/22/2020 6:00 PM Jay Woods  MRN:  [id number]   Chief Complaint: Psychosis  Subjective: Jay Woods is a 20 y.o. male with a past psychiatric history significant for  IED, substance induced mood d/o, and psychosis, who was initially admitted for inpatient psychiatric hospitalization on 06/10/2020 for management of paranoia, hyper-religious thoughts, disorganized thinking, and aggressive behaviors. The patient is currently on Hospital Day 12.   Chart Review from last 24 hours:  The patient's chart was reviewed and nursing notes were reviewed. The patient's case was discussed in multidisciplinary team meeting. Per nursing, no behavioral issues or safety concerns noted but patient was somewhat disorganized and tangential in groups. Per [facility] he was compliant with scheduled medications and required no PRNS for anxiety or agitation. He did receive Trazodone X1 for sleep.  Information Obtained Today During Patient Interview: The patient was seen and evaluated on the unit. The patient reports that his mood is "good" and when asked what he feels is contributing to his sense of improvement, he states the "food is great and the groups are good." He is smiling and happy today.  He denies medication side-effects. His Depakote was increased on 4/29 due to some residual manic behaviors. He is tolerating the increased dosage without issues.  He denies SI/HI/AVH, paranoia and delusions. He attended group yesterday and discussed being [place] with his Dad. He was happy that he talked with his mother and 10 year old brother. He then talked about being "[employer] and told the RN leading the group that she was the character Velma but without the glasses. He is calm and cooperative and pleasant on approach. He voices no physical complaints. Will continue to monitor for safety.   Principal Problem: Schizophrenia spectrum disorder with psychotic  disorder type not yet determined (HCC) Diagnosis: Principal Problem:   Schizophrenia spectrum disorder with psychotic disorder type not yet determined (HCC) Active Problems:   Marijuana abuse  Total Time Spent in Direct Patient Care:  I personally spent 30 minutes on the unit in direct patient care. The direct patient care time included face-to-face time with the patient, reviewing the patient's chart, communicating with other professionals, and coordinating care. Greater than 50% of this time was spent in counseling or coordinating care with the patient regarding goals of hospitalization, psycho-education, and discharge planning needs.  Past Psychiatric History: see admission H&P  Past Medical History:  Past Medical History:  Diagnosis Date  . Eczema    Family History: see admission H&P  Family Psychiatric  History: see admission H&P  Social History:  Social History   Substance and Sexual Activity  Alcohol Use Yes  . Alcohol/week: 1.0 standard drink  . Types: 1 Cans of beer per week   Comment: twice/week     Social History   Substance and Sexual Activity  Drug Use Yes  . Types: Marijuana    Social History   Socioeconomic History  . Marital status: Single    Spouse name: Not on file  . Number of children: Not on file  . Years of education: Not on file  . Highest education level: Not on file  Occupational History  . Not on file  Tobacco Use  . Smoking status: Former Smoker    Packs/day: 1.00    Years: 5.00    Pack years: 5.00  . Smokeless tobacco: Former Clinical biochemist  . Vaping Use: Some days  . Substances:  CBD  Substance and Sexual Activity  . Alcohol use: Yes    Alcohol/week: 1.0 standard drink    Types: 1 Cans of beer per week    Comment: twice/week  . Drug use: Yes    Types: Marijuana  . Sexual activity: Not Currently  Other Topics Concern  . Not on file  Social History Narrative  . Not on file   Social Determinants of Health   Financial  Resource Strain: Not on file  Food Insecurity: Not on file  Transportation Needs: Not on file  Physical Activity: Not on file  Stress: Not on file  Social Connections: Not on file   Sleep: Good  Appetite:  Good  Current Medications: Current Facility-Administered Medications  Medication Dose Route Frequency Provider Last Rate Last Admin  . acetaminophen (TYLENOL) tablet 650 mg  650 mg Oral Q6H PRN Estella Husk, MD   650 mg at 06/21/20 1950  . alum & mag hydroxide-simeth (MAALOX/MYLANTA) 200-200-20 MG/5ML suspension 30 mL  30 mL Oral Q4H PRN Estella Husk, MD      . benztropine (COGENTIN) tablet 1 mg  1 mg Oral BID PRN Mariel Craft, MD      . divalproex (DEPAKOTE ER) 24 hr tablet 1,500 mg  1,500 mg Oral QHS Mason Jim, Amy E, MD   1,500 mg at 06/21/20 2100  . loratadine (CLARITIN) tablet 10 mg  10 mg Oral Daily Mariel Craft, MD   10 mg at 06/22/20 1937  . magnesium hydroxide (MILK OF MAGNESIA) suspension 30 mL  30 mL Oral Daily PRN Estella Husk, MD      . metoprolol tartrate (LOPRESSOR) tablet 25 mg  25 mg Oral BID Antonieta Pert, MD   25 mg at 06/22/20 0829  . nicotine polacrilex (NICORETTE) gum 2 mg  2 mg Oral PRN White, Patrice L, NP   2 mg at 06/12/20 2114  . OLANZapine zydis (ZYPREXA) disintegrating tablet 10 mg  10 mg Oral Q8H PRN Estella Husk, MD   10 mg at 06/13/20 1719   And  . ziprasidone (GEODON) injection 20 mg  20 mg Intramuscular PRN Estella Husk, MD      . risperiDONE (RISPERDAL M-TABS) disintegrating tablet 2 mg  2 mg Oral Daily Antonieta Pert, MD   2 mg at 06/22/20 0829  . risperiDONE (RISPERDAL) tablet 4 mg  4 mg Oral QHS Mariel Craft, MD   4 mg at 06/21/20 2059  . traZODone (DESYREL) tablet 50 mg  50 mg Oral QHS,MR X 1 White, Patrice L, NP   50 mg at 06/21/20 2059    Lab Results:  Results for orders placed or performed during the hospital encounter of 06/10/20 (from the past 48 hour(s))  Hepatic function panel      Status: None   Collection Time: 06/21/20  7:07 AM  Result Value Ref Range   Total Protein 7.8 6.5 - 8.1 g/dL   Albumin 4.5 3.5 - 5.0 g/dL   AST 22 15 - 41 U/L   ALT 40 0 - 44 U/L   Alkaline Phosphatase 44 38 - 126 U/L   Total Bilirubin 0.5 0.3 - 1.2 mg/dL   Bilirubin, Direct <9.0 0.0 - 0.2 mg/dL   Indirect Bilirubin NOT CALCULATED 0.3 - 0.9 mg/dL    Comment: Performed at [facility], [address] [place], [place] [address]  Valproic acid level     Status: None   Collection Time: 06/21/20  7:07  AM  Result Value Ref Range   Valproic Acid Lvl 58 50.0 - 100.0 ug/mL    Comment: Performed at [facility], [address] [place], [place] [address]  CBC with Differential/Platelet     Status: None   Collection Time: 06/21/20  7:07 AM  Result Value Ref Range   WBC 7.7 4.0 - 10.5 K/uL   RBC 4.99 4.22 - 5.81 MIL/uL   Hemoglobin 15.6 13.0 - 17.0 g/dL   HCT 41.9 37.9 - 02.4 %   MCV 93.6 80.0 - 100.0 fL   MCH 31.3 26.0 - 34.0 pg   MCHC 33.4 30.0 - 36.0 g/dL   RDW 09.7 35.3 - 29.9 %   Platelets 289 150 - 400 K/uL   nRBC 0.0 0.0 - 0.2 %   Neutrophils Relative % 59 %   Neutro Abs 4.5 1.7 - 7.7 K/uL   Lymphocytes Relative 31 %   Lymphs Abs 2.3 0.7 - 4.0 K/uL   Monocytes Relative 7 %   Monocytes Absolute 0.6 0.1 - 1.0 K/uL   Eosinophils Relative 1 %   Eosinophils Absolute 0.1 0.0 - 0.5 K/uL   Basophils Relative 1 %   Basophils Absolute 0.1 0.0 - 0.1 K/uL   Immature Granulocytes 1 %   Abs Immature Granulocytes 0.04 0.00 - 0.07 K/uL    Comment: Performed at [facility] [address], [place], [place] [address]    Blood Alcohol level:  Lab Results  Component Value Date   ETH <10 06/09/2020   ETH <10 12/06/2018    Metabolic Disorder Labs: Lab Results  Component Value Date   HGBA1C 5.6 06/09/2020   MPG 114 06/09/2020   MPG 99.67 02/28/2017   Lab Results  Component Value Date   PROLACTIN 4.4 06/09/2020   Lab  Results  Component Value Date   CHOL 142 06/09/2020   TRIG 51 06/09/2020   HDL 51 06/09/2020   CHOLHDL 2.8 06/09/2020   VLDL 10 06/09/2020   LDLCALC 81 06/09/2020   LDLCALC 98 02/28/2017    Physical Findings: AIMS: Facial and Oral Movements Muscles of Facial Expression: None, normal Lips and Perioral Area: None, normal Jaw: None, normal Tongue: None, normal,Extremity Movements Upper (arms, wrists, hands, fingers): None, normal Lower (legs, knees, ankles, toes): None, normal, Trunk Movements Neck, shoulders, hips: None, normal, Overall Severity Severity of abnormal movements (highest score from questions above): None, normal Incapacitation due to abnormal movements: None, normal Patient's awareness of abnormal movements (rate only patient's report): No Awareness, Dental Status Current problems with teeth and/or dentures?: No Does patient usually wear dentures?: No      Psychiatric Specialty Exam: Physical Exam Vitals reviewed.  HENT:     Head: Normocephalic.  Pulmonary:     Effort: Pulmonary effort is normal.  Musculoskeletal:        General: Normal range of motion.     Cervical back: Normal range of motion.  Neurological:     Mental Status: He is alert and oriented to person, place, and time.  Psychiatric:        Attention and Perception: Attention normal. He does not perceive auditory or visual hallucinations.        Mood and Affect: Mood normal.        Speech: Speech normal.        Behavior: Behavior normal. Behavior is cooperative.        Thought Content: Thought content normal. Thought content is not paranoid or delusional. Thought content does  not include homicidal or suicidal ideation. Thought content does not include homicidal or suicidal plan.        Cognition and Memory: Cognition normal.     Review of Systems  Constitutional: Negative.   HENT: Negative.  Negative for congestion and sore throat.   Respiratory: Negative for shortness of breath.    Cardiovascular: Negative for chest pain.  Gastrointestinal: Negative for diarrhea, nausea and vomiting.  Genitourinary: Negative.   Musculoskeletal: Negative.   Neurological: Negative.     Blood pressure 119/77, pulse 80, temperature 97.8 F (36.6 C), temperature source Oral, resp. rate 18, height  (1.727 m), weight 56.7 kg, SpO2 99 %.Body mass index is 19.01 kg/m.  General Appearance: casually dressed, adequate hygiene, appears stated age  Eye Contact:  Good  Speech:  Clear and Coherent and Normal Rate  Volume:  Normal  Mood:  mildly elevated  Affect:  Childlike at times, polite, mildly elevated  Thought Process:  tangential  Orientation:  Oriented to year, self, month, and city  Thought Content:  Denies AVH, ideas of reference, or first rank symptoms but admits to some paranoia - appears mildly grandiose and at time hyper-religious; is not grossly responding to internal/external stimuli on exam  Suicidal Thoughts:  No  Homicidal Thoughts:  No  Memory:  Recent;   Fair  Judgement:  Fair  Insight:  Lacking  Psychomotor Activity:  Normal, no cogwheeling, no stiffness, no tremor with AIMS 0  Concentration:  Concentration: Fair  Recall:  [employer] of Knowledge:  Fair  Language:  Good  Akathisia:  Negative  Assets:  Communication Skills Desire for Improvement Housing Resilience Social Support  ADL's:  Intact  Cognition:  WNL  Sleep:  Number of Hours: 6.25   Treatment Plan Summary: Diagnoses / Active Problems: Unspecified schizophrenia spectrum and other psychotic d/o (r/o substance induced psychotic d/o, r/o bipolar I MRE manic with psychotic features, r/o schizoaffective d/o; r/o schizophrenia) Cannabis use d/o Elevated LFTs HTN and tachycardia Low TSH  PLAN: 1. Safety and Monitoring:  -- Involuntary admission to inpatient psychiatric unit for safety, stabilization and treatment  -- Daily contact with patient to assess and evaluate symptoms and progress in  treatment  -- Patient's case to be discussed in multi-disciplinary team meeting  -- Observation Level : q15 minute checks  -- Vital signs:  q12 hours  -- Precautions: suicide  2. Psychiatric Diagnoses and Treatment:   Unspecified schizophrenia spectrum and other psychotic d/o (r/o substance induced psychotic d/o, r/o bipolar I MRE manic with psychotic features, r/o schizoaffective d/o; r/o schizophrenia) -- Continue VPA ER  qhs (06/21/20: VPA level 58; AST 22 and ALT 40 with remainder of hepatic function panel WNL; WBC 7.7, H/H 15.6/46.7 and platelets 289) -- Continue Risperdal M tabs  qam and  qhs - consider transition to Haldol or Zyprexa if he does not improve clinically with dose titration up on VPA ER -- Continue Trazodone  po qhs for insomnia -- Continue Cogentin  bid PRN EPS/tremors -- Continue Zyprexa zydis agitation protocol PRN  -- Metabolic profile and EKG monitoring obtained while on an atypical antipsychotic  Lipid Panel:cholesterol 142, triglycerides 51, HDL 51, LDL 81; HbgA1c:5.6; QTc:453ms)   -- Encouraged patient to participate in unit milieu and in scheduled group therapies   -- Short Term Goals: Ability to identify changes in lifestyle to reduce recurrence of condition will improve, Ability to demonstrate self-control will improve and Ability to identify and develop effective coping behaviors will improve  --  Long Term Goals: Improvement in symptoms so as ready for discharge   Cannabis use d/o  -- UDS positive for THC  -- Counseled on the need to abstain from illicit substances after discharge  -- Short Term Goals: Ability to identify triggers associated with substance abuse/mental health issues will improve  -- Long Term Goals: Improvement in symptoms so as ready for discharge   3. Medical Issues Being Addressed:   High Risk Medication Use: -- The complexity of this patient's case involves drug therapy with Depakote which requires intensive monitoring  for toxicity. This is in part due to a narrow therapeutic window as well as potential for toxicity with this medication.   Elevated LFTS on admission - resolved -- 06/21/20: AST 22 and ALT 40  Low TSH 0.318 on admission  -- Free T4 normal at 0.61 T3U pending and repeat TSH normalized to 1.385  -- will need PCP f/u after discharge for monitoring   Abnormal EKG - resolved  -- Repeat EKG 06/19/20 shows NSR 75bpm with QTc 393ms with no evidence of infarct or ischemic changes   HTN and transient tachycardia  -- Continue Lopressor 25mg  bid  No new labs or medication changes today.   4. Discharge Planning:   -- Social work and case management to assist with discharge planning and identification of hospital follow-up needs prior to discharge  -- Estimated LOS: 3-4 days  -- Discharge Concerns: Need to establish a safety plan; Medication compliance and effectiveness  -- Discharge Goals: Return home with outpatient referrals for mental health follow-up including medication management/psychotherapy  Laveda AbbeLaurie Britton Quanita Barona, NP 06/22/2020, 6:00 PM

## 2020-06-22 NOTE — Progress Notes (Signed)
Adult Psychoeducational Group Note  Date:  06/22/2020 Time:  12:23 AM  Group Topic/Focus:  Wrap-Up Group:   The focus of this group is to help patients review their daily goal of treatment and discuss progress on daily workbooks.  Participation Level:  Active  Participation Quality:  Appropriate  Affect:  Appropriate  Cognitive:  Disorganized  Insight: Limited  Engagement in Group:  Limited  Modes of Intervention:  Discussion  Additional Comments:  Pt stated his goal for today was to focus on his treatment plan. Pt stated he accomplished his goal today. Pt stated he talked with his doctor but did not get a chance to talk with his social worker about his care today. Pt rated his overall day a 10. Pt stated he was able to contact his friend today which improved his overall day.  Pt stated he attend all groups held today. Pt stated he felt better about himself today. Pt stated he was able to attend all meals. Pt stated he took all medications provided today. Pt stated his appetite was pretty good today. Pt rated sleep last night was pretty good. Pt stated the goal tonight was to get some rest. Pt stated he had some physical pain today. Pt stated he had some mild pain in his right knee. Pt rated the mild pain in his right knee a 2 on the pain level rating scale. Pt nurse was updated on situation. Pt deny visual hallucinations and auditory issues tonight. Pt denies thoughts of harming himself or others. Pt stated he would alert staff if anything changed.   Felipa Furnace 06/22/2020, 12:23 AM

## 2020-06-22 NOTE — [facility] Group Notes (Signed)
.  Psychoeducational Group Note  Date: 06/22/2020 Time: 0900-1000    Goal Setting   Purpose of Group: This group helps to provide patients with the steps of setting a goal that is specific, measurable, attainable, realistic and time specific. A discussion on how we keep ourselves stuck with negative self talk.    Participation Level:  Did not come to group   Jay Woods A

## 2020-06-23 NOTE — Progress Notes (Signed)
[facility] MD Progress Note  06/23/2020 3:17 PM Jay Woods  MRN:  [id number]   Chief Complaint: Psychosis  Subjective: Jay Woods is a 20 y.o. male with a past psychiatric history significant for  IED, substance induced mood d/o, and psychosis, who was initially admitted for inpatient psychiatric hospitalization on 06/10/2020 for management of paranoia, hyper-religious thoughts, disorganized thinking, and aggressive behaviors. The patient is currently on Hospital Day 13.   Chart Review from last 24 hours:  The patient's chart was reviewed and nursing notes were reviewed. The patient's case was discussed in multidisciplinary team meeting. Per nursing, no behavioral issues or safety concerns noted but patient was somewhat disorganized and tangential in groups. Per [facility] he was compliant with scheduled medications and required no PRNS for anxiety or agitation.   Evaluation on the unit today: The patient was seen and evaluated on the unit. The patient reports that his mood is "good." He reported good sleep and a good appetite.  He is smiling and happy today.  He denies medication side-effects.  He denies SI/HI/AVH, paranoia and delusions. He described his mood as "clumsy in a good way, happy and silly." He stated he has always been a silly person at heart and happy. He does not mention any religious beliefs and denies that he feels people are watching, following or targeting him. He is taking his medications without issues. He attended group today and stated they worked on having a positive mind set and that everyone is important. He is calm and cooperative and pleasant on approach. He voices no physical complaints. Will continue to monitor for safety. Encouragement and support provided.   Principal Problem: Schizophrenia spectrum disorder with psychotic disorder type not yet determined (HCC) Diagnosis: Principal Problem:   Schizophrenia spectrum disorder with psychotic disorder type not yet  determined (HCC) Active Problems:   Marijuana abuse  Total Time Spent in Direct Patient Care: 25 minutes   Past Psychiatric History: see admission H&P  Past Medical History:  Past Medical History:  Diagnosis Date  . Eczema    Family History: see admission H&P  Family Psychiatric  History: see admission H&P  Social History:  Social History   Substance and Sexual Activity  Alcohol Use Yes  . Alcohol/week: 1.0 standard drink  . Types: 1 Cans of beer per week   Comment: twice/week     Social History   Substance and Sexual Activity  Drug Use Yes  . Types: Marijuana    Social History   Socioeconomic History  . Marital status: Single    Spouse name: Not on file  . Number of children: Not on file  . Years of education: Not on file  . Highest education level: Not on file  Occupational History  . Not on file  Tobacco Use  . Smoking status: Former Smoker    Packs/day: 1.00    Years: 5.00    Pack years: 5.00  . Smokeless tobacco: Former Clinical biochemist  . Vaping Use: Some days  . Substances: CBD  Substance and Sexual Activity  . Alcohol use: Yes    Alcohol/week: 1.0 standard drink    Types: 1 Cans of beer per week    Comment: twice/week  . Drug use: Yes    Types: Marijuana  . Sexual activity: Not Currently  Other Topics Concern  . Not on file  Social History Narrative  . Not on file   Social Determinants of Health   Financial Resource Strain: Not on file  Food Insecurity: Not on file  Transportation Needs: Not on file  Physical Activity: Not on file  Stress: Not on file  Social Connections: Not on file   Sleep: Good  Appetite:  Good  Current Medications: Current Facility-Administered Medications  Medication Dose Route Frequency Provider Last Rate Last Admin  . acetaminophen (TYLENOL) tablet 650 mg  650 mg Oral Q6H PRN Estella Husk, MD   650 mg at 06/21/20 1950  . alum & mag hydroxide-simeth (MAALOX/MYLANTA) 200-200-20 MG/5ML suspension 30  mL  30 mL Oral Q4H PRN Estella Husk, MD      . benztropine (COGENTIN) tablet 1 mg  1 mg Oral BID PRN Mariel Craft, MD      . divalproex (DEPAKOTE ER) 24 hr tablet 1,500 mg  1,500 mg Oral QHS Bartholomew Crews E, MD   1,500 mg at 06/22/20 2037  . loratadine (CLARITIN) tablet 10 mg  10 mg Oral Daily Mariel Craft, MD   10 mg at 06/23/20 4193  . magnesium hydroxide (MILK OF MAGNESIA) suspension 30 mL  30 mL Oral Daily PRN Estella Husk, MD      . metoprolol tartrate (LOPRESSOR) tablet 25 mg  25 mg Oral BID Antonieta Pert, MD   25 mg at 06/23/20 7902  . nicotine polacrilex (NICORETTE) gum 2 mg  2 mg Oral PRN Liborio Nixon L, NP   2 mg at 06/12/20 2114  . OLANZapine zydis (ZYPREXA) disintegrating tablet 10 mg  10 mg Oral Q8H PRN Estella Husk, MD   10 mg at 06/13/20 1719   And  . ziprasidone (GEODON) injection 20 mg  20 mg Intramuscular PRN Estella Husk, MD      . risperiDONE (RISPERDAL M-TABS) disintegrating tablet 2 mg  2 mg Oral Daily Antonieta Pert, MD   2 mg at 06/23/20 4097  . risperiDONE (RISPERDAL) tablet 4 mg  4 mg Oral QHS Mariel Craft, MD   4 mg at 06/22/20 2039  . traZODone (DESYREL) tablet 50 mg  50 mg Oral QHS,MR X 1 White, Patrice L, NP   50 mg at 06/22/20 2037    Lab Results:  No results found for this or any previous visit (from the past 48 hour(s)).  Blood Alcohol level:  Lab Results  Component Value Date   ETH <10 06/09/2020   ETH <10 12/06/2018    Metabolic Disorder Labs: Lab Results  Component Value Date   HGBA1C 5.6 06/09/2020   MPG 114 06/09/2020   MPG 99.67 02/28/2017   Lab Results  Component Value Date   PROLACTIN 4.4 06/09/2020   Lab Results  Component Value Date   CHOL 142 06/09/2020   TRIG 51 06/09/2020   HDL 51 06/09/2020   CHOLHDL 2.8 06/09/2020   VLDL 10 06/09/2020   LDLCALC 81 06/09/2020   LDLCALC 98 02/28/2017    Physical Findings: AIMS: Facial and Oral Movements Muscles of Facial Expression:  None, normal Lips and Perioral Area: None, normal Jaw: None, normal Tongue: None, normal,Extremity Movements Upper (arms, wrists, hands, fingers): None, normal Lower (legs, knees, ankles, toes): None, normal, Trunk Movements Neck, shoulders, hips: None, normal, Overall Severity Severity of abnormal movements (highest score from questions above): None, normal Incapacitation due to abnormal movements: None, normal Patient's awareness of abnormal movements (rate only patient's report): No Awareness, Dental Status Current problems with teeth and/or dentures?: No Does patient usually wear dentures?: No      Psychiatric Specialty Exam: Physical Exam Vitals reviewed.  HENT:     Head: Normocephalic.  Pulmonary:     Effort: Pulmonary effort is normal.  Musculoskeletal:        General: Normal range of motion.     Cervical back: Normal range of motion.  Neurological:     Mental Status: He is alert and oriented to person, place, and time.  Psychiatric:        Attention and Perception: Attention normal. He does not perceive auditory or visual hallucinations.        Mood and Affect: Mood normal.        Speech: Speech normal.        Behavior: Behavior normal. Behavior is cooperative.        Thought Content: Thought content normal. Thought content is not paranoid or delusional. Thought content does not include homicidal or suicidal ideation. Thought content does not include homicidal or suicidal plan.        Cognition and Memory: Cognition normal.     Review of Systems  Constitutional: Negative.   HENT: Negative.  Negative for congestion and sore throat.   Respiratory: Negative for shortness of breath.   Cardiovascular: Negative for chest pain.  Gastrointestinal: Negative for diarrhea, nausea and vomiting.  Genitourinary: Negative.   Musculoskeletal: Negative.   Neurological: Negative.     Blood pressure (!) 141/63, pulse 83, temperature (!) 97.3 F (36.3 C), temperature source Oral,  resp. rate 18, height 5\' 8"  (1.727 m), weight 56.7 kg, SpO2 100 %.Body mass index is 19.01 kg/m.  General Appearance: casually dressed, adequate hygiene, appears stated age  Eye Contact:  Good  Speech:  Clear and Coherent and Normal Rate  Volume:  Normal  Mood:  mildly elevated  Affect:  Childlike at times, polite, mildly elevated  Thought Process:  tangential  Orientation:  Oriented to year, self, month, and city  Thought Content:  Denies AVH, ideas of reference, or first rank symptoms but admits to some paranoia - appears mildly grandiose and at time hyper-religious; is not grossly responding to internal/external stimuli on exam  Suicidal Thoughts:  No  Homicidal Thoughts:  No  Memory:  Recent;   Fair  Judgement:  Fair  Insight:  Lacking  Psychomotor Activity:  Normal, no cogwheeling, no stiffness, no tremor with AIMS 0  Concentration:  Concentration: Fair  Recall:  [employer]Fair  Fund of Knowledge:  Fair  Language:  Good  Akathisia:  Negative  Assets:  Communication Skills Desire for Improvement Housing Resilience Social Support  ADL's:  Intact  Cognition:  WNL  Sleep:  Number of Hours: 5.75   Treatment Plan Summary: Diagnoses / Active Problems: Unspecified schizophrenia spectrum and other psychotic d/o (r/o substance induced psychotic d/o, r/o bipolar I MRE manic with psychotic features, r/o schizoaffective d/o; r/o schizophrenia) Cannabis use d/o Elevated LFTs HTN and tachycardia Low TSH  PLAN: 1. Safety and Monitoring:  -- Involuntary admission to inpatient psychiatric unit for safety, stabilization and treatment  -- Daily contact with patient to assess and evaluate symptoms and progress in treatment  -- Patient's case to be discussed in multi-disciplinary team meeting  -- Observation Level : q15 minute checks  -- Vital signs:  q12 hours  -- Precautions: suicide  2. Psychiatric Diagnoses and Treatment:   Unspecified schizophrenia spectrum and other psychotic d/o (r/o  substance induced psychotic d/o, r/o bipolar I MRE manic with psychotic features, r/o schizoaffective d/o; r/o schizophrenia) -- Continue VPA ER 1500mg  qhs (06/21/20: VPA level 58; AST 22 and ALT 40 with remainder  of hepatic function panel WNL; WBC 7.7, H/H 15.6/46.7 and platelets 289) -- Continue Risperdal M tabs 2mg  qam and 4mg  qhs - consider transition to Haldol or Zyprexa if he does not improve clinically with dose titration up on VPA ER -- Continue Trazodone 50mg  po qhs for insomnia -- Continue Cogentin 1mg  bid PRN EPS/tremors -- Continue Zyprexa zydis agitation protocol PRN  -- Metabolic profile and EKG monitoring obtained while on an atypical antipsychotic  Lipid Panel:cholesterol 142, triglycerides 51, HDL 51, LDL 81; HbgA1c:5.6; QTc:451ms)   -- Encouraged patient to participate in unit milieu and in scheduled group therapies   -- Short Term Goals: Ability to identify changes in lifestyle to reduce recurrence of condition will improve, Ability to demonstrate self-control will improve and Ability to identify and develop effective coping behaviors will improve  -- Long Term Goals: Improvement in symptoms so as ready for discharge   Cannabis use d/o  -- UDS positive for THC  -- Counseled on the need to abstain from illicit substances after discharge  -- Short Term Goals: Ability to identify triggers associated with substance abuse/mental health issues will improve  -- Long Term Goals: Improvement in symptoms so as ready for discharge   3. Medical Issues Being Addressed:   High Risk Medication Use: -- The complexity of this patient's case involves drug therapy with Depakote which requires intensive monitoring for toxicity. This is in part due to a narrow therapeutic window as well as potential for toxicity with this medication.   Elevated LFTS on admission - resolved -- 06/21/20: AST 22 and ALT 40  Low TSH 0.318 on admission  -- Free T4 normal at 0.61 T3U pending and repeat TSH normalized  to 1.385  -- will need PCP f/u after discharge for monitoring   Abnormal EKG - resolved  -- Repeat EKG 06/19/20 shows NSR 75bpm with QTc with no evidence of infarct or ischemic changes   HTN and transient tachycardia  -- Continue Lopressor 25mg  bid  No new labs or medication changes today.   4. Discharge Planning:   -- Social work and case management to assist with discharge planning and identification of hospital follow-up needs prior to discharge  -- Estimated LOS: 3-4 days  -- Discharge Concerns: Need to establish a safety plan; Medication compliance and effectiveness  -- Discharge Goals: Return home with outpatient referrals for mental health follow-up including medication management/psychotherapy  4m, NP 06/23/2020, 3:17 PM

## 2020-06-23 NOTE — [facility] Group Notes (Signed)
Adult Psychoeducational Group Not Date:  06/23/2020 Time:  0900-1045 Group Topic/Focus: PROGRESSIVE RELAXATION. A group where deep breathing is taught and tensing and relaxation muscle groups is used. Imagery is used as well.  Pts are asked to imagine 3 pillars that hold them up when they are not able to hold themselves up.  Participation Level:  Active  Participation Quality:  Appropriate  Affect:  Appropriate  Cognitive:  Oriented  Insight: Improving  Engagement in Group:  Engaged  Modes of Intervention:  Activity, Discussion, Education, and Support  Additional Comments:  Pt rates his energy at a 10. States his pillars are Church, love and hope and family.  Dione Housekeeper

## 2020-06-23 NOTE — Psychosocial Assessment (Signed)
Clinical Social Work Note  CSW spoke with patient's father Evelene Croon [phone] to inform him that patient is going to be hospitalized a few more days.  He stated he is already on his way to this location (from the [employer]) and would like to see his son.  CSW informed him that no visitation is allowed at this time, but he can do a FaceTime visit.  He will call weekday CSW on Monday 5/2 to arrange this.  Ambrose Mantle, LCSW 06/23/2020, 10:43 AM

## 2020-06-23 NOTE — Progress Notes (Signed)
Adult Psychoeducational Group Note  Date:  06/23/2020 Time:  11:39 PM  Group Topic/Focus:  Wrap-Up Group:   The focus of this group is to help patients review their daily goal of treatment and discuss progress on daily workbooks.  Participation Level:  Active  Participation Quality:  Appropriate  Affect:  Anxious  Cognitive:  Disorganized  Insight: Appropriate  Engagement in Group:  Limited  Modes of Intervention:  Discussion  Additional Comments: Pt stated his goal for today was to focus on his treatment plan. Pt stated he accomplished his goal today. Pt stated he talked with his doctorand his social worker about his care today. Pt rated his overall day a 10. Pt statedhe was able to contact hisfriend,and his mothertoday which improved his overall day.Pt stated he attend all groups held today.Pt stated he felt better about himself today. Pt stated he was able to attend all meals. Pt stated he took all medications provided today. Pt stated his appetite was pretty good today. Pt rated sleep last night was pretty fair. Pt stated the goal tonight was to get some rest. Pt stated he hadno physical pain tonight. Pt deny visual hallucinationsand auditory issuestonight. Pt denies thoughts of harming himself or others. Pt stated he would alert staff if anything changed.   Felipa Furnace 06/23/2020, 11:39 PM

## 2020-06-23 NOTE — Progress Notes (Signed)
   06/23/20 0800  Psych Admission Type (Psych Patients Only)  Admission Status Involuntary  Psychosocial Assessment  Patient Complaints None  Eye Contact Fair  Facial Expression Animated;Anxious  Affect Appropriate to circumstance;Euphoric  Speech Logical/coherent;Pressured  Interaction Assertive;Needy  Motor Activity Fidgety  Appearance/Hygiene Unremarkable  Behavior Characteristics Cooperative;Appropriate to situation  Mood Pleasant  Thought Process  Coherency Tangential  Content Delusions  Delusions Grandeur  Perception WDL  Hallucination None reported or observed  Judgment Poor  Confusion None  Danger to Self  Current suicidal ideation? Denies  Danger to Others  Danger to Others None reported or observed  [place] NOVEL CORONAVIRUS (COVID-19) DAILY CHECK-OFF SYMPTOMS - answer yes or no to each - every day NO YES  Have you had a fever in the past 24 hours?  Fever (Temp > 37.80C / 100F) X   Have you had any of these symptoms in the past 24 hours? New Cough  Sore Throat   Shortness of Breath  Difficulty Breathing  Unexplained Body Aches   X   Have you had any one of these symptoms in the past 24 hours not related to allergies?   Runny Nose  Nasal Congestion  Sneezing   X   If you have had runny nose, nasal congestion, sneezing in the past 24 hours, has it worsened?  X   EXPOSURES - check yes or no X   Have you traveled outside the state in the past 14 days?  X   Have you been in contact with someone with a confirmed diagnosis of COVID-19 or PUI in the past 14 days without wearing appropriate PPE?  X   Have you been living in the same home as a person with confirmed diagnosis of COVID-19 or a PUI (household contact)?    X   Have you been diagnosed with COVID-19?    X              What to do next: Answered NO to all: Answered YES to anything:   Proceed with unit schedule Follow the [facility] Inpatient Flowsheet.

## 2020-06-24 NOTE — Progress Notes (Signed)
Recreation Therapy Notes  Date: 5.2.22 Time: 1020 Location: [address] Dayroom  Group Topic: Coping Skills  Goal Area(s) Addresses:  Patient will identify positive coping skills. Patient will identify benefit of using positive coping skills.  Behavioral Response: Engaged  Intervention: Mind Map, Pencils  Activity: Mind Map.  LRT and patients filled out the first eight boxes (jail, freedom, depression, being in a mental hospital, anxiety, trust, respect and hopelessness) together with instances in which coping skills would be used.  Pt would then get time to come up with coping skills for each instance identified.  Once pt finished, group would come back together and LRT would right coping skills on the board.  Pt would be able to fill in any blank spots on their paper if needed.  Education: Pharmacologist, Building control surveyor.   Education Outcome: Acknowledges understanding/In group clarification offered/Needs additional education.   Clinical Observations/Feedback: Pt was engaged and attentive.  Pt identified some coping skills as under/over eating, attending activities, rules, respect, prayer, watch movies, forgiveness, faith, self control, manners and loyalty.    Caroll Rancher, LRT/CTRS    Caroll Rancher A 06/24/2020 11:33 AM

## 2020-06-24 NOTE — Progress Notes (Signed)
[facility] MD Progress Note  06/24/2020 2:27 PM Jay Woods  MRN:  [id number]   Chief Complaint: Psychosis  Subjective: Jay Woods is a 20 y.o. male with a past psychiatric history significant for  IED, substance induced mood d/o, and psychosis, who was initially admitted for inpatient psychiatric hospitalization on 06/10/2020 for management of paranoia, hyper-religious thoughts, disorganized thinking, and aggressive behaviors. The patient is currently on Hospital Day 14.   Chart Review from last 24 hours:  The patient's chart was reviewed and nursing notes were reviewed. The patient's case was discussed in multidisciplinary team meeting. Per nursing, he has remained somewhat euphoric and needy with staff but no behavioral issues were noted. Per [facility] he has been compliant with scheduled medications and has not required PRNs.  Information Obtained Today During Patient Interview: The patient was seen and evaluated on the unit. Today he states he his mood is "good" and he denies feeling grandiose or having racing thoughts. He has been attending groups and reports good sleep and appetite. He voices no physical complaints or medication side-effects. He denies AVH, paranoia, ideas of reference, or first rank symptoms. Time was spent discussing plans to recheck his VPA level and that if he continues to improve discharge later this week.   Principal Problem: Schizophrenia spectrum disorder with psychotic disorder type not yet determined (HCC) Diagnosis: Principal Problem:   Schizophrenia spectrum disorder with psychotic disorder type not yet determined (HCC) Active Problems:   Marijuana abuse  Total Time Spent in Direct Patient Care:  I personally spent 30 minutes on the unit in direct patient care. The direct patient care time included face-to-face time with the patient, reviewing the patient's chart, communicating with other professionals, and coordinating care. Greater than 50% of this time  was spent in counseling or coordinating care with the patient regarding goals of hospitalization, psycho-education, and discharge planning needs.  Past Psychiatric History: see admission H&P  Past Medical History:  Past Medical History:  Diagnosis Date  . Eczema    Family History: see admission H&P  Family Psychiatric  History: see admission H&P  Social History:  Social History   Substance and Sexual Activity  Alcohol Use Yes  . Alcohol/week: 1.0 standard drink  . Types: 1 Cans of beer per week   Comment: twice/week     Social History   Substance and Sexual Activity  Drug Use Yes  . Types: Marijuana    Social History   Socioeconomic History  . Marital status: Single    Spouse name: Not on file  . Number of children: Not on file  . Years of education: Not on file  . Highest education level: Not on file  Occupational History  . Not on file  Tobacco Use  . Smoking status: Former Smoker    Packs/day: 1.00    Years: 5.00    Pack years: 5.00  . Smokeless tobacco: Former Clinical biochemist  . Vaping Use: Some days  . Substances: CBD  Substance and Sexual Activity  . Alcohol use: Yes    Alcohol/week: 1.0 standard drink    Types: 1 Cans of beer per week    Comment: twice/week  . Drug use: Yes    Types: Marijuana  . Sexual activity: Not Currently  Other Topics Concern  . Not on file  Social History Narrative  . Not on file   Social Determinants of Health   Financial Resource Strain: Not on file  Food Insecurity: Not on file  Transportation Needs:  Not on file  Physical Activity: Not on file  Stress: Not on file  Social Connections: Not on file   Sleep: Good  Appetite:  Good  Current Medications: Current Facility-Administered Medications  Medication Dose Route Frequency Provider Last Rate Last Admin  . acetaminophen (TYLENOL) tablet 650 mg  650 mg Oral Q6H PRN Estella HuskLaubach, Katherine S, MD   650 mg at 06/21/20 1950  . alum & mag hydroxide-simeth  (MAALOX/MYLANTA) 200-200-20 MG/5ML suspension 30 mL  30 mL Oral Q4H PRN Estella HuskLaubach, Katherine S, MD      . benztropine (COGENTIN) tablet 1 mg  1 mg Oral BID PRN Mariel CraftMaurer, Sheila M, MD      . divalproex (DEPAKOTE ER) 24 hr tablet 1,500 mg  1,500 mg Oral QHS Bartholomew CrewsSingleton, Brenan Modesto E, MD   1,500 mg at 06/23/20 2104  . loratadine (CLARITIN) tablet 10 mg  10 mg Oral Daily Mariel CraftMaurer, Sheila M, MD   10 mg at 06/24/20 [id number]  . magnesium hydroxide (MILK OF MAGNESIA) suspension 30 mL  30 mL Oral Daily PRN Estella HuskLaubach, Katherine S, MD      . metoprolol tartrate (LOPRESSOR) tablet 25 mg  25 mg Oral BID Antonieta Pertlary, Greg Lawson, MD   25 mg at 06/24/20 [id number]  . nicotine polacrilex (NICORETTE) gum 2 mg  2 mg Oral PRN Liborio NixonWhite, Patrice L, NP   2 mg at 06/12/20 2114  . OLANZapine zydis (ZYPREXA) disintegrating tablet 10 mg  10 mg Oral Q8H PRN Estella HuskLaubach, Katherine S, MD   10 mg at 06/13/20 1719   And  . ziprasidone (GEODON) injection 20 mg  20 mg Intramuscular PRN Estella HuskLaubach, Katherine S, MD      . risperiDONE (RISPERDAL M-TABS) disintegrating tablet 2 mg  2 mg Oral Daily Antonieta Pertlary, Greg Lawson, MD   2 mg at 06/24/20 [id number]  . risperiDONE (RISPERDAL) tablet 4 mg  4 mg Oral QHS Mariel CraftMaurer, Sheila M, MD   4 mg at 06/23/20 2104  . traZODone (DESYREL) tablet 50 mg  50 mg Oral QHS,MR X 1 White, Patrice L, NP   50 mg at 06/23/20 2101    Lab Results:  No results found for this or any previous visit (from the past 48 hour(s)).  Blood Alcohol level:  Lab Results  Component Value Date   ETH <10 06/09/2020   ETH <10 12/06/2018    Metabolic Disorder Labs: Lab Results  Component Value Date   HGBA1C 5.6 06/09/2020   MPG 114 06/09/2020   MPG 99.67 02/28/2017   Lab Results  Component Value Date   PROLACTIN 4.4 06/09/2020   Lab Results  Component Value Date   CHOL 142 06/09/2020   TRIG 51 06/09/2020   HDL 51 06/09/2020   CHOLHDL 2.8 06/09/2020   VLDL 10 06/09/2020   LDLCALC 81 06/09/2020   LDLCALC 98 02/28/2017    Physical Findings: AIMS: Facial  and Oral Movements Muscles of Facial Expression: None, normal Lips and Perioral Area: None, normal Jaw: None, normal Tongue: None, normal,Extremity Movements Upper (arms, wrists, hands, fingers): None, normal Lower (legs, knees, ankles, toes): None, normal, Trunk Movements Neck, shoulders, hips: None, normal, Overall Severity Severity of abnormal movements (highest score from questions above): None, normal Incapacitation due to abnormal movements: None, normal Patient's awareness of abnormal movements (rate only patient's report): No Awareness, Dental Status Current problems with teeth and/or dentures?: No Does patient usually wear dentures?: No      Psychiatric Specialty Exam: Physical Exam Vitals reviewed.  HENT:     Head: Normocephalic.  Pulmonary:     Effort: Pulmonary effort is normal.  Neurological:     Mental Status: He is alert.     Review of Systems  Respiratory: Negative for shortness of breath.   Cardiovascular: Negative for chest pain.  Gastrointestinal: Negative for diarrhea, nausea and vomiting.    Blood pressure 135/72, pulse 90, temperature 97.6 F (36.4 C), temperature source Oral, resp. rate 18, height 5\' 8"  (1.727 m), weight 56.7 kg, SpO2 99 %.Body mass index is 19.01 kg/m.  General Appearance: casually dressed, adequate hygiene, appears stated age  Eye Contact:  Good  Speech:  Clear and Coherent and Normal Rate  Volume:  Normal  Mood:  mildly elevated  Affect:  Childlike at times, polite, mildly elevated  Thought Process: more goal directed and linear today  Orientation:  Oriented grossly to person, time, and place  Thought Content:  Denies AVH, ideas of reference, or first rank symptoms - does not make delusional or hyper-religious statements today and is not acutely responding to internal/external stimuli  Suicidal Thoughts:  No  Homicidal Thoughts:  No  Memory:  Recent;   Fair  Judgement:  Fair  Insight:  Improving  Psychomotor Activity:  Normal   Concentration:  Concentration: Fair  Recall:  of Knowledge:  Fair  Language:  Good  Akathisia:  Negative  Assets:  Communication Skills Desire for Improvement Housing Resilience Social Support  ADL's:  Intact  Cognition:  WNL  Sleep:  Number of Hours: 6.75   Treatment Plan Summary: Diagnoses / Active Problems: Unspecified schizophrenia spectrum and other psychotic d/o (r/o substance induced psychotic d/o, r/o bipolar I MRE manic with psychotic features, r/o schizoaffective d/o; r/o schizophrenia) Cannabis use d/o Elevated LFTs HTN and tachycardia Low TSH  PLAN: 1. Safety and Monitoring:  -- Involuntary admission to inpatient psychiatric unit for safety, stabilization and treatment  -- Daily contact with patient to assess and evaluate symptoms and progress in treatment  -- Patient's case to be discussed in multi-disciplinary team meeting  -- Observation Level : q15 minute checks  -- Vital signs:  q12 hours  -- Precautions: suicide  2. Psychiatric Diagnoses and Treatment:   Unspecified schizophrenia spectrum and other psychotic d/o (r/o substance induced psychotic d/o, r/o bipolar I MRE manic with psychotic features, r/o schizoaffective d/o; r/o schizophrenia) -- Continue VPA ER 1500mg  qhs - rechecking VPA level, CBC and LFTs tomorrow -- Continue Risperdal M tabs 2mg  qam and 4mg  qhs -- Continue Trazodone 50mg  po qhs for insomnia -- Continue Cogentin 1mg  bid PRN EPS/tremors -- Continue Zyprexa zydis agitation protocol PRN  -- Metabolic profile and EKG monitoring obtained while on an atypical antipsychotic  Lipid Panel:cholesterol 142, triglycerides 51, HDL 51, LDL 81; HbgA1c:5.6; QTc:461ms)   -- Encouraged patient to participate in unit milieu and in scheduled group therapies   -- Short Term Goals: Ability to identify changes in lifestyle to reduce recurrence of condition will improve, Ability to demonstrate self-control will improve and Ability to identify and  develop effective coping behaviors will improve  -- Long Term Goals: Improvement in symptoms so as ready for discharge   Cannabis use d/o  -- UDS positive for THC  -- Counseled on the need to abstain from illicit substances after discharge  -- Short Term Goals: Ability to identify triggers associated with substance abuse/mental health issues will improve  -- Long Term Goals: Improvement in symptoms so as ready for discharge   3. Medical Issues Being Addressed:   High Risk Medication  Use: -- The complexity of this patient's case involves drug therapy with Depakote which requires intensive monitoring for toxicity. This is in part due to a narrow therapeutic window as well as potential for toxicity with this medication.   Elevated LFTS on admission - resolved -- 06/21/20: AST 22 and ALT 40  Low TSH 0.318 on admission - resolved -- Free T4 normal at 0.61, T3U normal at 31 and repeat TSH normalized to 1.385  -- will need PCP f/u after discharge for monitoring   Abnormal EKG - resolved  -- Repeat EKG 06/19/20 shows NSR 75bpm with QTc with no evidence of infarct or ischemic changes   HTN and transient tachycardia  -- Continue Lopressor 25mg  bid  4. Discharge Planning:   -- Social work and case management to assist with discharge planning and identification of hospital follow-up needs prior to discharge  -- Estimated LOS: 2-3 days  -- Discharge Concerns: Need to establish a safety plan; Medication compliance and effectiveness  -- Discharge Goals: Return home with outpatient referrals for mental health follow-up including medication management/psychotherapy  , MD, FAPA 06/24/2020, 2:27 PM

## 2020-06-24 NOTE — Plan of Care (Signed)
Cooperative and pleasant but experiencing thought blocking with mild confusion. Needy. Denies SI/HI. Stayed in the milieu until bedtime. Currently sleeping and safety precautions maintained.

## 2020-06-24 NOTE — Progress Notes (Signed)
Adult Psychoeducational Group Note  Date:  06/24/2020 Time:  9:51 PM  Group Topic/Focus:  Wrap-Up Group:   The focus of this group is to help patients review their daily goal of treatment and discuss progress on daily workbooks.  Participation Level:  Active  Participation Quality:  Appropriate  Affect:  Appropriate  Cognitive:  Appropriate  Insight: Appropriate  Engagement in Group:  Limited  Modes of Intervention:  Discussion  Additional Comments:  Pt stated his goal for today was to focus on his treatment plan. Pt stated he accomplished his goal today. Pt stated he talked with his doctorand hissocial worker about his care today. Pt rated his overall day a 10. Pt statedhe was able to contact hisfriend, grandmother, mothertoday which improved his overall day.Pt stated he attend all groups held today.Pt stated he felt better about himself today. Pt stated he was able to attend all meals. Pt stated he took all medications provided today. Pt stated his appetite was pretty good today. Pt rated sleep last night was pretty good. Pt stated the goal tonight was to get some rest. Pt stated he hadno physical pain tonight. Pt deny visual hallucinationsand auditory issuestonight. Pt denies thoughts of harming himself or others. Pt stated he would alert staff if anything changed.   Felipa Furnace 06/24/2020, 9:51 PM

## 2020-06-24 NOTE — BHH Counselor (Signed)
CSW was provided with this patients family's e-mail addresses in order to coordinate virtual visitation.   Father: [email or web address] Mother: [email or web address] Sister: [email or web address]     Ruthann Cancer MSW, LCSW Clincal Social Worker  [facility]

## 2020-06-24 NOTE — B[facility] Group Notes (Signed)
LCSW Group Therapy Notes  Type of Therapy and Topic: Group Therapy: Healthy Vs. Unhealthy Coping Strategies  Date and Time: 1:00PM  Participation Level: [facility] PARTICIPATION LEVEL: Active  Description of Group:  In this group, patients will be encouraged to explore their healthy and unhealthy coping strategics. Coping strategies are actions that we take to deal with stress, problems, or uncomfortable emotions in our daily lives. Each patient will be challenged to read some scenarios and discuss the unhealthy and healthy coping strategies within those scenarios. Also, each patient will be challenged to describe current healthy and unhealthy strategies that they use in their own lives and discuss the outcomes and barriers to those strategies. This group will be process-oriented, with patients participating in exploration of their own experiences as well as giving and receiving support and challenge from other group members.  Therapeutic Goals: 1. Patient will identify personal healthy and unhealthy coping strategies. 2. Patient will identify healthy and unhealthy coping strategies, in others, through scenarios.  3. Patient will identify expected outcomes of healthy and unhealthy coping strategies. 4. Patient will identify barriers to using healthy coping strategies.   Summary of Patient Progress: Patient attended and participated in group. Pt was seen to be playing basketball with peers and was interacting appropriately with peers. Pt shared that playing soccer is a coping skill he uses.    Therapeutic Modalities:  Cognitive Behavioral Therapy Solution Focused Therapy Motivational Interviewing   Ruthann Cancer MSW, LCSW Clincal Social Worker  [facility]

## 2020-06-24 NOTE — Progress Notes (Signed)
   06/24/20 1300  Psych Admission Type (Psych Patients Only)  Admission Status Involuntary  Psychosocial Assessment  Patient Complaints None  Eye Contact Fair  Facial Expression Animated;Anxious  Affect Appropriate to circumstance  Speech Logical/coherent  Interaction Assertive;Needy  Motor Activity Slow  Appearance/Hygiene Unremarkable  Behavior Characteristics Cooperative  Mood Pleasant  Thought Process  Coherency Tangential  Content Delusions  Delusions Grandeur  Perception Derealization  Hallucination None reported or observed  Judgment Impaired  Confusion None  Danger to Self  Current suicidal ideation? Denies  Danger to Others  Danger to Others None reported or observed

## 2020-06-25 LAB — CBC WITH DIFFERENTIAL/PLATELET
Abs Immature Granulocytes: 0.09 10*3/uL — ABNORMAL HIGH (ref 0.00–0.07)
Basophils Absolute: 0.1 10*3/uL (ref 0.0–0.1)
Basophils Relative: 1 %
Eosinophils Absolute: 0.2 10*3/uL (ref 0.0–0.5)
Eosinophils Relative: 2 %
HCT: 49.6 % (ref 39.0–52.0)
Hemoglobin: 16.3 g/dL (ref 13.0–17.0)
Immature Granulocytes: 1 %
Lymphocytes Relative: 35 %
Lymphs Abs: 2.9 10*3/uL (ref 0.7–4.0)
MCH: 31.4 pg (ref 26.0–34.0)
MCHC: 32.9 g/dL (ref 30.0–36.0)
MCV: 95.6 fL (ref 80.0–100.0)
Monocytes Absolute: 0.7 10*3/uL (ref 0.1–1.0)
Monocytes Relative: 9 %
Neutro Abs: 4.3 10*3/uL (ref 1.7–7.7)
Neutrophils Relative %: 52 %
Platelets: 277 10*3/uL (ref 150–400)
RBC: 5.19 MIL/uL (ref 4.22–5.81)
RDW: 13.8 % (ref 11.5–15.5)
WBC: 8.3 10*3/uL (ref 4.0–10.5)
nRBC: 0 % (ref 0.0–0.2)

## 2020-06-25 LAB — HEPATIC FUNCTION PANEL
ALT: 37 U/L (ref 0–44)
AST: 22 U/L (ref 15–41)
Albumin: 4.4 g/dL (ref 3.5–5.0)
Alkaline Phosphatase: 41 U/L (ref 38–126)
Bilirubin, Direct: <0.1 mg/dL (ref 0.0–0.2)
Total Bilirubin: 0.6 mg/dL (ref 0.3–1.2)
Total Protein: 7.8 g/dL (ref 6.5–8.1)

## 2020-06-25 LAB — VALPROIC ACID LEVEL: Valproic Acid Lvl: 67 ug/mL (ref 50.0–100.0)

## 2020-06-25 NOTE — Progress Notes (Signed)
[facility] MD Progress Note  06/25/2020 12:31 PM Jay Woods  MRN:  [id number] Subjective:  "I am nervous and happy to see my family tomorrow."  Objective: Jay Woods is a 20 y.o. male with a past psychiatric history significant for  IED, substance induced mood d/o, and psychosis, who was initially admitted for inpatient psychiatric hospitalization on 06/10/2020 for management of paranoia, hyper-religious thoughts, disorganized thinking, and aggressive behaviors. The patient is currently on Hospital Day 14.   Patient was seen and evaluated on the unit today, chart reviewed and case discussed with the treatment team. Patient stated "I am nervous and happy to see my family tomorrow." Patient denies SI/HI/AVH, paranoia and delusions. He appears bright and is pleasant on approach. His mood is still slightly elevated but much better than when he was admitted. He is taking his medications as prescribed. His valproic acid level today is 67. He reported he slept well, chart shows he slept 5 hours. He reported a good appetite. He is attending group therapy and interacitng well with staff and peers. He maintains good eye contact, is neatly dressed and attending to his personal hygiene. He is [place]. He stated he is going to go fishing. He will likely be discharged tomorrow. Will continue to monitor for safety. Encouragement and support provided.   Principal Problem: Schizophrenia spectrum disorder with psychotic disorder type not yet determined (HCC) Diagnosis: Principal Problem:   Schizophrenia spectrum disorder with psychotic disorder type not yet determined (HCC) Active Problems:   Marijuana abuse  Total Time spent with patient: 25 minutes  Past Psychiatric History: See H&P  Past Medical History:  Past Medical History:  Diagnosis Date  . Eczema    History reviewed. No pertinent surgical history. Family History: History reviewed. No pertinent  family history. Family Psychiatric  History: See H&P Social History:  Social History   Substance and Sexual Activity  Alcohol Use Yes  . Alcohol/week: 1.0 standard drink  . Types: 1 Cans of beer per week   Comment: twice/week     Social History   Substance and Sexual Activity  Drug Use Yes  . Types: Marijuana    Social History   Socioeconomic History  . Marital status: Single    Spouse name: Not on file  . Number of children: Not on file  . Years of education: Not on file  . Highest education level: Not on file  Occupational History  . Not on file  Tobacco Use  . Smoking status: Former Smoker    Packs/day: 1.00    Years: 5.00    Pack years: 5.00  . Smokeless tobacco: Former Clinical biochemistUser  Vaping Use  . Vaping Use: Some days  . Substances: CBD  Substance and Sexual Activity  . Alcohol use: Yes    Alcohol/week: 1.0 standard drink    Types: 1 Cans of beer per week    Comment: twice/week  . Drug use: Yes    Types: Marijuana  . Sexual activity: Not Currently  Other Topics Concern  . Not on file  Social History Narrative  . Not on file   Social Determinants of Health   Financial Resource Strain: Not on file  Food Insecurity: Not on file  Transportation Needs: Not on file  Physical Activity: Not on file  Stress: Not on file  Social Connections: Not on file   Additional Social History:     Sleep: Good  Appetite:  Good  Current Medications:  Current Facility-Administered Medications  Medication Dose Route Frequency Provider Last Rate Last Admin  . acetaminophen (TYLENOL) tablet 650 mg  650 mg Oral Q6H PRN Estella Husk, MD   650 mg at 06/21/20 1950  . alum & mag hydroxide-simeth (MAALOX/MYLANTA) 200-200-20 MG/5ML suspension 30 mL  30 mL Oral Q4H PRN Estella Husk, MD      . benztropine (COGENTIN) tablet 1 mg  1 mg Oral BID PRN Mariel Craft, MD      . divalproex (DEPAKOTE ER) 24 hr tablet 1,500 mg  1,500 mg Oral QHS Mason Jim, Amy E, MD   1,500 mg  at 06/24/20 2135  . loratadine (CLARITIN) tablet 10 mg  10 mg Oral Daily Mariel Craft, MD   10 mg at 06/25/20 6440  . magnesium hydroxide (MILK OF MAGNESIA) suspension 30 mL  30 mL Oral Daily PRN Estella Husk, MD      . metoprolol tartrate (LOPRESSOR) tablet 25 mg  25 mg Oral BID Antonieta Pert, MD   25 mg at 06/25/20 3474  . nicotine polacrilex (NICORETTE) gum 2 mg  2 mg Oral PRN White, Patrice L, NP   2 mg at 06/12/20 2114  . OLANZapine zydis (ZYPREXA) disintegrating tablet 10 mg  10 mg Oral Q8H PRN Estella Husk, MD   10 mg at 06/13/20 1719   And  . ziprasidone (GEODON) injection 20 mg  20 mg Intramuscular PRN Estella Husk, MD      . risperiDONE (RISPERDAL M-TABS) disintegrating tablet 2 mg  2 mg Oral Daily Antonieta Pert, MD   2 mg at 06/25/20 2595  . risperiDONE (RISPERDAL) tablet 4 mg  4 mg Oral QHS Mariel Craft, MD   4 mg at 06/24/20 2137  . traZODone (DESYREL) tablet 50 mg  50 mg Oral QHS,MR X 1 White, Patrice L, NP   50 mg at 06/24/20 2135    Lab Results:  Results for orders placed or performed during the hospital encounter of 06/10/20 (from the past 48 hour(s))  Valproic acid level     Status: None   Collection Time: 06/25/20  6:26 AM  Result Value Ref Range   Valproic Acid Lvl 67 50.0 - 100.0 ug/mL    Comment: Performed at [facility], [address], [place], [place] [address]  CBC with Differential/Platelet     Status: Abnormal   Collection Time: 06/25/20  6:26 AM  Result Value Ref Range   WBC 8.3 4.0 - 10.5 K/uL   RBC 5.19 4.22 - 5.81 MIL/uL   Hemoglobin 16.3 13.0 - 17.0 g/dL   HCT 64.3 32.9 - 51.8 %   MCV 95.6 80.0 - 100.0 fL   MCH 31.4 26.0 - 34.0 pg   MCHC 32.9 30.0 - 36.0 g/dL   RDW 84.1 66.0 - 63.0 %   Platelets 277 150 - 400 K/uL   nRBC 0.0 0.0 - 0.2 %   Neutrophils Relative % 52 %   Neutro Abs 4.3 1.7 - 7.7 K/uL   Lymphocytes Relative 35 %   Lymphs Abs 2.9 0.7 - 4.0 K/uL   Monocytes Relative 9 %    Monocytes Absolute 0.7 0.1 - 1.0 K/uL   Eosinophils Relative 2 %   Eosinophils Absolute 0.2 0.0 - 0.5 K/uL   Basophils Relative 1 %   Basophils Absolute 0.1 0.0 - 0.1 K/uL   Immature Granulocytes 1 %   Abs Immature Granulocytes 0.09 (H) 0.00 - 0.07 K/uL  Comment: Performed at [facility], [address], [place], [place] [address]  Hepatic function panel     Status: None   Collection Time: 06/25/20  6:26 AM  Result Value Ref Range   Total Protein 7.8 6.5 - 8.1 g/dL   Albumin 4.4 3.5 - 5.0 g/dL   AST 22 15 - 41 U/L   ALT 37 0 - 44 U/L   Alkaline Phosphatase 41 38 - 126 U/L   Total Bilirubin 0.6 0.3 - 1.2 mg/dL   Bilirubin, Direct <9.5 0.0 - 0.2 mg/dL   Indirect Bilirubin NOT CALCULATED 0.3 - 0.9 mg/dL    Comment: Performed at [facility], [address], [place], [place] [address]    Blood Alcohol level:  Lab Results  Component Value Date   ETH <10 06/09/2020   ETH <10 12/06/2018    Metabolic Disorder Labs: Lab Results  Component Value Date   HGBA1C 5.6 06/09/2020   MPG 114 06/09/2020   MPG 99.67 02/28/2017   Lab Results  Component Value Date   PROLACTIN 4.4 06/09/2020   Lab Results  Component Value Date   CHOL 142 06/09/2020   TRIG 51 06/09/2020   HDL 51 06/09/2020   CHOLHDL 2.8 06/09/2020   VLDL 10 06/09/2020   LDLCALC 81 06/09/2020   LDLCALC 98 02/28/2017    Physical Findings: AIMS: Facial and Oral Movements Muscles of Facial Expression: None, normal Lips and Perioral Area: None, normal Jaw: None, normal Tongue: None, normal,Extremity Movements Upper (arms, wrists, hands, fingers): None, normal Lower (legs, knees, ankles, toes): None, normal, Trunk Movements Neck, shoulders, hips: None, normal, Overall Severity Severity of abnormal movements (highest score from questions above): None, normal Incapacitation due to abnormal movements: None, normal Patient's awareness of abnormal movements (rate only patient's  report): No Awareness, Dental Status Current problems with teeth and/or dentures?: No Does patient usually wear dentures?: No  CIWA:    COWS:     Musculoskeletal: Strength & Muscle Tone: within normal limits Gait & Station: normal Patient leans: N/A  Psychiatric Specialty Exam:  Presentation  General Appearance: Casual; Appropriate for Environment; Fairly Groomed  Eye Contact:Good  Speech:Normal Rate; Clear and Coherent  Speech Volume:Normal  Handedness:Right  Mood and Affect  Mood:Euthymic  Affect:Congruent  Thought Process  Thought Processes:Goal Directed; Coherent  Descriptions of Associations:Intact  Orientation:Full (Time, Place and Person)  Thought Content:Logical  History of Schizophrenia/Schizoaffective disorder:No  Duration of Psychotic Symptoms:Less than six months  Hallucinations:No data recorded Ideas of Reference:None  Suicidal Thoughts:No data recorded Homicidal Thoughts:No data recorded  Sensorium  Memory:Immediate Fair; Recent Fair; Remote Fair  Judgment:Poor  Insight:Lacking  Executive Functions  Concentration:Fair  Attention Span:Fair  Recall:Fair  Fund of Knowledge:Fair  Language:Fair  Psychomotor Activity  Psychomotor Activity:No data recorded  Assets  Assets:Communication Skills; Housing; Leisure Time; Physical Health; Resilience; Social Support; Financial Resources/Insurance  Sleep  Sleep:No data recorded  Physical Exam: Physical Exam Vitals and nursing note reviewed.  Constitutional:      Appearance: Normal appearance.  HENT:     Head: Normocephalic.  Pulmonary:     Effort: Pulmonary effort is normal.  Musculoskeletal:        General: Normal range of motion.     Cervical back: Normal range of motion.  Neurological:     Mental Status: He is alert and oriented to person, place, and time.  Psychiatric:        Attention and Perception: Attention normal. He does not perceive auditory or visual hallucinations.  Mood and Affect: Mood normal.        Speech: Speech normal.        Behavior: Behavior normal. Behavior is cooperative.        Thought Content: Thought content normal. Thought content is not paranoid or delusional. Thought content does not include homicidal or suicidal ideation. Thought content does not include homicidal or suicidal plan.        Cognition and Memory: Cognition normal.    Review of Systems  Constitutional: Negative for fever.  HENT: Negative for congestion and sore throat.   Respiratory: Negative for cough and shortness of breath.   Cardiovascular: Negative for chest pain.  Gastrointestinal: Negative.   Genitourinary: Negative.   Musculoskeletal: Negative.   Neurological: Negative.    Blood pressure 128/70, pulse 86, temperature 97.8 F (36.6 C), temperature source Oral, resp. rate 18, height 5\' 8"  (1.727 m), weight 56.7 kg, SpO2 99 %. Body mass index is 19.01 kg/m.   Treatment Plan Summary: Daily contact with patient to assess and evaluate symptoms and progress in treatment and Medication management   Schizophrenia spectrum disorder with psychotic disorder vs Drug induced psychotic disorder: -Continue Depakote ER 1500 mg QHS, follow up with outpatient PCP for periodic valproic acid level  to monitor for therapeutic dosage.  -Valproic Acid level on 5/3 is 67 -Continue Risperdal M tabs 2 mg AM and 4 mg QHS -Continue Cogentin 1 mg PO BID for EPS  Hypertension and tachycardia (transient): -Continue Lopressor 25 mg PO BID  Insomnia: -Continue Trazodone 50 mg PO PRN at bedtime  Discharge planning:  Social work and case management to assist with discharge planning and identification of hospital follow-up needs prior to discharge - Estimated discharge date is 06/26/2020 - Discharge Concerns: Safety concerns and medication compliance - Discharge Goals: Return home with outpatient referrals for mental health follow-up including medication management/psychotherapy.  Patient will live with his father at [place], [place].   [place], NP 06/25/2020, 12:31 PM

## 2020-06-25 NOTE — Progress Notes (Signed)
Imaad was in the day room much of the evening.  He interacted well with staff and peers.  He attended evening wrap up group with good participation.  He denied any SI/HI or AVH.  He made no delusional statements this evening.  He denied any pain or discomfort and appeared to be in no physical distress.  He reported he slept well last night.  He took bedtime medications without difficulty.  He is currently resting with his eyes closed and appears to be asleep.  Q 15 minute checks maintained for safety.  We will continue to monitor the progress towards his goals.   06/24/20 2135  Psych Admission Type (Psych Patients Only)  Admission Status Involuntary  Psychosocial Assessment  Patient Complaints None  Eye Contact Fair  Facial Expression Animated;Anxious  Affect Appropriate to circumstance  Speech Logical/coherent  Interaction Assertive;Needy;Childlike  Motor Activity Other (Comment) (wdl)  Appearance/Hygiene Unremarkable  Behavior Characteristics Cooperative;Appropriate to situation  Mood Pleasant  Thought Process  Coherency Tangential  Content WDL  Delusions WDL  Perception Derealization  Hallucination None reported or observed  Judgment Impaired  Confusion None  Danger to Self  Current suicidal ideation? Denies  Danger to Others  Danger to Others None reported or observed

## 2020-06-25 NOTE — [facility] Group Notes (Signed)
[facility] Group Notes:  (Nursing/MHT/Case Management/Adjunct)  Date:  06/25/2020  Time:  11:36 AM  Type of Therapy:  Group Therapy  Participation Level:  Active  Participation Quality:  Appropriate and Attentive  Affect:  Appropriate  Cognitive:  Appropriate  Insight:  Appropriate  Engagement in Group:  Engaged  Modes of Intervention:  Orientation  Summary of Progress/Problems: His goal for today is to continue to be patient so that he can go home.   Heli Dino J Shahab Polhamus 06/25/2020, 11:36 AM

## 2020-06-25 NOTE — Progress Notes (Signed)
   06/25/20 1200  Psych Admission Type (Psych Patients Only)  Admission Status Involuntary  Psychosocial Assessment  Patient Complaints None  Eye Contact Fair  Facial Expression Animated;Anxious  Affect Appropriate to circumstance  Speech Logical/coherent  Interaction Assertive;Needy;Childlike  Motor Activity Other (Comment) (wdl)  Appearance/Hygiene Unremarkable  Behavior Characteristics Cooperative  Mood Pleasant  Thought Process  Coherency Tangential  Content WDL  Delusions WDL  Perception Derealization  Hallucination None reported or observed  Judgment Impaired  Confusion None  Danger to Self  Current suicidal ideation? Denies  Danger to Others  Danger to Others None reported or observed

## 2020-06-25 NOTE — Progress Notes (Signed)
Adult Psychoeducational Group Note  Date:  06/25/2020 Time:  9:13 PM  Group Topic/Focus:  Wrap-Up Group:   The focus of this group is to help patients review their daily goal of treatment and discuss progress on daily workbooks.  Participation Level:  Active  Participation Quality:  Appropriate  Affect:  Appropriate  Cognitive:  Appropriate  Insight: Appropriate  Engagement in Group:  Developing/Improving  Modes of Intervention:  Discussion  Additional Comments:   Pt stated his goal for today was to focus on his treatment plan. Pt stated he accomplished his goal today. Pt stated he talked with his doctorand hissocial worker about his care today. PT stated discharge is set up for 06/26/2020. Pt rated his overall day a 10. Pt statedhe was able to contact hisfriend, grandmother, mothertoday which improved his overall day.Pt stated he attend all groups held today.Pt stated he felt better about himself today. Pt stated he was able to attend all meals. Pt stated he took all medications provided today. Pt stated his appetite was pretty good today. Pt rated sleep last night was pretty good. Pt stated the goal tonight was to get some rest. Pt stated he hadnophysical pain tonight. Pt deny visual hallucinationsand auditory issuestonight. Pt denies thoughts of harming himself or others. Pt stated he would alert staff if anything changed.  Felipa Furnace 06/25/2020, 9:13 PM

## 2020-06-25 NOTE — Progress Notes (Signed)
Jay Woods was pleasant this evening.  He denied SI/HI or AVH.  No delusional statements made this evening.  He attended evening wrap up group with good participation.  He is excited about his discharge tomorrow and [place]  He took his bedtime medications without difficulty.  He is currently resting with his eyes closed and appears to be asleep.  Q 15 minute checks maintained for safety.  We will continue to monitor the progress towards his goals.   06/25/20 2052  Psych Admission Type (Psych Patients Only)  Admission Status Involuntary  Psychosocial Assessment  Patient Complaints None  Eye Contact Fair  Facial Expression Animated;Anxious  Affect Appropriate to circumstance  Speech Logical/coherent  Interaction Assertive;Needy;Childlike  Motor Activity Other (Comment) (WDL)  Appearance/Hygiene Unremarkable  Behavior Characteristics Cooperative;Appropriate to situation  Mood Pleasant  Thought Process  Coherency Tangential  Content WDL  Delusions WDL  Perception Derealization  Hallucination None reported or observed  Judgment Impaired  Confusion None  Danger to Self  Current suicidal ideation? Denies  Danger to Others  Danger to Others None reported or observed

## 2020-06-25 NOTE — Progress Notes (Signed)
Recreation Therapy Notes  Date: 5.3.22 Time: 0945 Location: [address] Dayroom       Group Topic/Focus: Music Therapy  Goal Area(s) Addresses:  Patient will engage in pro-social way in music group.  Patient will demonstrate no behavioral issues during group.   Behavioral Response:  Engaged, Appropriate   Intervention: Music   Education: Leisure exposure, Coping skills, Musical expression, Discharge Planning  Education Outcome: Acknowledges Education/In group clarification  Clinical Observations/Feedback: Patient sat and listened as peer played the guitar for the group.  Patient would make up songs and freestyle while peer played the guitar.  Patient was bright and seemed to enjoy himself.  Patient expressed he got a lot of encouragement listening to his peers as they talked about using your gifts and making the most of every situation.    Caroll Rancher, LRT/CTRS      Lillia Abed, Janel Beane A 06/25/2020 11:02 AM

## 2020-06-25 NOTE — Plan of Care (Signed)
  Problem: Activity: Goal: Imbalance in normal sleep/wake cycle will improve Outcome: Progressing   Problem: Health Behavior/Discharge Planning: Goal: Compliance with therapeutic regimen will improve Outcome: Completed/Met   Problem: Education: Goal: Knowledge of the prescribed therapeutic regimen will improve Outcome: Completed/Met

## 2020-06-26 MED ORDER — LORATADINE 10 MG PO TABS: 10.0000 mg | ORAL_TABLET | Freq: Every day | ORAL | Status: DC

## 2020-06-26 MED ORDER — METOPROLOL TARTRATE 25 MG PO TABS: 25.0000 mg | ORAL_TABLET | Freq: Two times a day (BID) | ORAL | 0 refills | Status: DC

## 2020-06-26 MED ORDER — RISPERIDONE 2 MG PO TABS: ORAL_TABLET | ORAL | 0 refills | Status: DC

## 2020-06-26 MED ORDER — TRAZODONE HCL 50 MG PO TABS
50.0000 mg | ORAL_TABLET | Freq: Every evening | ORAL | Status: DC | PRN
  Filled 2020-06-26: qty 7

## 2020-06-26 MED ORDER — TRAZODONE HCL 50 MG PO TABS: 50.0000 mg | ORAL_TABLET | Freq: Every evening | ORAL | 0 refills | Status: DC | PRN

## 2020-06-26 MED ORDER — DIVALPROEX SODIUM ER 500 MG PO TB24: 1500.0000 mg | ORAL_TABLET | Freq: Every day | ORAL | 0 refills | Status: DC

## 2020-06-26 MED ORDER — NICOTINE POLACRILEX 2 MG MT GUM: 2.0000 mg | CHEWING_GUM | OROMUCOSAL | 0 refills | Status: DC | PRN

## 2020-06-26 MED ORDER — BENZTROPINE MESYLATE 1 MG PO TABS: 1.0000 mg | ORAL_TABLET | Freq: Two times a day (BID) | ORAL | 0 refills | Status: DC | PRN

## 2020-06-26 MED ORDER — RISPERIDONE 2 MG PO TABS
2.0000 mg | ORAL_TABLET | Freq: Every day | ORAL | Status: DC
  Filled 2020-06-26 (×2): qty 21

## 2020-06-26 NOTE — Plan of Care (Signed)
  Problem: Education: Goal: Will be free of psychotic symptoms Outcome: Progressing   Problem: Coping: Goal: Coping ability will improve Outcome: Progressing Goal: Will verbalize feelings Outcome: Progressing

## 2020-06-26 NOTE — Progress Notes (Signed)
  [facility] Adult Case Management Discharge Plan :  Will you be returning to the same living situation after discharge:  No. Will be staying with father At discharge, do you have transportation home?: Yes,  mother to pick this patient up Do you have the ability to pay for your medications: No. Samples to be provided at discharge  Release of information consent forms completed and in the chart;  Patient's signature needed at discharge.  Patient to Follow up at:  Follow-up Information    [facility]. Go to.   Why: Please go to this provider for therapy and medication management services during the provider's walk in hours for new patients: M & W, at 8:00 am. * Please arrive no later than 8 am.  Medication management will be scheduled after your first therapy appt. Contact information: [address], [address] [place], [place] [address]  Phone: [phone] Fax:  [phone]              Next level of care provider has access to [facility] Link:no  Safety Planning and Suicide Prevention discussed: Yes,  with mother and father     Has patient been referred to the Quitline?: Patient refused referral  Patient has been referred for addiction treatment: Pt. refused referral  Otelia [place], LCSW 06/26/2020, 8:59 AM

## 2020-06-26 NOTE — Tx Team (Signed)
Interdisciplinary Treatment and Diagnostic Plan Update  06/26/2020 Time of Session: 9:40am Jay Woods MRN: [id number]  Principal Diagnosis: Schizophrenia spectrum disorder with psychotic disorder type not yet determined [facility])  Secondary Diagnoses: Principal Problem:   Schizophrenia spectrum disorder with psychotic disorder type not yet determined (HCC) Active Problems:   Marijuana abuse   Current Medications:  Current Facility-Administered Medications  Medication Dose Route Frequency Provider Last Rate Last Admin  . acetaminophen (TYLENOL) tablet 650 mg  650 mg Oral Q6H PRN Estella Husk, MD   650 mg at 06/21/20 1950  . alum & mag hydroxide-simeth (MAALOX/MYLANTA) 200-200-20 MG/5ML suspension 30 mL  30 mL Oral Q4H PRN Estella Husk, MD      . benztropine (COGENTIN) tablet 1 mg  1 mg Oral BID PRN Mariel Craft, MD      . divalproex (DEPAKOTE ER) 24 hr tablet 1,500 mg  1,500 mg Oral QHS Bartholomew Crews E, MD   1,500 mg at 06/25/20 2052  . loratadine (CLARITIN) tablet 10 mg  10 mg Oral Daily Mariel Craft, MD   10 mg at 06/26/20 0804  . magnesium hydroxide (MILK OF MAGNESIA) suspension 30 mL  30 mL Oral Daily PRN Estella Husk, MD      . metoprolol tartrate (LOPRESSOR) tablet 25 mg  25 mg Oral BID Antonieta Pert, MD   25 mg at 06/26/20 0804  . nicotine polacrilex (NICORETTE) gum 2 mg  2 mg Oral PRN White, Patrice L, NP   2 mg at 06/12/20 2114  . OLANZapine zydis (ZYPREXA) disintegrating tablet 10 mg  10 mg Oral Q8H PRN Estella Husk, MD   10 mg at 06/13/20 1719   And  . ziprasidone (GEODON) injection 20 mg  20 mg Intramuscular PRN Estella Husk, MD      . Melene Muller ON 06/27/2020] risperiDONE (RISPERDAL) tablet 2 mg  2 mg Oral Daily Mason Jim, Amy E, MD      . risperiDONE (RISPERDAL) tablet 4 mg  4 mg Oral QHS Mariel Craft, MD   4 mg at 06/25/20 2052  . traZODone (DESYREL) tablet 50 mg  50 mg Oral QHS,MR X 1 White, Patrice L, NP   50 mg at  06/25/20 2052   PTA Medications: No medications prior to admission.    Patient Stressors:    Patient Strengths:    Treatment Modalities: Medication Management, Group therapy, Case management,  1 to 1 session with clinician, Psychoeducation, Recreational therapy.   Physician Treatment Plan for Primary Diagnosis: Schizophrenia spectrum disorder with psychotic disorder type not yet determined (HCC) Long Term Goal(s): Improvement in symptoms so as ready for discharge Improvement in symptoms so as ready for discharge   Short Term Goals: Ability to identify changes in lifestyle to reduce recurrence of condition will improve Ability to demonstrate self-control will improve Ability to identify and develop effective coping behaviors will improve Ability to identify triggers associated with substance abuse/mental health issues will improve  Medication Management: Evaluate patient's response, side effects, and tolerance of medication regimen.  Therapeutic Interventions: 1 to 1 sessions, Unit Group sessions and Medication administration.  Evaluation of Outcomes: Adequate for Discharge  Physician Treatment Plan for Secondary Diagnosis: Principal Problem:   Schizophrenia spectrum disorder with psychotic disorder type not yet determined (HCC) Active Problems:   Marijuana abuse  Long Term Goal(s): Improvement in symptoms so as ready for discharge Improvement in symptoms so as ready for discharge   Short Term Goals: Ability to identify changes in  lifestyle to reduce recurrence of condition will improve Ability to demonstrate self-control will improve Ability to identify and develop effective coping behaviors will improve Ability to identify triggers associated with substance abuse/mental health issues will improve     Medication Management: Evaluate patient's response, side effects, and tolerance of medication regimen.  Therapeutic Interventions: 1 to 1 sessions, Unit Group sessions and  Medication administration.  Evaluation of Outcomes: Adequate for Discharge   RN Treatment Plan for Primary Diagnosis: Schizophrenia spectrum disorder with psychotic disorder type not yet determined (HCC) Long Term Goal(s): Knowledge of disease and therapeutic regimen to maintain health will improve  Short Term Goals: Ability to remain free from injury will improve, Ability to participate in decision making will improve, Ability to verbalize feelings will improve, Ability to disclose and discuss suicidal ideas and Ability to identify and develop effective coping behaviors will improve  Medication Management: RN will administer medications as ordered by provider, will assess and evaluate patient's response and provide education to patient for prescribed medication. RN will report any adverse and/or side effects to prescribing provider.  Therapeutic Interventions: 1 on 1 counseling sessions, Psychoeducation, Medication administration, Evaluate responses to treatment, Monitor vital signs and CBGs as ordered, Perform/monitor CIWA, COWS, AIMS and Fall Risk screenings as ordered, Perform wound care treatments as ordered.  Evaluation of Outcomes: Adequate for Discharge   LCSW Treatment Plan for Primary Diagnosis: Schizophrenia spectrum disorder with psychotic disorder type not yet determined (HCC) Long Term Goal(s): Safe transition to appropriate next level of care at discharge, Engage patient in therapeutic group addressing interpersonal concerns.  Short Term Goals: Engage patient in aftercare planning with referrals and resources, Increase social support, Increase emotional regulation, Facilitate acceptance of mental health diagnosis and concerns, Identify triggers associated with mental health/substance abuse issues and Increase skills for wellness and recovery  Therapeutic Interventions: Assess for all discharge needs, 1 to 1 time with Social worker, Explore available resources and support systems,  Assess for adequacy in community support network, Educate family and significant other(s) on suicide prevention, Complete Psychosocial Assessment, Interpersonal group therapy.  Evaluation of Outcomes: Adequate for Discharge   Progress in Treatment: Attending groups: Yes. Participating in groups: Yes. Taking medication as prescribed: Yes. Toleration medication: Yes. Family/Significant other contact made: Yes, individual(s) contacted:  Mother  Patient understands diagnosis: No. Discussing patient identified problems/goals with staff: Yes. Medical problems stabilized or resolved: Yes. Denies suicidal/homicidal ideation: Yes. Issues/concerns per patient self-inventory: No.   New problem(s) identified: No, Describe:  None   New Short Term/Long Term Goal(s): medication stabilization, elimination of SI thoughts, development of comprehensive mental wellness plan.   Patient Goals:  "To help myself and other people"  Discharge Plan or Barriers: Patient is to return home ad is to follow up with [facility] for therapy and medication management  Reason for Continuation of Hospitalization: Delusions  Hallucinations Medication stabilization  Estimated Length of Stay: 3 to 5 days   Attendees: Patient:  06/12/2020   Physician:  06/12/2020   Nursing:  06/12/2020   RN Care Manager: 06/12/2020   Social Worker: Ruthann Cancer, LCSW 06/12/2020   Recreational Therapist:  06/12/2020   Other:  06/12/2020   Other:  06/12/2020   Other: 06/12/2020     Scribe for Treatment Team: Felizardo Hoffmann, LCSWA 06/26/2020 11:42 AM

## 2020-06-26 NOTE — [facility] Group Notes (Signed)
[facility] Group Notes:  (Nursing/MHT/Case Management/Adjunct)  Date:  06/26/2020  Time:  11:57 AM  Type of Therapy:  Group Therapy  Participation Level:  Active  Participation Quality:  Appropriate  Affect:  Appropriate  Cognitive:  Appropriate  Insight:  Appropriate  Engagement in Group:  Engaged  Modes of Intervention:  Orientation  Summary of Progress/Problems: His goal is to continue to be happy and to get discharged.   Diahn Waidelich J Gila Lauf 06/26/2020, 11:57 AM

## 2020-06-26 NOTE — Plan of Care (Signed)
  Problem: Activity: Goal: Imbalance in normal sleep/wake cycle will improve Outcome: Adequate for Discharge   Problem: Activity: Goal: Will verbalize the importance of balancing activity with adequate rest periods Outcome: Adequate for Discharge

## 2020-06-26 NOTE — [facility] Group Notes (Signed)
LCSW Group Therapy Note 06/26/2020 1:15pm  Type of Therapy and Topic:  Group Therapy:  Setting Goals  Participation Level:  Active  Description of Group: In this process group, patients discussed using strengths to work toward goals and address challenges.  Patients identified two positive things about themselves and one goal they were working on.  Patients were given the opportunity to share openly and support each other's plan for self-empowerment.  The group discussed the value of gratitude and were encouraged to have a daily reflection of positive characteristics or circumstances.  Patients were encouraged to identify a plan to utilize their strengths to work on current challenges and goals.  Therapeutic Goals 1. Patient will verbalize personal strengths/positive qualities and relate how these can assist with achieving desired personal goals 2. Patients will verbalize affirmation of peers plans for personal change and goal setting 3. Patients will explore the value of gratitude and positive focus as related to successful achievement of goals 4. Patients will verbalize a plan for regular reinforcement of personal positive qualities and circumstances.  Summary of Patient Progress: Patient came to group late, however  Was able to interact and participate in group.   Therapeutic Modalities Cognitive Behavioral Therapy Motivational Interviewing    Otelia Santee, LCSW 06/26/2020 1:32 PM

## 2020-06-26 NOTE — Progress Notes (Signed)
Recreation Therapy Notes  Date: 5.4.22 Time: 1000 Location: 500 [address] Dayroom   Group Topic: Communication, Team Building, Problem Solving  Goal Area(s) Addresses:  Patient will effectively work with peer towards shared goal.  Patient will identify skills used to make activity successful.  Patient will share challenges and verbalize solution-driven approaches used. Patient will identify how skills used during activity can be used to reach post d/c goals.   Behavioral Response: Engaged  Intervention: STEM Activity   Activity: [employer]. Patients were provided the following materials: 2 drinking straws, 5 rubber bands, 5 paper clips, 2 index cards and 2 drinking cups.  Using the provided materials patients were asked to build a launching mechanism to launch a ping pong ball across the room, approximately 10 feet. Patients were divided into teams of 3-5. Instructions required all materials be incorporated into the device, functionality of items left to the peer group's discretion.  Education: Pharmacist, community, Scientist, physiological, Air cabin crew, Building control surveyor.   Education Outcome: Acknowledges education/In group clarification offered/Needs additional education.   Clinical Observations/Feedback: Pt was appropriate and bright.  Pt worked well with peer.  Pt was also showing some frustration with the activity but was able to work through it.  Pt and peer were able to build a launcher that worked.   Caroll Rancher, LRT/CTRS    Caroll Rancher A 06/26/2020 11:50 AM

## 2020-06-26 NOTE — Discharge Summary (Signed)
Physician Discharge Summary Note  Patient:  Jay Woods is an 20 y.o., male MRN:  [id number] DOB:  09/03/2000 Patient phone:  [phone] (home)  Patient address:   [address] [place] [address],   Total Time spent with patient: Greater than 30 minutes  Date of Admission:  06/10/2020  Date of Discharge: 06-26-20  Reason for Admission: Bizarre behaviors.  Principal Problem: Schizophrenia spectrum disorder with psychotic disorder type not yet determined [facility])  Discharge Diagnoses: Principal Problem:   Schizophrenia spectrum disorder with psychotic disorder type not yet determined (HCC) Active Problems:   Marijuana abuse  Past Psychiatric History: Schizophrenia.  Past Medical History:  Past Medical History:  Diagnosis Date  . Eczema    History reviewed. No pertinent surgical history.  Family History: History reviewed. No pertinent family history.  Family Psychiatric  History: See H&P  Social History:  Social History   Substance and Sexual Activity  Alcohol Use Yes  . Alcohol/week: 1.0 standard drink  . Types: 1 Cans of beer per week   Comment: twice/week     Social History   Substance and Sexual Activity  Drug Use Yes  . Types: Marijuana    Social History   Socioeconomic History  . Marital status: Single    Spouse name: Not on file  . Number of children: Not on file  . Years of education: Not on file  . Highest education level: Not on file  Occupational History  . Not on file  Tobacco Use  . Smoking status: Former Smoker    Packs/day: 1.00    Years: 5.00    Pack years: 5.00  . Smokeless tobacco: Former Clinical biochemist  . Vaping Use: Some days  . Substances: CBD  Substance and Sexual Activity  . Alcohol use: Yes    Alcohol/week: 1.0 standard drink    Types: 1 Cans of beer per week    Comment: twice/week  . Drug use: Yes    Types: Marijuana  . Sexual activity: Not Currently  Other Topics Concern  . Not on file  Social  History Narrative  . Not on file   Social Determinants of Health   Financial Resource Strain: Not on file  Food Insecurity: Not on file  Transportation Needs: Not on file  Physical Activity: Not on file  Stress: Not on file  Social Connections: Not on file   Hospital Course: (Per Md's admission evaluation notes): Jay Woods is an 20 y.o. male who presents voluntarily to [facility] for assessment of mood and thought changes. Patient presents bizarre and pleasant; inconsistent historian. States he slept x 2 days ago, then states he slept this morning. Endorses poor appetite with recent weight loss; last ate yesterday. States he smokes CBD (store) and marijuana daily; last smoked Saturday (yesterday). Endorses psychiatric history of bipolar; currently not taking any medications or engaged in any outpatient treatment. Endorses poor sleep, lack of interest, poor appetite and concentration, inconsistent energy levels; unable to remain on topic during assessment. Patient appears highly distractible, increased indiscretion, grandiose, some flight of ideas, intermittent increased activity level, sleep deficit, and increased talkativeness. Patient is currently hyperfocused on talking about situations from time in jail; currently wearing ankle monitor for probation. Patient is denying any suicidal or homicidal thoughts, auditory or visual hallucinations; patient does appear to be possibly responding to  external/internal stimuli at this time. Patient does appear to be elevated and poor historian.  Prior to this discharge, Jay Woods was seen & evaluated for mental  health stability. The current laboratory findings were reviewed (stable), nurses notes & vital signs were reviewed as well. There are no current mental health or medical issues that should prevent this discharge at this time. Patient is being discharged to continue mental health on an outpatient basis as noted below.  This is one of several psychiatric  admissions/discharge summaries from this [facility] for this 20 year old Hispanic male with hx of chronic mental illness, cannabis use disorder & multiple psychiatric admissions. He is known in this [facility] for worsening symptoms of Schizophrenia. He has been tried on multiple psychotropic medications for his symptoms & it appeared his symptoms has not been able to improve & yet, Jay Woods is known to be non-compliant to his treatment regimen & also uses substances. He was brought to the [facility] this time around for evaluation & treatment for worsening bizarre behaviors.  After evaluation of his presenting symptoms, Jay Woods was recommended for mood stabilization treatments. The medication regimen for his presenting symptoms were discussed & with his consent initiated. He received, stabilized & was discharged on the medications as listed below on his discharge medication lists. He was also enrolled & participated some in the group counseling sessions being offered & held on this unit. He learned coping skills. He presented on this admission, other chronic medical conditions that required treatment & monitoring. He was resumed & discharged on his pertinent home medications for those health issues. He tolerated his treatment regimen without any adverse effects or reactions reported. It did take quite some time, however, his symptoms eventually responded well to his treatment regimen warranting this discharge.  During the course of his hospitalization, the 15-minute checks were adequate to ensure Jay Woods's safety.  Patient did not display any dangerous, violent or suicidal behavior on the unit. He interacted with patients & staff appropriately. He participated some in the group sessions/therapies. His medications were addressed & adjusted to meet his needs. He was recommended for outpatient follow-up care & medication management upon discharge to assure his continuity of care.  At the time of discharge, patient is not reporting any acute  suicidal/homicidal ideations. There no evidence of bizarre behaviors. He currently denies any new issues or concerns. Education and supportive counseling provided throughout her hospital stay & upon discharge.  Today upon his discharge evaluation with the attending psychiatrist, Jay Woods shares he is doing well. He denies any other specific concerns. He is sleeping well. His appetite is good. He denies other physical complaints. He denies AH/VH, delusional thoughts or paranoia. He feels that his medications have been helpful & is in agreement to continue his current treatment regimen as recommended. He was able to engage in safety planning including plan to return to [facility] or contact emergency services if he feels unable to maintain his own safety or the safety of others. Pt had no further questions, comments, or concerns. He left [facility] with all personal belongings in no apparent distress. Transportation per his family (mother).   Physical Findings: AIMS: Facial and Oral Movements Muscles of Facial Expression: None, normal Lips and Perioral Area: None, normal Jaw: None, normal Tongue: None, normal,Extremity Movements Upper (arms, wrists, hands, fingers): None, normal Lower (legs, knees, ankles, toes): None, normal, Trunk Movements Neck, shoulders, hips: None, normal, Overall Severity Severity of abnormal movements (highest score from questions above): None, normal Incapacitation due to abnormal movements: None, normal Patient's awareness of abnormal movements (rate only patient's report): No Awareness, Dental Status Current problems with teeth and/or dentures?: No Does patient usually wear  dentures?: No  CIWA:    COWS:     Musculoskeletal: Strength & Muscle Tone: within normal limits Gait & Station: normal Patient leans: N/A  Psychiatric Specialty Exam:  Presentation  General Appearance: Casual; Appropriate for Environment; Fairly Groomed  Eye Contact:Good  Speech:Normal Rate; Clear and  Coherent  Speech Volume:Normal  Handedness:Right  Mood and Affect  Mood:Euthymic  Affect:Congruent  Thought Process  Thought Processes:Goal Directed; Coherent  Descriptions of Associations:Intact  Orientation:Full (Time, Place and Person)  Thought Content:Logical  History of Schizophrenia/Schizoaffective disorder:No  Duration of Psychotic Symptoms:Less than six months  Hallucinations:No data recorded Ideas of Reference:None  Suicidal Thoughts:No data recorded Homicidal Thoughts:No data recorded  Sensorium  Memory:Immediate Fair; Recent Fair; Remote Fair  Judgment:Poor  Insight:Lacking  Executive Functions  Concentration:Fair  Attention Span:Fair  Recall:Fair  Fund of Knowledge:Fair  Language:Fair  Psychomotor Activity  Psychomotor Activity:No data recorded  Assets  Assets:Communication Skills; Housing; Leisure Time; Physical Health; Resilience; Social Support; Financial Resources/Insurance  Sleep  Sleep:No data recorded  Physical Exam: Physical Exam Vitals and nursing note reviewed.  HENT:     Head: Normocephalic.     Nose: Nose normal.     Mouth/Throat:     Pharynx: Oropharynx is clear.  Eyes:     Pupils: Pupils are equal, round, and reactive to light.  Cardiovascular:     Rate and Rhythm: Normal rate.     Pulses: Normal pulses.  Pulmonary:     Effort: Pulmonary effort is normal.  Genitourinary:    Comments: Deferred Musculoskeletal:        General: Normal range of motion.     Cervical back: Normal range of motion.  Skin:    General: Skin is warm and dry.  Neurological:     General: No focal deficit present.     Mental Status: He is alert and oriented to person, place, and time. Mental status is at baseline.    Review of Systems  Constitutional: Negative.   HENT: Negative.   Eyes: Negative.   Respiratory: Negative.   Cardiovascular: Negative.   Gastrointestinal: Negative.   Genitourinary: Negative.   Musculoskeletal:  Negative.   Skin: Negative.   Neurological: Negative.   Endo/Heme/Allergies:       Allergies: NKDA  Psychiatric/Behavioral: Positive for depression (Hx of (stable upon discharge)), hallucinations (Hx. psychosis (stable on medication)) and substance abuse (Hx. Thc use disorder). Negative for memory loss and suicidal ideas. The patient has insomnia (Hx. of (stable on medication)). The patient is not nervous/anxious (Stable upon discharge).    Blood pressure 128/67, pulse 88, temperature 98.1 F (36.7 C), temperature source Oral, resp. rate 18, height 5\' 8"  (1.727 m), weight 56.7 kg, SpO2 97 %. Body mass index is 19.01 kg/m.  Has this patient used any form of tobacco in the last 30 days? (Cigarettes, Smokeless Tobacco, Cigars, and/or Pipes): Yes, an FDA-approved tobacco cessation medication was offered at discharge.  Blood Alcohol level:  Lab Results  Component Value Date   ETH <10 06/09/2020   ETH <10 12/06/2018   Metabolic Disorder Labs:  Lab Results  Component Value Date   HGBA1C 5.6 06/09/2020   MPG 114 06/09/2020   MPG 99.67 02/28/2017   Lab Results  Component Value Date   PROLACTIN 4.4 06/09/2020   Lab Results  Component Value Date   CHOL 142 06/09/2020   TRIG 51 06/09/2020   HDL 51 06/09/2020   CHOLHDL 2.8 06/09/2020   VLDL 10 06/09/2020   LDLCALC 81 06/09/2020   LDLCALC  98 02/28/2017   See Psychiatric Specialty Exam and Suicide Risk Assessment completed by Attending Physician prior to discharge.  Discharge destination:  Home  Is patient on multiple antipsychotic therapies at discharge:  No   Has Patient had three or more failed trials of antipsychotic monotherapy by history:  No  Recommended Plan for Multiple Antipsychotic Therapies: NA  Allergies as of 06/26/2020   No Known Allergies     Medication List    TAKE these medications     Indication  benztropine 1 MG tablet Commonly known as: COGENTIN Take 1 tablet (1 mg total) by mouth 2 (two) times daily as  needed for tremors (EPS reaction).  Indication: Extrapyramidal Reaction caused by Medications   divalproex 500 MG 24 hr tablet Commonly known as: DEPAKOTE ER Take 3 tablets (1,500 mg total) by mouth at bedtime. For mood stabilization  Indication: Mood stabilization   loratadine 10 MG tablet Commonly known as: CLARITIN Take 1 tablet (10 mg total) by mouth daily. (May buy from over the counter): For allergies Start taking on: Jun 27, 2020  Indication: Hayfever   metoprolol tartrate 25 MG tablet Commonly known as: LOPRESSOR Take 1 tablet (25 mg total) by mouth 2 (two) times daily. For high blood pressure  Indication: High Blood Pressure Disorder   nicotine polacrilex 2 MG gum Commonly known as: NICORETTE Take 1 each (2 mg total) by mouth as needed. (May smoking cessation): For nicotine withdrawal  Indication: Nicotine Addiction   risperiDONE 2 MG tablet Commonly known as: RISPERDAL Take 1 tablet (2 mg) by mouth in am & 2 tablets (4 mg) at bedtime: For mood control  Indication: Mood control   traZODone 50 MG tablet Commonly known as: DESYREL Take 1 tablet (50 mg total) by mouth at bedtime as needed for sleep.  Indication: Trouble Sleeping       Follow-up Information    [facility] [place] [facility]. Go to.   Why: Please go to this provider for therapy and medication management services during the provider's walk in hours for new patients: M & W, at 8:00 am. * Please arrive no later than 8 am.  Medication management will be scheduled after your first therapy appt. Contact information: [address], [address] [place], [place] [address]  Phone: [phone] Fax:  [phone]             Follow-up recommendations: Activity:  As tolerated Diet: As recommended by your primary care doctor. Keep all scheduled follow-up appointments as recommended.   Comments: Prescriptions given at discharge.  Patient agreeable to plan.  Given opportunity to ask questions.  Appears to feel  comfortable with discharge denies any current suicidal or homicidal thought. Patient is also instructed prior to discharge to: Take all medications as prescribed by his/her mental healthcare provider. Report any adverse effects and or reactions from the medicines to his/her outpatient provider promptly. Patient has been instructed & cautioned: To not engage in alcohol and or illegal drug use while on prescription medicines. In the event of worsening symptoms, patient is instructed to call the crisis hotline, 911 and or go to the nearest ED for appropriate evaluation and treatment of symptoms. To follow-up with his/her primary care provider for your other medical issues, concerns and or health care needs.  Signed: Armandina StammerAgnes Iantha Titsworth, NP, PMHNP, FNP-BC 06/26/2020, 11:25 AM

## 2020-06-26 NOTE — BHH Suicide Risk Assessment (Signed)
[facility] Discharge Suicide Risk Assessment   Principal Problem: Schizophrenia spectrum disorder with psychotic disorder type not yet determined [facility]) Discharge Diagnoses: Principal Problem:   Schizophrenia spectrum disorder with psychotic disorder type not yet determined (HCC) Active Problems:   Marijuana abuse   Total Time spent with patient: 15 minutes  Musculoskeletal: Strength & Muscle Tone: within normal limits Gait & Station: normal Patient leans: N/A  Psychiatric Specialty Exam: Review of Systems  All other systems reviewed and are negative.   Blood pressure 128/67, pulse 88, temperature 98.1 F (36.7 C), temperature source Oral, resp. rate 18, height 5\' 8"  (1.727 m), weight 56.7 kg, SpO2 97 %.Body mass index is 19.01 kg/m.  General Appearance: Casual  Eye Contact::  Good  Speech:  Normal Rate409  Volume:  Normal  Mood:  Euthymic  Affect:  Congruent  Thought Process:  Coherent and Descriptions of Associations: Intact  Orientation:  Full (Time, Place, and Person)  Thought Content:  Logical  Suicidal Thoughts:  No  Homicidal Thoughts:  No  Memory:  Immediate;   Fair Recent;   Fair Remote;   Fair  Judgement:  Intact  Insight:  Fair  Psychomotor Activity:  Normal  Concentration:  Fair  Recall:  Good  Fund of Knowledge:Fair  Language: Good  Akathisia:  Negative  Handed:  Right  AIMS (if indicated):     Assets:  Desire for Improvement Housing Resilience Social Support  Sleep:  Number of Hours: 8  Cognition: WNL  ADL's:  Intact   Mental Status Per Nursing Assessment::   On Admission:  NA  Demographic Factors:  Male, Low socioeconomic status and Unemployed  Loss Factors: NA  Historical Factors: Impulsivity  Risk Reduction Factors:   Positive social support  Continued Clinical Symptoms:  Schizophrenia:   Less than 14 years old  Cognitive Features That Contribute To Risk:  Thought constriction (tunnel vision)    Suicide Risk:  Minimal: No  identifiable suicidal ideation.  Patients presenting with no risk factors but with morbid ruminations; may be classified as minimal risk based on the severity of the depressive symptoms   Follow-up Information    [facility]. Go to.   Why: Please go to this provider for therapy and medication management services during the provider's walk in hours for new patients: M & W, at 8:00 am. * Please arrive no later than 8 am.  Medication management will be scheduled after your first therapy appt. Contact information: [address], [address] [place], [place]  Phone: [phone] Fax:  [phone]              Plan Of Care/Follow-up recommendations:  Activity:  ad lib  [phone], MD 06/26/2020, 7:45 AM

## 2020-06-26 NOTE — Plan of Care (Signed)
Patient participated in groups with no prompting or encouragement from LRT at completion of recreation therapy group sessions.   Caroll Rancher, LRT/CTRS

## 2020-06-26 NOTE — Progress Notes (Signed)
Recreation Therapy Notes  INPATIENT RECREATION TR PLAN  Patient Details Name: Jay Woods MRN: [id number] DOB: 03/09/2000 Today's Date: 06/26/2020  Rec Therapy Plan Is patient appropriate for Therapeutic Recreation?: Yes Treatment times per week: about 3 days Estimated Length of Stay: 5-7 days TR Treatment/Interventions: Group participation (Comment)  Discharge Criteria Pt will be discharged from therapy if:: Discharged Treatment plan/goals/alternatives discussed and agreed upon by:: Patient/family  Discharge Summary Short term goals set: See patient care plan Short term goals met: Complete Progress toward goals comments: Groups attended Which groups?: Self-esteem,Wellness,Communication,Coping skills,Leisure education,Other (Comment) (Team Building; Music Therapy; Decision Making) Reason goals not met: None Therapeutic equipment acquired: N/A Reason patient discharged from therapy: Discharge from hospital Pt/family agrees with progress & goals achieved: Yes Date patient discharged from therapy: 06/26/20    Victorino Sparrow, LRT/CTRS   Ria Comment, [place] A 06/26/2020, 12:26 PM

## 2020-06-26 NOTE — Plan of Care (Signed)
Discharge note  Patient verbalizes readiness for discharge. Follow up plan explained, AVS, Transition record and SRA given. Prescriptions and teaching provided. Belongings returned and signed for. Suicide safety plan completed and signed. Patient verbalizes understanding. Patient denies SI/HI and assures this Clinical research associate they will seek assistance should that change. Patient discharged to lobby where family was waiting.  Problem: Education: Goal: Utilization of techniques to improve thought processes will improve Outcome: Adequate for Discharge   Problem: Activity: Goal: Imbalance in normal sleep/wake cycle will improve Outcome: Adequate for Discharge   Problem: Coping: Goal: Coping ability will improve Outcome: Adequate for Discharge Goal: Will verbalize feelings Outcome: Adequate for Discharge   Problem: Health Behavior/Discharge Planning: Goal: Ability to make decisions will improve Outcome: Adequate for Discharge   Problem: Role Relationship: Goal: Will demonstrate positive changes in social behaviors and relationships Outcome: Adequate for Discharge   Problem: Safety: Goal: Ability to disclose and discuss suicidal ideas will improve Outcome: Adequate for Discharge Goal: Ability to identify and utilize support systems that promote safety will improve Outcome: Adequate for Discharge   Problem: Self-Concept: Goal: Will verbalize positive feelings about self Outcome: Adequate for Discharge Goal: Level of anxiety will decrease Outcome: Adequate for Discharge   Problem: Activity: Goal: Will verbalize the importance of balancing activity with adequate rest periods Outcome: Adequate for Discharge   Problem: Education: Goal: Will be free of psychotic symptoms 06/26/2020 1459 by Raylene Miyamoto, RN Outcome: Adequate for Discharge 06/26/2020 0854 by Raylene Miyamoto, RN Outcome: Progressing   Problem: Coping: Goal: Coping ability will improve 06/26/2020 1459 by Raylene Miyamoto, RN Outcome: Adequate for Discharge 06/26/2020 0854 by Raylene Miyamoto, RN Outcome: Progressing Goal: Will verbalize feelings 06/26/2020 1459 by Raylene Miyamoto, RN Outcome: Adequate for Discharge 06/26/2020 0854 by Raylene Miyamoto, RN Outcome: Progressing   Problem: Health Behavior/Discharge Planning: Goal: Compliance with prescribed medication regimen will improve Outcome: Adequate for Discharge   Problem: Nutritional: Goal: Ability to achieve adequate nutritional intake will improve Outcome: Adequate for Discharge   Problem: Role Relationship: Goal: Ability to communicate needs accurately will improve Outcome: Adequate for Discharge Goal: Ability to interact with others will improve Outcome: Adequate for Discharge   Problem: Safety: Goal: Ability to redirect hostility and anger into socially appropriate behaviors will improve Outcome: Adequate for Discharge Goal: Ability to remain free from injury will improve Outcome: Adequate for Discharge   Problem: Self-Care: Goal: Ability to participate in self-care as condition permits will improve Outcome: Adequate for Discharge   Problem: Self-Concept: Goal: Will verbalize positive feelings about self Outcome: Adequate for Discharge   Problem: Education: Goal: Knowledge of  General Education information/materials will improve Outcome: Adequate for Discharge Goal: Emotional status will improve Outcome: Adequate for Discharge Goal: Mental status will improve Outcome: Adequate for Discharge Goal: Verbalization of understanding the information provided will improve Outcome: Adequate for Discharge   Problem: Activity: Goal: Interest or engagement in activities will improve Outcome: Adequate for Discharge Goal: Sleeping patterns will improve Outcome: Adequate for Discharge   Problem: Coping: Goal: Ability to verbalize frustrations and anger appropriately will improve Outcome: Adequate for Discharge Goal:  Ability to demonstrate self-control will improve Outcome: Adequate for Discharge   Problem: Health Behavior/Discharge Planning: Goal: Identification of resources available to assist in meeting health care needs will improve Outcome: Adequate for Discharge Goal: Compliance with treatment plan for underlying cause of condition will improve Outcome: Adequate for Discharge   Problem: Physical Regulation: Goal: Ability to maintain clinical measurements  within normal limits will improve Outcome: Adequate for Discharge   Problem: Safety: Goal: Periods of time without injury will increase Outcome: Adequate for Discharge

## 2021-08-11 IMAGING — CT CT HEAD W/O CM
3 series · 14 of 47 positions shown, 16 images · non-contrast
Comparison: None.

CLINICAL DATA: Altered mental status with violent behavior and
questionable hallucinations

EXAM:
CT HEAD WITHOUT CONTRAST
TECHNIQUE: Contiguous axial images were obtained from the base of the skull
through the vertex without intravenous contrast.

[Series 2: head wo · axial · 0.49mm/px · z∈[+1244,+1369]mm · 8 of 31 slices shown, 10 images]
[im 3/31  brain]
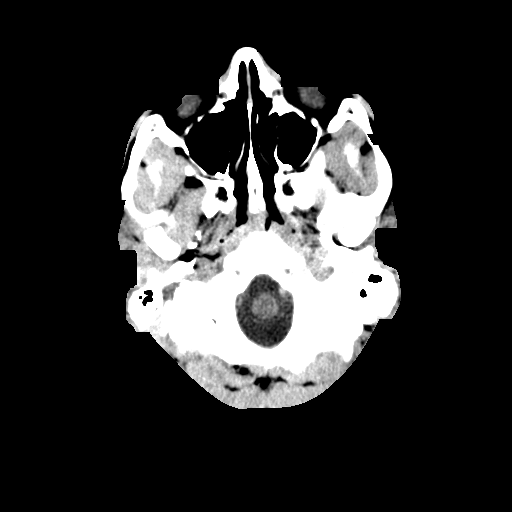
[im 3/31  bone]
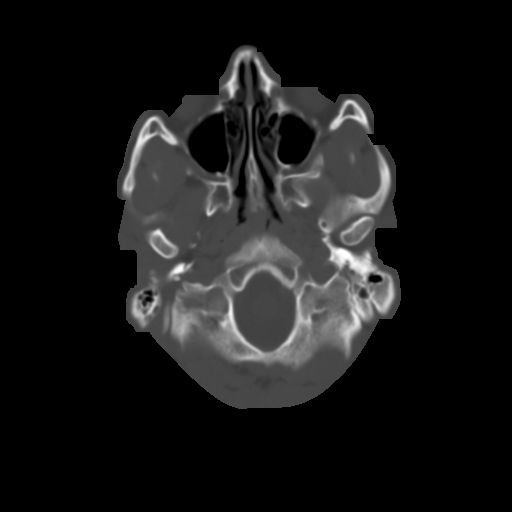
[im 7/31  brain]
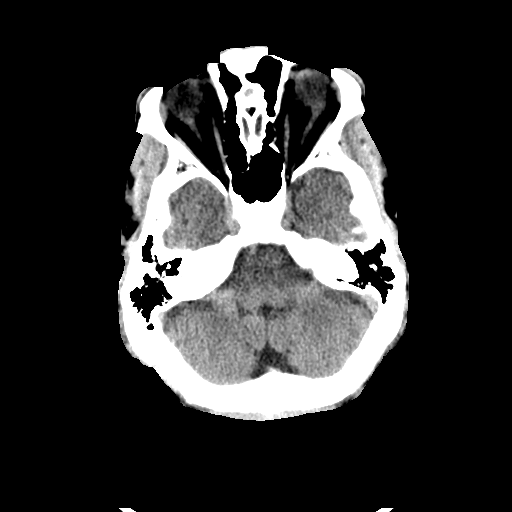
[im 10/31  brain]
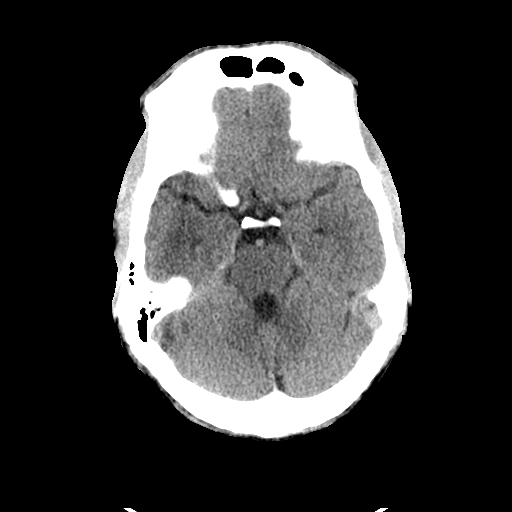
[im 14/31  brain]
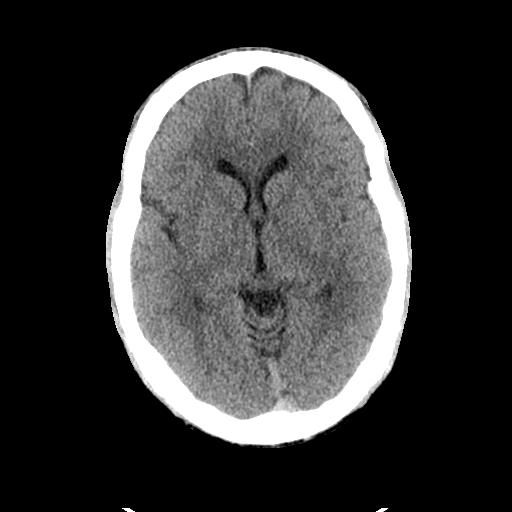
[im 17/31  brain]
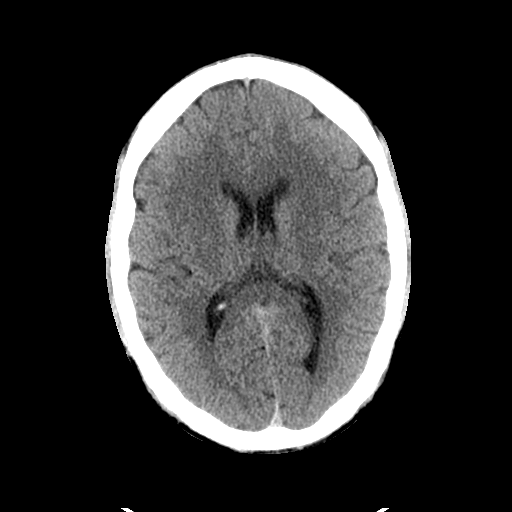
[im 17/31  bone]
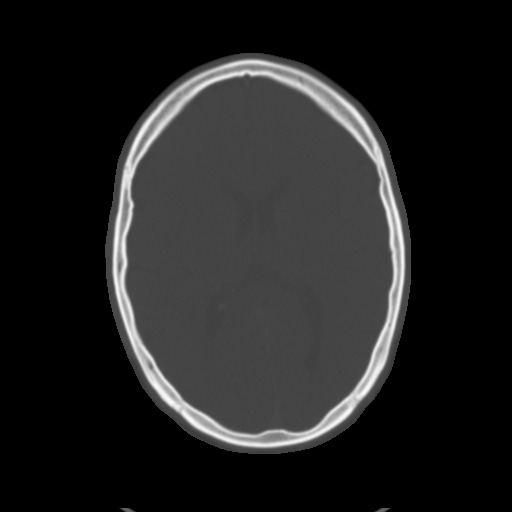
[im 21/31  brain]
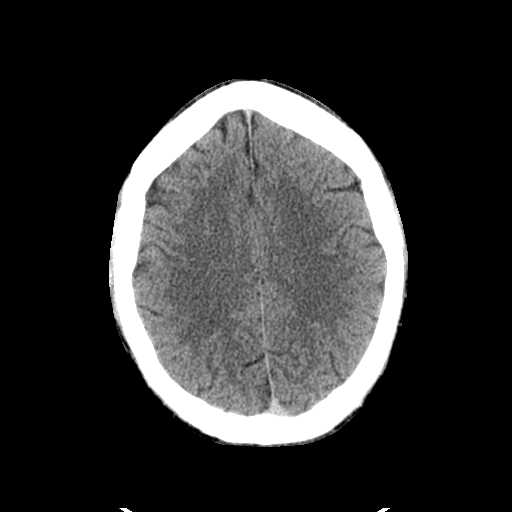
[im 24/31  brain]
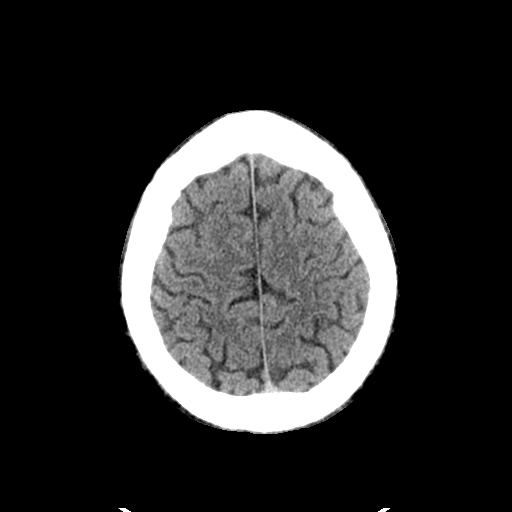
[im 28/31  brain]
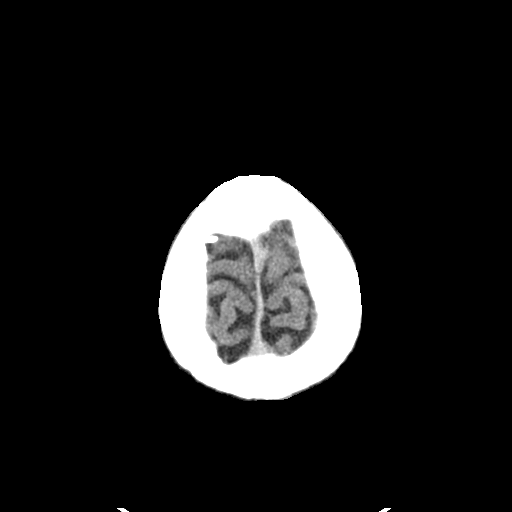

[Series 4: coronal soft tissue · coronal · 0.36mm/px · 3 of 66 slices shown]
[im 22/66  brain]
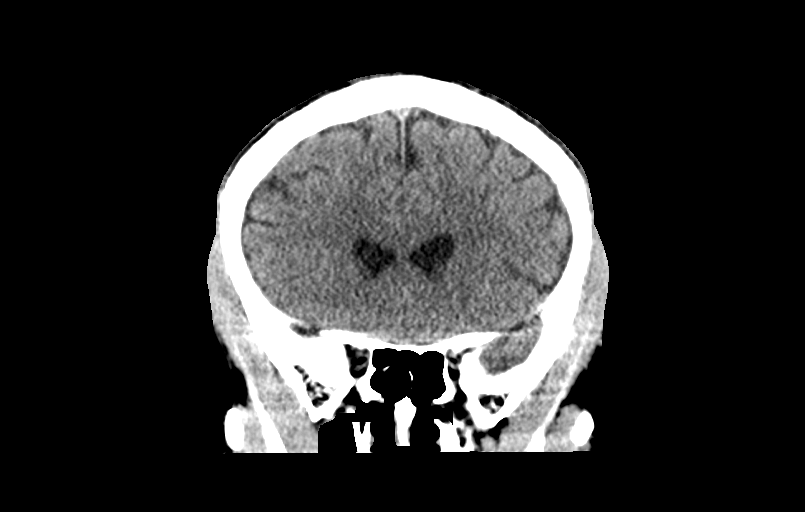
[im 29/66  brain]
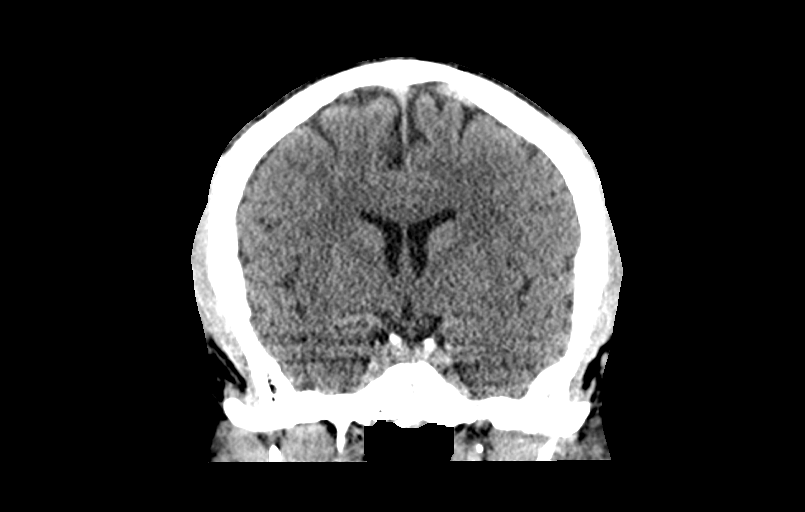
[im 37/66  brain]
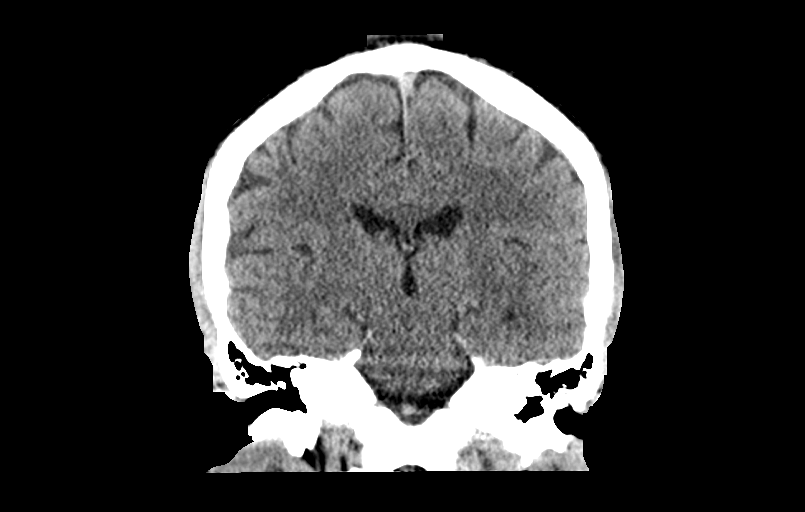

[Series 5: sagittal soft tissue · sagittal · 0.41mm/px · 3 of 53 slices shown]
[im 18/53  brain]
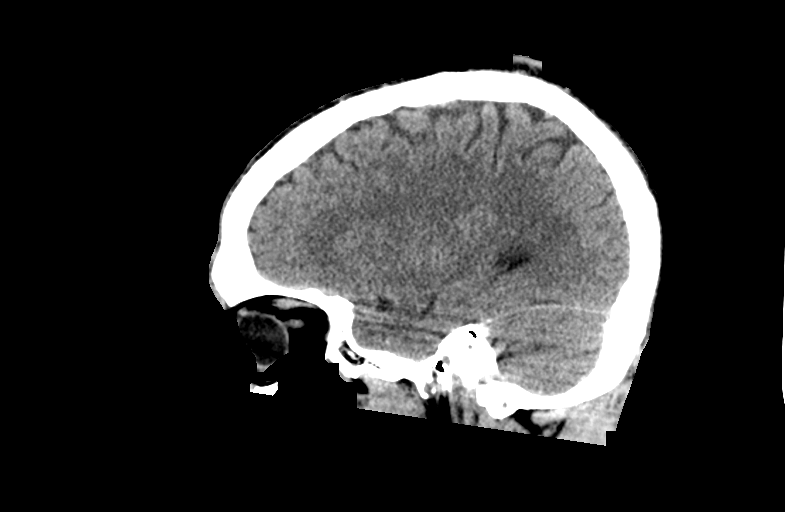
[im 27/53  brain]
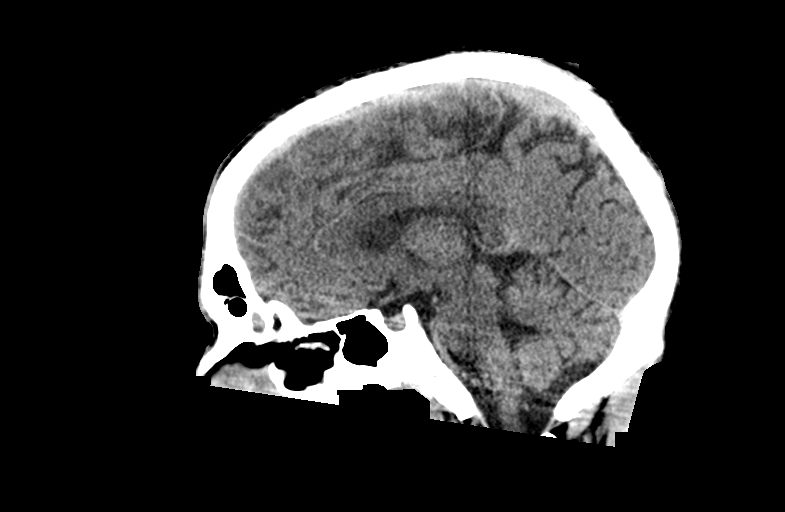
[im 35/53  brain]
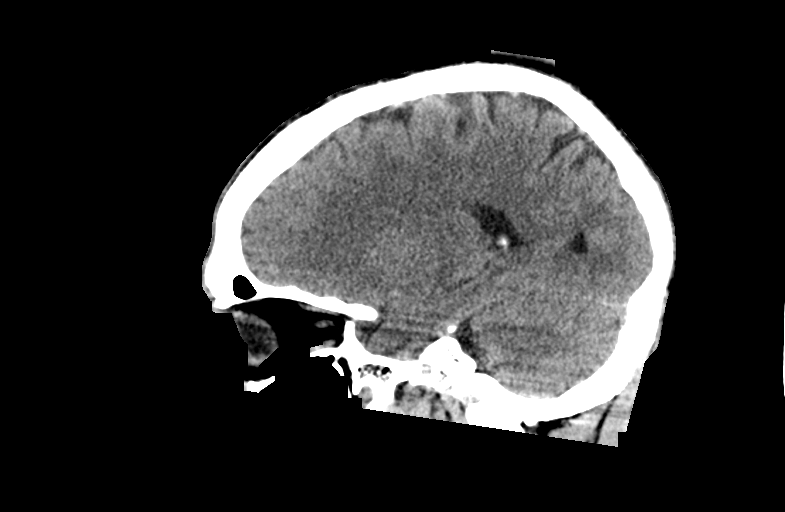

[14 of 47 positions shown; findings below may reference images not displayed]

FINDINGS: Brain: Ventricles are normal in size and configuration. There is no
intracranial mass, hemorrhage, extra-axial fluid collection, or
midline shift. Brain parenchyma appears unremarkable. No evident
acute infarct.

Vascular: There is no hyperdense vessel. No vascular calcification
evident.

Skull: The bony calvarium appears intact.

Sinuses/Orbits: There is opacification in multiple ethmoid air
cells. Other paranasal sinuses which are visualized are clear. There
is a bony defect in the medial left orbital wall does not appear
acute. Orbits otherwise appear symmetric bilaterally.

Other: Mastoid air cells are clear.
IMPRESSION: No intracranial mass or hemorrhage. Brain parenchyma appears
unremarkable.

There are foci of ethmoid sinus disease.

## 2021-09-24 ENCOUNTER — Ambulatory Visit (HOSPITAL_COMMUNITY)
Admission: EM | Admit: 2021-09-24 | Discharge: 2021-09-26 | Disposition: A | Discharge status: Other Acute Inpatient Hospital | Payer: No Payment, Other | Attending: Psychiatry | Admitting: Psychiatry

## 2021-09-24 ENCOUNTER — Inpatient Hospital Stay (HOSPITAL_COMMUNITY)
Admission: AD | Admit: 2021-09-24 | Discharge: 2021-09-24 | DRG: 897 | Disposition: A | Discharge status: Other Acute Inpatient Hospital | Payer: Federal, State, Local not specified - Other | Attending: Psychiatry | Admitting: Psychiatry

## 2021-09-24 DIAGNOSIS — F1910 Other psychoactive substance abuse, uncomplicated: Secondary | ICD-10-CM | POA: Insufficient documentation

## 2021-09-24 DIAGNOSIS — F29 Unspecified psychosis not due to a substance or known physiological condition: Secondary | ICD-10-CM

## 2021-09-24 DIAGNOSIS — F32A Depression, unspecified: Secondary | ICD-10-CM | POA: Diagnosis present

## 2021-09-24 DIAGNOSIS — Z20822 Contact with and (suspected) exposure to covid-19: Secondary | ICD-10-CM | POA: Diagnosis present

## 2021-09-24 DIAGNOSIS — F6381 Intermittent explosive disorder: Secondary | ICD-10-CM

## 2021-09-24 DIAGNOSIS — F12159 Cannabis abuse with psychotic disorder, unspecified: Secondary | ICD-10-CM | POA: Diagnosis present

## 2021-09-24 DIAGNOSIS — R4589 Other symptoms and signs involving emotional state: Secondary | ICD-10-CM

## 2021-09-24 DIAGNOSIS — F419 Anxiety disorder, unspecified: Secondary | ICD-10-CM | POA: Diagnosis present

## 2021-09-24 DIAGNOSIS — F1210 Cannabis abuse, uncomplicated: Principal | ICD-10-CM

## 2021-09-24 LAB — SARS CORONAVIRUS 2 BY RT PCR: SARS Coronavirus 2 by RT PCR: NEGATIVE

## 2021-09-24 MED ORDER — ALUM & MAG HYDROXIDE-SIMETH 200-200-20 MG/5ML PO SUSP: 30.0000 mL | ORAL | Status: DC | PRN

## 2021-09-24 MED ORDER — MAGNESIUM HYDROXIDE 400 MG/5ML PO SUSP: 30.0000 mL | Freq: Every day | ORAL | Status: DC | PRN

## 2021-09-24 MED ORDER — ACETAMINOPHEN 325 MG PO TABS: 650.0000 mg | ORAL_TABLET | Freq: Four times a day (QID) | ORAL | Status: DC | PRN

## 2021-09-24 NOTE — ED Provider Notes (Incomplete)
[facility] Continuous Assessment Admission H&P  Date: 09/24/21 Patient Name: Jay Woods MRN: [id number] Chief Complaint: No chief complaint on file.     Diagnoses:  Final diagnoses:  Polysubstance abuse (HCC)  Anxious appearance    HPI: Jay Woods,  21 y.o male, with a history of substance abuse disorder, presented to [facility] as a transfer.  Patient stated I feel safe now, patient stated he was using cocaine, K2, crystal meth, marijuana, CBD black magic, ecstasy.  Patient stated he last used a few days ago.  Please see admission notes from ED provider:  Amadi Woods is a 21 y.o. male. Patient is a 21 years old male who presents to the hospital with disorganized thoughts, paranoia and substance use disorder.  Patient reports not feeling good physically and mentally. Patient states he took a lot of drugs and he is here to seek for help.  Patient rated his anxiety as 10/10, 10 being the worst anxiety ever.  States he has not slept for the past 24 hours.   Assessment: The patient appears very anxious, states people are out to get him.  Patient reported an sad /anxious mood and his Affect was somewhat labile and tearful.  Patient stated he was diagnosed with depression when he was 21 years old.  His thought process was somewhat disorganized with tangential association.  When the provider asked the patient how we can help him today he stated "I have faith and hope, my girlfriend in school, we used to do crystal meth and together (crying) and she passed away as she had a baby."  The patient started crying profusely.  The patient appears to be delusional about girls.  Reported having a girlfriend and when he sees other cars, he sees the girl's car spinning inside his head.   Pt. grooming and hygiene were fair, and his behavior was cooperative and calm.  The patient's speech was within normal limits for rate and volume. His communications were coherent but not goal directed.   The patient's eye contact was appropriate and fair. The patient's motor activity was unremarkable, with a normal gait. The Patient described his mood as Anxious and depressed.  His Affect was Labile with crying spells.  The Patient was oriented to person, place, time, and situation.  Attention and memory functions were assessed as grossly intact. The Patient did not appear to be attending to internal stimuli during the discussion.  Furthermore, the patient denied auditory hallucinations but endorse seeing shadows and denied feelings of derealization and/or depersonalization.  The patient endorses paranoia, he feels people are out to get him.  The patient did not endorse or display any grandiose, religious delusions but appears to have delusions about girls. The patient denied current suicidal/homicidal ideation/plan/intent.   Face-to-face observation of patient, patient is alert and oriented x 4, speech is clear, without word salad.  Mood is labile affect congruent with mood.  Patient denies SI, HI, AVH, or paranoia at this time patient denies he is seeing a psychiatrist or therapist at this time, patient lives at home with his mom and his younger sister.  Per the patient he just wants to change his life.  Recommend inpatient observation with possibility for [facility]  PHQ 2-9:   Flowsheet Row Admission (Current) [facility]Discharged) [facility]  C-SSRS RISK CATEGORY No Risk No  Risk No Risk        Total Time spent with patient: 20 minutes  Musculoskeletal  Strength & Muscle Tone: within normal limits Gait & Station: normal Patient leans: N/A  Psychiatric Specialty Exam  Presentation General Appearance: Casual  Eye Contact:Fair  Speech:Clear and Coherent  Speech  Volume:Normal  Handedness:Ambidextrous   Mood and Affect  Mood:Anxious; Labile  Affect:Labile   Thought Process  Thought Processes:Coherent  Descriptions of Associations:Circumstantial  Orientation:Full (Time, Place and Person)  Thought Content:WDL  Diagnosis of Schizophrenia or Schizoaffective disorder in past: Yes  Duration of Psychotic Symptoms: Greater than six months  Hallucinations:Hallucinations: None Description of Visual Hallucinations: Pt. reports seeing shadows  Ideas of Reference:None  Suicidal Thoughts:Suicidal Thoughts: No  Homicidal Thoughts:Homicidal Thoughts: No   Sensorium  Memory:Immediate Fair  Judgment:Poor  Insight:Fair   Executive Functions  Concentration:Fair  Attention Span:Fair  Recall:Fair  Fund of Knowledge:Fair  Language:Fair   Psychomotor Activity  Psychomotor Activity:Psychomotor Activity: Normal   Assets  Assets:Desire for Improvement   Sleep  Sleep:Sleep: Poor Number of Hours of Sleep: 0   Nutritional Assessment (For O[facility] and [facility] admissions only) Has the patient had a weight loss or gain of 10 pounds or more in the last 3 months?: No Has the patient had a decrease in food intake/or appetite?: No Does the patient have dental problems?: No Does the patient have eating habits or behaviors that may be indicators of an eating disorder including binging or inducing vomiting?: No Has the patient recently lost weight without trying?: 0 Has the patient been eating poorly because of a decreased appetite?: 0 Malnutrition Screening Tool Score: 0    Physical Exam HENT:     Head: Normocephalic.     Nose: Nose normal.  Cardiovascular:     Rate and Rhythm: Normal rate.  Pulmonary:     Effort: Pulmonary effort is normal.  Musculoskeletal:        General: Normal range of motion.     Cervical back: Normal range of motion.  Skin:    General: Skin is warm.  Neurological:     General: No focal deficit present.      Mental Status: He is alert.  Psychiatric:        Mood and Affect: Mood normal.        Behavior: Behavior normal.        Thought Content: Thought content normal.        Judgment: Judgment normal.    Review of Systems  Constitutional: Negative.   HENT: Negative.    Eyes: Negative.   Respiratory: Negative.    Cardiovascular: Negative.   Gastrointestinal: Negative.   Genitourinary: Negative.   Musculoskeletal: Negative.   Skin: Negative.   Neurological: Negative.   Endo/Heme/Allergies: Negative.   Psychiatric/Behavioral:  Positive for substance abuse. The patient is nervous/anxious.     Blood pressure 129/80, pulse 67, temperature 97.8 F (36.6 C), temperature source Oral, resp. rate 18, SpO2 98 %. There is no height or weight on file to calculate BMI.  Past Psychiatric History: Polysubstance use abuse  Is the patient at risk to self? No  Has the patient been a risk to self in the past 6 months? No .    Has the patient been a risk to self within the distant past? No   Is the patient a risk to others? No   Has the patient been a risk to others in the past 6 months? No   Has the patient been a  risk to others within the distant past? No   Past Medical History:  Past Medical History:  Diagnosis Date  . Eczema    No past surgical history on file.  Family History: No family history on file.  Social History:  Social History   Socioeconomic History  . Marital status: Single    Spouse name: Not on file  . Number of children: Not on file  . Years of education: Not on file  . Highest education level: Not on file  Occupational History  . Not on file  Tobacco Use  . Smoking status: Former    Packs/day: 1.00    Years: 5.00    Total pack years: 5.00    Types: Cigarettes  . Smokeless tobacco: Former  Advertising account planner  . Vaping Use: Some days  . Substances: CBD  Substance and Sexual Activity  . Alcohol use: Yes    Alcohol/week: 1.0 standard drink of alcohol    Types: 1 Cans of  beer per week    Comment: twice/week  . Drug use: Yes    Types: Marijuana  . Sexual activity: Not Currently  Other Topics Concern  . Not on file  Social History Narrative  . Not on file   Social Determinants of Health   Financial Resource Strain: Not on file  Food Insecurity: Not on file  Transportation Needs: Not on file  Physical Activity: Not on file  Stress: Not on file  Social Connections: Not on file  Intimate Partner Violence: Not on file    SDOH:  SDOH Screenings   Alcohol Screen: Low Risk  (06/10/2020)   Alcohol Screen   . Last Alcohol Screening Score (AUDIT): 3  Depression (PHQ2-9): Not on file  Financial Resource Strain: Not on file  Food Insecurity: Not on file  Housing: Not on file  Physical Activity: Not on file  Social Connections: Not on file  Stress: Not on file  Tobacco Use: Medium Risk (06/10/2020)   Patient History   . Smoking Tobacco Use: Former   . Smokeless Tobacco Use: Former   . Passive Exposure: Not on file  Transportation Needs: Not on file    Last Labs:  Admission on 09/24/2021  Component Date Value Ref Range Status  . SARS Coronavirus 2 by RT PCR 09/24/2021 NEGATIVE  NEGATIVE Final   Comment: (NOTE) SARS-CoV-2 target nucleic acids are NOT DETECTED.  The SARS-CoV-2 RNA is generally detectable in upper and lower respiratory specimens during the acute phase of infection. The lowest concentration of SARS-CoV-2 viral copies this assay can detect is 250 copies / mL. A negative result does not preclude SARS-CoV-2 infection and should not be used as the sole basis for treatment or other patient management decisions.  A negative result may occur with improper specimen collection / handling, submission of specimen other than nasopharyngeal swab, presence of viral mutation(s) within the areas targeted by this assay, and inadequate number of viral copies (<250 copies / mL). A negative result must be combined with clinical observations, patient  history, and epidemiological information.  Fact Sheet for Patients:   RoadLapTop.co.za  Fact Sheet for Healthcare Providers: http://kim-miller.com/  This test is not yet approved or                           cleared by the [place] FDA and has been authorized for detection and/or diagnosis of SARS-CoV-2 by FDA under an Emergency Use Authorization (EUA).  This EUA will  remain in effect (meaning this test can be used) for the duration of the COVID-19 declaration under Section 564(b)(1) of the Act, 21 U.S.C. section 360bbb-3(b)(1), unless the authorization is terminated or revoked sooner.  Performed at [facility], [address] [place], [place] [address]     Allergies: Patient has no known allergies.  PTA Medications: (Not in a hospital admission)   Medical Decision Making  Inpatient observation    Recommendations  Based on my evaluation the patient does not appear to have an emergency medical condition.  Sindy Guadeloupe, NP 09/24/21  11:22 PM

## 2021-09-24 NOTE — ED Provider Notes (Signed)
[facility] Continuous Assessment Admission H&P  Date: 09/25/21 Patient Name: Jay Woods MRN: [id number] Chief Complaint:  Chief Complaint  Patient presents with   Psychosis   Addiction Problem   Anxiety      Diagnoses:  Final diagnoses:  Polysubstance abuse (HCC)  Anxious appearance    HPI: Jay Woods,  21 y.o male, with a history of substance abuse disorder, presented to [facility] as a transfer.  Patient stated I feel safe now, patient stated he was using cocaine, K2, crystal meth, marijuana, CBD black magic, ecstasy.  Patient stated he last used a few days ago.  Please see admission notes from ED provider:  [name] is a 21 y.o. male. Patient is a 21 years old male who presents to the hospital with disorganized thoughts, paranoia and substance use disorder.  Patient reports not feeling good physically and mentally. Patient states he took a lot of drugs and he is here to seek for help.  Patient rated his anxiety as 10/10, 10 being the worst anxiety ever.  States he has not slept for the past 24 hours.   Assessment: The patient appears very anxious, states people are out to get him.  Patient reported an sad /anxious mood and his Affect was somewhat labile and tearful.  Patient stated he was diagnosed with depression when he was 21 years old.  His thought process was somewhat disorganized with tangential association.  When the provider asked the patient how we can help him today he stated "I have faith and hope, my girlfriend in school, we used to do crystal meth and together (crying) and she passed away as she had a baby."  The patient started crying profusely.  The patient appears to be delusional about girls.  Reported having a girlfriend and when he sees other cars, he sees the girl's car spinning inside his head.   Pt. grooming and hygiene were fair, and his behavior was cooperative and calm.  The patient's speech was within normal limits for rate and  volume. His communications were coherent but not goal directed.  The patient's eye contact was appropriate and fair. The patient's motor activity was unremarkable, with a normal gait. The Patient described his mood as Anxious and depressed.  His Affect was Labile with crying spells.  The Patient was oriented to person, place, time, and situation.  Attention and memory functions were assessed as grossly intact. The Patient did not appear to be attending to internal stimuli during the discussion.  Furthermore, the patient denied auditory hallucinations but endorse seeing shadows and denied feelings of derealization and/or depersonalization.  The patient endorses paranoia, he feels people are out to get him.  The patient did not endorse or display any grandiose, religious delusions but appears to have delusions about girls. The patient denied current suicidal/homicidal ideation/plan/intent.   Face-to-face observation of patient, patient is alert and oriented x 4, speech is clear, without word salad.  Mood is labile affect congruent with mood.  Patient denies SI, HI, AVH, or paranoia at this time patient denies he is seeing a psychiatrist or therapist at this time, patient lives at home with his mom and his younger sister.  Per the patient he just wants to change his life.  Recommend inpatient observation with possibility for [facility]  PHQ 2-9:   Flowsheet Row ED from 09/24/2021 in [facility] Most recent reading at 09/25/2021 12:19 AM Admission (Discharged) from OP [facility] Most recent reading at 09/24/2021  4:12 PM Admission (Discharged) [facility] Most recent reading at 06/10/2020  6:00 PM  C-SSRS RISK CATEGORY No Risk No Risk No Risk        Total Time spent with patient: 20 minutes  Musculoskeletal  Strength & Muscle Tone: within normal limits Gait & Station:  normal Patient leans: N/A  Psychiatric Specialty Exam  Presentation General Appearance: Casual  Eye Contact:Fair  Speech:Clear and Coherent  Speech Volume:Normal  Handedness:Ambidextrous   Mood and Affect  Mood:Anxious; Labile  Affect:Labile   Thought Process  Thought Processes:Coherent  Descriptions of Associations:Circumstantial  Orientation:Full (Time, Place and Person)  Thought Content:WDL  Diagnosis of Schizophrenia or Schizoaffective disorder in past: Yes  Duration of Psychotic Symptoms: Greater than six months  Hallucinations:Hallucinations: None Description of Visual Hallucinations: Pt. reports seeing shadows  Ideas of Reference:None  Suicidal Thoughts:Suicidal Thoughts: No  Homicidal Thoughts:Homicidal Thoughts: No   Sensorium  Memory:Immediate Fair  Judgment:Poor  Insight:Fair   Executive Functions  Concentration:Fair  Attention Span:Fair  Recall:Fair  Fund of Knowledge:Fair  Language:Fair   Psychomotor Activity  Psychomotor Activity:Psychomotor Activity: Normal   Assets  Assets:Desire for Improvement   Sleep  Sleep:Sleep: Poor Number of Hours of Sleep: 0   Nutritional Assessment (For OBS and FBC admissions only) Has the patient had a weight loss or gain of 10 pounds or more in the last 3 months?: No Has the patient had a decrease in food intake/or appetite?: No Does the patient have dental problems?: No Does the patient have eating habits or behaviors that may be indicators of an eating disorder including binging or inducing vomiting?: No Has the patient recently lost weight without trying?: 0 Has the patient been eating poorly because of a decreased appetite?: 0 Malnutrition Screening Tool Score: 0    Physical Exam HENT:     Head: Normocephalic.     Nose: Nose normal.  Cardiovascular:     Rate and Rhythm: Normal rate.  Pulmonary:     Effort: Pulmonary effort is normal.  Musculoskeletal:        General: Normal  range of motion.     Cervical back: Normal range of motion.  Skin:    General: Skin is warm.  Neurological:     General: No focal deficit present.     Mental Status: He is alert.  Psychiatric:        Mood and Affect: Mood normal.        Behavior: Behavior normal.        Thought Content: Thought content normal.        Judgment: Judgment normal.    Review of Systems  Constitutional: Negative.   HENT: Negative.    Eyes: Negative.   Respiratory: Negative.    Cardiovascular: Negative.   Gastrointestinal: Negative.   Genitourinary: Negative.   Musculoskeletal: Negative.   Skin: Negative.   Neurological: Negative.   Endo/Heme/Allergies: Negative.   Psychiatric/Behavioral:  Positive for substance abuse. The patient is nervous/anxious.     Blood pressure 129/80, pulse 67, temperature 97.8 F (36.6 C), temperature source Oral, resp. rate 18, SpO2 98 %. There is no height or weight on file to calculate BMI.  Past Psychiatric History: Polysubstance use abuse  Is the patient at risk to self? No  Has the patient been a risk to self in the past 6 months? No .    Has the patient been a risk to self within the distant  past? No   Is the patient a risk to others? No   Has the patient been a risk to others in the past 6 months? No   Has the patient been a risk to others within the distant past? No   Past Medical History:  Past Medical History:  Diagnosis Date   Eczema    No past surgical history on file.  Family History: No family history on file.  Social History:  Social History   Socioeconomic History   Marital status: Single    Spouse name: Not on file   Number of children: Not on file   Years of education: Not on file   Highest education level: Not on file  Occupational History   Not on file  Tobacco Use   Smoking status: Former    Packs/day: 1.00    Years: 5.00    Total pack years: 5.00    Types: Cigarettes   Smokeless tobacco: Former  Scientific laboratory technician Use:  Some days   Substances: CBD  Substance and Sexual Activity   Alcohol use: Yes    Alcohol/week: 1.0 standard drink of alcohol    Types: 1 Cans of beer per week    Comment: twice/week   Drug use: Yes    Types: Marijuana   Sexual activity: Not Currently  Other Topics Concern   Not on file  Social History Narrative   Not on file   Social Determinants of Health   Financial Resource Strain: Not on file  Food Insecurity: Not on file  Transportation Needs: Not on file  Physical Activity: Not on file  Stress: Not on file  Social Connections: Not on file  Intimate Partner Violence: Not on file    SDOH:  SDOH Screenings   Alcohol Screen: Low Risk  (06/10/2020)   Alcohol Screen    Last Alcohol Screening Score (AUDIT): 3  Depression (PHQ2-9): Not on file  Financial Resource Strain: Not on file  Food Insecurity: Not on file  Housing: Not on file  Physical Activity: Not on file  Social Connections: Not on file  Stress: Not on file  Tobacco Use: Medium Risk (06/10/2020)   Patient History    Smoking Tobacco Use: Former    Smokeless Tobacco Use: Former    Passive Exposure: Not on Pensions consultant Needs: Not on file    Last Labs:  Admission on 09/24/2021  Component Date Value Ref Range Status   SARS Coronavirus 2 by RT PCR 09/24/2021 NEGATIVE  NEGATIVE Final   Comment: (NOTE) SARS-CoV-2 target nucleic acids are NOT DETECTED.  The SARS-CoV-2 RNA is generally detectable in upper respiratory specimens during the acute phase of infection. The lowest concentration of SARS-CoV-2 viral copies this assay can detect is 138 copies/mL. A negative result does not preclude SARS-Cov-2 infection and should not be used as the sole basis for treatment or other patient management decisions. A negative result may occur with  improper specimen collection/handling, submission of specimen other than nasopharyngeal swab, presence of viral mutation(s) within the areas targeted by this assay, and  inadequate number of viral copies(<138 copies/mL). A negative result must be combined with clinical observations, patient history, and epidemiological information. The expected result is Negative.  Fact Sheet for Patients:  EntrepreneurPulse.com.au  Fact Sheet for Healthcare Providers:  IncredibleEmployment.be  This test is no                          t  yet approved or cleared by the [place] and  has been authorized for detection and/or diagnosis of SARS-CoV-2 by FDA under an Emergency Use Authorization (EUA). This EUA will remain  in effect (meaning this test can be used) for the duration of the COVID-19 declaration under Section 564(b)(1) of the Act, 21 U.S.C.section 360bbb-3(b)(1), unless the authorization is terminated  or revoked sooner.       Influenza A by PCR 09/24/2021 NEGATIVE  NEGATIVE Final   Influenza B by PCR 09/24/2021 NEGATIVE  NEGATIVE Final   Comment: (NOTE) The Xpert Xpress SARS-CoV-2/FLU/RSV plus assay is intended as an aid in the diagnosis of influenza from Nasopharyngeal swab specimens and should not be used as a sole basis for treatment. Nasal washings and aspirates are unacceptable for Xpert Xpress SARS-CoV-2/FLU/RSV testing.  Fact Sheet for Patients: EntrepreneurPulse.com.au  Fact Sheet for Healthcare Providers: IncredibleEmployment.be  This test is not yet approved or cleared by the [place] FDA and has been authorized for detection and/or diagnosis of SARS-CoV-2 by FDA under an Emergency Use Authorization (EUA). This EUA will remain in effect (meaning this test can be used) for the duration of the COVID-19 declaration under Section 564(b)(1) of the Act, 21 U.S.C. section 360bbb-3(b)(1), unless the authorization is terminated or revoked.  Performed at [facility], [place] [address], [place], [place] [address]    WBC 09/24/2021 9.0  4.0 - 10.5 K/uL Final    RBC 09/24/2021 5.01  4.22 - 5.81 MIL/uL Final   Hemoglobin 09/24/2021 15.5  13.0 - 17.0 g/dL Final   HCT 09/24/2021 45.3  39.0 - 52.0 % Final   MCV 09/24/2021 90.4  80.0 - 100.0 fL Final   MCH 09/24/2021 30.9  26.0 - 34.0 pg Final   MCHC 09/24/2021 34.2  30.0 - 36.0 g/dL Final   RDW 09/24/2021 12.7  11.5 - 15.5 % Final   Platelets 09/24/2021 311  150 - 400 K/uL Final   nRBC 09/24/2021 0.0  0.0 - 0.2 % Final   Neutrophils Relative % 09/24/2021 67  % Final   Neutro Abs 09/24/2021 6.1  1.7 - 7.7 K/uL Final   Lymphocytes Relative 09/24/2021 24  % Final   Lymphs Abs 09/24/2021 2.1  0.7 - 4.0 K/uL Final   Monocytes Relative 09/24/2021 7  % Final   Monocytes Absolute 09/24/2021 0.7  0.1 - 1.0 K/uL Final   Eosinophils Relative 09/24/2021 1  % Final   Eosinophils Absolute 09/24/2021 0.1  0.0 - 0.5 K/uL Final   Basophils Relative 09/24/2021 1  % Final   Basophils Absolute 09/24/2021 0.1  0.0 - 0.1 K/uL Final   Immature Granulocytes 09/24/2021 0  % Final   Abs Immature Granulocytes 09/24/2021 0.01  0.00 - 0.07 K/uL Final   Performed at [facility], [place] [address] [place], [place] [address]   Sodium 09/24/2021 139  135 - 145 mmol/L Final   Potassium 09/24/2021 4.2  3.5 - 5.1 mmol/L Final   Chloride 09/24/2021 102  98 - 111 mmol/L Final   CO2 09/24/2021 30  22 - 32 mmol/L Final   Glucose, Bld 09/24/2021 96  70 - 99 mg/dL Final   Glucose reference range applies only to samples taken after fasting for at least 8 hours.   BUN 09/24/2021 9  6 - 20 mg/dL Final   Creatinine, Ser 09/24/2021 0.78  0.61 - 1.24 mg/dL Final   Calcium 09/24/2021 10.0  8.9 - 10.3 mg/dL Final   Total Protein 09/24/2021  7.9  6.5 - 8.1 g/dL Final   Albumin 09/24/2021 4.6  3.5 - 5.0 g/dL Final   AST 09/24/2021 17  15 - 41 U/L Final   ALT 09/24/2021 25  0 - 44 U/L Final   Alkaline Phosphatase 09/24/2021 36 (L)  38 - 126 U/L Final   Total Bilirubin 09/24/2021 1.1  0.3 - 1.2 mg/dL Final   GFR, Estimated 09/24/2021 >60   >60 mL/min Final   Comment: (NOTE) Calculated using the CKD-EPI Creatinine Equation (2021)    Anion gap 09/24/2021 7  5 - 15 Final   Performed at [facility], [place] [address], [place], [place] [address]   Hgb A1c MFr Bld 09/24/2021 5.4  4.8 - 5.6 % Final   Comment: (NOTE) Pre diabetes:          5.7%-6.4%  Diabetes:              >6.4%  Glycemic control for   <7.0% adults with diabetes    Mean Plasma Glucose 09/24/2021 108.28  mg/dL Final   Performed at [facility], [place] [address], [place], [place] [address]   Alcohol, Ethyl (B) 09/24/2021 <10  <10 mg/dL Final   Comment: (NOTE) Lowest detectable limit for serum alcohol is 10 mg/dL.  For medical purposes only. Performed at [facility], [place] [address] [place], [place] [address]    Cholesterol 09/24/2021 136  0 - 200 mg/dL Final   Triglycerides 09/24/2021 31  <150 mg/dL Final   HDL 09/24/2021 49  >40 mg/dL Final   Total CHOL/HDL Ratio 09/24/2021 2.8  RATIO Final   VLDL 09/24/2021 6  0 - 40 mg/dL Final   LDL Cholesterol 09/24/2021 81  0 - 99 mg/dL Final   Comment:        Total Cholesterol/HDL:CHD Risk Coronary Heart Disease Risk Table                     Men   Women  1/2 Average Risk   3.4   3.3  Average Risk       5.0   4.4  2 X Average Risk   9.6   7.1  3 X Average Risk  23.4   11.0        Use the calculated Patient Ratio above and the CHD Risk Table to determine the patient's CHD Risk.        ATP III CLASSIFICATION (LDL):  <100     mg/dL   Optimal  100-129  mg/dL   Near or Above                    Optimal  130-159  mg/dL   Borderline  160-189  mg/dL   High  >190     mg/dL   Very High Performed at [facility] [address] [place], [place] [address]    TSH 09/24/2021 0.290 (L)  0.350 - 4.500 uIU/mL Final   Comment: Performed by a 3rd Generation assay with a functional sensitivity of <=0.01 uIU/mL. Performed at [facility], [place] [address], [place], [place] [address]     SARSCOV2ONAVIRUS 2 AG 09/25/2021 NEGATIVE  NEGATIVE Final   Comment: (NOTE) SARS-CoV-2 antigen NOT DETECTED.   Negative results are presumptive.  Negative results do not preclude SARS-CoV-2 infection and should not be used as the sole basis for treatment or other patient management decisions, including infection  control decisions, particularly in the presence  of clinical signs and  symptoms consistent with COVID-19, or in those who have been in contact with the virus.  Negative results must be combined with clinical observations, patient history, and epidemiological information. The expected result is Negative.  Fact Sheet for Patients: HandmadeRecipes.com.cy  Fact Sheet for Healthcare Providers: FuneralLife.at  This test is not yet approved or cleared by the [place] FDA and  has been authorized for detection and/or diagnosis of SARS-CoV-2 by FDA under an Emergency Use Authorization (EUA).  This EUA will remain in effect (meaning this test can be used) for the duration of  the COV                          ID-19 declaration under Section 564(b)(1) of the Act, 21 U.S.C. section 360bbb-3(b)(1), unless the authorization is terminated or revoked sooner.    Admission on 09/24/2021, Discharged on 09/24/2021  Component Date Value Ref Range Status   SARS Coronavirus 2 by RT PCR 09/24/2021 NEGATIVE  NEGATIVE Final   Comment: (NOTE) SARS-CoV-2 target nucleic acids are NOT DETECTED.  The SARS-CoV-2 RNA is generally detectable in upper and lower respiratory specimens during the acute phase of infection. The lowest concentration of SARS-CoV-2 viral copies this assay can detect is 250 copies / mL. A negative result does not preclude SARS-CoV-2 infection and should not be used as the sole basis for treatment or other patient management decisions.  A negative result may occur with improper specimen collection / handling, submission of  specimen other than nasopharyngeal swab, presence of viral mutation(s) within the areas targeted by this assay, and inadequate number of viral copies (<250 copies / mL). A negative result must be combined with clinical observations, patient history, and epidemiological information.  Fact Sheet for Patients:   https://www.patel.info/  Fact Sheet for Healthcare Providers: https://hall.com/  This test is not yet approved or                           cleared by the [place] FDA and has been authorized for detection and/or diagnosis of SARS-CoV-2 by FDA under an Emergency Use Authorization (EUA).  This EUA will remain in effect (meaning this test can be used) for the duration of the COVID-19 declaration under Section 564(b)(1) of the Act, 21 U.S.C. section 360bbb-3(b)(1), unless the authorization is terminated or revoked sooner.  Performed at [facility], [place] [address], [place], [place] [address]     Allergies: Patient has no known allergies.  PTA Medications: (Not in a hospital admission)   Medical Decision Making  Inpatient observation  Meds ordered this encounter  Medications   acetaminophen (TYLENOL) tablet 650 mg   alum & mag hydroxide-simeth (MAALOX/MYLANTA) 200-200-20 MG/5ML suspension 30 mL   magnesium hydroxide (MILK OF MAGNESIA) suspension 30 mL   hydrOXYzine (ATARAX) tablet 25 mg   traZODone (DESYREL) tablet 50 mg     Lab Orders         Resp Panel by RT-PCR (Flu A&B, Covid) Anterior Nasal Swab         CBC with Differential/Platelet         Comprehensive metabolic panel         Hemoglobin A1c         Ethanol         Lipid panel         TSH         POCT Urine Drug  Screen - (I-Screen)         POC SARS Coronavirus 2 Ag        Recommendations  Based on my evaluation the patient does not appear to have an emergency medical condition.  Sindy Guadeloupe, NP 09/25/21  5:05 AM

## 2021-09-24 NOTE — H&P (Signed)
Behavioral Health Medical Screening Exam  Jay Woods is a 21 y.o. male. Patient is a 21 years old male who presents to the hospital with disorganized thoughts, paranoia and substance use disorder.  Patient reports not feeling good physically and mentally. Patient states he took a lot of drugs and he is here to seek for help.  Patient rated his anxiety as 10/10, 10 being the worst anxiety ever.  States he has not slept for the past 24 hours.  Assessment: The patient appears very anxious, states people are out to get him.  Patient reported an sad /anxious mood and his Affect was somewhat labile and tearful.  Patient stated he was diagnosed with depression when he was 21 years old.  His thought process was somewhat disorganized with tangential association.  When the provider asked the patient how we can help him today he stated "I have faith and hope, my girlfriend in school, we used to do crystal meth and together (crying) and she passed away as she had a baby."  The patient started crying profusely.  The patient appears to be delusional about girls.  Reported having a girlfriend and when he sees other cars, he sees the girl's car spinning inside his head.  Pt. grooming and hygiene were fair, and his behavior was cooperative and calm.  The patient's speech was within normal limits for rate and volume. His communications were coherent but not goal directed.  The patient's eye contact was appropriate and fair. The patient's motor activity was unremarkable, with a normal gait. The Patient described his mood as Anxious and depressed.  His Affect was Labile with crying spells.  The Patient was oriented to person, place, time, and situation.  Attention and memory functions were assessed as grossly intact. The Patient did not appear to be attending to internal stimuli during the discussion.  Furthermore, the patient denied auditory hallucinations but endorse seeing shadows and denied feelings of  derealization and/or depersonalization.  The patient endorses paranoia, he feels people are out to get him.  The patient did not endorse or display any grandiose, religious delusions but appears to have delusions about girls. The patient denied current suicidal/homicidal ideation/plan/intent.     Past Psychiatric Hx: Intermittent explosive disorder, marijuana abuse. 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  Trauma and Abuse: Reports he has been to jail 3-4 times, charged with possession of stolen cars.  States his experience was traumatic.  Drug Use: Patient reported smoking marijuana when he was in the sixth grade.  He has experimented with different drugs which includes: Cocaine, K2, crystal meth, marijuana, CBD, black magic, ectasy, Roxy, Percocet, and Xanax.  Reports his last drug use was 3 days ago and his last alcohol was about 3 to 4 AM this morning.  Social Support: Reports he lives with his mom and sister.  Disposition: Based on my evaluation the patient appears to have an emergency Psychiatric condition for which I recommend in-Patient admission.   Total Time spent with patient: 1 hour  Psychiatric Specialty Exam:  Presentation  General Appearance: Appropriate for Environment; Fairly Groomed  Eye Contact:Fair  Speech:Normal Rate; Clear and Coherent  Speech Volume:Normal  Handedness:Right   Mood and Affect  Mood:Anxious; Labile  Affect:Labile; Tearful   Thought Process  Thought Processes:Disorganized  Descriptions of Associations:Tangential  Orientation:Full (Time, Place and Person)  Thought Content:Paranoid Ideation; Tangential  History of Schizophrenia/Schizoaffective disorder:Yes  Duration of Psychotic Symptoms:Greater than six months  Hallucinations:Hallucinations: Visual Description of Visual Hallucinations: Pt. reports seeing shadows  Ideas of Reference:Paranoia  Suicidal Thoughts:Suicidal Thoughts: No (Denied)  Homicidal Thoughts:Homicidal  Thoughts: No (Denied)   Sensorium  Memory:Immediate Fair  Judgment:Poor  Insight:Poor   Executive Functions  Concentration:Fair  Attention Span:Fair  Recall:Fair  Fund of Knowledge:Fair  Language:Good   Psychomotor Activity  Psychomotor Activity:Psychomotor Activity: Normal   Assets  Assets:Communication Skills; Desire for Improvement   Sleep  Sleep:Sleep: Poor Number of Hours of Sleep: 0    Physical Exam: Physical Exam Vitals reviewed.  Constitutional:      Appearance: Normal appearance.  HENT:     Nose: Nose normal.  Eyes:     Pupils: Pupils are equal, round, and reactive to light.  Cardiovascular:     Rate and Rhythm: Normal rate.  Pulmonary:     Effort: Pulmonary effort is normal.     Breath sounds: Normal breath sounds.  Skin:    General: Skin is warm and dry.  Neurological:     Mental Status: He is alert and oriented to person, place, and time.  Psychiatric:        Behavior: Behavior normal.    Review of Systems  Constitutional: Negative.  Negative for chills, diaphoresis, fever, malaise/fatigue and weight loss.  HENT: Negative.    Eyes: Negative.  Negative for blurred vision and double vision.  Respiratory: Negative.  Negative for cough.   Cardiovascular: Negative.  Negative for chest pain.  Gastrointestinal: Negative.  Negative for heartburn and nausea.  Genitourinary: Negative.   Musculoskeletal: Negative.   Neurological: Negative.  Negative for dizziness and headaches.  Psychiatric/Behavioral:  Positive for depression (Crying spells), hallucinations (reports seeing shadows), substance abuse (Yes, Substance use problems) and suicidal ideas (Denied). Negative for memory loss. The patient is nervous/anxious (reported 10/10 anxiety, 10 being the worst) and has insomnia (no sleep in the past 24 hrs).    Blood pressure (!) 138/90, pulse (!) 59, temperature 98 F (36.7 C), temperature source Oral, resp. rate 16, SpO2 100 %. There is no height  or weight on file to calculate BMI.  Musculoskeletal: Strength & Muscle Tone: within normal limits Gait & Station: normal Patient leans: N/A  [place] Scale:  Flowsheet Row [facility]) [facility] from [facility]09/2020 [facility]  C-SSRS RISK CATEGORY No Risk No Risk No Risk       Recommendations: Inpatient admission at [facility], NP 09/24/2021, 4:16 PM

## 2021-09-25 LAB — COMPREHENSIVE METABOLIC PANEL
ALT: 25 U/L (ref 0–44)
AST: 17 U/L (ref 15–41)
Albumin: 4.6 g/dL (ref 3.5–5.0)
Alkaline Phosphatase: 36 U/L — ABNORMAL LOW (ref 38–126)
Anion gap: 7 (ref 5–15)
BUN: 9 mg/dL (ref 6–20)
CO2: 30 mmol/L (ref 22–32)
Calcium: 10 mg/dL (ref 8.9–10.3)
Chloride: 102 mmol/L (ref 98–111)
Creatinine, Ser: 0.78 mg/dL (ref 0.61–1.24)
GFR, Estimated: >60 mL/min (ref >60)
Glucose, Bld: 96 mg/dL (ref 70–99)
Potassium: 4.2 mmol/L (ref 3.5–5.1)
Sodium: 139 mmol/L (ref 135–145)
Total Bilirubin: 1.1 mg/dL (ref 0.3–1.2)
Total Protein: 7.9 g/dL (ref 6.5–8.1)

## 2021-09-25 LAB — ETHANOL: Alcohol, Ethyl (B): <10 mg/dL (ref <10)

## 2021-09-25 LAB — CBC WITH DIFFERENTIAL/PLATELET
Abs Immature Granulocytes: 0.01 10*3/uL (ref 0.00–0.07)
Basophils Absolute: 0.1 10*3/uL (ref 0.0–0.1)
Basophils Relative: 1 %
Eosinophils Absolute: 0.1 10*3/uL (ref 0.0–0.5)
Eosinophils Relative: 1 %
HCT: 45.3 % (ref 39.0–52.0)
Hemoglobin: 15.5 g/dL (ref 13.0–17.0)
Immature Granulocytes: 0 %
Lymphocytes Relative: 24 %
Lymphs Abs: 2.1 10*3/uL (ref 0.7–4.0)
MCH: 30.9 pg (ref 26.0–34.0)
MCHC: 34.2 g/dL (ref 30.0–36.0)
MCV: 90.4 fL (ref 80.0–100.0)
Monocytes Absolute: 0.7 10*3/uL (ref 0.1–1.0)
Monocytes Relative: 7 %
Neutro Abs: 6.1 10*3/uL (ref 1.7–7.7)
Neutrophils Relative %: 67 %
Platelets: 311 10*3/uL (ref 150–400)
RBC: 5.01 MIL/uL (ref 4.22–5.81)
RDW: 12.7 % (ref 11.5–15.5)
WBC: 9 10*3/uL (ref 4.0–10.5)
nRBC: 0 % (ref 0.0–0.2)

## 2021-09-25 LAB — POCT URINE DRUG SCREEN - MANUAL ENTRY (I-SCREEN)
POC Amphetamine UR: NOT DETECTED
POC Buprenorphine (BUP): NOT DETECTED
POC Cocaine UR: NOT DETECTED
POC Marijuana UR: POSITIVE — AB
POC Methadone UR: NOT DETECTED
POC Methamphetamine UR: NOT DETECTED
POC Morphine: NOT DETECTED
POC Oxazepam (BZO): NOT DETECTED
POC Oxycodone UR: NOT DETECTED
POC Secobarbital (BAR): NOT DETECTED

## 2021-09-25 LAB — LIPID PANEL
Cholesterol: 136 mg/dL (ref 0–200)
HDL: 49 mg/dL (ref >40)
LDL Cholesterol: 81 mg/dL (ref 0–99)
Total CHOL/HDL Ratio: 2.8 RATIO
Triglycerides: 31 mg/dL (ref <150)
VLDL: 6 mg/dL (ref 0–40)

## 2021-09-25 LAB — POC SARS CORONAVIRUS 2 AG: SARSCOV2ONAVIRUS 2 AG: NEGATIVE

## 2021-09-25 LAB — RESP PANEL BY RT-PCR (FLU A&B, COVID) ARPGX2
Influenza A by PCR: NEGATIVE
Influenza B by PCR: NEGATIVE
SARS Coronavirus 2 by RT PCR: NEGATIVE

## 2021-09-25 LAB — HEMOGLOBIN A1C
Hgb A1c MFr Bld: 5.4 % (ref 4.8–5.6)
Mean Plasma Glucose: 108.28 mg/dL

## 2021-09-25 LAB — T4, FREE: Free T4: 0.95 ng/dL (ref 0.61–1.12)

## 2021-09-25 LAB — TSH: TSH: 0.29 u[IU]/mL — ABNORMAL LOW (ref 0.350–4.500)

## 2021-09-25 MED ORDER — DIVALPROEX SODIUM 250 MG PO DR TAB
750.0000 mg | DELAYED_RELEASE_TABLET | Freq: Two times a day (BID) | ORAL | Status: AC
Start: 2021-09-25 — End: 2021-09-25
  Administered 2021-09-25 (×2): 750 mg via ORAL
  Filled 2021-09-25 (×2): qty 3

## 2021-09-25 MED ORDER — DIVALPROEX SODIUM 250 MG PO DR TAB: 750.0000 mg | DELAYED_RELEASE_TABLET | Freq: Every day | ORAL | Status: DC

## 2021-09-25 MED ORDER — ZIPRASIDONE MESYLATE 20 MG IM SOLR: 20.0000 mg | INTRAMUSCULAR | Status: DC | PRN

## 2021-09-25 MED ORDER — OLANZAPINE 10 MG PO TABS
10.0000 mg | ORAL_TABLET | Freq: Every day | ORAL | Status: DC
  Administered 2021-09-25: 10 mg via ORAL
  Filled 2021-09-25: qty 1

## 2021-09-25 MED ORDER — DIVALPROEX SODIUM 500 MG PO DR TAB
500.0000 mg | DELAYED_RELEASE_TABLET | Freq: Every day | ORAL | Status: DC
Start: 2021-09-26 — End: 2021-09-26

## 2021-09-25 MED ORDER — HYDROXYZINE HCL 25 MG PO TABS
25.0000 mg | ORAL_TABLET | Freq: Once | ORAL | Status: AC
  Administered 2021-09-25: 25 mg via ORAL
  Filled 2021-09-25: qty 1

## 2021-09-25 MED ORDER — TRAZODONE HCL 50 MG PO TABS
50.0000 mg | ORAL_TABLET | Freq: Every evening | ORAL | Status: DC | PRN
  Administered 2021-09-25: 50 mg via ORAL
  Filled 2021-09-25: qty 1

## 2021-09-25 MED ORDER — OLANZAPINE 5 MG PO TBDP
5.0000 mg | ORAL_TABLET | Freq: Three times a day (TID) | ORAL | Status: DC | PRN
  Administered 2021-09-25: 5 mg via ORAL
  Filled 2021-09-25: qty 1

## 2021-09-25 MED ORDER — LORAZEPAM 1 MG PO TABS: 1.0000 mg | ORAL_TABLET | ORAL | Status: DC | PRN

## 2021-09-25 MED ORDER — OLANZAPINE 5 MG PO TABS
5.0000 mg | ORAL_TABLET | Freq: Every day | ORAL | Status: AC
  Administered 2021-09-25: 5 mg via ORAL
  Filled 2021-09-25: qty 1

## 2021-09-25 NOTE — ED Provider Notes (Signed)
Behavioral Health Progress Note  Date and Time: 09/25/2021 9:08 AM Name: Jay Woods MRN:  [id number]  Subjective:   [name] is a 21 year old male with a PPHx of schizophrenia (with multiple previous admissions for psychotic episodes) and multiple substance use disorders. He presented voluntarily to the ED with paranoia and disorganized thoughts and was moved to [facility]. He is currently here voluntarily.   On assessment this morning, the patient appears manic and psychotic. He exhibits hyperverbal/pressured speech as well psychomotor agitation. He is cooperative and tearful, speaking in a mix of [place] and [place]. When asked what brought him into the ED/[facility], he states "I'm mixed [race] and they're after me". He exhibits further paranoia about a woman he saw in the observation area. He denies suicidal thoughts and homicidal thoughts.   Diagnosis:  Final diagnoses:  Polysubstance abuse (HCC)  Anxious appearance    Total Time spent with patient: 20 minutes  Past Psychiatric History: as above Past Medical History:  Past Medical History:  Diagnosis Date   Eczema    No past surgical history on file. Family History: No family history on file. Family Psychiatric  History: as per H and P Social History:  Social History   Substance and Sexual Activity  Alcohol Use Yes   Alcohol/week: 1.0 standard drink of alcohol   Types: 1 Cans of beer per week   Comment: twice/week     Social History   Substance and Sexual Activity  Drug Use Yes   Types: Marijuana    Social History   Socioeconomic History   Marital status: Single    Spouse name: Not on file   Number of children: Not on file   Years of education: Not on file   Highest education level: Not on file  Occupational History   Not on file  Tobacco Use   Smoking status: Former    Packs/day: 1.00    Years: 5.00    Total pack years: 5.00    Types: Cigarettes   Smokeless tobacco: Former  Haematologist Use: Some days   Substances: CBD  Substance and Sexual Activity   Alcohol use: Yes    Alcohol/week: 1.0 standard drink of alcohol    Types: 1 Cans of beer per week    Comment: twice/week   Drug use: Yes    Types: Marijuana   Sexual activity: Not Currently  Other Topics Concern   Not on file  Social History Narrative   Not on file   Social Determinants of Health   Financial Resource Strain: Not on file  Food Insecurity: Not on file  Transportation Needs: Not on file  Physical Activity: Not on file  Stress: Not on file  Social Connections: Not on file   SDOH:  SDOH Screenings   Alcohol Screen: Low Risk  (06/10/2020)   Alcohol Screen    Last Alcohol Screening Score (AUDIT): 3  Depression (PHQ2-9): Not on file  Financial Resource Strain: Not on file  Food Insecurity: Not on file  Housing: Not on file  Physical Activity: Not on file  Social Connections: Not on file  Stress: Not on file  Tobacco Use: Medium Risk (06/10/2020)   Patient History    Smoking Tobacco Use: Former    Smokeless Tobacco Use: Former    Passive Exposure: Not on Chartered certified accountant Needs: Not on file   Additional Social History:  Sleep: Poor  Appetite:  Fair  Current Medications:  Current Facility-Administered Medications  Medication Dose Route Frequency Provider Last Rate Last Admin   acetaminophen (TYLENOL) tablet 650 mg  650 mg Oral Q6H PRN Sindy Guadeloupe, NP       alum & mag hydroxide-simeth (MAALOX/MYLANTA) 200-200-20 MG/5ML suspension 30 mL  30 mL Oral Q4H PRN Sindy Guadeloupe, NP       divalproex (DEPAKOTE) DR tablet 750 mg  750 mg Oral Q12H Carlyn Reichert, MD       OLANZapine zydis [facility]) disintegrating tablet 5 mg  5 mg Oral Q8H PRN Carlyn Reichert, MD   5 mg at 09/25/21 3474   And   LORazepam (ATIVAN) tablet 1 mg  1 mg Oral PRN Carlyn Reichert, MD       And   ziprasidone (GEODON) injection 20 mg  20 mg Intramuscular PRN Carlyn Reichert, MD        magnesium hydroxide (MILK OF MAGNESIA) suspension 30 mL  30 mL Oral Daily PRN Sindy Guadeloupe, NP       OLANZapine (ZYPREXA) tablet 5 mg  5 mg Oral Daily Carlyn Reichert, MD       traZODone (DESYREL) tablet 50 mg  50 mg Oral QHS PRN Sindy Guadeloupe, NP   50 mg at 09/25/21 0044   Current Outpatient Medications  Medication Sig Dispense Refill   benztropine (COGENTIN) 1 MG tablet Take 1 tablet (1 mg total) by mouth 2 (two) times daily as needed for tremors (EPS reaction). 60 tablet 0   divalproex (DEPAKOTE ER) 500 MG 24 hr tablet Take 3 tablets (1,500 mg total) by mouth at bedtime. For mood stabilization 90 tablet 0   loratadine (CLARITIN) 10 MG tablet Take 1 tablet (10 mg total) by mouth daily. (May buy from over the counter): For allergies     metoprolol tartrate (LOPRESSOR) 25 MG tablet Take 1 tablet (25 mg total) by mouth 2 (two) times daily. For high blood pressure 60 tablet 0   nicotine polacrilex (NICORETTE) 2 MG gum Take 1 each (2 mg total) by mouth as needed. (May smoking cessation): For nicotine withdrawal 1 tablet 0   risperiDONE (RISPERDAL) 2 MG tablet Take 1 tablet (2 mg) by mouth in am & 2 tablets (4 mg) at bedtime: For mood control 90 tablet 0   traZODone (DESYREL) 50 MG tablet Take 1 tablet (50 mg total) by mouth at bedtime as needed for sleep. 30 tablet 0    Labs  Lab Results:  Admission on 09/24/2021  Component Date Value Ref Range Status   SARS Coronavirus 2 by RT PCR 09/24/2021 NEGATIVE  NEGATIVE Final   Comment: (NOTE) SARS-CoV-2 target nucleic acids are NOT DETECTED.  The SARS-CoV-2 RNA is generally detectable in upper respiratory specimens during the acute phase of infection. The lowest concentration of SARS-CoV-2 viral copies this assay can detect is 138 copies/mL. A negative result does not preclude SARS-Cov-2 infection and should not be used as the sole basis for treatment or other patient management decisions. A negative result may occur with  improper specimen  collection/handling, submission of specimen other than nasopharyngeal swab, presence of viral mutation(s) within the areas targeted by this assay, and inadequate number of viral copies(<138 copies/mL). A negative result must be combined with clinical observations, patient history, and epidemiological information. The expected result is Negative.  Fact Sheet for Patients:  BloggerCourse.com  Fact Sheet for Healthcare Providers:  SeriousBroker.it  This test is no  t yet approved or cleared by the [place] FDA and  has been authorized for detection and/or diagnosis of SARS-CoV-2 by FDA under an Emergency Use Authorization (EUA). This EUA will remain  in effect (meaning this test can be used) for the duration of the COVID-19 declaration under Section 564(b)(1) of the Act, 21 U.S.C.section 360bbb-3(b)(1), unless the authorization is terminated  or revoked sooner.       Influenza A by PCR 09/24/2021 NEGATIVE  NEGATIVE Final   Influenza B by PCR 09/24/2021 NEGATIVE  NEGATIVE Final   Comment: (NOTE) The Xpert Xpress SARS-CoV-2/FLU/RSV plus assay is intended as an aid in the diagnosis of influenza from Nasopharyngeal swab specimens and should not be used as a sole basis for treatment. Nasal washings and aspirates are unacceptable for Xpert Xpress SARS-CoV-2/FLU/RSV testing.  Fact Sheet for Patients: BloggerCourse.comhttps://www.fda.gov/media/152166/download  Fact Sheet for Healthcare Providers: SeriousBroker.ithttps://www.fda.gov/media/152162/download  This test is not yet approved or cleared by the [place] FDA and has been authorized for detection and/or diagnosis of SARS-CoV-2 by FDA under an Emergency Use Authorization (EUA). This EUA will remain in effect (meaning this test can be used) for the duration of the COVID-19 declaration under Section 564(b)(1) of the Act, 21 U.S.C. section 360bbb-3(b)(1), unless the authorization is  terminated or revoked.  Performed at [facility], [address] [place], [place] [address]    WBC 09/24/2021 9.0  4.0 - 10.5 K/uL Final   RBC 09/24/2021 5.01  4.22 - 5.81 MIL/uL Final   Hemoglobin 09/24/2021 15.5  13.0 - 17.0 g/dL Final   HCT 60/45/409808/03/2021 45.3  39.0 - 52.0 % Final   MCV 09/24/2021 90.4  80.0 - 100.0 fL Final   MCH 09/24/2021 30.9  26.0 - 34.0 pg Final   MCHC 09/24/2021 34.2  30.0 - 36.0 g/dL Final   RDW 11/91/478208/03/2021 12.7  11.5 - 15.5 % Final   Platelets 09/24/2021 311  150 - 400 K/uL Final   nRBC 09/24/2021 0.0  0.0 - 0.2 % Final   Neutrophils Relative % 09/24/2021 67  % Final   Neutro Abs 09/24/2021 6.1  1.7 - 7.7 K/uL Final   Lymphocytes Relative 09/24/2021 24  % Final   Lymphs Abs 09/24/2021 2.1  0.7 - 4.0 K/uL Final   Monocytes Relative 09/24/2021 7  % Final   Monocytes Absolute 09/24/2021 0.7  0.1 - 1.0 K/uL Final   Eosinophils Relative 09/24/2021 1  % Final   Eosinophils Absolute 09/24/2021 0.1  0.0 - 0.5 K/uL Final   Basophils Relative 09/24/2021 1  % Final   Basophils Absolute 09/24/2021 0.1  0.0 - 0.1 K/uL Final   Immature Granulocytes 09/24/2021 0  % Final   Abs Immature Granulocytes 09/24/2021 0.01  0.00 - 0.07 K/uL Final   Performed at [facility], [address] [place], [place] [address]   Sodium 09/24/2021 139  135 - 145 mmol/L Final   Potassium 09/24/2021 4.2  3.5 - 5.1 mmol/L Final   Chloride 09/24/2021 102  98 - 111 mmol/L Final   CO2 09/24/2021 30  22 - 32 mmol/L Final   Glucose, Bld 09/24/2021 96  70 - 99 mg/dL Final   Glucose reference range applies only to samples taken after fasting for at least 8 hours.   BUN 09/24/2021 9  6 - 20 mg/dL Final   Creatinine, Ser 09/24/2021 0.78  0.61 - 1.24 mg/dL Final   Calcium 30/86/578408/03/2021 10.0  8.9 - 10.3 mg/dL Final   Total Protein  09/24/2021 7.9  6.5 - 8.1 g/dL Final   Albumin 17/00/1749 4.6  3.5 - 5.0 g/dL Final   AST 44/96/7591 17  15 - 41 U/L Final   ALT 09/24/2021 25  0 - 44 U/L Final    Alkaline Phosphatase 09/24/2021 36 (L)  38 - 126 U/L Final   Total Bilirubin 09/24/2021 1.1  0.3 - 1.2 mg/dL Final   GFR, Estimated 09/24/2021 >60  >60 mL/min Final   Comment: (NOTE) Calculated using the CKD-EPI Creatinine Equation (2021)    Anion gap 09/24/2021 7  5 - 15 Final   Performed at [facility], [address], [place], [place] [address]   Hgb A1c MFr Bld 09/24/2021 5.4  4.8 - 5.6 % Final   Comment: (NOTE) Pre diabetes:          5.7%-6.4%  Diabetes:              >6.4%  Glycemic control for   <7.0% adults with diabetes    Mean Plasma Glucose 09/24/2021 108.28  mg/dL Final   Performed at [facility], [address] [place], [place] [address]   Alcohol, Ethyl (B) 09/24/2021 <10  <10 mg/dL Final   Comment: (NOTE) Lowest detectable limit for serum alcohol is 10 mg/dL.  For medical purposes only. Performed at [facility], [address] [place], [place] [address]    Cholesterol 09/24/2021 136  0 - 200 mg/dL Final   Triglycerides 79/39/0300 31  <150 mg/dL Final   HDL 92/33/0076 49  >40 mg/dL Final   Total CHOL/HDL Ratio 09/24/2021 2.8  RATIO Final   VLDL 09/24/2021 6  0 - 40 mg/dL Final   LDL Cholesterol 09/24/2021 81  0 - 99 mg/dL Final   Comment:        Total Cholesterol/HDL:CHD Risk Coronary Heart Disease Risk Table                     Men   Women  1/2 Average Risk   3.4   3.3  Average Risk       5.0   4.4  2 X Average Risk   9.6   7.1  3 X Average Risk  23.4   11.0        Use the calculated Patient Ratio above and the CHD Risk Table to determine the patient's CHD Risk.        ATP III CLASSIFICATION (LDL):  <100     mg/dL   Optimal  226-333  mg/dL   Near or Above                    Optimal  130-159  mg/dL   Borderline  545-625  mg/dL   High  >638     mg/dL   Very High Performed at [facility], [address], [place], [place] [address]    TSH 09/24/2021 0.290 (L)  0.350 - 4.500 uIU/mL Final   Comment: Performed by a 3rd  Generation assay with a functional sensitivity of <=0.01 uIU/mL. Performed at [facility], [address], [place], [place] [address]    POC Amphetamine UR 09/25/2021 None Detected  NONE DETECTED (Cut Off Level 1000 ng/mL) Final   POC Secobarbital (BAR) 09/25/2021 None Detected  NONE DETECTED (Cut Off Level 300 ng/mL) Final   POC Buprenorphine (BUP) 09/25/2021 None Detected  NONE DETECTED (Cut Off Level 10 ng/mL) Final   POC Oxazepam (BZO)  09/25/2021 None Detected  NONE DETECTED (Cut Off Level 300 ng/mL) Final   POC Cocaine UR 09/25/2021 None Detected  NONE DETECTED (Cut Off Level 300 ng/mL) Final   POC Methamphetamine UR 09/25/2021 None Detected  NONE DETECTED (Cut Off Level 1000 ng/mL) Final   POC Morphine 09/25/2021 None Detected  NONE DETECTED (Cut Off Level 300 ng/mL) Final   POC Methadone UR 09/25/2021 None Detected  NONE DETECTED (Cut Off Level 300 ng/mL) Final   POC Oxycodone UR 09/25/2021 None Detected  NONE DETECTED (Cut Off Level 100 ng/mL) Final   POC Marijuana UR 09/25/2021 Positive (A)  NONE DETECTED (Cut Off Level 50 ng/mL) Final   SARSCOV2ONAVIRUS 2 AG 09/25/2021 NEGATIVE  NEGATIVE Final   Comment: (NOTE) SARS-CoV-2 antigen NOT DETECTED.   Negative results are presumptive.  Negative results do not preclude SARS-CoV-2 infection and should not be used as the sole basis for treatment or other patient management decisions, including infection  control decisions, particularly in the presence of clinical signs and  symptoms consistent with COVID-19, or in those who have been in contact with the virus.  Negative results must be combined with clinical observations, patient history, and epidemiological information. The expected result is Negative.  Fact Sheet for Patients: https://www.jennings-kim.com/  Fact Sheet for Healthcare Providers: https://alexander-rogers.biz/  This test is not yet approved or cleared by the [place] FDA and  has  been authorized for detection and/or diagnosis of SARS-CoV-2 by FDA under an Emergency Use Authorization (EUA).  This EUA will remain in effect (meaning this test can be used) for the duration of  the COV                          ID-19 declaration under Section 564(b)(1) of the Act, 21 U.S.C. section 360bbb-3(b)(1), unless the authorization is terminated or revoked sooner.    Admission on 09/24/2021, Discharged on 09/24/2021  Component Date Value Ref Range Status   SARS Coronavirus 2 by RT PCR 09/24/2021 NEGATIVE  NEGATIVE Final   Comment: (NOTE) SARS-CoV-2 target nucleic acids are NOT DETECTED.  The SARS-CoV-2 RNA is generally detectable in upper and lower respiratory specimens during the acute phase of infection. The lowest concentration of SARS-CoV-2 viral copies this assay can detect is 250 copies / mL. A negative result does not preclude SARS-CoV-2 infection and should not be used as the sole basis for treatment or other patient management decisions.  A negative result may occur with improper specimen collection / handling, submission of specimen other than nasopharyngeal swab, presence of viral mutation(s) within the areas targeted by this assay, and inadequate number of viral copies (<250 copies / mL). A negative result must be combined with clinical observations, patient history, and epidemiological information.  Fact Sheet for Patients:   RoadLapTop.co.za  Fact Sheet for Healthcare Providers: http://kim-miller.com/  This test is not yet approved or                           cleared by the [place] FDA and has been authorized for detection and/or diagnosis of SARS-CoV-2 by FDA under an Emergency Use Authorization (EUA).  This EUA will remain in effect (meaning this test can be used) for the duration of the COVID-19 declaration under Section 564(b)(1) of the Act, 21 U.S.C. section 360bbb-3(b)(1), unless the authorization  is terminated or revoked sooner.  Performed at [facility], [address] [place], [place] [address]  Blood Alcohol level:  Lab Results  Component Value Date   ETH <10 09/24/2021   ETH <10 06/09/2020    Metabolic Disorder Labs: Lab Results  Component Value Date   HGBA1C 5.4 09/24/2021   MPG 108.28 09/24/2021   MPG 114 06/09/2020   Lab Results  Component Value Date   PROLACTIN 4.4 06/09/2020   Lab Results  Component Value Date   CHOL 136 09/24/2021   TRIG 31 09/24/2021   HDL 49 09/24/2021   CHOLHDL 2.8 09/24/2021   VLDL 6 09/24/2021   LDLCALC 81 09/24/2021   LDLCALC 81 06/09/2020    Therapeutic Lab Levels: No results found for: "LITHIUM" Lab Results  Component Value Date   VALPROATE 67 06/25/2020   VALPROATE 58 06/21/2020   No results found for: "CBMZ"  Physical Findings   AIMS    Flowsheet Row [facility]) [facility] (Discharged[facility] Ad[facility] (Discharged[facility]  AIMS Total Score 0 0 0      AUDIT    Flowsheet Row Ad[facility]) [facility] Admission (Discharged) [facility] Ad[facility] (Discharged) [facility]  Alcohol Use Disorder Identification Test Final Score (AUDIT) 3 6 0      Flowsheet Row ED from 09/24/2021 in [facility] Most recent reading at 09/25/2021 12:19 AM Admission (Discharged) from OP [facility] Most recent reading at 09/24/2021  4:12 PM Admission (Discharged[facility] Most recent reading at 06/10/2020  6:00 PM  C-SSRS RISK  CATEGORY No Risk No Risk No Risk        Musculoskeletal  Strength & Muscle Tone: within normal limits Gait & Station: normal Patient leans: N/A  Psychiatric Specialty Exam  Presentation  General Appearance: Appropriate for Environment  Eye Contact:Fair  Speech:Pressured  Speech Volume:Normal  Handedness:Ambidextrous   Mood and Affect  Mood:Labile; Dysphoric  Affect:Labile   Thought Process  Thought Processes:Disorganized  Descriptions of Associations:Loose  Orientation:Full (Time, Place and Person)  Thought Content:Illogical  Diagnosis of Schizophrenia or Schizoaffective disorder in past: Yes  Duration of Psychotic Symptoms: Greater than six months   Hallucinations: none Ideas of Reference:None  Suicidal Thoughts:Suicidal Thoughts: No  Homicidal Thoughts:Homicidal Thoughts: No   Sensorium  Memory:Immediate Fair; Recent Fair; Remote Fair  Judgment:Poor  Insight:Poor   Executive Functions  Concentration:Fair  Attention Span:Fair  Recall:Fair  Fund of Knowledge:Fair  Language:Fair   Psychomotor Activity  Psychomotor Activity:Psychomotor Activity: Normal   Assets  Assets:Desire for Improvement; Resilience   Sleep  Sleep:Sleep: Poor Number of Hours of Sleep: 0   Nutritional Assessment (For [facility] and [facility] admissions only) Has the patient had a weight loss or gain of 10 pounds or more in the last 3 months?: No Has the patient had a decrease in food intake/or appetite?: No Does the patient have dental problems?: No Does the patient have eating habits or behaviors that may be indicators of an eating disorder including binging or inducing vomiting?: No Has the patient recently lost weight without trying?: 0 Has the patient been eating poorly because of a decreased appetite?: 0 Malnutrition Screening Tool Score: 0    Physical Exam  Physical Exam Constitutional:  Appearance: the patient is not toxic-appearing.  Pulmonary:      Effort: Pulmonary effort is normal.  Neurological:     General: No focal deficit present.     Mental Status: the patient is alert and oriented to person, place, and time.   Review of Systems  Respiratory:  Negative for shortness of breath.   Cardiovascular:  Negative for chest pain.  Gastrointestinal:  Negative for abdominal pain, constipation, diarrhea, nausea and vomiting.  Neurological:  Negative for headaches.   Blood pressure 130/85, pulse 76, temperature 98.2 F (36.8 C), temperature source Oral, resp. rate 18, SpO2 100 %. There is no height or weight on file to calculate BMI.  Treatment Plan Summary: Daily contact with patient to assess and evaluate symptoms and progress in treatment and Medication management   Status: voluntary Recommend inpatient placement, will need thought disorder bed  #Psychosis/Mania -Depakote 750 mg BID today (patient previously was discharged on 1,500 mg QHS) for mania (started 8/3)  -AST/ALT nml, plts nml  -Will need VPA level on 8/6 AM -Zyprexa 5 mg now for psychosis -Zyprexa 10 mg QHS tonight for psychosis  Labs: UDS with MJ TSH of 0.29 (ordered T3/T4) EKG ordered  Carlyn Reichert, MD 09/25/2021 9:08 AM

## 2021-09-25 NOTE — ED Notes (Signed)
[facility] requested,  Driver on the way to [place], will call with update for transport to [facility].

## 2021-09-25 NOTE — ED Notes (Signed)
Pt up and down from bed singing and dancing.

## 2021-09-25 NOTE — ED Notes (Addendum)
Pt admitted to obs due to psychosis, substance use, and anxiety. Pt is alert with disorganized thoughts and paranoia. Pt appears anxious and is tearful at times. Pt is cooperative, very appreciative and pleasant, and refers to staff as "superheroes." Pt often states "oh my Lord" and makes religious gestures with his hands. Pt tolerated lab work and skin assessment well. Pt ambulated independently to unit. Oriented to unit/staff. Pt washed his hands multiple times while singing the ABCs, then states he feels like he needs to wash his hands more. Unable to provide urine sample at this time; UDS cup at bedside. Sandwich, chips, and juice given per pt request. No signs of acute distress noted. Will continue to monitor for safety.

## 2021-09-25 NOTE — ED Notes (Addendum)
Pt up to shower. Continues to sing out loud and can be heard yelling out "oh Lord, oh Jesus, oh my God" from shower.

## 2021-09-25 NOTE — ED Notes (Signed)
Pt pulled the fire alarm, and has called 911.

## 2021-09-25 NOTE — ED Notes (Signed)
Report called to RN Veronique, [facility] rm [phone]  Pending [facility].

## 2021-09-25 NOTE — ED Notes (Signed)
Pt in shower.  

## 2021-09-25 NOTE — ED Notes (Addendum)
Pt continues to appear anxious, is fidgeting around in bed and saying "oh Lord" repeatedly. Pt states that he is fine when staff check on him. Pt up to bathroom without difficulty then went to lay back down. Sindy Guadeloupe, NP notified and no further orders received at this time.

## 2021-09-25 NOTE — ED Notes (Signed)
Patient is manic this morning. Patient has intermittent crying spells.Patient denies SI, HI and AVH. Routine safety checks conducted according to facility protocol. Will continue to monitor for safety.  Encouragement and support provided.

## 2021-09-25 NOTE — ED Notes (Signed)
Pt laying in bed, talking to self. Cookies provided per pt request. No signs of acute distress noted. Will continue to monitor for safety.

## 2021-09-25 NOTE — ED Notes (Signed)
Patient resting quietly in bed with eyes open, Respirations equal and unlabored, skin warm and dry, NAD. No change in assessment or acuity. Routine safety checks conducted according to facility protocol. Will continue to monitor for safety.   

## 2021-09-26 ENCOUNTER — Encounter (HOSPITAL_COMMUNITY): Payer: Self-pay

## 2021-09-26 ENCOUNTER — Inpatient Hospital Stay (HOSPITAL_COMMUNITY)
Admission: AD | Admit: 2021-09-26 | Discharge: 2021-10-02 | DRG: 885 | Disposition: A | Discharge status: Home / Self Care | Payer: Federal, State, Local not specified - Other | Source: Intra-hospital | Attending: Psychiatry | Admitting: Psychiatry

## 2021-09-26 ENCOUNTER — Encounter (HOSPITAL_COMMUNITY): Payer: Self-pay | Admitting: Psychiatry

## 2021-09-26 ENCOUNTER — Other Ambulatory Visit: Payer: Self-pay

## 2021-09-26 DIAGNOSIS — I10 Essential (primary) hypertension: Secondary | ICD-10-CM | POA: Diagnosis present

## 2021-09-26 DIAGNOSIS — F419 Anxiety disorder, unspecified: Secondary | ICD-10-CM | POA: Diagnosis present

## 2021-09-26 DIAGNOSIS — Z560 Unemployment, unspecified: Secondary | ICD-10-CM

## 2021-09-26 DIAGNOSIS — F1210 Cannabis abuse, uncomplicated: Secondary | ICD-10-CM | POA: Diagnosis present

## 2021-09-26 DIAGNOSIS — F250 Schizoaffective disorder, bipolar type: Secondary | ICD-10-CM | POA: Diagnosis present

## 2021-09-26 DIAGNOSIS — Z91410 Personal history of adult physical and sexual abuse: Secondary | ICD-10-CM

## 2021-09-26 DIAGNOSIS — F1910 Other psychoactive substance abuse, uncomplicated: Secondary | ICD-10-CM | POA: Insufficient documentation

## 2021-09-26 DIAGNOSIS — F17290 Nicotine dependence, other tobacco product, uncomplicated: Secondary | ICD-10-CM | POA: Diagnosis present

## 2021-09-26 DIAGNOSIS — L309 Dermatitis, unspecified: Secondary | ICD-10-CM | POA: Diagnosis present

## 2021-09-26 DIAGNOSIS — F17200 Nicotine dependence, unspecified, uncomplicated: Secondary | ICD-10-CM | POA: Diagnosis present

## 2021-09-26 DIAGNOSIS — R350 Frequency of micturition: Secondary | ICD-10-CM | POA: Diagnosis present

## 2021-09-26 DIAGNOSIS — F1510 Other stimulant abuse, uncomplicated: Secondary | ICD-10-CM | POA: Diagnosis present

## 2021-09-26 DIAGNOSIS — Z20822 Contact with and (suspected) exposure to covid-19: Secondary | ICD-10-CM | POA: Diagnosis present

## 2021-09-26 DIAGNOSIS — Z91148 Patient's other noncompliance with medication regimen for other reason: Secondary | ICD-10-CM | POA: Diagnosis not present

## 2021-09-26 DIAGNOSIS — F1511 Other stimulant abuse, in remission: Secondary | ICD-10-CM | POA: Diagnosis present

## 2021-09-26 DIAGNOSIS — F1010 Alcohol abuse, uncomplicated: Secondary | ICD-10-CM | POA: Diagnosis present

## 2021-09-26 DIAGNOSIS — F1320 Sedative, hypnotic or anxiolytic dependence, uncomplicated: Secondary | ICD-10-CM | POA: Diagnosis present

## 2021-09-26 DIAGNOSIS — R4586 Emotional lability: Secondary | ICD-10-CM | POA: Diagnosis present

## 2021-09-26 DIAGNOSIS — F1221 Cannabis dependence, in remission: Secondary | ICD-10-CM | POA: Diagnosis present

## 2021-09-26 DIAGNOSIS — G479 Sleep disorder, unspecified: Secondary | ICD-10-CM | POA: Diagnosis not present

## 2021-09-26 DIAGNOSIS — E0590 Thyrotoxicosis, unspecified without thyrotoxic crisis or storm: Secondary | ICD-10-CM | POA: Diagnosis present

## 2021-09-26 DIAGNOSIS — F1321 Sedative, hypnotic or anxiolytic dependence, in remission: Secondary | ICD-10-CM | POA: Diagnosis present

## 2021-09-26 MED ORDER — RISPERIDONE 1 MG PO TABS
1.0000 mg | ORAL_TABLET | Freq: Two times a day (BID) | ORAL | Status: DC
  Administered 2021-09-26 – 2021-09-27 (×2): 1 mg via ORAL
  Filled 2021-09-26 (×6): qty 1

## 2021-09-26 MED ORDER — ALUM & MAG HYDROXIDE-SIMETH 200-200-20 MG/5ML PO SUSP: 30.0000 mL | ORAL | Status: DC | PRN

## 2021-09-26 MED ORDER — DIVALPROEX SODIUM ER 500 MG PO TB24
1500.0000 mg | ORAL_TABLET | Freq: Every day | ORAL | Status: DC
  Administered 2021-09-26 – 2021-10-01 (×6): 1500 mg via ORAL
  Filled 2021-09-26 (×3): qty 3
  Filled 2021-09-26: qty 21
  Filled 2021-09-26 (×4): qty 3

## 2021-09-26 MED ORDER — RISPERIDONE 2 MG PO TBDP: 2.0000 mg | ORAL_TABLET | Freq: Three times a day (TID) | ORAL | Status: DC | PRN

## 2021-09-26 MED ORDER — ZIPRASIDONE MESYLATE 20 MG IM SOLR: 20.0000 mg | Freq: Four times a day (QID) | INTRAMUSCULAR | Status: DC | PRN

## 2021-09-26 MED ORDER — TRAZODONE HCL 100 MG PO TABS
100.0000 mg | ORAL_TABLET | Freq: Every day | ORAL | Status: DC
  Administered 2021-09-26 – 2021-10-01 (×6): 100 mg via ORAL
  Filled 2021-09-26: qty 7
  Filled 2021-09-26 (×9): qty 1

## 2021-09-26 MED ORDER — NICOTINE 21 MG/24HR TD PT24
21.0000 mg | MEDICATED_PATCH | Freq: Every day | TRANSDERMAL | Status: DC
Start: 2021-09-26 — End: 2021-09-26
  Filled 2021-09-26 (×2): qty 1

## 2021-09-26 MED ORDER — LORAZEPAM 1 MG PO TABS: 1.0000 mg | ORAL_TABLET | Freq: Four times a day (QID) | ORAL | Status: DC | PRN

## 2021-09-26 MED ORDER — MAGNESIUM HYDROXIDE 400 MG/5ML PO SUSP: 30.0000 mL | Freq: Every day | ORAL | Status: DC | PRN

## 2021-09-26 MED ORDER — TRAZODONE HCL 50 MG PO TABS
50.0000 mg | ORAL_TABLET | Freq: Every evening | ORAL | Status: DC | PRN
Start: 2021-09-26 — End: 2021-10-02
  Administered 2021-09-26: 50 mg via ORAL
  Filled 2021-09-26: qty 1

## 2021-09-26 MED ORDER — ACETAMINOPHEN 325 MG PO TABS: 650.0000 mg | ORAL_TABLET | Freq: Four times a day (QID) | ORAL | Status: DC | PRN

## 2021-09-26 MED ORDER — BENZTROPINE MESYLATE 0.5 MG PO TABS
0.5000 mg | ORAL_TABLET | Freq: Two times a day (BID) | ORAL | Status: DC
  Administered 2021-09-26 – 2021-10-02 (×13): 0.5 mg via ORAL
  Filled 2021-09-26 (×4): qty 1
  Filled 2021-09-26: qty 14
  Filled 2021-09-26 (×8): qty 1
  Filled 2021-09-26: qty 14
  Filled 2021-09-26 (×4): qty 1

## 2021-09-26 MED ORDER — NICOTINE POLACRILEX 2 MG MT GUM
2.0000 mg | CHEWING_GUM | OROMUCOSAL | Status: DC
  Administered 2021-09-26 – 2021-10-02 (×22): 2 mg via ORAL
  Filled 2021-09-26 (×42): qty 1

## 2021-09-26 NOTE — ED Notes (Signed)
Safe Transport contacted for transfer to [facility] 

## 2021-09-26 NOTE — [facility] Group Notes (Signed)
[facility] Group Notes:  (Nursing/MHT/Case Management/Adjunct)  Date:  09/26/2021  Time:  0930  Type of Therapy:   Goals group  Participation Level:  Active  Participation Quality:  Appropriate, Inattentive, Redirectable, Sharing, and Supportive  Affect:  Anxious and Excited  Cognitive:  Alert, Disorganized, and Lacking  Insight:  Lacking  Engagement in Group:  Developing/Improving, Limited, and Supportive  Modes of Intervention:  Discussion, Education, Exploration, Problem-solving, Rapport Building, Socialization, and Support  Summary of Progress/Problems: Pt attended goal group and was pleasant throughout. Pt left and returned approximately 8 times during group for different reasons. Pt was interactive and supportive with peers, he did not stay long enough to share his goal.   Karren Burly 09/26/2021, 11:38 AM

## 2021-09-26 NOTE — Group Note (Signed)
Recreation Therapy Group Note   Group Topic:Stress Management  Group Date: 09/26/2021 Start Time: 0930 End Time: 1000 Facilitators: Caroll Rancher, LRT,CTRS Location: [address] Dayroom   Goal Area(s) Addresses:  Patient will review and complete packet supporting identification of stressors and and techniques to combat compounding stress.      Clinical Observations/Individualized Feedback: LRT provided pt a workbook reviewing stress management concepts and offering an opportunity to create a plan to address stressors post d/c. Pt given the option to complete the packet in the dayroom with peers or at their own pace in their room.    Plan: Continue to engage patient in RT group sessions 2-3x/week.   Caroll Rancher, LRT,CTRS 09/26/2021 1:40 PM

## 2021-09-26 NOTE — Plan of Care (Signed)
Patient ate dinner and fell asleep right after admission. Currently in bed and safety precautions maintained.

## 2021-09-26 NOTE — BHH Counselor (Signed)
Adult Comprehensive Assessment  Patient ID: Jay Woods, male   DOB: Jul 16, 2000, 21 y.o.   MRN: [id number]  Information Source: Information source: Patient  Current Stressors:  Patient states their primary concerns and needs for treatment are:: "Depression, anxiety, and mood swings" Patient states their goals for this hospitilization and ongoing recovery are:: "To be a better man" Educational / Learning stressors: Pt reports having an 11th grade education Employment / Job issues: Pt reports being unemployed Family Relationships: Pt reports no stressors Surveyor, quantity / Lack of resources (include bankruptcy): Pt reports his mother helps him financially Housing / Lack of housing: Pt reports living with his mother and 2 sisters Physical health (include injuries & life threatening diseases): Pt reports no stressors Social relationships: Pt reports no stressors Substance abuse: Pt reports using Marijuana daily Bereavement / Loss: Pt reports no stressors  Living/Environment/Situation:  Living Arrangements: Parent, Other relatives Living conditions (as described by patient or guardian): House/[place] Who else lives in the home?: Mother and 2 sisters How long has patient lived in current situation?: "On and off my entire life" What is atmosphere in current home: Comfortable, Supportive  Family History:  Marital status: Long term relationship Long term relationship, how long?: "I am not sure" What types of issues is patient dealing with in the relationship?: "Miscommunication" Are you sexually active?: Yes What is your sexual orientation?: Heterosexual Has your sexual activity been affected by drugs, alcohol, medication, or emotional stress?: No Does patient have children?: No  Childhood History:  By whom was/is the patient raised?: Both parents Description of patient's relationship with caregiver when they were a child: "It was a good relationship but they were strict" Patient's  description of current relationship with people who raised him/her: "They care about me but I am closer to my mother than my father" How were you disciplined when you got in trouble as a child/adolescent?: Spankings Does patient have siblings?: Yes Number of Siblings: 5 Description of patient's current relationship with siblings: "I get along great with my 2 sister on my mother's side but I dont get to see my 2 brothers and 1 sister on my father's side because they live further away" Did patient suffer any verbal/emotional/physical/sexual abuse as a child?: No Did patient suffer from severe childhood neglect?: No Has patient ever been sexually abused/assaulted/raped as an adolescent or adult?: No Was the patient ever a victim of a crime or a disaster?: No Witnessed domestic violence?: No Has patient been affected by domestic violence as an adult?: No  Education:  Highest grade of school patient has completed: 11th grade Currently a student?: No Learning disability?: No  Employment/Work Situation:   Employment Situation: Unemployed Patient's Job has Been Impacted by Current Illness: No What is the Longest Time Patient has Held a Job?: "On and off for 7 years" 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?: No  Financial Resources:   Surveyor, quantity resources: Support from parents / caregiver Does patient have a Lawyer or guardian?: No  Alcohol/Substance Abuse:   What has been your use of drugs/alcohol within the last 12 months?: Pt reports using Marijuana daily and states that he previously used Cocaine and Methamphetamines several months ago If attempted suicide, did drugs/alcohol play a role in this?: No Alcohol/Substance Abuse Treatment Hx: Past Tx, Inpatient If yes, describe treatment: Pt reports a previous admission to [facility] Has alcohol/substance abuse ever caused legal problems?: Yes (Pt reports having a court  date for a speeding ticket in August 2023)  Social Support System:   Patient's Community Support System: Good Describe Community Support System: Hospital, God, Mother, Sister, and Friends Type of faith/religion: Ephriam Knuckles How does patient's faith help to cope with current illness?: Prayer  Leisure/Recreation:   Do You Have Hobbies?: Yes Leisure and Hobbies: Company secretary, Soccer, Music, and Sports  Strengths/Needs:   What is the patient's perception of their strengths?: "Faith and Hope" Patient states they can use these personal strengths during their treatment to contribute to their recovery: "Motivation, to be stronger, and healtier" Patient states these barriers may affect/interfere with their treatment: None Patient states these barriers may affect their return to the community: None Other important information patient would like considered in planning for their treatment: None  Discharge Plan:   Currently receiving community mental health services: No Patient states concerns and preferences for aftercare planning are: Pt is interested in therapy and medication management Patient states they will know when they are safe and ready for discharge when: Pt did not specify Does patient have access to transportation?: Yes (Mother) Does patient have financial barriers related to discharge medications?: Yes Patient description of barriers related to discharge medications: Limited Income and no medical insurance Will patient be returning to same living situation after discharge?: Yes  Summary/Recommendations:   Summary and Recommendations (to be completed by the evaluator): [name] is a 21 year old, male, who was admitted to the hosptial due to delusions, worsening depression, anxiety, and substance use.  The Pt reports experiencing depressive sympotms since age 39 and states that he has been using substance since high school.  The Pt reports living with his mother and 2  sisters.  He states that he is close with both of his parents but is close with his mother.  He states that he has a good relationship with his 5 siblings but only gets to see the two that he lives with due to his father living out of town.  He reports no childhood trauma or abuse.  The Pt reports having a girlfriend and states that they have difficulty with communication but is unable to share how long he has been in a relationship with his individual.  He states that he has an 11th grade education due to being expelled and is currently unemployed.  The Pt reports that his mother helps him financially.  He reports using Marijuana daily and states that he previously used Cocaine and Methamphetamines during high school.  He denies any other substance use at this time and reports that his last inpatient admission was to [facility].  He is unable to share the dates for this previous admission.  While in the hospital the Pt can benefit from crisis stabilization, medication evaluation, group therapy, psycho-education, case management, and discharge planning.  Upon discharge the Pt would like to return home with his mother.  It is recommended that the Pt follow-up with a local outpatient provider for therapy and medication management services.  The Pt will also be given referrals and information for substance use treatment.  [name]. 09/26/2021

## 2021-09-26 NOTE — Progress Notes (Signed)
Pt was accepted to [facility] 09/25/21; Bed Assignment 405-1   Pt meets inpatient criteria per Carlyn Reichert, MD  Attending Physician will be Dr. Phineas Inches  Report can be called to: -Adult unit: [id number]  RN notified: Hansel Starling, RN  Kelton Pillar, LCSWA 09/26/2021 @ 12:05 AM

## 2021-09-26 NOTE — Group Note (Signed)
LCSW Group Therapy Note   Group Date: 09/26/2021 Start Time: 1300 End Time: 1400  Date/Time:  Type of Therapy and Topic:  Group Therapy:  triggers  Participation Level:  Did not attend   Description of Group:   Recognizing Triggers: Patients defined triggers and discussed the importance of recognizing their personal warning signs. Patients identified their own triggers and how they tend to cope with stressful situations. Patients discussed areas such as people, places, things, and thoughts that rigger certain emotions for them. CSW provided support to patients and discussed safety planning for when these triggers occur. Group participants had opportunities to share openly with the group and participate in a group discussion while providing support and feedback to their peers.  Therapeutic Goals: Patient will identify triggers that are contributing to a problem in their life Patient will identify unwanted behaviors and feelings associated with a trigger.  Patient will share with other group members strategies to confront and avoid triggers so that they may be able to react appropriately to triggers in daily life.    Summary of Patient Progress:   Did not attend    Therapeutic Modalities:   Cognitive Behavioral Therapy Solution Focused Therapy Motivational Interviewing Family Systems Approach  [name], [place] 09/26/2021  1:59 PM

## 2021-09-26 NOTE — Progress Notes (Signed)
   09/26/21 1000  Psychosocial Assessment  Patient Complaints Anxiety  Eye Contact Fair  Facial Expression Anxious  Affect Preoccupied;Silly  Speech Rapid  Interaction Childlike  Motor Activity Fidgety;Restless  Appearance/Hygiene Unremarkable  Behavior Characteristics Cooperative;Appropriate to situation  Mood Labile;Silly;Preoccupied  Thought Process  Coherency Disorganized  Content Preoccupation  Delusions None reported or observed  Perception UTA (None reported.)  Hallucination None reported or observed  Judgment Poor  Confusion None  Danger to Self  Current suicidal ideation? Denies (Denies.)  Agreement Not to Harm Self Yes  Description of Agreement verbal  Danger to Others  Danger to Others None reported or observed

## 2021-09-26 NOTE — BH IP Treatment Plan (Signed)
Interdisciplinary Treatment and Diagnostic Plan Update  09/26/2021 Time of Session: 9:55am Jay Woods MRN: [id number]  Principal Diagnosis: Polysubstance abuse ([place])  Secondary Diagnoses: Principal Problem:   Polysubstance abuse ([place])   Current Medications:  Current Facility-Administered Medications  Medication Dose Route Frequency Provider Last Rate Last Admin   acetaminophen (TYLENOL) tablet 650 mg  650 mg Oral Q6H PRN Evette Georges, NP       alum & mag hydroxide-simeth (MAALOX/MYLANTA) 200-200-20 MG/5ML suspension 30 mL  30 mL Oral Q4H PRN Evette Georges, NP       magnesium hydroxide (MILK OF MAGNESIA) suspension 30 mL  30 mL Oral Daily PRN Evette Georges, NP       PTA Medications: No medications prior to admission.    Patient Stressors:    Patient Strengths:    Treatment Modalities: Medication Management, Group therapy, Case management,  1 to 1 session with clinician, Psychoeducation, Recreational therapy.   Physician Treatment Plan for Primary Diagnosis: Polysubstance abuse ([place]) Long Term Goal(s):     Short Term Goals:    Medication Management: Evaluate patient's response, side effects, and tolerance of medication regimen.  Therapeutic Interventions: 1 to 1 sessions, Unit Group sessions and Medication administration.  Evaluation of Outcomes: Not Met  Physician Treatment Plan for Secondary Diagnosis: Principal Problem:   Polysubstance abuse ([place])  Long Term Goal(s):     Short Term Goals:       Medication Management: Evaluate patient's response, side effects, and tolerance of medication regimen.  Therapeutic Interventions: 1 to 1 sessions, Unit Group sessions and Medication administration.  Evaluation of Outcomes: Not Met   RN Treatment Plan for Primary Diagnosis: Polysubstance abuse ([place]) Long Term Goal(s): Knowledge of disease and therapeutic regimen to maintain health will improve  Short Term Goals: Ability to remain free from injury will  improve, Ability to verbalize frustration and anger appropriately will improve, Ability to demonstrate self-control, Ability to participate in decision making will improve, Ability to verbalize feelings will improve, Ability to disclose and discuss suicidal ideas, Ability to identify and develop effective coping behaviors will improve, and Compliance with prescribed medications will improve  Medication Management: RN will administer medications as ordered by provider, will assess and evaluate patient's response and provide education to patient for prescribed medication. RN will report any adverse and/or side effects to prescribing provider.  Therapeutic Interventions: 1 on 1 counseling sessions, Psychoeducation, Medication administration, Evaluate responses to treatment, Monitor vital signs and CBGs as ordered, Perform/monitor CIWA, COWS, AIMS and Fall Risk screenings as ordered, Perform wound care treatments as ordered.  Evaluation of Outcomes: Not Met   LCSW Treatment Plan for Primary Diagnosis: Polysubstance abuse ([place]) Long Term Goal(s): Safe transition to appropriate next level of care at discharge, Engage patient in therapeutic group addressing interpersonal concerns.  Short Term Goals: Engage patient in aftercare planning with referrals and resources, Increase social support, Increase ability to appropriately verbalize feelings, Increase emotional regulation, Facilitate acceptance of mental health diagnosis and concerns, Facilitate patient progression through stages of change regarding substance use diagnoses and concerns, Identify triggers associated with mental health/substance abuse issues, and Increase skills for wellness and recovery  Therapeutic Interventions: Assess for all discharge needs, 1 to 1 time with Social worker, Explore available resources and support systems, Assess for adequacy in community support network, Educate family and significant other(s) on suicide prevention,  Complete Psychosocial Assessment, Interpersonal group therapy.  Evaluation of Outcomes: Not Met   Progress in Treatment: Attending groups: Yes. Participating in groups: Yes. Taking medication  as prescribed: Yes. Toleration medication: Yes. Family/Significant other contact made: No, will contact:  CSW will assess and identify supports  Patient understands diagnosis: No. Discussing patient identified problems/goals with staff: Yes. Medical problems stabilized or resolved: Yes. Denies suicidal/homicidal ideation: Yes. Issues/concerns per patient self-inventory: No.   New problem(s) identified: No, Describe:  none reported  New Short Term/Long Term Goal(s):   medication stabilization, elimination of SI thoughts, development of comprehensive mental wellness plan.    Patient Goals:  Pt states, "I want my mood to not be all over the place"  Discharge Plan or Barriers: Patient recently admitted. CSW will continue to follow and assess for appropriate referrals and possible discharge planning.    Reason for Continuation of Hospitalization: Anxiety Delusions  Depression Hallucinations Medication stabilization  Estimated Length of Stay: 3-5 days  Last 3 [place] Suicide Severity Risk Score: [place] Admission (Current) from 09/26/2021 in [facility] Most recent reading at 09/26/2021  1:31 AM ED from 09/24/2021 in [facility] Most recent reading at 09/25/2021 12:19 AM Admission (Discharged) from OP Visit from 09/24/2021 in [facility] [facility] Most recent reading at 09/24/2021  4:12 PM  C-SSRS RISK CATEGORY No Risk No Risk No Risk       Last PHQ 2/9 Scores:     No data to display          Scribe for Treatment Team: Zachery Conch, LCSW 09/26/2021 1:44 PM

## 2021-09-26 NOTE — [facility] Suicide Risk Assessment (Signed)
Suicide Risk Assessment  Admission Assessment    [facility] Admission Suicide Risk Assessment   Nursing information obtained from:  Patient Demographic factors:  Male, Unemployed, Low socioeconomic status Current Mental Status:  NA Loss Factors:  Financial problems / change in socioeconomic status Historical Factors:  NA Risk Reduction Factors:  Living with another person, especially a relative  Total Time spent with patient: 45 minutes Principal Problem: Schizoaffective disorder, bipolar type (HCC) Diagnosis:  Principal Problem:   Schizoaffective disorder, bipolar type (HCC) Active Problems:   Marijuana abuse   Moderate benzodiazepine use disorder (HCC)   Methamphetamine use disorder, mild (HCC)   Nicotine use disorder  Subjective Data: Jay Woods is a 21 year old male with a past psychiatric history of schizophrenia spectrum disorder, unspecified type, substance-induced mood disorder, multiple substance use disorders, and psychotic disorder who presented voluntarily from [facility] for paranoia, mania, and disorganized thoughts.  Continued Clinical Symptoms:  Alcohol Use Disorder Identification Test Final Score (AUDIT): 7 The "Alcohol Use Disorders Identification Test", Guidelines for Use in Primary Care, Second Edition.  World Science writer [facility]). Score between 0-7:  no or low risk or alcohol related problems. Score between 8-15:  moderate risk of alcohol related problems. Score between 16-19:  high risk of alcohol related problems. Score 20 or above:  warrants further diagnostic evaluation for alcohol dependence and treatment.   CLINICAL FACTORS:   Bipolar Disorder:   Mixed State Alcohol/Substance Abuse/Dependencies Schizophrenia:   Depressive state Less than 59 years old Paranoid or undifferentiated type More than one psychiatric diagnosis Currently Psychotic Unstable or Poor Therapeutic Relationship Previous Psychiatric Diagnoses and  Treatments   Musculoskeletal: Strength & Muscle Tone: within normal limits Gait & Station: normal Patient leans: N/A  Psychiatric Specialty Exam:  Presentation  General Appearance: Appropriate for Environment; Casual  Eye Contact:Good  Speech:Pressured  Speech Volume:Normal  Handedness:Ambidextrous   Mood and Affect  Mood:Labile; Depressed  Affect:Labile   Thought Process  Thought Processes:Disorganized  Descriptions of Associations:Tangential  Orientation:Full (Time, Place and Person)  Thought Content:Tangential; Scattered  History of Schizophrenia/Schizoaffective disorder:Yes  Duration of Psychotic Symptoms:Greater than six months  Hallucinations:Hallucinations: None  Ideas of Reference:None  Suicidal Thoughts:Suicidal Thoughts: No  Homicidal Thoughts:Homicidal Thoughts: No   Sensorium  Memory:Immediate Fair; Recent Fair  Judgment:Impaired  Insight:Poor   Executive Functions  Concentration:Fair  Attention Span:Fair  Recall:Fair  Fund of Knowledge:Fair  Language:Fair   Psychomotor Activity  Psychomotor Activity:Psychomotor Activity: Normal   Assets  Assets:Desire for Improvement; Social Support   Sleep  Sleep:Sleep: Poor    Physical Exam: Vitals reviewed.  Constitutional:      General: He is not in acute distress.    Appearance: Normal appearance. He is not toxic-appearing or diaphoretic.  HENT:     Head: Normocephalic and atraumatic.  Pulmonary:     Effort: Pulmonary effort is normal.  Skin:    General: Skin is warm and dry.  Neurological:     General: No focal deficit present.     Mental Status: He is alert and oriented to person, place, and time.     Motor: No weakness.     Gait: Gait normal.      Review of Systems  Constitutional:  Negative for malaise/fatigue.  Eyes:  Positive for blurred vision.       Not wearing glasses  Gastrointestinal: Negative.   Genitourinary: Negative.   Musculoskeletal: Negative.    Neurological:  Negative for dizziness, tremors, seizures, loss of consciousness and headaches.  Blood pressure (!) 150/91, pulse Marland Kitchen)  53, temperature 98.1 F (36.7 C), resp. rate 18, height 5\' 9"  (1.753 m), weight 61.7 kg, SpO2 98 %. Body mass index is 20.08 kg/m.   COGNITIVE FEATURES THAT CONTRIBUTE TO RISK:  None    SUICIDE RISK:   Mild:  Suicidal ideation of limited frequency, intensity, duration, and specificity.  There are no identifiable plans, no associated intent, mild dysphoria and related symptoms, good self-control (both objective and subjective assessment), few other risk factors, and identifiable protective factors, including available and accessible social support.  PLAN OF CARE:  Treatment Plan Summary: Daily contact with patient to assess and evaluate symptoms and progress in treatment   Physician Treatment Plan for Primary Diagnosis: Principal Problem:   Schizoaffective disorder, bipolar type (HCC) Long Term Goal(s): Improvement in symptoms so as ready for discharge   Short Term Goals: Ability to identify changes in lifestyle to reduce recurrence of condition will improve, Ability to verbalize feelings will improve, Ability to demonstrate self-control will improve, Ability to identify and develop effective coping behaviors will improve, and Compliance with prescribed medications will improve   Physician Treatment Plan for Secondary Diagnosis: Principal Problem:   Schizoaffective disorder, bipolar type (HCC) Active Problems:   Marijuana abuse   Moderate benzodiazepine use disorder (HCC)   Methamphetamine use disorder, mild (HCC)   Nicotine use disorder     Long Term Goal(s): Improvement in symptoms so as ready for discharge   Short Term Goals: Ability to identify changes in lifestyle to reduce recurrence of condition will improve, Ability to verbalize feelings will improve, Ability to disclose and discuss suicidal ideas, Ability to demonstrate self-control will  improve, Ability to identify and develop effective coping behaviors will improve, Ability to maintain clinical measurements within normal limits will improve, Compliance with prescribed medications will improve, and Ability to identify triggers associated with substance abuse/mental health issues will improve   I certify that inpatient services furnished can reasonably be expected to improve the patient's condition.     Assessment:   Diagnoses / Active Problems:   Safety and Monitoring: voluntarily admission to inpatient psychiatric unit for safety, stabilization and treatment Daily contact with patient to assess and evaluate symptoms and progress in treatment Patient's case to be discussed in multi-disciplinary team meeting Observation Level : q15 minute checks Vital signs: q12 hours Precautions: suicide, elopement, and assault   2. Psychiatric Diagnoses and Treatment # Schizoaffective disorder, bipolar type, current episode mixed - Start Risperdal 1 mg twice daily - Start Cogentin 0.5 mg twice daily - Start Depakote 1500 mg nightly (Depakote 750 mg initiated at [facility])             -LFTs WNL, platelets normal             -We will obtain VPA level 8/6 AM   #Nicotine use disorder #Cannabis use disorder #Benzodiazepine use disorder # Methamphetamine use disorder #Alcohol use disorder -Counseled on complete cessation of all substances -Ordered nicotine gum -CIWA protocol for monitoring of withdrawal with po thiamine and MVI replacement and Ativan 1mg  for scores >10   PRN: Trazodone 50 mg nightly as needed sleep; if 150 mg trazodone insufficient, consider adding benzodiazepine for sleep.  Patient with history of taking Restoril.   -- The risks/benefits/side-effects/alternatives to this medication were discussed in detail with the patient and time was given for questions. The patient consents to medication trial.              -- Metabolic profile and EKG monitoring obtained while on an  atypical antipsychotic  BMI: 20.08 Lipid Panel: WNL HbgA1c: 5.4% QTc: Pending             -- Encouraged patient to participate in unit milieu and in scheduled group therapies   Lamar Sprinkles, MD 09/26/2021, 3:47 PM

## 2021-09-26 NOTE — [facility] Group Notes (Signed)
Pt attended AA meeting this evening (30 min approx.). Pt was unable to focus during the meeting and was in and out of the room 3+ times during group. Pt was asked to relax in their room for the duration of the meeting.
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
## 2021-09-26 NOTE — Progress Notes (Signed)
Patient ID: Jay Woods, male   DOB: 20-Aug-2000, 21 y.o.   MRN: [id number] Patient presents with chief complaint of substance abuse, reporting that he is tired of using drugs. Seeking treatment to help with his addiction. Patient reports frequent use of multiple substances including THC, CBD, Meth, e-pills, ETOH, Xanax, Cocaine, etc. Last use was 5 days ago (used [facility]). Reports that his mother is supportive and asked him to get treatment "or get out of the house". Patient reports that he may not be able to return home upon discharge. Seeking a long term treatment program to address his addiction. Reports that his father is not supportive "he calls me a loser". Patient is currently unemployed and can not afford to live independently. Reports hx of mood disorders and has not been taking medications "I used to take Trazodone and another medication for my mood...". Was restless during this assessment. Having difficulty to sit still. Denied SI/HI/AVH but admits that he sometimes has hallucinations. Skin assessment completed by Clinical research associate, assisted by Dedra Skeens, MHT. Patient was oriented to the unit and safety precautions initiated.

## 2021-09-27 LAB — URINALYSIS, ROUTINE W REFLEX MICROSCOPIC
Bilirubin Urine: NEGATIVE
Glucose, UA: NEGATIVE mg/dL
Hgb urine dipstick: NEGATIVE
Ketones, ur: NEGATIVE mg/dL
Leukocytes,Ua: NEGATIVE
Nitrite: NEGATIVE
Protein, ur: NEGATIVE mg/dL
Specific Gravity, Urine: 1.004 — ABNORMAL LOW (ref 1.005–1.030)
pH: 8 (ref 5.0–8.0)

## 2021-09-27 LAB — GLUCOSE, CAPILLARY: Glucose-Capillary: 107 mg/dL — ABNORMAL HIGH (ref 70–99)

## 2021-09-27 LAB — T3, FREE: T3, Free: 3.7 pg/mL (ref 2.0–4.4)

## 2021-09-27 MED ORDER — ONDANSETRON 4 MG PO TBDP: 4.0000 mg | ORAL_TABLET | Freq: Four times a day (QID) | ORAL | Status: AC | PRN

## 2021-09-27 MED ORDER — LORAZEPAM 1 MG PO TABS: 1.0000 mg | ORAL_TABLET | Freq: Four times a day (QID) | ORAL | Status: AC | PRN

## 2021-09-27 MED ORDER — ADULT MULTIVITAMIN W/MINERALS CH
1.0000 | ORAL_TABLET | Freq: Every day | ORAL | Status: DC
  Administered 2021-09-27 – 2021-10-02 (×6): 1 via ORAL
  Filled 2021-09-27 (×11): qty 1

## 2021-09-27 MED ORDER — LOPERAMIDE HCL 2 MG PO CAPS: 2.0000 mg | ORAL_CAPSULE | ORAL | Status: AC | PRN

## 2021-09-27 MED ORDER — THIAMINE HCL 100 MG PO TABS
100.0000 mg | ORAL_TABLET | Freq: Every day | ORAL | Status: DC
Start: 2021-09-28 — End: 2021-10-02
  Administered 2021-09-28 – 2021-10-02 (×5): 100 mg via ORAL
  Filled 2021-09-27 (×9): qty 1

## 2021-09-27 MED ORDER — AMLODIPINE BESYLATE 5 MG PO TABS
5.0000 mg | ORAL_TABLET | Freq: Every day | ORAL | Status: DC
  Administered 2021-09-27 – 2021-10-02 (×6): 5 mg via ORAL
  Filled 2021-09-27 (×7): qty 1
  Filled 2021-09-27: qty 7
  Filled 2021-09-27 (×3): qty 1

## 2021-09-27 MED ORDER — RISPERIDONE 1 MG PO TABS
1.0000 mg | ORAL_TABLET | Freq: Every day | ORAL | Status: DC
Start: 2021-09-28 — End: 2021-10-02
  Administered 2021-09-28 – 2021-10-02 (×5): 1 mg via ORAL
  Filled 2021-09-27: qty 1
  Filled 2021-09-27: qty 21
  Filled 2021-09-27 (×7): qty 1

## 2021-09-27 MED ORDER — LORAZEPAM 1 MG PO TABS
1.0000 mg | ORAL_TABLET | Freq: Two times a day (BID) | ORAL | Status: DC
Start: 2021-09-27 — End: 2021-09-30
  Administered 2021-09-27 – 2021-09-29 (×5): 1 mg via ORAL
  Filled 2021-09-27 (×6): qty 1

## 2021-09-27 MED ORDER — RISPERIDONE 2 MG PO TABS
2.0000 mg | ORAL_TABLET | Freq: Every day | ORAL | Status: DC
  Administered 2021-09-27 – 2021-10-01 (×5): 2 mg via ORAL
  Filled 2021-09-27 (×7): qty 1

## 2021-09-27 NOTE — Progress Notes (Signed)
Pt has been repeatedly going to the bathroom (3 times in the past hour) and asking for water. Pt also appears to have a problem starting urination. When he went to the bathroom he counted to 10 and was heard saying "please, oh god please" then began to urinate. Pt then said "thank you lord, thank you lord, thank you lord". Blood sugar was taken and it was 107 at 1017. Pt is on lockout orders due to repeatedly getting in the shower and clogging the toilet. Pt remains safe on Q15 min checks and contracts for safety.

## 2021-09-27 NOTE — [facility] Group Notes (Signed)
Adult Psychoeducational Group Note  Date:  09/27/2021 Time:  9:08 PM  Group Topic/Focus:  Wrap-Up Group:   The focus of this group is to help patients review their daily goal of treatment and discuss progress on daily workbooks.  Participation Level:  Active  Participation Quality:  Appropriate  Affect:  Excited  Cognitive:  Alert  Insight: Lacking  Engagement in Group:  Distracting and Off Topic  Modes of Intervention:  Discussion and Education  Additional Comments:  Pt attended and participated in wrap up group this evening and rated their day a 10/10, due to them going to groups. Pt goal was to communicate with peers and to have a healthy weight and "not lose". Pt could not explain further what their goal means.   Chrisandra Netters 09/27/2021, 9:08 PM

## 2021-09-27 NOTE — Progress Notes (Signed)
Pt has used the bathroom 9 times between 7a-7p. He was on room lockout and repeatedly asked to use the bathroom. Pt remains safe on Q15 min checks and contracts for safety.

## 2021-09-27 NOTE — Group Note (Signed)
  [facility] LCSW Group Therapy Note  Date/Time:  09/27/2021 10:00am-11:00am  Type of Therapy and Topic:  Group Therapy:  Stability after Discharge  Participation Level:  Active   Description of Group This process group involved patients discussing what their overall goal is at this time in their life and how they plan to work toward that goal when they get home from the hospital.  The group started with patients sharing their goal and plans, then proceeded with group members identifying with each other.  A discussion ensued about the differences in healthy and unhealthy coping skills and a variety of specific coping skills that might be helpful in specific instances were shared.  Group members shared ideas about making changes when they return home so that they can stay well and in recovery.  These included boundaries, putting oneself first, staying on medications, talking to a therapist, and more  Therapeutic Goals Patient will identify one overall goal Patient will list current ideas for how to go about achieving their goal Patient will participate in generating ideas about healthy self-care options when they return to the community Patient will be supportive of one another and receive support from others Patient will receive affirmations and hope  Summary of Patient Progress:  The patient expressed his goal is "to get married and be a 'better man' and to be healthier."  The plan currently on how to achieve that goal is to have faith, hope, prayer, and stay on medication.   The patient also was in and out of the room frequently, would inappropriately announce first that he needed to go "number one" or "I've got to go pee."  He had side conversations and had to be redirected numerous times.   Therapeutic Modalities Brief Solution-Focused Therapy Psychoeducation   Ambrose Mantle, LCSW 09/27/2021, 12:00pm

## 2021-09-27 NOTE — Plan of Care (Signed)
  Problem: Education: Goal: Knowledge of [facility] Education information/materials will improve Outcome: Progressing Goal: Emotional status will improve Outcome: Progressing Goal: Mental status will improve Outcome: Progressing Goal: Verbalization of understanding the information provided will improve Outcome: Progressing   Problem: Education: Goal: Knowledge of disease or condition will improve Outcome: Progressing   Problem: Health Behavior/Discharge Planning: Goal: Identification of resources available to assist in meeting health care needs will improve Outcome: Progressing

## 2021-09-27 NOTE — Progress Notes (Signed)
Patient appears elated. Patient denies SI/HI/AVH. Pt is hyper-verbal and has pressured speech. Pt was found taking a shower in the dark around 0745. Pt has drank 3 cups of water by 9244. Pt thoughts are disorganized. Pt stated "I am worried about my girl and I hope she is ok, she found me cheating with an Asian and a [place] girl". Pt is very childlike. Patient complied with morning medication with no reported side effects. Patient remains safe on Q37min checks and contracts for safety.      09/27/21 0801  Psych Admission Type (Psych Patients Only)  Admission Status Voluntary  Psychosocial Assessment  Patient Complaints Restlessness;Hyperactivity;Anxiety  Eye Contact Brief  Facial Expression Animated;Anxious  Affect Preoccupied;Labile;Silly  Speech Pressured;Rapid;Loud  Interaction Childlike  Motor Activity Restless  Appearance/Hygiene Unremarkable  Behavior Characteristics Appropriate to situation;Cooperative;Hyperactive  Mood Preoccupied;Pleasant;Elated  Thought Process  Coherency Disorganized;Flight of ideas  Content Preoccupation  Delusions None reported or observed  Perception WDL  Hallucination None reported or observed  Judgment Poor  Confusion None  Danger to Self  Current suicidal ideation? Denies  Agreement Not to Harm Self Yes  Description of Agreement verbal  Danger to Others  Danger to Others None reported or observed

## 2021-09-27 NOTE — Progress Notes (Signed)
   09/26/21 2110  Psychosocial Assessment  Patient Complaints Restlessness;Hyperactivity;Anxiety  Eye Contact Brief  Facial Expression Animated;Anxious  Affect Preoccupied;Labile  Speech Pressured;Rapid  Interaction Childlike  Motor Activity Restless  Appearance/Hygiene Unremarkable  Behavior Characteristics Appropriate to situation;Cooperative  Mood Pleasant;Preoccupied  Thought Process  Coherency Disorganized;Flight of ideas  Content Preoccupation  Delusions WDL  Perception WDL  Hallucination None reported or observed  Judgment Poor  Confusion Mild  Danger to Self  Current suicidal ideation? Denies

## 2021-09-27 NOTE — Progress Notes (Signed)
   09/27/21 2025  Psych Admission Type (Psych Patients Only)  Admission Status Voluntary  Psychosocial Assessment  Patient Complaints Anxiety;Restlessness  Eye Contact Brief  Facial Expression Animated;Anxious  Affect Anxious  Speech Pressured;Rapid  Interaction Childlike  Motor Activity Restless  Appearance/Hygiene Unremarkable  Behavior Characteristics Cooperative;Appropriate to situation  Mood Pleasant;Preoccupied  Thought Process  Coherency Disorganized;Flight of ideas  Content Preoccupation  Delusions WDL  Perception WDL  Hallucination None reported or observed  Judgment Poor  Confusion Mild  Danger to Self  Current suicidal ideation? Denies

## 2021-09-27 NOTE — Progress Notes (Addendum)
[facility] MD Progress Note  09/27/2021 2:33 PM Jay Woods  MRN:  [id number]  Subjective:  Jay Woods states, " I feel good like a superhero called Bentine."  Brief History: Jay Woods is a 21 year old male with a past psychiatric history of schizophrenia spectrum disorder, unspecified type, substance-induced mood disorder, multiple substance use disorders, and psychotic disorder who presented voluntarily from [facility] for paranoia, mania, and disorganized thoughts.   Yesterday's psychiatric team recommendation: Schizoaffective disorder, bipolar type, current episode mixed - Start Risperdal 1 mg twice daily - Start Cogentin 0.5 mg twice daily - Start Depakote 1500 mg nightly (Depakote 750 mg initiated at [facility])             -LFTs WNL, platelets normal             -We will obtain VPA level 8/6 AM   Assessment: On assessment today, patient was seen face-to-face and examined on the unit.  Appeared very grandiose, restless and pleasant.  Chart reviewed and findings shared with the treatment team and consult with Dr. Nelda Marseille.  Alert and oriented x 4 to person, place, time, and situation.  Maintained good eye contact with the provider during exam.  Speech clear with normal volume but pressured.  Reports that he took polysubstances including cocaine, crystal meth, ecstasy, E-pills, Molly, black magic, lean, and peck prior to coming to the hospital.  Instructions provided on cessation of polysubstance uses as they have adverse consequences on the body system and also his thinking process.  Patient responded, "yes ma'am, yes ma'am."  Presents with labile mood and affect, stating, "my mother is pretty upset with me for doing all these drugs."  Thought process disorganized, with scattered and tangential thought content.  Memory and judgment fair, with insight shallow.  Participating in therapeutic milieu and group activities in the unit.  Made aware by the nurses that the patient is clogging  the toilet with toilet tissue.This incident addressed with patient and he responded that he will be careful using the toilet.  Urine obtained for urinalysis at this time, awaiting result.  Abnormal lab results from 09/25/2021 indicates CMP: Alkaline phosphatase 36, thyroid panel: TSH 0.290, urine drug screen positive for marijuana.  Risperidone 2 mg p.o. tablets ordered at bedtime for mood and behavior, Ativan 1 mg p.o. twice daily ordered for restlessness and mania. BP 150/91- adding Norvasc 5 mg po daily for BP. Continues on Depakote 1500 mg po daily at bedtime for mood stabilization, will obtain VPA level in 09/29/21 in AM.  Patient denies suicidal ideation, homicidal ideation, paranoia, delusion, or visual/auditory hallucinations. Reports good appetite and consuming 100% of his meal.   Reports sleeping over 8 hours last night and feeling restful. When asked patient if he really slept for 8 hours last night stated, "I'm not sure."  Reports 0/10 anxiety, however, patient presents with restlessness, and 0/10 depression on a scale of 0-10, with 10 being the worst. Nursing notes indicate restlessness, hyperactivity, anxiety. Reports taking his prescribed medication as ordered without observed adverse reactions.   Principal Problem: Schizoaffective disorder, bipolar type ([place])  Diagnosis: Principal Problem:   Schizoaffective disorder, bipolar type ([place]) Active Problems:   Marijuana abuse   Moderate benzodiazepine use disorder (HCC)   Methamphetamine use disorder, mild (HCC)   Nicotine use disorder  Total Time spent with patient: 30 minutes  Past Psychiatric History: Previous Psych Diagnoses: schizophrenia spectrum disorder, unspecified type, substance-induced mood disorder, multiple substance use disorders, and psychotic disorder Prior inpatient treatment: [facility] 02/2017,  11/2018, 05/2020. Current/prior outpatient treatment: Denies Prior rehab hx: Denies Psychotherapy hx:  Denies History of suicide: Denies History of homicide: Denies Psychiatric medication history: Risperdal 2 mg every morning and 4 mg nightly, Depakote 1500 mg, Invega Sustenna 156 mg, Restoril 30 mg, Zyprexa 5 mg Psychiatric medication compliance history: Denies compliance after discharge Neuromodulation history: Denies Current Psychiatrist: Denies Current therapist: Denies   Substance Abuse Hx: Alcohol: Last drink 3 to 4 AM on 8/2 Tobacco: Uses nicotine vape Illicit drugs: Cocaine, K2, crystal meth, ecstasy Rx drug abuse: Roxicodone, Percocet, Xanax Rehab hx: Denies    Past Medical History:  Past Medical History:  Diagnosis Date   Eczema    History reviewed. No pertinent surgical history. Family History: History reviewed. No pertinent family history.  Family Psychiatric  History: None Indicated  Social History:  Social History   Substance and Sexual Activity  Alcohol Use Yes   Alcohol/week: 1.0 standard drink of alcohol   Types: 1 Cans of beer per week   Comment: twice/week     Social History   Substance and Sexual Activity  Drug Use Yes   Types: Marijuana    Social History   Socioeconomic History   Marital status: Single    Spouse name: Not on file   Number of children: Not on file   Years of education: Not on file   Highest education level: Not on file  Occupational History   Not on file  Tobacco Use   Smoking status: Former    Packs/day: 1.00    Years: 5.00    Total pack years: 5.00    Types: Cigarettes   Smokeless tobacco: Former  Scientific laboratory technician Use: Some days   Substances: CBD  Substance and Sexual Activity   Alcohol use: Yes    Alcohol/week: 1.0 standard drink of alcohol    Types: 1 Cans of beer per week    Comment: twice/week   Drug use: Yes    Types: Marijuana   Sexual activity: Not Currently  Other Topics Concern   Not on file  Social History Narrative   Not on file   Social Determinants of Health   Financial Resource Strain:  Not on file  Food Insecurity: Not on file  Transportation Needs: Not on file  Physical Activity: Not on file  Stress: Not on file  Social Connections: Not on file   Additional Social History:   Sleep: Good  Appetite:  Good  Current Medications: Current Facility-Administered Medications  Medication Dose Route Frequency Provider Last Rate Last Admin   acetaminophen (TYLENOL) tablet 650 mg  650 mg Oral Q6H PRN Evette Georges, NP       alum & mag hydroxide-simeth (MAALOX/MYLANTA) 200-200-20 MG/5ML suspension 30 mL  30 mL Oral Q4H PRN Evette Georges, NP       benztropine (COGENTIN) tablet 0.5 mg  0.5 mg Oral BID Rosezetta Schlatter, MD   0.5 mg at 09/27/21 0748   divalproex (DEPAKOTE ER) 24 hr tablet 1,500 mg  1,500 mg Oral QHS Rosezetta Schlatter, MD   1,500 mg at 09/26/21 2108   risperiDONE (RISPERDAL M-TABS) disintegrating tablet 2 mg  2 mg Oral Q8H PRN Massengill, Ovid Curd, MD       And   LORazepam (ATIVAN) tablet 1 mg  1 mg Oral Q6H PRN Massengill, Nathan, MD       And   ziprasidone (GEODON) injection 20 mg  20 mg Intramuscular Q6H PRN Janine Limbo, MD  magnesium hydroxide (MILK OF MAGNESIA) suspension 30 mL  30 mL Oral Daily PRN Sindy Guadeloupe, NP       nicotine polacrilex (NICORETTE) gum 2 mg  2 mg Oral Q4H while awake Lamar Sprinkles, MD   2 mg at 09/27/21 1113   risperiDONE (RISPERDAL) tablet 1 mg  1 mg Oral BID Lamar Sprinkles, MD   1 mg at 09/27/21 0748   traZODone (DESYREL) tablet 100 mg  100 mg Oral QHS Lamar Sprinkles, MD   100 mg at 09/26/21 2108   And   traZODone (DESYREL) tablet 50 mg  50 mg Oral QHS PRN Lamar Sprinkles, MD   50 mg at 09/26/21 2313    Lab Results:  Results for orders placed or performed during the hospital encounter of 09/26/21 (from the past 48 hour(s))  Glucose, capillary     Status: Abnormal   Collection Time: 09/27/21 10:12 AM  Result Value Ref Range   Glucose-Capillary 107 (H) 70 - 99 mg/dL    Comment: Glucose reference range applies only to  samples taken after fasting for at least 8 hours.    Blood Alcohol level:  Lab Results  Component Value Date   ETH <10 09/24/2021   ETH <10 06/09/2020    Metabolic Disorder Labs: Lab Results  Component Value Date   HGBA1C 5.4 09/24/2021   MPG 108.28 09/24/2021   MPG 114 06/09/2020   Lab Results  Component Value Date   PROLACTIN 4.4 06/09/2020   Lab Results  Component Value Date   CHOL 136 09/24/2021   TRIG 31 09/24/2021   HDL 49 09/24/2021   CHOLHDL 2.8 09/24/2021   VLDL 6 09/24/2021   LDLCALC 81 09/24/2021   LDLCALC 81 06/09/2020    Physical Findings: AIMS: Facial and Oral Movements Muscles of Facial Expression: None, normal Lips and Perioral Area: None, normal Jaw: None, normal Tongue: None, normal,Extremity Movements Upper (arms, wrists, hands, fingers): None, normal Lower (legs, knees, ankles, toes): None, normal, Trunk Movements Neck, shoulders, hips: None, normal, Overall Severity Severity of abnormal movements (highest score from questions above): None, normal Incapacitation due to abnormal movements: None, normal Patient's awareness of abnormal movements (rate only patient's report): No Awareness, Dental Status Current problems with teeth and/or dentures?: No Does patient usually wear dentures?: No  CIWA:    COWS:     Musculoskeletal: Strength & Muscle Tone: within normal limits Gait & Station: normal Patient leans: N/A  Psychiatric Specialty Exam:  Presentation  General Appearance: Appropriate for Environment; Casual; Fairly Groomed  Eye Contact:Good  Speech:Clear and Coherent; Normal Rate; Pressured  Speech Volume:Normal  Handedness:Ambidextrous  Mood and Affect  Mood:Labile  Affect:Labile  Thought Process  Thought Processes:Disorganized  Descriptions of Associations:Tangential  Orientation:Full (Time, Place and Person)  Thought Content:grandiose, scattered  History of Schizophrenia/Schizoaffective disorder:Yes  Duration of  Psychotic Symptoms:Greater than six months  Hallucinations:Hallucinations: None  Ideas of Reference:None  Suicidal Thoughts:Suicidal Thoughts: No  Homicidal Thoughts:Homicidal Thoughts: No  Sensorium  Memory:Immediate Fair; Recent Fair; Remote Fair  Judgment:Fair  Insight:Shallow  Executive Functions  Concentration:Fair  Attention Span:Fair  Recall:Fair  Fund of Knowledge:Fair  Language:Fair  Psychomotor Activity  Psychomotor Activity:Psychomotor Activity: Normal  Assets  Assets:Communication Skills; Desire for Improvement; Physical Health  Sleep  Sleep:total time unrecorded  Physical Exam Vitals and nursing note reviewed.  HENT:     Head: Normocephalic and atraumatic.     Right Ear: External ear normal.     Left Ear: External ear normal.     Nose: Nose  normal.     Mouth/Throat:     Mouth: Mucous membranes are moist.     Pharynx: Oropharynx is clear.  Eyes:     Extraocular Movements: Extraocular movements intact.     Conjunctiva/sclera: Conjunctivae normal.     Pupils: Pupils are equal, round, and reactive to light.  Cardiovascular:     Rate and Rhythm: Bradycardia present.  Pulmonary:     Effort: Pulmonary effort is normal.  Abdominal:     Palpations: Abdomen is soft.  Genitourinary:    Comments: deferred Musculoskeletal:        General: Normal range of motion.     Cervical back: Normal range of motion and neck supple.  Skin:    General: Skin is warm.  Neurological:     General: No focal deficit present.     Mental Status: He is oriented to person, place, and time.  Psychiatric:     Comments: Grandiose and manic.     Review of Systems  Constitutional: Negative.  Negative for chills and fever.  HENT:  Negative for ear pain, hearing loss and tinnitus.   Eyes: Negative.  Negative for blurred vision and double vision.  Respiratory: Negative.  Negative for cough, sputum production and wheezing.   Cardiovascular: Negative.  Negative for chest  pain and palpitations.  Gastrointestinal: Negative.  Negative for abdominal pain, constipation, diarrhea, heartburn, nausea and vomiting.  Genitourinary:  Positive for frequency. Negative for dysuria and urgency.  Musculoskeletal: Negative.  Negative for myalgias and neck pain.  Skin: Negative.  Negative for itching and rash.  Neurological: Negative.  Negative for dizziness, tingling and headaches.  Endo/Heme/Allergies: Negative.  Negative for environmental allergies and polydipsia. Does not bruise/bleed easily.  Psychiatric/Behavioral:  Positive for substance abuse.    Blood pressure (!) 150/91, pulse (!) 53, temperature 98.1 F (36.7 C), resp. rate 18, height 5\' 9"  (1.753 m), weight 61.7 kg, SpO2 98 %. Body mass index is 20.08 kg/m.   Treatment Plan Summary: Daily contact with patient to assess and evaluate symptoms and progress in treatment and Medication management  ASSESSMENT: Principal Problem:   Schizoaffective disorder, bipolar type ([place]) Active Problems:   Marijuana abuse   Moderate benzodiazepine use disorder (HCC)   Methamphetamine use disorder, mild (HCC)   Nicotine use disorder   Treatment Plan Summary: Daily contact with patient to assess and evaluate symptoms and progress in treatment   Physician Treatment Plan for Secondary Diagnosis: Principal Problem:   Schizoaffective disorder, bipolar type ([place]) Active Problems:   Marijuana abuse   Moderate benzodiazepine use disorder (HCC)   Methamphetamine use disorder, mild (HCC)   Nicotine use disorder    Assessment:   Diagnoses / Active Problems:   Safety and Monitoring: Involuntarily admission to inpatient psychiatric unit for safety, stabilization and treatment Daily contact with patient to assess and evaluate symptoms and progress in treatment Patient's case to be discussed in multi-disciplinary team meeting Observation Level : q15 minute checks Vital signs: q12 hours Precautions: suicide, elopement, and  assault   2. Psychiatric Diagnoses and Treatment # Schizoaffective disorder, bipolar type, current episode mixed - Increase to Risperdal 1 mg po qam and 2mg  qhs for residual manic behavior             -- Metabolic profile and EKG monitoring obtained while on an atypical antipsychotic  BMI: 20.08 Lipid Panel: WNL HbgA1c: 5.4% QTc: 357ms - Continue Cogentin 0.5 mg twice daily - Continue Depakote 1500 mg nightly              -  LFTs WNL, platelets normal              -We will obtain VPA level 8/7 AM with CBC and LFTs for trending --Initiate Ativan 1 mg po BID for restlessness and sleep and manic symptoms -- Continue Trazodone 100mg  qhs with extra 50mg  PRN for insomnia -- Encouraged patient to participate in unit milieu and in scheduled group therapies     #Nicotine use disorder #Cannabis use disorder #Benzodiazepine use disorder # Methamphetamine use disorder #Alcohol use disorder -Counseled on complete cessation of all substances -Ordered nicotine gum -CIWA protocol for monitoring of withdrawal with po thiamine and MVI replacement and Ativan 1mg  for scores >10 with MVI and thiamine oral replacement   3. Medical Issues Being Addressed:   # Elevated blood pressure --09/27/21: Initiate Norvasc 5 mg daily   # Subclinical hyperthyroidism TSH 0.290 (0.318 on admission 1 year ago, and 1.385 by the time of discharge) -Free T4 0.95 - Free T3 3.7 - Will repeat TSH on 8/7   #Urinary frequency - UA pending   4. Discharge Planning:              -- Social work and case management to assist with discharge planning and identification of hospital follow-up needs prior to discharge             -- Estimated LOS: 5-7 days             -- Discharge Concerns: Need to establish a safety plan; Medication compliance and effectiveness             -- Discharge Goals: Return home with outpatient referrals for mental health follow-up including medication management/psychotherapy    I certify that  inpatient services furnished can reasonably be expected to improve the patient's condition.     , FNP 09/27/2021, 2:33 PM

## 2021-09-28 LAB — SARS CORONAVIRUS 2 BY RT PCR: SARS Coronavirus 2 by RT PCR: NEGATIVE

## 2021-09-28 NOTE — Group Note (Signed)
Date:  09/28/2021 Time:  10:10 AM  Group Topic/Focus:  Orientation:   The focus of this group is to educate the patient on the purpose and policies of crisis stabilization and provide a format to answer questions about their admission.  The group details unit policies and expectations of patients while admitted.    Participation Level:  Active  Participation Quality:  Appropriate  Affect:  Appropriate  Cognitive:  Appropriate  Insight: Appropriate  Engagement in Group:  Engaged  Modes of Intervention:  Discussion  Additional Comments:     Reymundo Poll 09/28/2021, 10:10 AM

## 2021-09-28 NOTE — Progress Notes (Addendum)
[facility] MD Progress Note  09/28/2021 11:52 AM Claire Tolhurst  MRN:  [id number]  Subjective:  Jay Woods states, " I am happy because I know that I am safe."  Brief History: Jay Woods is a 21 year old male with a past psychiatric history of schizophrenia spectrum disorder, unspecified type, substance-induced mood disorder, multiple substance use disorders, and psychotic disorder who presented voluntarily from [facility] for paranoia, mania, and disorganized thoughts.   Yesterday's psychiatric team recommendation: Schizoaffective disorder, bipolar type, current episode mixed  Schizoaffective disorder, bipolar type, current episode mixed - Increase to Risperdal 1 mg po qam and 2mg  qhs for residual manic behavior         -- Metabolic profile and EKG monitoring obtained while on an atypical antipsychotic  BMI: 20.08 Lipid Panel: WNL HbgA1c: 5.4% QTc: 368ms - Continue Cogentin 0.5 mg twice daily - Continue Depakote 1500 mg nightly              -LFTs WNL, platelets normal              -We will obtain VPA level 8/7 AM with CBC and LFTs for trending --Initiate Ativan 1 mg po BID for restlessness and sleep and manic symptoms -- Continue Trazodone 100mg  qhs with extra 50mg  PRN for insomnia -- Encouraged patient to participate in unit milieu and in scheduled group therapies    Assessment: On assessment today, patient was seen face-to-face and examined in the office.       Appeared calm and pleasant this morning.  Chart reviewed and findings shared with the treatment team and consult with Dr. Nelda Marseille.  Alert and oriented and able to respond effectively to questions asked.    .  Maintained good eye contact with the provider during exam.  Speech clear with normal volume, pattern and less pressured. Presents with euthymic and less anxious mood. Thought process less scattered and with tangential thought content.  Memory and judgment fair, with insight shallow.  Participating in therapeutic  milieu and group activities in the unit.  Urine for urinalysis obtained yesterday due to frequency of urination. Result negative except for SG of 1.004. Monitoring I/O now and frequency of urine. Made patient aware to report to staff how much he drinks and using the bathroom. On 09/24/21, Serum Na+ 139, and K+ 4.2. Called Medicine Consult for ? Diabetes Insipidus. Dr. Marylyn Ishihara instruct this provider to monitor patient's symptoms and if it continues to call back. Orders written for serum/urine osmolarity, 24-hour urine sodium and potassium, canceled at this time. Please refer to DR. Kyle's Consult notes. Will obtain CMP on 09/29/21. Abnormal lab results from 09/25/2021 indicates CMP: Alkaline phosphatase 36, thyroid panel: TSH 0.290, will repeat level on 09/29/21; urine drug screen positive for marijuana.  Continues on Risperidone 2 mg p.o. tablets ordered at bedtime for mood and behavior, Ativan 1 mg p.o. twice daily ordered for restlessness and mania. Blood pressure is trending down after initiation of Norvasc 5 mg po yesterday BP 128/75, pulse 89, temperature 97.6 F (36.4 C), temperature source Oral, resp. rate 20, height 5\' 9"  (1.753 m), weight 61.7 kg, SpO2 99 %. Continues on Depakote 1500 mg po daily at bedtime for mood stabilization, will obtain VPA level in 09/29/21 in AM.  Patient denies suicidal ideation, homicidal ideation, paranoia, delusion, or visual/auditory hallucinations. Reports good appetite and consuming 100% of his meal.   Reports sleeping over 8 hours last night and feeling restful. When asked patient if he really slept for 8 hours last night stated, "I  think I did." Reports 1/10 anxiety, stating, "I am still thinking about the Asian girl I love and I hope to see her again one day." Nursing notes / my observation indicate less restlessness, hyperactivity, anxiety. Reports depression of  0/10 on a scale of 0-10, with 10 being the worst. Reports taking his prescribed medication as ordered without  observed adverse reactions.   Principal Problem: Schizoaffective disorder, bipolar type ([place])  Diagnosis: Principal Problem:   Schizoaffective disorder, bipolar type ([place]) Active Problems:   Marijuana abuse   Moderate benzodiazepine use disorder (HCC)   Methamphetamine use disorder, mild (HCC)   Nicotine use disorder  Total Time spent with patient: 30 minutes  Past Psychiatric History: Previous Psych Diagnoses: schizophrenia spectrum disorder, unspecified type, substance-induced mood disorder, multiple substance use disorders, and psychotic disorder Prior inpatient treatment: [facility] 02/2017, 11/2018, 05/2020. Current/prior outpatient treatment: Denies Prior rehab hx: Denies Psychotherapy hx: Denies History of suicide: Denies History of homicide: Denies Psychiatric medication history: Risperdal 2 mg every morning and 4 mg nightly, Depakote 1500 mg, Invega Sustenna 156 mg, Restoril 30 mg, Zyprexa 5 mg Psychiatric medication compliance history: Denies compliance after discharge Neuromodulation history: Denies Current Psychiatrist: Denies Current therapist: Denies   Substance Abuse Hx: Alcohol: Last drink 3 to 4 AM on 8/2 Tobacco: Uses nicotine vape Illicit drugs: Cocaine, K2, crystal meth, ecstasy Rx drug abuse: Roxicodone, Percocet, Xanax Rehab hx: Denies    Past Medical History:  Past Medical History:  Diagnosis Date   Eczema    History reviewed. No pertinent surgical history. Family History: History reviewed. No pertinent family history.  Family Psychiatric  History: None Indicated  Social History:  Social History   Substance and Sexual Activity  Alcohol Use Yes   Alcohol/week: 1.0 standard drink of alcohol   Types: 1 Cans of beer per week   Comment: twice/week     Social History   Substance and Sexual Activity  Drug Use Yes   Types: Marijuana    Social History   Socioeconomic History   Marital status: Single    Spouse name: Not on  file   Number of children: Not on file   Years of education: Not on file   Highest education level: Not on file  Occupational History   Not on file  Tobacco Use   Smoking status: Former    Packs/day: 1.00    Years: 5.00    Total pack years: 5.00    Types: Cigarettes   Smokeless tobacco: Former  Scientific laboratory technician Use: Some days   Substances: CBD  Substance and Sexual Activity   Alcohol use: Yes    Alcohol/week: 1.0 standard drink of alcohol    Types: 1 Cans of beer per week    Comment: twice/week   Drug use: Yes    Types: Marijuana   Sexual activity: Not Currently  Other Topics Concern   Not on file  Social History Narrative   Not on file   Social Determinants of Health   Financial Resource Strain: Not on file  Food Insecurity: Not on file  Transportation Needs: Not on file  Physical Activity: Not on file  Stress: Not on file  Social Connections: Not on file   Additional Social History:   Sleep: Good  Appetite:  Good  Current Medications: Current Facility-Administered Medications  Medication Dose Route Frequency Provider Last Rate Last Admin   acetaminophen (TYLENOL) tablet 650 mg  650 mg Oral Q6H PRN Evette Georges, NP  alum & mag hydroxide-simeth (MAALOX/MYLANTA) 200-200-20 MG/5ML suspension 30 mL  30 mL Oral Q4H PRN Sindy Guadeloupe, NP       amLODipine (NORVASC) tablet 5 mg  5 mg Oral Daily Ntuen, Jesusita Oka, FNP   5 mg at 09/28/21 0804   benztropine (COGENTIN) tablet 0.5 mg  0.5 mg Oral BID Lamar Sprinkles, MD   0.5 mg at 09/28/21 6270   divalproex (DEPAKOTE ER) 24 hr tablet 1,500 mg  1,500 mg Oral QHS Lamar Sprinkles, MD   1,500 mg at 09/27/21 2144   loperamide (IMODIUM) capsule 2-4 mg  2-4 mg Oral PRN Comer Locket, MD       risperiDONE (RISPERDAL M-TABS) disintegrating tablet 2 mg  2 mg Oral Q8H PRN Massengill, Harrold Donath, MD       And   LORazepam (ATIVAN) tablet 1 mg  1 mg Oral Q6H PRN Massengill, Harrold Donath, MD       And   ziprasidone (GEODON) injection 20  mg  20 mg Intramuscular Q6H PRN Massengill, Harrold Donath, MD       LORazepam (ATIVAN) tablet 1 mg  1 mg Oral BID Ntuen, Jesusita Oka, FNP   1 mg at 09/28/21 0803   LORazepam (ATIVAN) tablet 1 mg  1 mg Oral Q6H PRN Comer Locket, MD       magnesium hydroxide (MILK OF MAGNESIA) suspension 30 mL  30 mL Oral Daily PRN Sindy Guadeloupe, NP       multivitamin with minerals tablet 1 tablet  1 tablet Oral Daily Comer Locket, MD   1 tablet at 09/28/21 3500   nicotine polacrilex (NICORETTE) gum 2 mg  2 mg Oral Q4H while awake Lamar Sprinkles, MD   2 mg at 09/28/21 0805   ondansetron (ZOFRAN-ODT) disintegrating tablet 4 mg  4 mg Oral Q6H PRN Comer Locket, MD       risperiDONE (RISPERDAL) tablet 1 mg  1 mg Oral Daily Mason Jim, Adilynn Bessey E, MD   1 mg at 09/28/21 9381   risperiDONE (RISPERDAL) tablet 2 mg  2 mg Oral QHS Ntuen, Tina C, FNP   2 mg at 09/27/21 2144   thiamine (VITAMIN B1) tablet 100 mg  100 mg Oral Daily Mason Jim, Juri Dinning E, MD   100 mg at 09/28/21 0804   traZODone (DESYREL) tablet 100 mg  100 mg Oral QHS Lamar Sprinkles, MD   100 mg at 09/27/21 2144   And   traZODone (DESYREL) tablet 50 mg  50 mg Oral QHS PRN Lamar Sprinkles, MD   50 mg at 09/26/21 2313    Lab Results:  Results for orders placed or performed during the hospital encounter of 09/26/21 (from the past 48 hour(s))  Glucose, capillary     Status: Abnormal   Collection Time: 09/27/21 10:12 AM  Result Value Ref Range   Glucose-Capillary 107 (H) 70 - 99 mg/dL    Comment: Glucose reference range applies only to samples taken after fasting for at least 8 hours.  Urinalysis, Routine w reflex microscopic Urine, Clean Catch     Status: Abnormal   Collection Time: 09/27/21  7:20 PM  Result Value Ref Range   Color, Urine COLORLESS (A) YELLOW   APPearance CLEAR CLEAR   Specific Gravity, Urine 1.004 (L) 1.005 - 1.030   pH 8.0 5.0 - 8.0   Glucose, UA NEGATIVE NEGATIVE mg/dL   Hgb urine dipstick NEGATIVE NEGATIVE   Bilirubin Urine NEGATIVE NEGATIVE    Ketones, ur NEGATIVE NEGATIVE mg/dL   Protein, ur NEGATIVE  NEGATIVE mg/dL   Nitrite NEGATIVE NEGATIVE   Leukocytes,Ua NEGATIVE NEGATIVE    Comment: Performed at [facility], [address] [place], [place] [address]    Blood Alcohol level:  Lab Results  Component Value Date   [facility] <10 09/24/2021   ETH <10 06/09/2020    Metabolic Disorder Labs: Lab Results  Component Value Date   HGBA1C 5.4 09/24/2021   MPG 108.28 09/24/2021   MPG 114 06/09/2020   Lab Results  Component Value Date   PROLACTIN 4.4 06/09/2020   Lab Results  Component Value Date   CHOL 136 09/24/2021   TRIG 31 09/24/2021   HDL 49 09/24/2021   CHOLHDL 2.8 09/24/2021   VLDL 6 09/24/2021   LDLCALC 81 09/24/2021   LDLCALC 81 06/09/2020    Physical Findings: AIMS: Facial and Oral Movements Muscles of Facial Expression: None, normal Lips and Perioral Area: None, normal Jaw: None, normal Tongue: None, normal,Extremity Movements Upper (arms, wrists, hands, fingers): None, normal Lower (legs, knees, ankles, toes): None, normal, Trunk Movements Neck, shoulders, hips: None, normal, Overall Severity Severity of abnormal movements (highest score from questions above): None, normal Incapacitation due to abnormal movements: None, normal Patient's awareness of abnormal movements (rate only patient's report): No Awareness, Dental Status Current problems with teeth and/or dentures?: No Does patient usually wear dentures?: No  CIWA:  CIWA-Ar Total: 3 COWS:     Musculoskeletal: Strength & Muscle Tone: within normal limits Gait & Station: normal Patient leans: N/A  Psychiatric Specialty Exam:  Presentation  General Appearance: Appropriate for Environment; Casual; Fairly Groomed  Eye Contact:Good  Speech:Clear and Coherent; Normal Rate  Speech Volume:Normal  Handedness:Ambidextrous  Mood and Affect  Mood:calmer,less anxious  Affect:pleasant  Thought Process  Thought  Processes:less scattered but tangential  Descriptions of Associations:Tangential  Orientation:Full (Time, Place and Person)  Thought Content:grandiose, scattered  History of Schizophrenia/Schizoaffective disorder:Yes  Duration of Psychotic Symptoms:Greater than six months  Hallucinations:Hallucinations: None Description of Visual Hallucinations: denies  Ideas of Reference:None  Suicidal Thoughts:Suicidal Thoughts: No  Homicidal Thoughts:Homicidal Thoughts: No  Sensorium  Memory:Immediate Fair; Recent Fair; Remote Fair  Judgment:Fair  Insight:Shallow  Executive Functions  Concentration:Fair  Attention Span:Fair  Recall:Fair  Fund of Knowledge:Fair  Language:Fair  Psychomotor Activity  Psychomotor Activity:Psychomotor Activity: Normal  Assets  Assets:Desire for Improvement; Physical Health; Social Support  Sleep  Sleep:total time unrecorded  Physical Exam Vitals and nursing note reviewed.  HENT:     Head: Normocephalic and atraumatic.     Right Ear: External ear normal.     Left Ear: External ear normal.     Nose: Nose normal.     Mouth/Throat:     Mouth: Mucous membranes are moist.     Pharynx: Oropharynx is clear.  Eyes:     Extraocular Movements: Extraocular movements intact.     Conjunctiva/sclera: Conjunctivae normal.     Pupils: Pupils are equal, round, and reactive to light.  Cardiovascular:     Rate and Rhythm: Normal rate.     Pulses: Normal pulses.  Pulmonary:     Effort: Pulmonary effort is normal.  Abdominal:     Palpations: Abdomen is soft.  Genitourinary:    Comments: deferred Musculoskeletal:        General: Normal range of motion.     Cervical back: Normal range of motion and neck supple.  Skin:    General: Skin is warm.  Neurological:     General: No focal deficit present.     Mental  Status: He is oriented to person, place, and time.  Psychiatric:     Comments: Grandiose and manic.     Review of Systems   Constitutional: Negative.  Negative for chills and fever.  HENT:  Negative for ear pain, hearing loss and tinnitus.   Eyes: Negative.  Negative for blurred vision and double vision.  Respiratory: Negative.  Negative for cough, sputum production and wheezing.   Cardiovascular: Negative.  Negative for chest pain and palpitations.  Gastrointestinal: Negative.  Negative for abdominal pain, constipation, diarrhea, heartburn, nausea and vomiting.  Genitourinary:  Positive for frequency. Negative for dysuria and urgency.  Musculoskeletal: Negative.  Negative for myalgias and neck pain.  Skin: Negative.  Negative for itching and rash.  Neurological: Negative.  Negative for dizziness, tingling and headaches.  Endo/Heme/Allergies: Negative.  Negative for environmental allergies and polydipsia. Does not bruise/bleed easily.  Psychiatric/Behavioral:  Positive for substance abuse.    Blood pressure 128/75, pulse 89, temperature 97.6 F (36.4 C), temperature source Oral, resp. rate 20, height 5\' 9"  (1.753 m), weight 61.7 kg, SpO2 99 %. Body mass index is 20.08 kg/m.   Treatment Plan Summary: Daily contact with patient to assess and evaluate symptoms and progress in treatment and Medication management  ASSESSMENT: Principal Problem:   Schizoaffective disorder, bipolar type ([place]) Active Problems:   Marijuana abuse   Moderate benzodiazepine use disorder (HCC)   Methamphetamine use disorder, mild (HCC)   Nicotine use disorder   Treatment Plan Summary: Daily contact with patient to assess and evaluate symptoms and progress in treatment   Physician Treatment Plan for Secondary Diagnosis: Principal Problem:   Schizoaffective disorder, bipolar type ([place]) Active Problems:   Marijuana abuse   Moderate benzodiazepine use disorder (HCC)   Methamphetamine use disorder, mild (HCC)   Nicotine use disorder  Safety and Monitoring: Involuntarily admission to inpatient psychiatric unit for safety,  stabilization and treatment Daily contact with patient to assess and evaluate symptoms and progress in treatment Patient's case to be discussed in multi-disciplinary team meeting Observation Level : q15 minute checks Vital signs: q12 hours Precautions: suicide, elopement, and assault   2. Psychiatric Diagnoses and Treatment # Schizoaffective disorder, bipolar type, current episode mixed - Continue Risperdal 1 mg po qam and 2mg  qhs for residual manic behavior             -- Metabolic profile and EKG monitoring obtained while on an atypical antipsychotic  BMI: 20.08 Lipid Panel: WNL HbgA1c: 5.4% QTc: 316ms - Continue Cogentin 0.5 mg twice daily - Continue Depakote 1500 mg nightly              -LFTs WNL, platelets normal              -We will obtain VPA level 8/7 AM with CBC and CMP for trending --Continue Ativan 1 mg po BID for restlessness and sleep and manic symptoms -- Continue Trazodone 100mg  qhs with extra 50mg  PRN for insomnia -- Encouraged patient to participate in unit milieu and in scheduled group therapies     #Nicotine use disorder #Cannabis use disorder #Benzodiazepine use disorder # Methamphetamine use disorder #Alcohol use disorder -Counseled on complete cessation of all substances -Ordered nicotine gum -CIWA protocol for monitoring of withdrawal with po thiamine and MVI replacement and Ativan 1mg  for scores >10 with MVI and thiamine oral replacement   3. Medical Issues Being Addressed: # Elevated blood pressure --09/27/21: Initiated Norvasc 5 mg daily   # Subclinical hyperthyroidism TSH 0.290 (0.318 on admission 1  year ago, and 1.385 by the time of discharge) -Free T4 0.95 - Free T3 3.7 - Will repeat TSH on 8/7   #Urinary frequency - Monitoring I/O for signs of excessive thirst/urination - repeat CMP pending - UA WNL other than SG 1.004 and Na+ 139 - Consulted IM for questionable DI workup which they feel is outpatient w/u if needed   4. Discharge Planning:               -- Social work and case management to assist with discharge planning and identification of hospital follow-up needs prior to discharge             -- Estimated LOS: 5-7 days             -- Discharge Concerns: Need to establish a safety plan; Medication compliance and effectiveness             -- Discharge Goals: Return home with outpatient referrals for mental health follow-up including medication management/psychotherapy    I certify that inpatient services furnished can reasonably be expected to improve the patient's condition.     Laretta Bolster, FNP 09/28/2021, 11:52 AMPatient ID: Jay Woods, male   DOB: [birth year], 21 y.o.   MRN: [id number]

## 2021-09-28 NOTE — Group Note (Signed)
Date:  09/28/2021 Time:  5:37 PM  Group Topic/Focus:  Making Healthy Choices:   The focus of this group is to help patients identify negative/unhealthy choices they were using prior to admission and identify positive/healthier coping strategies to replace them upon discharge.    Participation Level:  Active  Participation Quality:  Appropriate  Affect:  Appropriate  Cognitive:  Appropriate  Insight: Appropriate  Engagement in Group:  Engaged  Modes of Intervention:  Socialization  Additional Comments:     Reymundo Poll 09/28/2021, 5:37 PM

## 2021-09-28 NOTE — Group Note (Signed)
  [facility] LCSW Group Therapy Note  Date/Time:  09/28/2021 10:00AM-11:00AM  Type of Therapy and Topic:  Group Therapy:  Ways to Love Myself and Take Care of Myself  Participation Level:  Did Not Attend   Description of Group This process group started with group leader playing a song entitled "Love Me More" to facilitate a discussion about the need to love and respect ourselves and prioritize taking care of ourselves, especially after hospital discharge.   There was a discussion about the need for self-love, and patients listed ways in which they can demonstrate self-love.  Patients were then asked to share how they plan to take care of themselves in a better manner when they get home from the hospital.  Group members shared ideas about making changes when they return home so that they can stay well and in recovery.  Two more songs were played including "I Am Enough" and "My Own Hero" which led to further reinforcement of the ideas given.  Patients were emotional and/or supportive of those who became emotional.  Therapeutic Goals Patient will listen and be able to relate to a song about prioritizing themselves through self-love Patient will participate in generating ideas about healthy self-care options when they return to the community Patients will be supportive of one another and receive said support from others   Summary of Patient Progress:  The patient did not attend group   Therapeutic Modalities Activity Motivational Interviewing Processing   Ambrose Mantle, LCSW 09/28/2021, 12:00pm

## 2021-09-28 NOTE — Progress Notes (Addendum)
Called by [facility] Staff for concern of low specific gravity on a UA draw after noticing patient with polyuria. Recent lab work from 09/24/21 w/o hyponatremia. New CMP pending. In any case, this would be an outpt w/u w/ PCP/endocrine. Referred to their services. Thank you.   Teddy Spike, DO

## 2021-09-28 NOTE — Progress Notes (Signed)
The patient rated his day as a 10 out of 10 since he was sociable with his peers. He learned to play chess with his peers and read from the Bible.

## 2021-09-28 NOTE — Progress Notes (Signed)
   09/28/21 1100  Charting Type  Charting Type Shift assessment  Safety Check Verification  Has the RN verified the 15 minute safety check completion? Yes  Neurological  Neuro (WDL) WDL  HEENT  HEENT (WDL) WDL  Respiratory  Respiratory (WDL) WDL  Cardiac  Cardiac (WDL) WDL  Vascular  Vascular (WDL) WDL  Integumentary  Integumentary (WDL) WDL  Braden Scale (Ages 8 and up)  Sensory Perceptions 4  Moisture 4  Activity 4  Mobility 4  Nutrition 3  Friction and Shear 3  Braden Scale Score 22  Musculoskeletal  Musculoskeletal (WDL) WDL  Gastrointestinal  Gastrointestinal (WDL) WDL  Neurological  Level of Consciousness Alert

## 2021-09-29 LAB — COMPREHENSIVE METABOLIC PANEL
ALT: 20 U/L (ref 0–44)
AST: 14 U/L — ABNORMAL LOW (ref 15–41)
Albumin: 4.4 g/dL (ref 3.5–5.0)
Alkaline Phosphatase: 35 U/L — ABNORMAL LOW (ref 38–126)
Anion gap: 7 (ref 5–15)
BUN: 12 mg/dL (ref 6–20)
CO2: 25 mmol/L (ref 22–32)
Calcium: 9.3 mg/dL (ref 8.9–10.3)
Chloride: 108 mmol/L (ref 98–111)
Creatinine, Ser: 0.76 mg/dL (ref 0.61–1.24)
GFR, Estimated: >60 mL/min (ref >60)
Glucose, Bld: 99 mg/dL (ref 70–99)
Potassium: 3.8 mmol/L (ref 3.5–5.1)
Sodium: 140 mmol/L (ref 135–145)
Total Bilirubin: 0.3 mg/dL (ref 0.3–1.2)
Total Protein: 7.7 g/dL (ref 6.5–8.1)

## 2021-09-29 LAB — CBC WITH DIFFERENTIAL/PLATELET
Abs Immature Granulocytes: 0.02 10*3/uL (ref 0.00–0.07)
Basophils Absolute: 0.1 10*3/uL (ref 0.0–0.1)
Basophils Relative: 1 %
Eosinophils Absolute: 0.1 10*3/uL (ref 0.0–0.5)
Eosinophils Relative: 2 %
HCT: 41.9 % (ref 39.0–52.0)
Hemoglobin: 13.8 g/dL (ref 13.0–17.0)
Immature Granulocytes: 0 %
Lymphocytes Relative: 28 %
Lymphs Abs: 2.1 10*3/uL (ref 0.7–4.0)
MCH: 30.9 pg (ref 26.0–34.0)
MCHC: 32.9 g/dL (ref 30.0–36.0)
MCV: 93.7 fL (ref 80.0–100.0)
Monocytes Absolute: 0.7 10*3/uL (ref 0.1–1.0)
Monocytes Relative: 9 %
Neutro Abs: 4.4 10*3/uL (ref 1.7–7.7)
Neutrophils Relative %: 60 %
Platelets: 271 10*3/uL (ref 150–400)
RBC: 4.47 MIL/uL (ref 4.22–5.81)
RDW: 13.2 % (ref 11.5–15.5)
WBC: 7.3 10*3/uL (ref 4.0–10.5)
nRBC: 0 % (ref 0.0–0.2)

## 2021-09-29 LAB — TSH: TSH: 0.724 u[IU]/mL (ref 0.350–4.500)

## 2021-09-29 LAB — VALPROIC ACID LEVEL: Valproic Acid Lvl: 81 ug/mL (ref 50.0–100.0)

## 2021-09-29 NOTE — Progress Notes (Signed)
Strict I&O initiated. Pt provided with a 32 ounce pitcher of H20 and has agreed to only drink from his pitcher, and to let staff know when he needs it refilled. Pt was also provided with a 'hat' for collecting urine. So far, pt has been compliant with letting staff know when he's used the bathroom.

## 2021-09-29 NOTE — Progress Notes (Signed)
D.Patient has been calm and cooperative on the unit- rated his depression,hopelessness and anxiety all 0's on his self inventory. Pt wrote that his goal today was to work on "having fun and my prayer for my family and everyone's family. Keep my fait and hope". And wrote for the staff: "God bless staff for helping and making sure we okay." Pt has been compliant with keeping track of I & O's today. Pt denies SI/HI and AVH   A. Labs and vitals monitored. Pt given and educated on medications. Pt supported emotionally and encouraged to express concerns and ask questions.   R. Pt remains safe with 15 minute checks. Will continue POC.

## 2021-09-29 NOTE — Progress Notes (Signed)
[facility] MD Progress Note  09/29/2021 12:39 PM Jay Woods  MRN:  [id number]  Subjective:  Jay Woods is a 21 year old male with a past psychiatric history of schizophrenia spectrum disorder, unspecified type, substance-induced mood disorder, multiple substance use disorders, and psychotic disorder who presented voluntarily from [facility] for paranoia, mania, and disorganized thoughts.   Yesterday's psychiatric team recommendation: - Continue Risperdal 1 mg po qam and 2mg  qhs for residual manic behavior - Continue Cogentin 0.5 mg twice daily - Continue Depakote 1500 mg nightly  -- Continue Ativan 1 mg po BID for restlessness and sleep and manic symptoms -- Continue Trazodone 100mg  qhs with extra 50mg  PRN for insomnia    On evaluation today: Patient reports having a good weekend and doing well today.  He is more appropriate in conversation, less tangential in speech and less scattered in his thought process.  Patient reports having a good conversation with his dad in which the 2 discussed him coming to live with dad upon discharge.  Patient says that his "spidey senses" told him not to stay in [place], as he knows a lot of people and is unsure of their motives.  He denies paranoia but says that he would just rather be safe.  Patient denies adverse effects of medications, and reports improved sleep as well as intact appetite.  He reports being fixated with the shower, but does not feel that 2-3 showers daily is unreasonable.  We discussed over hydrating with p.o. water intake, and patient voices understanding.  Patient denies any acute concerns or complaints today, and denies somatic symptoms.  He denies SI/HI/AVH as well.  Principal Problem: Schizoaffective disorder, bipolar type (HCC)  Diagnosis: Principal Problem:   Schizoaffective disorder, bipolar type (HCC) Active Problems:   Marijuana abuse   Moderate benzodiazepine use disorder (HCC)   Methamphetamine use disorder, mild  (HCC)   Nicotine use disorder  Total Time spent with patient: 30 minutes  Past Psychiatric History: Previous Psych Diagnoses: schizophrenia spectrum disorder, unspecified type, substance-induced mood disorder, multiple substance use disorders, and psychotic disorder [facility]/2020. Current/prior outpatient treatment: Denies Prior rehab hx: Denies Psychotherapy hx: Denies History of suicide: Denies History of homicide: Denies Psychiatric medication history: Risperdal 2 mg every morning and 4 mg nightly, Depakote 1500 mg, Invega Sustenna 156 mg, Restoril 30 mg, Zyprexa 5 mg Psychiatric medication compliance history: Denies compliance after discharge Neuromodulation history: Denies Current Psychiatrist: Denies Current therapist: Denies   Substance Abuse Hx: Alcohol: Last drink 3 to 4 AM on 8/2 Tobacco: Uses nicotine vape Illicit drugs: Cocaine, K2, crystal meth, ecstasy Rx drug abuse: Roxicodone, Percocet, Xanax Rehab hx: Denies    Past Medical History:  Past Medical History:  Diagnosis Date   Eczema    History reviewed. No pertinent surgical history. Family History: History reviewed. No pertinent family history.  Family Psychiatric  History: None Indicated  Social History:  Social History   Substance and Sexual Activity  Alcohol Use Yes   Alcohol/week: 1.0 standard drink of alcohol   Types: 1 Cans of beer per week   Comment: twice/week     Social History   Substance and Sexual Activity  Drug Use Yes   Types: Marijuana    Social History   Socioeconomic History   Marital status: Single    Spouse name: Not on file   Number of children: Not on file   Years of education: Not on file   Highest education level: Not on file  Occupational History   Not on file  Tobacco Use   Smoking status: Former    Packs/day: 1.00    Years: 5.00    Total pack years: 5.00    Types: Cigarettes   Smokeless tobacco:  Former  Scientific laboratory technician Use: Some days   Substances: CBD  Substance and Sexual Activity   Alcohol use: Yes    Alcohol/week: 1.0 standard drink of alcohol    Types: 1 Cans of beer per week    Comment: twice/week   Drug use: Yes    Types: Marijuana   Sexual activity: Not Currently  Other Topics Concern   Not on file  Social History Narrative   Not on file   Social Determinants of Health   Financial Resource Strain: Not on file  Food Insecurity: Not on file  Transportation Needs: Not on file  Physical Activity: Not on file  Stress: Not on file  Social Connections: Not on file   Additional Social History:   Sleep: Good  Appetite:  Good  Current Medications: Current Facility-Administered Medications  Medication Dose Route Frequency Provider Last Rate Last Admin   acetaminophen (TYLENOL) tablet 650 mg  650 mg Oral Q6H PRN Evette Georges, NP       alum & mag hydroxide-simeth (MAALOX/MYLANTA) 200-200-20 MG/5ML suspension 30 mL  30 mL Oral Q4H PRN Evette Georges, NP       amLODipine (NORVASC) tablet 5 mg  5 mg Oral Daily Ntuen, Kris Hartmann, FNP   5 mg at 09/29/21 0916   benztropine (COGENTIN) tablet 0.5 mg  0.5 mg Oral BID Rosezetta Schlatter, MD   0.5 mg at 09/29/21 0916   divalproex (DEPAKOTE ER) 24 hr tablet 1,500 mg  1,500 mg Oral QHS Rosezetta Schlatter, MD   1,500 mg at 09/28/21 2127   loperamide (IMODIUM) capsule 2-4 mg  2-4 mg Oral PRN Harlow Asa, MD       risperiDONE (RISPERDAL M-TABS) disintegrating tablet 2 mg  2 mg Oral Q8H PRN Massengill, Ovid Curd, MD       And   LORazepam (ATIVAN) tablet 1 mg  1 mg Oral Q6H PRN Massengill, Ovid Curd, MD       And   ziprasidone (GEODON) injection 20 mg  20 mg Intramuscular Q6H PRN Massengill, Nathan, MD       LORazepam (ATIVAN) tablet 1 mg  1 mg Oral BID Ntuen, Tina C, FNP   1 mg at 09/29/21 0916   LORazepam (ATIVAN) tablet 1 mg  1 mg Oral Q6H PRN Harlow Asa, MD       magnesium hydroxide (MILK OF MAGNESIA) suspension 30 mL  30 mL Oral  Daily PRN Evette Georges, NP       multivitamin with minerals tablet 1 tablet  1 tablet Oral Daily Harlow Asa, MD   1 tablet at 09/29/21 0916   nicotine polacrilex (NICORETTE) gum 2 mg  2 mg Oral Q4H while awake Rosezetta Schlatter, MD   2 mg at 09/29/21 0626   ondansetron (ZOFRAN-ODT) disintegrating tablet 4 mg  4 mg Oral Q6H PRN Harlow Asa, MD       risperiDONE (RISPERDAL) tablet 1 mg  1 mg Oral Daily Nelda Marseille, Amy E, MD   1 mg at 09/29/21 0915   risperiDONE (RISPERDAL) tablet 2 mg  2 mg Oral QHS Ntuen, Tina C, FNP   2 mg at 09/28/21 2127   thiamine (VITAMIN B1) tablet 100 mg  100 mg Oral Daily Nelda Marseille, Amy E,  MD   100 mg at 09/29/21 0915   traZODone (DESYREL) tablet 100 mg  100 mg Oral QHS Lamar Sprinkles, MD   100 mg at 09/28/21 2127   And   traZODone (DESYREL) tablet 50 mg  50 mg Oral QHS PRN Lamar Sprinkles, MD   50 mg at 09/26/21 2313    Lab Results:  Results for orders placed or performed during the hospital encounter of 09/26/21 (from the past 48 hour(s))  Urinalysis, Routine w reflex microscopic Urine, Clean Catch     Status: Abnormal   Collection Time: 09/27/21  7:20 PM  Result Value Ref Range   Color, Urine COLORLESS (A) YELLOW   APPearance CLEAR CLEAR   Specific Gravity, Urine 1.004 (L) 1.005 - 1.030   pH 8.0 5.0 - 8.0   Glucose, UA NEGATIVE NEGATIVE mg/dL   Hgb urine dipstick NEGATIVE NEGATIVE   Bilirubin Urine NEGATIVE NEGATIVE   Ketones, ur NEGATIVE NEGATIVE mg/dL   Protein, ur NEGATIVE NEGATIVE mg/dL   Nitrite NEGATIVE NEGATIVE   Leukocytes,Ua NEGATIVE NEGATIVE    Comment: Performed at [facility], [address], [place], [place] [address]  SARS Coronavirus 2 by RT PCR (hospital order, performed in [facility] hospital lab) *cepheid single result test* Anterior Nasal Swab     Status: None   Collection Time: 09/28/21 10:35 AM   Specimen: Anterior Nasal Swab  Result Value Ref Range   SARS Coronavirus 2 by RT PCR NEGATIVE NEGATIVE     Comment: (NOTE) SARS-CoV-2 target nucleic acids are NOT DETECTED.  The SARS-CoV-2 RNA is generally detectable in upper and lower respiratory specimens during the acute phase of infection. The lowest concentration of SARS-CoV-2 viral copies this assay can detect is 250 copies / mL. A negative result does not preclude SARS-CoV-2 infection and should not be used as the sole basis for treatment or other patient management decisions.  A negative result may occur with improper specimen collection / handling, submission of specimen other than nasopharyngeal swab, presence of viral mutation(s) within the areas targeted by this assay, and inadequate number of viral copies (<250 copies / mL). A negative result must be combined with clinical observations, patient history, and epidemiological information.  Fact Sheet for Patients:   RoadLapTop.co.za  Fact Sheet for Healthcare Providers: http://kim-miller.com/  This test is not yet approved or  cleared by the [place] FDA and has been authorized for detection and/or diagnosis of SARS-CoV-2 by FDA under an Emergency Use Authorization (EUA).  This EUA will remain in effect (meaning this test can be used) for the duration of the COVID-19 declaration under Section 564(b)(1) of the Act, 21 U.S.C. section 360bbb-3(b)(1), unless the authorization is terminated or revoked sooner.  Performed at [facility], [address], [place], [place] [address]   Valproic acid level     Status: None   Collection Time: 09/29/21  6:23 AM  Result Value Ref Range   Valproic Acid Lvl 81 50.0 - 100.0 ug/mL    Comment: Performed at [facility], [address] [place], [place] [address]  TSH     Status: None   Collection Time: 09/29/21  6:23 AM  Result Value Ref Range   TSH 0.724 0.350 - 4.500 uIU/mL    Comment: Performed by a 3rd Generation assay with a functional sensitivity  of <=0.01 uIU/mL. Performed at [facility], [address], [place], [place] [address]   Comprehensive metabolic panel     Status:  Abnormal   Collection Time: 09/29/21  6:23 AM  Result Value Ref Range   Sodium 140 135 - 145 mmol/L   Potassium 3.8 3.5 - 5.1 mmol/L   Chloride 108 98 - 111 mmol/L   CO2 25 22 - 32 mmol/L   Glucose, Bld 99 70 - 99 mg/dL    Comment: Glucose reference range applies only to samples taken after fasting for at least 8 hours.   BUN 12 6 - 20 mg/dL   Creatinine, Ser 0.76 0.61 - 1.24 mg/dL   Calcium 9.3 8.9 - 10.3 mg/dL   Total Protein 7.7 6.5 - 8.1 g/dL   Albumin 4.4 3.5 - 5.0 g/dL   AST 14 (L) 15 - 41 U/L   ALT 20 0 - 44 U/L   Alkaline Phosphatase 35 (L) 38 - 126 U/L   Total Bilirubin 0.3 0.3 - 1.2 mg/dL   GFR, Estimated >60 >60 mL/min    Comment: (NOTE) Calculated using the CKD-EPI Creatinine Equation (2021)    Anion gap 7 5 - 15    Comment: Performed at [facility], [place] [address], [place], [place] [address]    Blood Alcohol level:  Lab Results  Component Value Date   [facility] <10 09/24/2021   ETH <10 [id number]    Metabolic Disorder Labs: Lab Results  Component Value Date   HGBA1C 5.4 09/24/2021   MPG 108.28 09/24/2021   MPG 114 06/09/2020   Lab Results  Component Value Date   PROLACTIN 4.4 06/09/2020   Lab Results  Component Value Date   CHOL 136 09/24/2021   TRIG 31 09/24/2021   HDL 49 09/24/2021   CHOLHDL 2.8 09/24/2021   VLDL 6 09/24/2021   LDLCALC 81 09/24/2021   LDLCALC 81 06/09/2020    Physical Findings: AIMS: Facial and Oral Movements Muscles of Facial Expression: None, normal Lips and Perioral Area: None, normal Jaw: None, normal Tongue: None, normal,Extremity Movements Upper (arms, wrists, hands, fingers): None, normal Lower (legs, knees, ankles, toes): None, normal, Trunk Movements Neck, shoulders, hips: None, normal, Overall Severity Severity of abnormal movements (highest score  from questions above): None, normal Incapacitation due to abnormal movements: None, normal Patient's awareness of abnormal movements (rate only patient's report): No Awareness, Dental Status Current problems with teeth and/or dentures?: No Does patient usually wear dentures?: No  CIWA:  CIWA-Ar Total: 0  Musculoskeletal: Strength & Muscle Tone: within normal limits Gait & Station: normal Patient leans: N/A  Psychiatric Specialty Exam:  Presentation  General Appearance: Appropriate for Environment; Casual; Fairly Groomed  Eye Contact:Good  Speech:Clear and Coherent; Normal Rate  Speech Volume:Normal  Handedness:Ambidextrous  Mood and Affect  Mood:calmer,less anxious  Affect:pleasant  Thought Process  Thought Processes:less scattered and tangential  Descriptions of Associations:Tangential  Orientation:Full (Time, Place and Person)  Thought Content:grandiose, scattered  History of Schizophrenia/Schizoaffective disorder:Yes  Duration of Psychotic Symptoms:Greater than six months  Hallucinations:Hallucinations: None Description of Visual Hallucinations: denies  Ideas of Reference:None  Suicidal Thoughts:Suicidal Thoughts: No  Homicidal Thoughts:Homicidal Thoughts: No  Sensorium  Memory:Immediate Fair; Recent Fair; Remote Fair  Judgment:Fair  Insight:Shallow  Executive Functions  Concentration:Fair  Attention Span:Fair  [place]  Psychomotor Activity  Psychomotor Activity:Psychomotor Activity: Normal  Assets  Assets:Desire for Improvement; Physical Health; Social Support  Sleep  Sleep: 6.25 hours  Physical Exam Vitals and nursing note reviewed.  HENT:     Head: Normocephalic and atraumatic.     Nose: Nose normal.  Mouth/Throat:     Mouth: Mucous membranes are moist.     Pharynx: Oropharynx is clear.  Eyes:     Extraocular Movements: Extraocular movements intact.     Conjunctiva/sclera:  Conjunctivae normal.     Pupils: Pupils are equal, round, and reactive to light.  Pulmonary:     Effort: Pulmonary effort is normal.  Musculoskeletal:        General: Normal range of motion.  Skin:    General: Skin is warm.  Neurological:     General: No focal deficit present.     Mental Status: He is oriented to person, place, and time.  Psychiatric:     Comments:      Review of Systems  Constitutional: Negative.  Negative for chills and fever.  HENT:  Negative for ear pain, hearing loss and tinnitus.   Eyes: Negative.  Negative for blurred vision and double vision.  Respiratory: Negative.  Negative for cough, sputum production and wheezing.   Cardiovascular: Negative.  Negative for chest pain and palpitations.  Gastrointestinal: Negative.  Negative for abdominal pain, constipation, diarrhea, heartburn, nausea and vomiting.  Genitourinary:  Positive for frequency. Negative for dysuria and urgency.  Musculoskeletal: Negative.  Negative for myalgias and neck pain.  Skin: Negative.  Negative for itching and rash.  Neurological: Negative.  Negative for dizziness, tingling and headaches.  Endo/Heme/Allergies: Negative.  Negative for environmental allergies and polydipsia. Does not bruise/bleed easily.  Psychiatric/Behavioral:  Positive for substance abuse.    Blood pressure (!) 144/81, pulse 97, temperature 97.8 F (36.6 C), temperature source Oral, resp. rate 16, height 5\' 9"  (1.753 m), weight 61.7 kg, SpO2 99 %. Body mass index is 20.08 kg/m.   Treatment Plan Summary: Daily contact with patient to assess and evaluate symptoms and progress in treatment and Medication management  ASSESSMENT: Principal Problem:   Schizoaffective disorder, bipolar type ([place]) Active Problems:   Marijuana abuse   Moderate benzodiazepine use disorder (HCC)   Methamphetamine use disorder, mild (HCC)   Nicotine use disorder   Treatment Plan Summary: Daily contact with patient to assess and  evaluate symptoms and progress in treatment   Physician Treatment Plan for Secondary Diagnosis: Principal Problem:   Schizoaffective disorder, bipolar type ([place]) Active Problems:   Marijuana abuse   Moderate benzodiazepine use disorder (HCC)   Methamphetamine use disorder, mild (HCC)   Nicotine use disorder  Safety and Monitoring: Involuntarily admission to inpatient psychiatric unit for safety, stabilization and treatment Daily contact with patient to assess and evaluate symptoms and progress in treatment Patient's case to be discussed in multi-disciplinary team meeting Observation Level : q15 minute checks Vital signs: q12 hours Precautions: suicide, elopement, and assault   2. Psychiatric Diagnoses and Treatment # Schizoaffective disorder, bipolar type, current episode mixed - Continue Risperdal 1 mg po qam and 2mg  qhs for residual manic behavior             -- Metabolic profile and EKG monitoring obtained while on an atypical antipsychotic  BMI: 20.08 Lipid Panel: WNL HbgA1c: 5.4% QTc: 351ms - Continue Cogentin 0.5 mg twice daily - Continue Depakote 1500 mg nightly              -LFTs WNL, platelets normal              -VPA level 81, LFTs WNL --Continue Ativan 1 mg po BID for restlessness and sleep and manic symptoms -- Continue Trazodone 100mg  qhs with extra 50mg  PRN for insomnia -- Encouraged patient to participate  in unit milieu and in scheduled group therapies     #Nicotine use disorder #Cannabis use disorder #Benzodiazepine use disorder # Methamphetamine use disorder #Alcohol use disorder -Counseled on complete cessation of all substances -Ordered nicotine gum -CIWA protocol for monitoring of withdrawal with po thiamine and MVI replacement and Ativan 1mg  for scores >10 with MVI and thiamine oral replacement   3. Medical Issues Being Addressed: # Elevated blood pressure -Continue Norvasc 5 mg daily, started 8/5   # Subclinical hyperthyroidism, resolved TSH  0.290 (0.318 on admission 1 year ago, and 1.385 by the time of discharge) -Free T4 0.95 - Free T3 3.7 - Repeat TSH 0.724  #Urinary frequency - Monitoring I/O for signs of excessive thirst/urination - repeat CMP pending - UA WNL other than SG 1.004 and Na+ 139 - Consulted IM for questionable DI workup which they feel is outpatient w/u if needed   4. Discharge Planning:              -- Social work and case management to assist with discharge planning and identification of hospital follow-up needs prior to discharge             -- Estimated LOS: 5-7 days             -- Discharge Concerns: Need to establish a safety plan; Medication compliance and effectiveness             -- Discharge Goals: Return home with outpatient referrals for mental health follow-up including medication management/psychotherapy    I certify that inpatient services furnished can reasonably be expected to improve the patient's condition.     Rosezetta Schlatter, MD 09/29/2021, 12:39 PM

## 2021-09-29 NOTE — Progress Notes (Signed)
Pt rates depression 0/10 and anxiety 0/10. Pt reports a good appetite, and no physical problems. Pt denies SI/HI/AVH and verbally contracts for safety. Provided support and encouragement. Pt safe on the unit. Q 15 minute safety checks continued.   

## 2021-09-29 NOTE — [facility] Group Notes (Signed)
Adult Psychoeducational Group Note  Date:  09/29/2021 Time:  9:37 AM  Group Topic/Focus:  Goals Group:   The focus of this group is to help patients establish daily goals to achieve during treatment and discuss how the patient can incorporate goal setting into their daily lives to aide in recovery.  Participation Level:  Active  Participation Quality:  Intrusive and Redirectable  Affect:  Excited  Cognitive:  Disorganized  Insight: Lacking  Engagement in Group:  Off Topic  Modes of Intervention:  Clarification  Additional Comments:  Pt says that his goal is to get better to take care of his Mom and kids. He also said that he wants to do better for this girl that he loves but hates.  Donell Beers 09/29/2021, 9:37 AM

## 2021-09-29 NOTE — Progress Notes (Signed)
Intake and Output for the shift:  Intake: 3312 cc  Output: 2275 cc

## 2021-09-29 NOTE — [facility] Suicide Risk Assessment (Signed)
[facility] INPATIENT:  Family/Significant Other Suicide Prevention Education  Suicide Prevention Education:  Education Completed; Jay Woods [phone] (Mother) has been identified by the patient as the family member/significant other with whom the patient will be residing, and identified as the person(s) who will aid the patient in the event of a mental health crisis (suicidal ideations/suicide attempt).  With written consent from the patient, the family member/significant other has been provided the following suicide prevention education, prior to the and/or following the discharge of the patient.  The suicide prevention education provided includes the following: Suicide risk factors Suicide prevention and interventions National Suicide Hotline telephone number [facility] assessment telephone number [facility] Emergency Assistance 911 [facility] and/or Residential Mobile Crisis Unit telephone number  Request made of family/significant other to: Remove weapons (e.g., guns, rifles, knives), all items previously/currently identified as safety concern.   Remove drugs/medications (over-the-counter, prescriptions, illicit drugs), all items previously/currently identified as a safety concern.  The family member/significant other verbalizes understanding of the suicide prevention education information provided.  The family member/significant other agrees to remove the items of safety concern listed above.  CSW spoke with Mrs. Jay Woods who states that her son has been experiencing rapid speech, talking to dead people, and hearing other voices.  She states that her son lost his medicaid a few months ago and has not been getting or taking his medications since then.  She states that his behaviors began to worsen approximately 1 week ago.  She states that this is his 3rd hospitalization and that she would like him to receive paperwork on how to file for disability.  Mrs. Jay Woods  confirms that her son can return to the home after discharge and that there are no firearms or weapons in the home.  CSW completed SPE with Mrs. Jay Woods.   Jay Woods 09/29/2021, 3:25 PM

## 2021-09-29 NOTE — Group Note (Signed)
LCSW Group Therapy Note   Group Date: 09/29/2021 Start Time: 1300 End Time: 1400  Type of Therapy and Topic: Group Therapy: Control  Participation Level: Active  Description of Group: In this group patients will discuss what is out of their control, what is somewhat in their control, and what is within their control.  They will be encouraged to explore what issues they can control and what issues are out of their control within their daily lives. They will be guided to discuss their thoughts, feelings, and behaviors related to these issues. The group will process together ways to better control things that are well within our own control and how to notice and accept the things that are not within our control. This group will be process-oriented, with patients participating in exploration of their own experiences as well as giving and receiving support and challenge from other group members.  During this group 2 worksheets will be provided to each patient to follow along and fill out.   Therapeutic Goals: 1. Patient will identify what is within their control and what is not within their control. 2. Patient will identify their thoughts and feelings about having control over their own lives. 3. Patient will identify their thoughts and feelings about not having control over everything in their lives.. 4. Patient will identify ways that they can have more control over their own lives. 5. Patient will identify areas were they can allow others to help them or provide assistance.  Summary of Patient Progress:  The Pt attended group and remained there the entire time.  The Pt accepted all worksheets that were provided and followed along throughout the group session.  The Pt discussed what things they have control over and things they may not have control over.  The Pt was able to identify some steps to taking some additional control over their life, emotions, and actions.   [name],  LCSWA 09/29/2021  1:52 PM

## 2021-09-29 NOTE — Progress Notes (Signed)
   09/29/21 2200  Psych Admission Type (Psych Patients Only)  Admission Status Voluntary  Psychosocial Assessment  Patient Complaints None  Eye Contact Fair  Facial Expression Anxious;Animated  Affect Anxious  Speech Logical/coherent  Interaction Childlike  Motor Activity Restless  Appearance/Hygiene Unremarkable  Behavior Characteristics Cooperative;Appropriate to situation  Mood Anxious;Pleasant  Thought Process  Coherency Disorganized;Tangential  Content Preoccupation  Delusions None reported or observed  Perception WDL  Hallucination None reported or observed  Judgment Poor  Confusion Mild  Danger to Self  Current suicidal ideation?  (Denies)  Agreement Not to Harm Self Yes  Description of Agreement Verbal

## 2021-09-30 MED ORDER — LORAZEPAM 0.5 MG PO TABS
0.5000 mg | ORAL_TABLET | Freq: Two times a day (BID) | ORAL | Status: DC
  Administered 2021-09-30 (×2): 0.5 mg via ORAL
  Filled 2021-09-30 (×2): qty 1

## 2021-09-30 NOTE — Progress Notes (Signed)
[facility] MD Progress Note  09/30/2021 8:11 AM Kalven Argent  MRN:  [id number]  Subjective:  Jay Woods is a 21 year old male with a past psychiatric history of schizophrenia spectrum disorder, unspecified type, substance-induced mood disorder, multiple substance use disorders, and psychotic disorder who presented voluntarily from [facility] for paranoia, mania, and disorganized thoughts.   Yesterday's psychiatric team recommendation: - Continue Risperdal 1 mg po qam and 2mg  qhs for residual manic behavior - Continue Cogentin 0.5 mg twice daily - Continue Depakote 1500 mg nightly  -- Continue Ativan 1 mg po BID for restlessness and sleep and manic symptoms -- Continue Trazodone 100mg  qhs with extra 50mg  PRN for insomnia    On evaluation today: Patient reports having a good visit with his mom last night.  He remains appropriate in conversation, much less tangential and scattered in speech and thought process.   Patient is now questioning whether he should stay with mom or dad upon discharge.  Patient denies adverse effects of medications, and reports sustained improvements to sleep as well as intact appetite. Patient denies any acute concerns or complaints today, and denies somatic symptoms.  He denies SI/HI/AVH as well. We discuss the need to taper off the Ativan prior to discharge, and he voices understanding.   Principal Problem: Schizoaffective disorder, bipolar type ([employer])  Diagnosis: Principal Problem:   Schizoaffective disorder, bipolar type ([place]) Active Problems:   Marijuana abuse   Moderate benzodiazepine use disorder (HCC)   Methamphetamine use disorder, mild (HCC)   Nicotine use disorder  Total Time spent with patient: 30 minutes  Past Psychiatric History: Previous Psych Diagnoses: schizophrenia spectrum disorder, unspecified type, substance-induced mood disorder, multiple substance use disorders, and psychotic disorder Prior inpatient treatment: [facility]  02/2017, 11/2018, 05/2020. Current/prior outpatient treatment: Denies Prior rehab hx: Denies Psychotherapy hx: Denies History of suicide: Denies History of homicide: Denies Psychiatric medication history: Risperdal 2 mg every morning and 4 mg nightly, Depakote 1500 mg, Invega Sustenna 156 mg, Restoril 30 mg, Zyprexa 5 mg Psychiatric medication compliance history: Denies compliance after discharge Neuromodulation history: Denies Current Psychiatrist: Denies Current therapist: Denies   Substance Abuse Hx: Alcohol: Last drink 3 to 4 AM on 8/2 Tobacco: Uses nicotine vape Illicit drugs: Cocaine, K2, crystal meth, ecstasy Rx drug abuse: Roxicodone, Percocet, Xanax Rehab hx: Denies    Past Medical History:  Past Medical History:  Diagnosis Date   Eczema    History reviewed. No pertinent surgical history. Family History: History reviewed. No pertinent family history.  Family Psychiatric  History: None Indicated  Social History:  Social History   Substance and Sexual Activity  Alcohol Use Yes   Alcohol/week: 1.0 standard drink of alcohol   Types: 1 Cans of beer per week   Comment: twice/week     Social History   Substance and Sexual Activity  Drug Use Yes   Types: Marijuana    Social History   Socioeconomic History   Marital status: Single    Spouse name: Not on file   Number of children: Not on file   Years of education: Not on file   Highest education level: Not on file  Occupational History   Not on file  Tobacco Use   Smoking status: Former    Packs/day: 1.00    Years: 5.00    Total pack years: 5.00    Types: Cigarettes   Smokeless tobacco: Former  Scientific laboratory technician Use: Some days   Substances: CBD  Substance and Sexual Activity  Alcohol use: Yes    Alcohol/week: 1.0 standard drink of alcohol    Types: 1 Cans of beer per week    Comment: twice/week   Drug use: Yes    Types: Marijuana   Sexual activity: Not Currently  Other Topics Concern   Not on  file  Social History Narrative   Not on file   Social Determinants of Health   Financial Resource Strain: Not on file  Food Insecurity: Not on file  Transportation Needs: Not on file  Physical Activity: Not on file  Stress: Not on file  Social Connections: Not on file   Additional Social History:   Sleep: Good  Appetite:  Good  Current Medications: Current Facility-Administered Medications  Medication Dose Route Frequency Provider Last Rate Last Admin   acetaminophen (TYLENOL) tablet 650 mg  650 mg Oral Q6H PRN Evette Georges, NP       alum & mag hydroxide-simeth (MAALOX/MYLANTA) 200-200-20 MG/5ML suspension 30 mL  30 mL Oral Q4H PRN Evette Georges, NP       amLODipine (NORVASC) tablet 5 mg  5 mg Oral Daily Ntuen, Kris Hartmann, FNP   5 mg at 09/29/21 0916   benztropine (COGENTIN) tablet 0.5 mg  0.5 mg Oral BID Rosezetta Schlatter, MD   0.5 mg at 09/29/21 2100   divalproex (DEPAKOTE ER) 24 hr tablet 1,500 mg  1,500 mg Oral QHS Rosezetta Schlatter, MD   1,500 mg at 09/29/21 2118   loperamide (IMODIUM) capsule 2-4 mg  2-4 mg Oral PRN Harlow Asa, MD       risperiDONE (RISPERDAL M-TABS) disintegrating tablet 2 mg  2 mg Oral Q8H PRN Massengill, Ovid Curd, MD       And   LORazepam (ATIVAN) tablet 1 mg  1 mg Oral Q6H PRN Massengill, Ovid Curd, MD       And   ziprasidone (GEODON) injection 20 mg  20 mg Intramuscular Q6H PRN Massengill, Nathan, MD       LORazepam (ATIVAN) tablet 1 mg  1 mg Oral BID Ntuen, Tina C, FNP   1 mg at 09/29/21 2100   LORazepam (ATIVAN) tablet 1 mg  1 mg Oral Q6H PRN Harlow Asa, MD       magnesium hydroxide (MILK OF MAGNESIA) suspension 30 mL  30 mL Oral Daily PRN Evette Georges, NP       multivitamin with minerals tablet 1 tablet  1 tablet Oral Daily Nelda Marseille, Amy E, MD   1 tablet at 09/29/21 [id number]   nicotine polacrilex (NICORETTE) gum 2 mg  2 mg Oral Q4H while awake Rosezetta Schlatter, MD   2 mg at 09/30/21 0700   ondansetron (ZOFRAN-ODT) disintegrating tablet 4 mg  4 mg Oral  Q6H PRN Harlow Asa, MD       risperiDONE (RISPERDAL) tablet 1 mg  1 mg Oral Daily Nelda Marseille, Amy E, MD   1 mg at 09/29/21 0915   risperiDONE (RISPERDAL) tablet 2 mg  2 mg Oral QHS Ntuen, Tina C, FNP   2 mg at 09/29/21 2118   thiamine (VITAMIN B1) tablet 100 mg  100 mg Oral Daily Nelda Marseille, Amy E, MD   100 mg at 09/29/21 0915   traZODone (DESYREL) tablet 100 mg  100 mg Oral QHS Rosezetta Schlatter, MD   100 mg at 09/29/21 2118   And   traZODone (DESYREL) tablet 50 mg  50 mg Oral QHS PRN Rosezetta Schlatter, MD   50 mg at 09/26/21 2313    Lab Results:  Results for orders placed or performed during the hospital encounter of 09/26/21 (from the past 48 hour(s))  SARS Coronavirus 2 by RT PCR (hospital order, performed in [facility] hospital lab) *cepheid single result test* Anterior Nasal Swab     Status: None   Collection Time: 09/28/21 10:35 AM   Specimen: Anterior Nasal Swab  Result Value Ref Range   SARS Coronavirus 2 by RT PCR NEGATIVE NEGATIVE    Comment: (NOTE) SARS-CoV-2 target nucleic acids are NOT DETECTED.  The SARS-CoV-2 RNA is generally detectable in upper and lower respiratory specimens during the acute phase of infection. The lowest concentration of SARS-CoV-2 viral copies this assay can detect is 250 copies / mL. A negative result does not preclude SARS-CoV-2 infection and should not be used as the sole basis for treatment or other patient management decisions.  A negative result may occur with improper specimen collection / handling, submission of specimen other than nasopharyngeal swab, presence of viral mutation(s) within the areas targeted by this assay, and inadequate number of viral copies (<250 copies / mL). A negative result must be combined with clinical observations, patient history, and epidemiological information.  Fact Sheet for Patients:   https://www.patel.info/  Fact Sheet for Healthcare  Providers: https://hall.com/  This test is not yet approved or  cleared by the [place] FDA and has been authorized for detection and/or diagnosis of SARS-CoV-2 by FDA under an Emergency Use Authorization (EUA).  This EUA will remain in effect (meaning this test can be used) for the duration of the COVID-19 declaration under Section 564(b)(1) of the Act, 21 U.S.C. section 360bbb-3(b)(1), unless the authorization is terminated or revoked sooner.  Performed at [facility], [place] [address] [place], [place] [address]   Valproic acid level     Status: None   Collection Time: 09/29/21  6:23 AM  Result Value Ref Range   Valproic Acid Lvl 81 50.0 - 100.0 ug/mL    Comment: Performed at [facility], [place] [address] [place], [place] [address]  TSH     Status: None   Collection Time: 09/29/21  6:23 AM  Result Value Ref Range   TSH 0.724 0.350 - 4.500 uIU/mL    Comment: Performed by a 3rd Generation assay with a functional sensitivity of <=0.01 uIU/mL. Performed at [facility], [place] [address], [place], [place] [address]   Comprehensive metabolic panel     Status: Abnormal   Collection Time: 09/29/21  6:23 AM  Result Value Ref Range   Sodium 140 135 - 145 mmol/L   Potassium 3.8 3.5 - 5.1 mmol/L   Chloride 108 98 - 111 mmol/L   CO2 25 22 - 32 mmol/L   Glucose, Bld 99 70 - 99 mg/dL    Comment: Glucose reference range applies only to samples taken after fasting for at least 8 hours.   BUN 12 6 - 20 mg/dL   Creatinine, Ser 0.76 0.61 - 1.24 mg/dL   Calcium 9.3 8.9 - 10.3 mg/dL   Total Protein 7.7 6.5 - 8.1 g/dL   Albumin 4.4 3.5 - 5.0 g/dL   AST 14 (L) 15 - 41 U/L   ALT 20 0 - 44 U/L   Alkaline Phosphatase 35 (L) 38 - 126 U/L   Total Bilirubin 0.3 0.3 - 1.2 mg/dL   GFR, Estimated >60 >60 mL/min    Comment: (NOTE) Calculated using the CKD-EPI Creatinine Equation (2021)    Anion gap 7 5 - 15  Comment: Performed at [facility], [address] [place], [place] [address]  CBC with Differential/Platelet     Status: None   Collection Time: 09/29/21  6:22 PM  Result Value Ref Range   WBC 7.3 4.0 - 10.5 K/uL   RBC 4.47 4.22 - 5.81 MIL/uL   Hemoglobin 13.8 13.0 - 17.0 g/dL   HCT 20.2 54.2 - 70.6 %   MCV 93.7 80.0 - 100.0 fL   MCH 30.9 26.0 - 34.0 pg   MCHC 32.9 30.0 - 36.0 g/dL   RDW 23.7 62.8 - 31.5 %   Platelets 271 150 - 400 K/uL   nRBC 0.0 0.0 - 0.2 %   Neutrophils Relative % 60 %   Neutro Abs 4.4 1.7 - 7.7 K/uL   Lymphocytes Relative 28 %   Lymphs Abs 2.1 0.7 - 4.0 K/uL   Monocytes Relative 9 %   Monocytes Absolute 0.7 0.1 - 1.0 K/uL   Eosinophils Relative 2 %   Eosinophils Absolute 0.1 0.0 - 0.5 K/uL   Basophils Relative 1 %   Basophils Absolute 0.1 0.0 - 0.1 K/uL   Immature Granulocytes 0 %   Abs Immature Granulocytes 0.02 0.00 - 0.07 K/uL    Comment: Performed at [facility], [address], [place], [place] [address]    Blood Alcohol level:  Lab Results  Component Value Date   [facility] <10 09/24/2021   ETH <10 06/09/2020    Metabolic Disorder Labs: Lab Results  Component Value Date   HGBA1C 5.4 09/24/2021   MPG 108.28 09/24/2021   MPG 114 06/09/2020   Lab Results  Component Value Date   PROLACTIN 4.4 06/09/2020   Lab Results  Component Value Date   CHOL 136 09/24/2021   TRIG 31 09/24/2021   HDL 49 09/24/2021   CHOLHDL 2.8 09/24/2021   VLDL 6 09/24/2021   LDLCALC 81 09/24/2021   LDLCALC 81 06/09/2020    Physical Findings: AIMS: Facial and Oral Movements Muscles of Facial Expression: None, normal Lips and Perioral Area: None, normal Jaw: None, normal Tongue: None, normal,Extremity Movements Upper (arms, wrists, hands, fingers): None, normal Lower (legs, knees, ankles, toes): None, normal, Trunk Movements Neck, shoulders, hips: None, normal, Overall Severity Severity of abnormal movements (highest score  from questions above): None, normal Incapacitation due to abnormal movements: None, normal Patient's awareness of abnormal movements (rate only patient's report): No Awareness, Dental Status Current problems with teeth and/or dentures?: No Does patient usually wear dentures?: No  CIWA:  CIWA-Ar Total: 1  Musculoskeletal: Strength & Muscle Tone: within normal limits Gait & Station: normal Patient leans: N/A  Psychiatric Specialty Exam:  Presentation  General Appearance: Appropriate for Environment; Casual; Fairly Groomed  Eye Contact:Good  Speech:Clear and Coherent; Normal Rate  Speech Volume:Normal  Handedness:Ambidextrous  Mood and Affect  Mood:calmer,less anxious  Affect:pleasant  Thought Process  Thought Processes:less scattered and tangential  Descriptions of Associations:Tangential  Orientation:Full (Time, Place and Person)  Thought Content:grandiose, scattered  History of Schizophrenia/Schizoaffective disorder:Yes  Duration of Psychotic Symptoms:Greater than six months  Hallucinations:No data recorded  Ideas of Reference:None  Suicidal Thoughts:No data recorded  Homicidal Thoughts:No data recorded  Sensorium  Memory:Immediate Fair; Recent Fair; Remote Fair  Judgment:Fair  Insight:Shallow  Executive Functions  Concentration:Fair  Attention Span:Fair  Recall:Fair  Fund of Knowledge:Fair  Language:Fair  Psychomotor Activity  Psychomotor Activity:No data recorded  Assets  Assets:Desire for Improvement; Physical Health; Social Support  Sleep  Sleep: 6.25 hours  Physical Exam Vitals  and nursing note reviewed.  HENT:     Head: Normocephalic and atraumatic.     Nose: Nose normal.     Mouth/Throat:     Mouth: Mucous membranes are moist.     Pharynx: Oropharynx is clear.  Eyes:     Extraocular Movements: Extraocular movements intact.     Conjunctiva/sclera: Conjunctivae normal.     Pupils: Pupils are equal, round, and reactive to  light.  Pulmonary:     Effort: Pulmonary effort is normal.  Musculoskeletal:        General: Normal range of motion.  Skin:    General: Skin is warm.  Neurological:     General: No focal deficit present.     Mental Status: He is oriented to person, place, and time.  Psychiatric:     Comments:      Review of Systems  Constitutional: Negative.  Negative for chills and fever.  HENT:  Negative for ear pain, hearing loss and tinnitus.   Eyes: Negative.  Negative for blurred vision and double vision.  Respiratory: Negative.  Negative for cough, sputum production and wheezing.   Cardiovascular: Negative.  Negative for chest pain and palpitations.  Gastrointestinal: Negative.  Negative for abdominal pain, constipation, diarrhea, heartburn, nausea and vomiting.  Genitourinary:  Positive for frequency. Negative for dysuria and urgency.  Musculoskeletal: Negative.  Negative for myalgias and neck pain.  Skin: Negative.  Negative for itching and rash.  Neurological: Negative.  Negative for dizziness, tingling and headaches.  Endo/Heme/Allergies: Negative.  Negative for environmental allergies and polydipsia. Does not bruise/bleed easily.  Psychiatric/Behavioral:  Positive for substance abuse.    Blood pressure 125/82, pulse 99, temperature (!) 97.4 F (36.3 C), temperature source Oral, resp. rate 16, height 5\' 9"  (1.753 m), weight 61.7 kg, SpO2 99 %. Body mass index is 20.08 kg/m.   Treatment Plan Summary: Daily contact with patient to assess and evaluate symptoms and progress in treatment and Medication management  ASSESSMENT: Principal Problem:   Schizoaffective disorder, bipolar type ([place]) Active Problems:   Marijuana abuse   Moderate benzodiazepine use disorder (HCC)   Methamphetamine use disorder, mild (HCC)   Nicotine use disorder   Treatment Plan Summary: Daily contact with patient to assess and evaluate symptoms and progress in treatment   Physician Treatment Plan for  Secondary Diagnosis: Principal Problem:   Schizoaffective disorder, bipolar type ([place]) Active Problems:   Marijuana abuse   Moderate benzodiazepine use disorder (HCC)   Methamphetamine use disorder, mild (HCC)   Nicotine use disorder  Safety and Monitoring: Involuntarily admission to inpatient psychiatric unit for safety, stabilization and treatment Daily contact with patient to assess and evaluate symptoms and progress in treatment Patient's case to be discussed in multi-disciplinary team meeting Observation Level : q15 minute checks Vital signs: q12 hours Precautions: suicide, elopement, and assault   2. Psychiatric Diagnoses and Treatment # Schizoaffective disorder, bipolar type, current episode mixed - Continue Risperdal 1 mg po qam and 2mg  qhs for residual manic behavior             -- Metabolic profile and EKG monitoring obtained while on an atypical antipsychotic  BMI: 20.08 Lipid Panel: WNL HbgA1c: 5.4% QTc: 321ms - Continue Cogentin 0.5 mg twice daily - Continue Depakote 1500 mg nightly              -LFTs WNL, platelets normal              -VPA level 81, LFTs WNL --Decrease to Ativan 0.5  mg po BID for restlessness, sleep, and manic symptoms; will decrease to 0.5 mg once daily tomorrow, then discontinue. -- Continue Trazodone 100mg  qhs with extra 50mg  PRN for insomnia -- Encouraged patient to participate in unit milieu and in scheduled group therapies     #Nicotine use disorder #Cannabis use disorder #Benzodiazepine use disorder # Methamphetamine use disorder #Alcohol use disorder -Counseled on complete cessation of all substances -Ordered nicotine gum -CIWA protocol for monitoring of withdrawal with po thiamine and MVI replacement and Ativan 1mg  for scores >10 with MVI and thiamine oral replacement   3. Medical Issues Being Addressed: # Elevated blood pressure -Continue Norvasc 5 mg daily, started 8/5   # Subclinical hyperthyroidism, resolved TSH 0.290 (0.318  on admission 1 year ago, and 1.385 by the time of discharge) -Free T4 0.95 - Free T3 3.7 - Repeat TSH 0.724  #Urinary frequency - Monitoring I/O for signs of excessive thirst/urination - repeat CMP pending - UA WNL other than SG 1.004 and Na+ 139 - Consulted IM for questionable DI workup which they feel is outpatient w/u if needed   4. Discharge Planning:              -- Social work and case management to assist with discharge planning and identification of hospital follow-up needs prior to discharge             -- Estimated Date of Discharge: 8/10             -- Discharge Concerns: Need to establish a safety plan; Medication compliance and effectiveness             -- Discharge Goals: Return home with outpatient referrals for mental health follow-up including medication management/psychotherapy    I certify that inpatient services furnished can reasonably be expected to improve the patient's condition.     , MD 09/30/2021, 8:11 AM

## 2021-09-30 NOTE — Group Note (Signed)
Date:  09/30/2021 Time:  1:01 PM  Group Topic/Focus:  Goals Group:   The focus of this group is to help patients establish daily goals to achieve during treatment and discuss how the patient can incorporate goal setting into their daily lives to aide in recovery.    Participation Level:  Active  Participation Quality:  Attentive, Redirectable, and Sharing  Affect:  Appropriate  Cognitive:  Disorganized  Insight: Improving  Engagement in Group:  Engaged  Modes of Intervention:  Discussion  Additional Comments:  Patient was active and supportive of peers in group. He stated his goal was to see his mother today.  [name] 09/30/2021, 1:01 PM

## 2021-09-30 NOTE — Progress Notes (Signed)
   09/30/21 0900  Psych Admission Type (Psych Patients Only)  Admission Status Voluntary  Psychosocial Assessment  Patient Complaints None  Eye Contact Fair  Facial Expression Animated;Anxious  Affect Anxious  Speech Logical/coherent  Interaction Childlike  Motor Activity Restless  Appearance/Hygiene Unremarkable  Behavior Characteristics Cooperative;Appropriate to situation  Mood Anxious;Pleasant  Thought Process  Coherency Tangential  Content Preoccupation  Delusions None reported or observed  Perception WDL  Hallucination None reported or observed  Judgment Poor  Confusion Mild  Danger to Self  Current suicidal ideation? Denies  Agreement Not to Harm Self Yes  Description of Agreement Verbal  Danger to Others  Danger to Others None reported or observed

## 2021-10-01 ENCOUNTER — Encounter (HOSPITAL_COMMUNITY): Payer: Self-pay

## 2021-10-01 DIAGNOSIS — F250 Schizoaffective disorder, bipolar type: Principal | ICD-10-CM

## 2021-10-01 MED ORDER — LORAZEPAM 0.5 MG PO TABS
0.5000 mg | ORAL_TABLET | Freq: Two times a day (BID) | ORAL | Status: AC
  Administered 2021-10-01: 0.5 mg via ORAL
  Filled 2021-10-01: qty 1

## 2021-10-01 NOTE — Progress Notes (Signed)
Pt denies SI/HI/AVH and verbally agrees to approach staff if these become apparent or before harming themselves/others. Rates depression 0/10. Rates anxiety 0/10. Rates pain 0/10.  Pt has called Arboriculturist multiple times. Pt is very animated and interacting well with others. Scheduled medications administered to pt, per MD orders. RN provided support and encouragement to pt. Q15 min safety checks implemented and continued. Pt safe on the unit. RN will continue to monitor and intervene as needed.   10/01/21 0758  Psych Admission Type (Psych Patients Only)  Admission Status Voluntary  Psychosocial Assessment  Patient Complaints None  Eye Contact Brief  Facial Expression Animated  Affect Appropriate to circumstance  Speech Logical/coherent  Interaction Childlike;Assertive  Motor Activity Restless  Appearance/Hygiene Unremarkable  Behavior Characteristics Cooperative;Appropriate to situation;Calm  Mood Pleasant  Thought Process  Coherency Tangential  Content Delusions  Delusions None reported or observed  Perception WDL  Hallucination None reported or observed  Judgment Limited  Confusion None  Danger to Self  Current suicidal ideation? Denies  Danger to Others  Danger to Others None reported or observed

## 2021-10-01 NOTE — Progress Notes (Signed)
D) Pt received calm, visible, participating in milieu, and in no acute distress. Pt A & O x4. Pt denies SI, HI, A/ V H, depression, anxiety and pain at this time. A) Pt encouraged to drink fluids. Pt encouraged to come to staff with needs. Pt encouraged to attend and participate in groups. Pt encouraged to set reachable goals.  R) Pt remained safe on unit, in no acute distress, will continue to assess.      10/01/21 1930  Psych Admission Type (Psych Patients Only)  Admission Status Voluntary  Psychosocial Assessment  Patient Complaints None  Eye Contact Fair  Facial Expression Animated  Affect Appropriate to circumstance  Speech Logical/coherent  Interaction Childlike;Assertive  Motor Activity Other (Comment) (unremarkable)  Appearance/Hygiene Unremarkable  Behavior Characteristics Cooperative;Appropriate to situation  Mood Pleasant  Thought Process  Coherency Tangential  Content Delusions  Delusions None reported or observed  Perception WDL  Hallucination None reported or observed  Judgment Poor  Confusion None  Danger to Self  Current suicidal ideation? Denies  Agreement Not to Harm Self Yes  Description of Agreement verbal  Danger to Others  Danger to Others None reported or observed

## 2021-10-01 NOTE — Progress Notes (Signed)
[facility] MD Progress Note  10/01/2021 10:28 AM Jay Woods  MRN:  [id number]  Subjective:  Jay Woods is a 21 year old male with a past psychiatric history of schizophrenia spectrum disorder, unspecified type, substance-induced mood disorder, multiple substance use disorders, and psychotic disorder who presented voluntarily from [facility] for paranoia, mania, and disorganized thoughts.   Yesterday's psychiatric team recommendation: - Continue Risperdal 1 mg po qam and 2mg  qhs for residual manic behavior - Continue Cogentin 0.5 mg twice daily - Continue Depakote 1500 mg nightly  -- Decreased to Ativan 0.5 mg po BID for restlessness and sleep and manic symptoms -- Continue Trazodone 100mg  qhs with extra 50mg  PRN for insomnia    On evaluation today: Patient reports having a good day and being in a good mood thus far today.  He remains appropriate in conversation, much less tangential and scattered in speech and thought process; he still makes some bizarre statements, like alluding to being a superhero, but overall has shown significant improvements.  We discussed at length how cessation of drug use is recommended, and he voices understanding.  Patient denies adverse effects of medications, and reports sustained improvements to sleep as well as intact appetite.  He does state that when he had difficulty sleeping last night, he took a shower, and was able to sleep well afterward.  Patient denies any acute concerns or complaints today, and denies somatic symptoms.  He denies SI/HI/AVH.  Patient reports no increase in anxiety or restlessness as Ativan is tapered off.  He does feel anxious about returning home and figuring out his next steps for work but feels ready to take on this task.  Principal Problem: Schizoaffective disorder, bipolar type (HCC)  Diagnosis: Principal Problem:   Schizoaffective disorder, bipolar type (HCC) Active Problems:   Marijuana abuse   Moderate benzodiazepine use  disorder (HCC)   Methamphetamine use disorder, mild (HCC)   Nicotine use disorder  Total Time spent with patient: 30 minutes  Past Psychiatric History: Previous Psych Diagnoses: schizophrenia spectrum disorder, unspecified type, substance-induced mood disorder, multiple substance use disorders, and psychotic disorder Prior inpatient treatment: [facility] 02/2017, 11/2018, 05/2020. Current/prior outpatient treatment: Denies Prior rehab hx: Denies Psychotherapy hx: Denies History of suicide: Denies History of homicide: Denies Psychiatric medication history: Risperdal 2 mg every morning and 4 mg nightly, Depakote 1500 mg, Invega Sustenna 156 mg, Restoril 30 mg, Zyprexa 5 mg Psychiatric medication compliance history: Denies compliance after discharge Neuromodulation history: Denies Current Psychiatrist: Denies Current therapist: Denies   Substance Abuse Hx: Alcohol: Last drink 3 to 4 AM on 8/2 Tobacco: Uses nicotine vape Illicit drugs: Cocaine, K2, crystal meth, ecstasy Rx drug abuse: Roxicodone, Percocet, Xanax Rehab hx: Denies    Past Medical History:  Past Medical History:  Diagnosis Date   Eczema    History reviewed. No pertinent surgical history. Family History: History reviewed. No pertinent family history.  Family Psychiatric  History: None Indicated  Social History:  Social History   Substance and Sexual Activity  Alcohol Use Yes   Alcohol/week: 1.0 standard drink of alcohol   Types: 1 Cans of beer per week   Comment: twice/week     Social History   Substance and Sexual Activity  Drug Use Yes   Types: Marijuana    Social History   Socioeconomic History   Marital status: Single    Spouse name: Not on file   Number of children: Not on file   Years of education: Not on file   Highest education  level: Not on file  Occupational History   Not on file  Tobacco Use   Smoking status: Former    Packs/day: 1.00    Years: 5.00    Total pack  years: 5.00    Types: Cigarettes   Smokeless tobacco: Former  Scientific laboratory technician Use: Some days   Substances: CBD  Substance and Sexual Activity   Alcohol use: Yes    Alcohol/week: 1.0 standard drink of alcohol    Types: 1 Cans of beer per week    Comment: twice/week   Drug use: Yes    Types: Marijuana   Sexual activity: Not Currently  Other Topics Concern   Not on file  Social History Narrative   Not on file   Social Determinants of Health   Financial Resource Strain: Not on file  Food Insecurity: Not on file  Transportation Needs: Not on file  Physical Activity: Not on file  Stress: Not on file  Social Connections: Not on file   Additional Social History:   Sleep: Good  Appetite:  Good  Current Medications: Current Facility-Administered Medications  Medication Dose Route Frequency Provider Last Rate Last Admin   acetaminophen (TYLENOL) tablet 650 mg  650 mg Oral Q6H PRN Evette Georges, NP       alum & mag hydroxide-simeth (MAALOX/MYLANTA) 200-200-20 MG/5ML suspension 30 mL  30 mL Oral Q4H PRN Evette Georges, NP       amLODipine (NORVASC) tablet 5 mg  5 mg Oral Daily Ntuen, Kris Hartmann, FNP   5 mg at 10/01/21 0758   benztropine (COGENTIN) tablet 0.5 mg  0.5 mg Oral BID Rosezetta Schlatter, MD   0.5 mg at 10/01/21 0758   divalproex (DEPAKOTE ER) 24 hr tablet 1,500 mg  1,500 mg Oral QHS Rosezetta Schlatter, MD   1,500 mg at 09/30/21 2113   risperiDONE (RISPERDAL M-TABS) disintegrating tablet 2 mg  2 mg Oral Q8H PRN Massengill, Ovid Curd, MD       And   LORazepam (ATIVAN) tablet 1 mg  1 mg Oral Q6H PRN Massengill, Ovid Curd, MD       And   ziprasidone (GEODON) injection 20 mg  20 mg Intramuscular Q6H PRN Massengill, Nathan, MD       magnesium hydroxide (MILK OF MAGNESIA) suspension 30 mL  30 mL Oral Daily PRN Evette Georges, NP       multivitamin with minerals tablet 1 tablet  1 tablet Oral Daily Harlow Asa, MD   1 tablet at 10/01/21 0758   nicotine polacrilex (NICORETTE) gum 2 mg  2  mg Oral Q4H while awake Rosezetta Schlatter, MD   2 mg at 10/01/21 0950   risperiDONE (RISPERDAL) tablet 1 mg  1 mg Oral Daily Nelda Marseille, Amy E, MD   1 mg at 10/01/21 0758   risperiDONE (RISPERDAL) tablet 2 mg  2 mg Oral QHS Ntuen, Tina C, FNP   2 mg at 09/30/21 2113   thiamine (VITAMIN B1) tablet 100 mg  100 mg Oral Daily Nelda Marseille, Amy E, MD   100 mg at 10/01/21 0758   traZODone (DESYREL) tablet 100 mg  100 mg Oral QHS Rosezetta Schlatter, MD   100 mg at 09/30/21 2113   And   traZODone (DESYREL) tablet 50 mg  50 mg Oral QHS PRN Rosezetta Schlatter, MD   50 mg at 09/26/21 2313    Lab Results:  Results for orders placed or performed during the hospital encounter of 09/26/21 (from the past 48 hour(s))  CBC  with Differential/Platelet     Status: None   Collection Time: 09/29/21  6:22 PM  Result Value Ref Range   WBC 7.3 4.0 - 10.5 K/uL   RBC 4.47 4.22 - 5.81 MIL/uL   Hemoglobin 13.8 13.0 - 17.0 g/dL   HCT 41.9 39.0 - 52.0 %   MCV 93.7 80.0 - 100.0 fL   MCH 30.9 26.0 - 34.0 pg   MCHC 32.9 30.0 - 36.0 g/dL   RDW 13.2 11.5 - 15.5 %   Platelets 271 150 - 400 K/uL   nRBC 0.0 0.0 - 0.2 %   Neutrophils Relative % 60 %   Neutro Abs 4.4 1.7 - 7.7 K/uL   Lymphocytes Relative 28 %   Lymphs Abs 2.1 0.7 - 4.0 K/uL   Monocytes Relative 9 %   Monocytes Absolute 0.7 0.1 - 1.0 K/uL   Eosinophils Relative 2 %   Eosinophils Absolute 0.1 0.0 - 0.5 K/uL   Basophils Relative 1 %   Basophils Absolute 0.1 0.0 - 0.1 K/uL   Immature Granulocytes 0 %   Abs Immature Granulocytes 0.02 0.00 - 0.07 K/uL    Comment: Performed at [facility], [place] [address], [place], [place] [address]    Blood Alcohol level:  Lab Results  Component Value Date   [facility] <10 09/24/2021   ETH <10 [id number]    Metabolic Disorder Labs: Lab Results  Component Value Date   HGBA1C 5.4 09/24/2021   MPG 108.28 09/24/2021   MPG 114 06/09/2020   Lab Results  Component Value Date   PROLACTIN 4.4 06/09/2020   Lab  Results  Component Value Date   CHOL 136 09/24/2021   TRIG 31 09/24/2021   HDL 49 09/24/2021   CHOLHDL 2.8 09/24/2021   VLDL 6 09/24/2021   LDLCALC 81 09/24/2021   LDLCALC 81 06/09/2020    Physical Findings: AIMS: Facial and Oral Movements Muscles of Facial Expression: None, normal Lips and Perioral Area: None, normal Jaw: None, normal Tongue: None, normal,Extremity Movements Upper (arms, wrists, hands, fingers): None, normal Lower (legs, knees, ankles, toes): None, normal, Trunk Movements Neck, shoulders, hips: None, normal, Overall Severity Severity of abnormal movements (highest score from questions above): None, normal Incapacitation due to abnormal movements: None, normal Patient's awareness of abnormal movements (rate only patient's report): No Awareness, Dental Status Current problems with teeth and/or dentures?: No Does patient usually wear dentures?: No  CIWA:  CIWA-Ar Total: 0  Musculoskeletal: Strength & Muscle Tone: within normal limits Gait & Station: normal Patient leans: N/A  Psychiatric Specialty Exam:  Presentation  General Appearance: Appropriate for Environment; Casual; Fairly Groomed  Eye Contact:Good  Speech:Clear and Coherent; Normal Rate  Speech Volume:Normal  Handedness:Ambidextrous  Mood and Affect  Mood:calmer,less anxious  Affect:pleasant  Thought Process  Thought Processes:less scattered and tangential  Descriptions of Associations:Tangential  Orientation:Full (Time, Place and Person)  Thought Content:grandiose, scattered  History of Schizophrenia/Schizoaffective disorder:Yes  Duration of Psychotic Symptoms:Greater than six months  Hallucinations:No data recorded  Ideas of Reference:None  Suicidal Thoughts: Denies  Homicidal Thoughts: Denies  Sensorium  Memory:Immediate Fair; Recent Fair; Remote Fair  Judgment:Fair  Insight:Shallow  Executive Functions  Concentration:Fair  Attention  Span:Fair  [place]  Psychomotor Activity  Psychomotor Activity: Normal  Assets  Assets:Desire for Improvement; Physical Health; Social Support  Sleep  Sleep: 6 hours  Physical Exam Vitals and nursing note reviewed.  HENT:     Head: Normocephalic and atraumatic.  Nose: Nose normal.     Mouth/Throat:     Mouth: Mucous membranes are moist.     Pharynx: Oropharynx is clear.  Eyes:     Extraocular Movements: Extraocular movements intact.     Conjunctiva/sclera: Conjunctivae normal.     Pupils: Pupils are equal, round, and reactive to light.  Pulmonary:     Effort: Pulmonary effort is normal.  Musculoskeletal:        General: Normal range of motion.  Skin:    General: Skin is warm.  Neurological:     General: No focal deficit present.     Mental Status: He is oriented to person, place, and time.  Psychiatric:     Comments:      Review of Systems  Constitutional: Negative.  Negative for chills and fever.  HENT:  Negative for ear pain, hearing loss and tinnitus.   Eyes: Negative.  Negative for blurred vision and double vision.  Respiratory: Negative.  Negative for cough, sputum production and wheezing.   Cardiovascular: Negative.  Negative for chest pain and palpitations.  Gastrointestinal: Negative.  Negative for abdominal pain, constipation, diarrhea, heartburn, nausea and vomiting.  Genitourinary:  Negative for dysuria, frequency and urgency.  Musculoskeletal: Negative.  Negative for myalgias and neck pain.  Skin: Negative.  Negative for itching and rash.  Neurological: Negative.  Negative for dizziness, tingling and headaches.  Endo/Heme/Allergies: Negative.  Negative for environmental allergies and polydipsia. Does not bruise/bleed easily.  Psychiatric/Behavioral:  Positive for substance abuse.    Blood pressure 130/67, pulse 86, temperature (!) 97.5 F (36.4 C), temperature source Oral, resp. rate 18, height 5\' 9"   (1.753 m), weight 61.7 kg, SpO2 98 %. Body mass index is 20.08 kg/m.   Treatment Plan Summary: Daily contact with patient to assess and evaluate symptoms and progress in treatment and Medication management  ASSESSMENT: Principal Problem:   Schizoaffective disorder, bipolar type (HCC) Active Problems:   Marijuana abuse   Moderate benzodiazepine use disorder (HCC)   Methamphetamine use disorder, mild (HCC)   Nicotine use disorder   Treatment Plan Summary: Daily contact with patient to assess and evaluate symptoms and progress in treatment   Physician Treatment Plan for Secondary Diagnosis: Principal Problem:   Schizoaffective disorder, bipolar type (HCC) Active Problems:   Marijuana abuse   Moderate benzodiazepine use disorder (HCC)   Methamphetamine use disorder, mild (HCC)   Nicotine use disorder  Safety and Monitoring: Involuntarily admission to inpatient psychiatric unit for safety, stabilization and treatment Daily contact with patient to assess and evaluate symptoms and progress in treatment Patient's case to be discussed in multi-disciplinary team meeting Observation Level : q15 minute checks Vital signs: q12 hours Precautions: suicide, elopement, and assault   2. Psychiatric Diagnoses and Treatment # Schizoaffective disorder, bipolar type, current episode mixed - Continue Risperdal 1 mg po qam and 2mg  qhs for residual manic behavior             -- Metabolic profile and EKG monitoring obtained while on an atypical antipsychotic  BMI: 20.08 Lipid Panel: WNL HbgA1c: 5.4% QTc: - Continue Cogentin 0.5 mg twice daily - Continue Depakote 1500 mg nightly              -LFTs WNL, platelets normal              -VPA level 81, LFTs WNL --Decrease to Ativan 0.5 mg po once for restlessness, sleep, and manic symptoms; patient received last dose today. -- Continue Trazodone 100mg  qhs with extra  50mg  PRN for insomnia -- Encouraged patient to participate in unit milieu and  in scheduled group therapies     #Nicotine use disorder #Cannabis use disorder #Benzodiazepine use disorder # Methamphetamine use disorder #Alcohol use disorder - Again counseled on complete cessation of all substances - Continue nicotine gum -CIWA protocol for monitoring of withdrawal with po thiamine and MVI replacement and Ativan 1mg  for scores >10 with MVI and thiamine oral replacement   3. Medical Issues Being Addressed: # Elevated blood pressure -Continue Norvasc 5 mg daily, started 8/5   # Subclinical hyperthyroidism, resolved TSH 0.290 (0.318 on admission 1 year ago, and 1.385 by the time of discharge) -Free T4 0.95 - Free T3 3.7 - Repeat TSH 0.724  #Urinary frequency, resolved - Monitoring I/O for signs of excessive thirst/urination - repeat CMP pending - UA WNL other than SG 1.004 and Na+ 139 - Consulted IM for questionable DI workup which they feel is outpatient w/u if needed   4. Discharge Planning:              -- Social work and case management to assist with discharge planning and identification of hospital follow-up needs prior to discharge             -- Estimated Date of Discharge: 8/10             -- Discharge Concerns: Need to establish a safety plan; Medication compliance and effectiveness             -- Discharge Goals: Return home with outpatient referrals for mental health follow-up including medication management/psychotherapy    I certify that inpatient services furnished can reasonably be expected to improve the patient's condition.     Rosezetta Schlatter, MD 10/01/2021, 10:28 AM

## 2021-10-01 NOTE — [facility] Group Notes (Signed)
The focus of this group is to help patients review their daily goal of treatment and discuss progress on daily workbooks. Pt was attentive and appropriate during tonight's wrap up group. Pt was able share that his goal of the day was to keep hope and faith. Pt shared that it was frustrating talking to mother of child and hearing his baby cry. Pt stated being a mother is an hard job and he appreciate her. Pt shared that he will continue to be a better father. Pt shared that he will continue to get better for his family. Enjoyed going outside today.

## 2021-10-01 NOTE — Progress Notes (Signed)
Patient continued to be educated on I &O Patient removing the hat from his commode. Patient took two showers during the night. Compliant with medications. Support and encouragement provided.

## 2021-10-01 NOTE — BH IP Treatment Plan (Signed)
Interdisciplinary Treatment and Diagnostic Plan Update  10/01/2021 Time of Session: 9:50am  Jay Woods MRN: [id number]  Principal Diagnosis: Schizoaffective disorder, bipolar type (HCC)  Secondary Diagnoses: Principal Problem:   Schizoaffective disorder, bipolar type (HCC) Active Problems:   Marijuana abuse   Moderate benzodiazepine use disorder (HCC)   Methamphetamine use disorder, mild (HCC)   Nicotine use disorder   Current Medications:  Current Facility-Administered Medications  Medication Dose Route Frequency Provider Last Rate Last Admin   acetaminophen (TYLENOL) tablet 650 mg  650 mg Oral Q6H PRN Sindy Guadeloupe, NP       alum & mag hydroxide-simeth (MAALOX/MYLANTA) 200-200-20 MG/5ML suspension 30 mL  30 mL Oral Q4H PRN Sindy Guadeloupe, NP       amLODipine (NORVASC) tablet 5 mg  5 mg Oral Daily Ntuen, Jesusita Oka, FNP   5 mg at 10/01/21 0758   benztropine (COGENTIN) tablet 0.5 mg  0.5 mg Oral BID Lamar Sprinkles, MD   0.5 mg at 10/01/21 0758   divalproex (DEPAKOTE ER) 24 hr tablet 1,500 mg  1,500 mg Oral QHS Lamar Sprinkles, MD   1,500 mg at 09/30/21 2113   risperiDONE (RISPERDAL M-TABS) disintegrating tablet 2 mg  2 mg Oral Q8H PRN Massengill, Harrold Donath, MD       And   LORazepam (ATIVAN) tablet 1 mg  1 mg Oral Q6H PRN Massengill, Harrold Donath, MD       And   ziprasidone (GEODON) injection 20 mg  20 mg Intramuscular Q6H PRN Massengill, Nathan, MD       magnesium hydroxide (MILK OF MAGNESIA) suspension 30 mL  30 mL Oral Daily PRN Sindy Guadeloupe, NP       multivitamin with minerals tablet 1 tablet  1 tablet Oral Daily Comer Locket, MD   1 tablet at 10/01/21 0758   nicotine polacrilex (NICORETTE) gum 2 mg  2 mg Oral Q4H while awake Lamar Sprinkles, MD   2 mg at 10/01/21 3662   risperiDONE (RISPERDAL) tablet 1 mg  1 mg Oral Daily Mason Jim, Amy E, MD   1 mg at 10/01/21 0758   risperiDONE (RISPERDAL) tablet 2 mg  2 mg Oral QHS Ntuen, Tina C, FNP   2 mg at 09/30/21 2113   thiamine  (VITAMIN B1) tablet 100 mg  100 mg Oral Daily Mason Jim, Amy E, MD   100 mg at 10/01/21 0758   traZODone (DESYREL) tablet 100 mg  100 mg Oral QHS Lamar Sprinkles, MD   100 mg at 09/30/21 2113   And   traZODone (DESYREL) tablet 50 mg  50 mg Oral QHS PRN Lamar Sprinkles, MD   50 mg at 09/26/21 2313   PTA Medications: No medications prior to admission.    Patient Stressors:    Patient Strengths:    Treatment Modalities: Medication Management, Group therapy, Case management,  1 to 1 session with clinician, Psychoeducation, Recreational therapy.   Physician Treatment Plan for Primary Diagnosis: Schizoaffective disorder, bipolar type (HCC) Long Term Goal(s): Improvement in symptoms so as ready for discharge   Short Term Goals: Ability to identify changes in lifestyle to reduce recurrence of condition will improve Ability to verbalize feelings will improve Ability to disclose and discuss suicidal ideas Ability to demonstrate self-control will improve Ability to identify and develop effective coping behaviors will improve Ability to maintain clinical measurements within normal limits will improve Compliance with prescribed medications will improve Ability to identify triggers associated with substance abuse/mental health issues will improve  Medication Management: Evaluate patient's response, side  effects, and tolerance of medication regimen.  Therapeutic Interventions: 1 to 1 sessions, Unit Group sessions and Medication administration.  Evaluation of Outcomes: Progressing  Physician Treatment Plan for Secondary Diagnosis: Principal Problem:   Schizoaffective disorder, bipolar type (HCC) Active Problems:   Marijuana abuse   Moderate benzodiazepine use disorder (HCC)   Methamphetamine use disorder, mild (HCC)   Nicotine use disorder  Long Term Goal(s): Improvement in symptoms so as ready for discharge   Short Term Goals: Ability to identify changes in lifestyle to reduce  recurrence of condition will improve Ability to verbalize feelings will improve Ability to disclose and discuss suicidal ideas Ability to demonstrate self-control will improve Ability to identify and develop effective coping behaviors will improve Ability to maintain clinical measurements within normal limits will improve Compliance with prescribed medications will improve Ability to identify triggers associated with substance abuse/mental health issues will improve     Medication Management: Evaluate patient's response, side effects, and tolerance of medication regimen.  Therapeutic Interventions: 1 to 1 sessions, Unit Group sessions and Medication administration.  Evaluation of Outcomes: Progressing   RN Treatment Plan for Primary Diagnosis: Schizoaffective disorder, bipolar type (HCC) Long Term Goal(s): Knowledge of disease and therapeutic regimen to maintain health will improve  Short Term Goals: Ability to remain free from injury will improve, Ability to participate in decision making will improve, Ability to verbalize feelings will improve, Ability to disclose and discuss suicidal ideas, and Ability to identify and develop effective coping behaviors will improve  Medication Management: RN will administer medications as ordered by provider, will assess and evaluate patient's response and provide education to patient for prescribed medication. RN will report any adverse and/or side effects to prescribing provider.  Therapeutic Interventions: 1 on 1 counseling sessions, Psychoeducation, Medication administration, Evaluate responses to treatment, Monitor vital signs and CBGs as ordered, Perform/monitor CIWA, COWS, AIMS and Fall Risk screenings as ordered, Perform wound care treatments as ordered.  Evaluation of Outcomes: Progressing   LCSW Treatment Plan for Primary Diagnosis: Schizoaffective disorder, bipolar type (HCC) Long Term Goal(s): Safe transition to appropriate next level of  care at discharge, Engage patient in therapeutic group addressing interpersonal concerns.  Short Term Goals: Engage patient in aftercare planning with referrals and resources, Increase social support, Increase emotional regulation, Facilitate acceptance of mental health diagnosis and concerns, Identify triggers associated with mental health/substance abuse issues, and Increase skills for wellness and recovery  Therapeutic Interventions: Assess for all discharge needs, 1 to 1 time with Social worker, Explore available resources and support systems, Assess for adequacy in community support network, Educate family and significant other(s) on suicide prevention, Complete Psychosocial Assessment, Interpersonal group therapy.  Evaluation of Outcomes: Progressing   Progress in Treatment: Attending groups: Yes. Participating in groups: Yes. Taking medication as prescribed: Yes. Toleration medication: Yes. Family/Significant other contact made: Yes, will contact:  Mother Patient understands diagnosis: No. Discussing patient identified problems/goals with staff: Yes. Medical problems stabilized or resolved: Yes. Denies suicidal/homicidal ideation: Yes. Issues/concerns per patient self-inventory: No.     New problem(s) identified: No, Describe:  none reported   New Short Term/Long Term Goal(s):    medication stabilization, elimination of SI thoughts, development of comprehensive mental wellness plan.      Patient Goals:  Pt states, "I want my mood to not be all over the place"   Discharge Plan or Barriers: Patient will return home with mother and father and will follow-up with [facility] for therapy and medication management.     Reason  for Continuation of Hospitalization:  Medication stabilization   Estimated Length of Stay: 3-5 days   Last 3 [place] Suicide Severity Risk Score: Flowsheet Row [facility] Most recent  reading at 09/26/2021  1:31 AM [facility] Most recent reading at 09/25/2021 12:19 AM Admission (Discharged) from OP [facility] Most recent reading at 09/24/2021  4:12 PM  C-SSRS RISK CATEGORY No Risk No Risk No Risk       Last PHQ 2/9 Scores:     No data to display          Scribe for Treatment Team: [name], Theresia Majors 10/01/2021 9:23 AM

## 2021-10-01 NOTE — BHH Counselor (Signed)
At the Pt's mothers request, CSW provided the Pt with printed information about applying for Disability Benefits online.

## 2021-10-01 NOTE — Group Note (Signed)
LCSW Group Therapy Note   Group Date: 10/01/2021 Start Time: 1300 End Time: 1400  LCSW Group Therapy Note  Type of Therapy/Topic: Group Therapy: Six Dimensions of Wellness  Participation Level: Active  Description of Group: This group will address the concept of wellness and the six concepts of wellness: occupational, physical, social, intellectual, spiritual, and emotional. Patients will be encouraged to process areas in their lives that are out of balance and identify reasons for remaining unbalanced. Patients will be encouraged to explore ways to practice healthy habits daily to attain better physical and mental health outcomes.   Therapeutic Goals: 1. Identify aspects of wellness that they are doing well. 2. Identify aspects of wellness that they would like to improve upon. 3. Identify one action they can take to improve an aspect of wellness in their lives.   Summary of Patient Progress:  The Pt attended group and remained there the entire time.  He shared that one thing he does for wellness is spending time with family.  The Pt accepted all worksheets and followed along throughout the session.  The Pt was appropriate with their peers and was able to identify was to achieve wellness daily.     Therapeutic Modalities: Cognitive Behavioral Therapy Solution-Focused Therapy Relapse Prevention  [name], LCSWA 10/01/2021  1:45 PM

## 2021-10-01 NOTE — BHH Group Notes (Signed)
Adult Psychoeducational Group Note  Date:  10/01/2021 Time:  9:43 AM  Group Topic/Focus:  Goals Group:   The focus of this group is to help patients establish daily goals to achieve during treatment and discuss how the patient can incorporate goal setting into their daily lives to aide in recovery.  Participation Level:  Active  Participation Quality:  Intrusive  Affect:  Excited  Cognitive:  Appropriate  Insight: Appropriate  Engagement in Group:  Engaged and Improving  Modes of Intervention:  Discussion  Additional Comments:    Donell Beers 10/01/2021, 9:43 AM

## 2021-10-02 ENCOUNTER — Telehealth (HOSPITAL_COMMUNITY): Payer: Self-pay

## 2021-10-02 MED ORDER — RISPERIDONE 1 MG PO TABS: 1.0000 mg | ORAL_TABLET | Freq: Every day | ORAL | 0 refills | Status: DC

## 2021-10-02 MED ORDER — TRAZODONE HCL 100 MG PO TABS
100.0000 mg | ORAL_TABLET | Freq: Every day | ORAL | 0 refills | Status: DC
Start: 2021-10-02 — End: 2021-10-06

## 2021-10-02 MED ORDER — AMLODIPINE BESYLATE 5 MG PO TABS: 5.0000 mg | ORAL_TABLET | Freq: Every day | ORAL | 0 refills | Status: DC

## 2021-10-02 MED ORDER — DIVALPROEX SODIUM ER 500 MG PO TB24: 1500.0000 mg | ORAL_TABLET | Freq: Every day | ORAL | 0 refills | Status: DC

## 2021-10-02 MED ORDER — RISPERIDONE 2 MG PO TABS: 2.0000 mg | ORAL_TABLET | Freq: Every day | ORAL | 0 refills | Status: DC

## 2021-10-02 MED ORDER — BENZTROPINE MESYLATE 0.5 MG PO TABS: 0.5000 mg | ORAL_TABLET | Freq: Two times a day (BID) | ORAL | 0 refills | Status: DC

## 2021-10-02 MED ORDER — NICOTINE POLACRILEX 2 MG MT GUM: 2.0000 mg | CHEWING_GUM | OROMUCOSAL | 0 refills | Status: DC

## 2021-10-02 NOTE — Progress Notes (Signed)
  [facility] Adult Case Management Discharge Plan :  Will you be returning to the same living situation after discharge:  Yes,  Home  At discharge, do you have transportation home?: Yes,  Mother  Do you have the ability to pay for your medications: Yes,  Family and Employment   Release of information consent forms completed and in the chart;  Patient's signature needed at discharge.  Patient to Follow up at:  Follow-up Information     [facility]. Go to.   Specialty: Behavioral Health Why: Please go to this provider for an assessment to obtain therapy and medication management services on Monday or Wednesday, arrive by 7:30 am.  Services are provided on a first come, first served basis. Contact information: [address] [place] [address] [phone]                Next level of care provider has access to [facility] Link:yes  Safety Planning and Suicide Prevention discussed: Yes,  with patient and mother      Has patient been referred to the Quitline?: N/A patient is not a smoker  Patient has been referred for addiction treatment: Pt. refused referral  [name], LCSWA 10/02/2021, 10:08 AM

## 2021-10-02 NOTE — Progress Notes (Signed)
Discharge note: SSP complete. RN met with pt and reviewed pt's discharge instructions. Pt verbalized understanding of discharge instructions and pt did not have any questions. RN reviewed and provided pt with a copy of SRA, AVS and Transition Record. RN returned pt's belongings to pt. Samples were given to pt. Pt denied SI/HI/AVH and voiced no concerns. Pt was appreciative of the care pt received at [facility]. Patient discharged to the lobby without incident.   10/02/21 0819  Psych Admission Type (Psych Patients Only)  Admission Status Voluntary  Psychosocial Assessment  Patient Complaints None  Eye Contact Fair  Facial Expression Animated  Affect Appropriate to circumstance  Speech Logical/coherent  Interaction Assertive;Childlike  Motor Activity Other (Comment) (WDL)  Appearance/Hygiene Unremarkable  Behavior Characteristics Cooperative;Appropriate to situation;Calm  Mood Pleasant  Thought Process  Coherency Tangential  Content WDL  Delusions None reported or observed  Perception WDL  Hallucination None reported or observed  Judgment Impaired  Confusion None  Danger to Self  Current suicidal ideation? Denies  Danger to Others  Danger to Others None reported or observed

## 2021-10-02 NOTE — Discharge Summary (Addendum)
Physician Discharge Summary Note  Patient:  Jay Woods is an 21 y.o., male MRN:  [id number] DOB:  November 18, 2000 Patient phone:  [phone] (home)  Patient address:   [address] [place] [facility] [address],  Total Time spent with patient: 20 minutes  Date of Admission:  09/26/2021 Date of Discharge: 10/02/2021  Reason for Admission: Per H&P-"Nazar Artica-Limones is a 21 year old male with a past psychiatric history of schizophrenia spectrum disorder, unspecified type, substance-induced mood disorder, multiple substance use disorders, and psychotic disorder who presented voluntarily from [facility] for paranoia, mania, and disorganized thoughts.   On chart review, patient has had multiple inpatient psychiatric admissions in which he has been disorganized, volatile, and manic.  During the most recent admission, he was discharged on Depakote 1500 mg nightly, Cogentin 1 mg twice daily, Risperdal 2 mg every morning and 4 mg nightly, and trazodone 50 mg nightly."  Principal Problem: Schizoaffective disorder, bipolar type [facility]) Discharge Diagnoses: Principal Problem:   Schizoaffective disorder, bipolar type (HCC) Active Problems:   Marijuana abuse   Moderate benzodiazepine use disorder (HCC)   Methamphetamine use disorder, mild (HCC)   Nicotine use disorder  On day of discharge, patient denies SI, HI, AVH, and paranoia.  He is less tangential and pressured in his speech and less disorganized in his thoughts.  He denies somatic complaints and medication adverse effects.  He slept well, appetite is intact, and he is voiding appropriately.  He is forward thinking and able to contract for safety upon discharge.  Past Psychiatric History: See H&P  Past Medical History:  Past Medical History:  Diagnosis Date   Eczema    History reviewed. No pertinent surgical history. Family History: History reviewed. No pertinent family history. Family Psychiatric  History: See H&P Social History:  Social  History   Substance and Sexual Activity  Alcohol Use Yes   Alcohol/week: 1.0 standard drink of alcohol   Types: 1 Cans of beer per week   Comment: twice/week     Social History   Substance and Sexual Activity  Drug Use Yes   Types: Marijuana    Social History   Socioeconomic History   Marital status: Single    Spouse name: Not on file   Number of children: Not on file   Years of education: Not on file   Highest education level: Not on file  Occupational History   Not on file  Tobacco Use   Smoking status: Former    Packs/day: 1.00    Years: 5.00    Total pack years: 5.00    Types: Cigarettes   Smokeless tobacco: Former  Building services engineer Use: Some days   Substances: CBD  Substance and Sexual Activity   Alcohol use: Yes    Alcohol/week: 1.0 standard drink of alcohol    Types: 1 Cans of beer per week    Comment: twice/week   Drug use: Yes    Types: Marijuana   Sexual activity: Not Currently  Other Topics Concern   Not on file  Social History Narrative   Not on file   Social Determinants of Health   Financial Resource Strain: Not on file  Food Insecurity: Not on file  Transportation Needs: Not on file  Physical Activity: Not on file  Stress: Not on file  Social Connections: Not on file    Hospital Course:    During the patient's hospitalization, patient had extensive initial psychiatric evaluation, and follow-up psychiatric evaluations every day.  Psychiatric diagnoses provided upon initial  assessment:  Principal Problem:   Schizoaffective disorder, bipolar type (HCC) Active Problems:   Marijuana abuse   Moderate benzodiazepine use disorder (HCC)   Methamphetamine use disorder, mild (HCC)   Nicotine use disorder  Patient's psychiatric medications were adjusted on admission:  - Start Risperdal 1 mg twice daily - Start Cogentin 0.5 mg twice daily - Start Depakote 1500 mg nightly (Depakote 750 mg initiated at [facility]) -Trazodone 50 mg nightly as  needed sleep  During the hospitalization, other adjustments were made to the patient's psychiatric medication regimen:  -Risperdal increased to 1 mg every morning and 2 mg nightly -Ativan 1 mg twice daily added for restlessness; tapered off prior to discharge -Norvasc 5 mg daily added for elevated blood pressure  Patient's care was discussed during the interdisciplinary team meeting every day during the hospitalization.  The patient denied having side effects to prescribed psychiatric medication.  Gradually, patient started adjusting to milieu. The patient was evaluated each day by a clinical provider to ascertain response to treatment. Improvement was noted by the patient's report of decreasing symptoms, improved sleep and appetite, affect, medication tolerance, behavior, and participation in unit programming.  Patient was asked each day to complete a self inventory noting mood, mental status, pain, new symptoms, anxiety and concerns.   Symptoms were reported as significantly decreased or resolved completely by discharge.  The patient reports that their mood is stable.  The patient denied having suicidal thoughts for more than 48 hours prior to discharge.  Patient denies having homicidal thoughts.  Patient denies having auditory hallucinations.  Patient denies any visual hallucinations or other symptoms of psychosis.  The patient was motivated to continue taking medication with a goal of continued improvement in mental health.   The patient reports their target psychiatric symptoms of paranoia, mania, and disorganized thoughts responded well to the psychiatric medications, and the patient reports overall benefit other psychiatric hospitalization. Supportive psychotherapy was provided to the patient. The patient also participated in regular group therapy while hospitalized. Coping skills, problem solving as well as relaxation therapies were also part of the unit programming.  Labs were reviewed  with the patient, and abnormal results were discussed with the patient.  The patient is able to verbalize their individual safety plan to this provider.  # It is recommended to the patient to continue psychiatric medications as prescribed, after discharge from the hospital.    # It is recommended to the patient to follow up with your outpatient psychiatric provider and PCP.  # It was discussed with the patient, the impact of alcohol, drugs, tobacco have been there overall psychiatric and medical wellbeing, and total abstinence from substance use was recommended the patient.ed.  # Prescriptions provided or sent directly to preferred pharmacy at discharge. Patient agreeable to plan. Given opportunity to ask questions. Appears to feel comfortable with discharge.    # In the event of worsening symptoms, the patient is instructed to call the crisis hotline, 911 and or go to the nearest ED for appropriate evaluation and treatment of symptoms. To follow-up with primary care provider for other medical issues, concerns and or health care needs  # Patient was discharged home with a plan to follow up as noted below.  Physical Findings: AIMS: Facial and Oral Movements Muscles of Facial Expression: None, normal Lips and Perioral Area: None, normal Jaw: None, normal Tongue: None, normal,Extremity Movements Upper (arms, wrists, hands, fingers): None, normal Lower (legs, knees, ankles, toes): None, normal, Trunk Movements Neck, shoulders, hips: None,  normal, Overall Severity Severity of abnormal movements (highest score from questions above): None, normal Incapacitation due to abnormal movements: None, normal Patient's awareness of abnormal movements (rate only patient's report): No Awareness, Dental Status Current problems with teeth and/or dentures?: No Does patient usually wear dentures?: No  CIWA:  CIWA-Ar Total: 0   Musculoskeletal: Strength & Muscle Tone: within normal limits Gait & Station:  normal Patient leans: N/A   Psychiatric Specialty Exam:  Presentation  General Appearance: Appropriate for Environment; Casual; Well Groomed   Eye Contact:Good   Speech:Clear and Coherent; Normal Rate   Speech Volume:Normal   Handedness:Ambidextrous    Mood and Affect  Mood:Anxious; Euthymic   Affect:Appropriate; Congruent    Thought Process  Thought Processes:Coherent; Goal Directed   Descriptions of Associations:Intact   Orientation:Full (Time, Place and Person)   Thought Content:Logical; WDL   History of Schizophrenia/Schizoaffective disorder:Yes   Duration of Psychotic Symptoms:Greater than six months  Hallucinations:Hallucinations: None   Ideas of Reference:None   Suicidal Thoughts:Suicidal Thoughts: No   Homicidal Thoughts:Homicidal Thoughts: No    Sensorium  Memory:Immediate Fair; Recent Fair   Judgment:Fair   Insight:Fair; Shallow    Executive Functions  Concentration:Fair   Attention Span:Good   Recall:Fair   Fund of Knowledge:Fair   Language:Fair    Psychomotor Activity  Psychomotor Activity:Psychomotor Activity: Normal   Assets  Assets:Desire for Improvement; Physical Health; Housing; Social Support    Sleep  Sleep:Sleep: Good    Physical Exam: Vitals and nursing note reviewed.  HENT:     Head: Normocephalic and atraumatic.     Nose: Nose normal.     Mouth/Throat:     Mouth: Mucous membranes are moist.     Pharynx: Oropharynx is clear.  Eyes:     Extraocular Movements: Extraocular movements intact.     Conjunctiva/sclera: Conjunctivae normal.     Pupils: Pupils are equal, round, and reactive to light.  Pulmonary:     Effort: Pulmonary effort is normal.  Musculoskeletal:        General: Normal range of motion.  Skin:    General: Skin is warm.  Neurological:     General: No focal deficit present.     Mental Status: He is oriented to person, place, and time.     Review of Systems   Constitutional: Negative.  Negative for chills and fever.  HENT:  Negative for ear pain, hearing loss and tinnitus.   Eyes: Negative.  Negative for blurred vision and double vision.  Respiratory: Negative.  Negative for cough, sputum production and wheezing.   Cardiovascular: Negative.  Negative for chest pain and palpitations.  Gastrointestinal: Negative.  Negative for abdominal pain, constipation, diarrhea, heartburn, nausea and vomiting.  Genitourinary:  Negative for dysuria, frequency and urgency.  Musculoskeletal: Negative.  Negative for myalgias and neck pain.  Skin: Negative.  Negative for itching and rash.  Neurological: Negative.  Negative for dizziness, tingling and headaches.  Endo/Heme/Allergies: Negative.  Negative for environmental allergies and polydipsia. Does not bruise/bleed easily.  Psychiatric/Behavioral:  Positive for substance abuse.    Blood pressure (!) 147/86, pulse 78, temperature 97.8 F (36.6 C), temperature source Oral, resp. rate (!) 25, height 5\' 9"  (1.753 m), weight 61.7 kg, SpO2 100 %. Body mass index is 20.08 kg/m.   Social History   Tobacco Use  Smoking Status Former   Packs/day: 1.00   Years: 5.00   Total pack years: 5.00   Types: Cigarettes  Smokeless Tobacco Former   Tobacco Cessation:  A prescription  for an FDA-approved tobacco cessation medication provided at discharge   Blood Alcohol level:  Lab Results  Component [facility]   [facility]   ETH <10 06/09/2020    Metabolic Disorder Labs:  Lab Results  Component Value Date   HGBA1C 5.4 09/24/2021   MPG 108.28 09/24/2021   MPG 114 06/09/2020   Lab Results  Component Value Date   PROLACTIN 4.4 06/09/2020   Lab Results  Component Value Date   CHOL 136 09/24/2021   TRIG 31 09/24/2021   HDL 49 09/24/2021   CHOLHDL 2.8 09/24/2021   VLDL 6 09/24/2021   LDLCALC 81 09/24/2021   LDLCALC 81 06/09/2020    See Psychiatric Specialty Exam and Suicide Risk Assessment  completed by Attending Physician prior to discharge.  Discharge destination:  Home  Is patient on multiple antipsychotic therapies at discharge:  No   Has Patient had three or more failed trials of antipsychotic monotherapy by history:  No  Recommended Plan for Multiple Antipsychotic Therapies: NA  Discharge Instructions     Diet - low sodium heart healthy   Complete by: As directed    Increase activity slowly   Complete by: As directed       Allergies as of 10/02/2021   No Known Allergies      Medication List     TAKE these medications      Indication  amLODipine 5 MG tablet Commonly known as: NORVASC Take 1 tablet (5 mg total) by mouth daily. Start taking on: October 03, 2021  Indication: High Blood Pressure Disorder   benztropine 0.5 MG tablet Commonly known as: COGENTIN Take 1 tablet (0.5 mg total) by mouth 2 (two) times daily.  Indication: Extrapyramidal Reaction caused by Medications   divalproex 500 MG 24 hr tablet Commonly known as: DEPAKOTE ER Take 3 tablets (1,500 mg total) by mouth at bedtime.  Indication: MIXED BIPOLAR AFFECTIVE DISORDER   nicotine polacrilex 2 MG gum Commonly known as: NICORETTE Take 1 each (2 mg total) by mouth every 4 (four) hours while awake.  Indication: Nicotine Addiction   risperiDONE 2 MG tablet Commonly known as: RISPERDAL Take 1 tablet (2 mg total) by mouth at bedtime.  Indication: MIXED BIPOLAR AFFECTIVE DISORDER, Schizophrenia   risperiDONE 1 MG tablet Commonly known as: RISPERDAL Take 1 tablet (1 mg total) by mouth daily. Start taking on: October 03, 2021  Indication: MIXED BIPOLAR AFFECTIVE DISORDER, Schizophrenia   traZODone 100 MG tablet Commonly known as: DESYREL Take 1 tablet (100 mg total) by mouth at bedtime.  Indication: Trouble Sleeping         Follow-up Information     [facility]. Go to.   Specialty: Behavioral Health Why: Please go to this provider for an  assessment to obtain therapy and medication management services on Monday or Wednesday, arrive by 7:30 am.  Services are provided on a first come, first served basis. Contact information: [address] [place] [id number] [phone]                Follow-up recommendations:   Activity: as tolerated  Diet: heart healthy  Other: -Follow-up with your outpatient psychiatric provider -instructions on appointment date, time, and address (location) are provided to you in discharge paperwork.  -Take your psychiatric medications as prescribed at discharge - instructions are provided to you in the discharge paperwork  -Follow-up with outpatient primary care doctor and other specialists -for management of chronic medical disease, including: Elevated blood  pressure.  As well as transient subclinical hyperthyroidism, TSH 0.29 on admission, but increased to 0.73  -Testing: Follow-up with outpatient provider for abnormal lab results: Routine monitoring of TSH  -Recommend abstinence from alcohol, tobacco, and other illicit drug use at discharge.   -If your psychiatric symptoms recur, worsen, or if you have side effects to your psychiatric medications, call your outpatient psychiatric provider, 911, [phone] or go to the nearest emergency department.  -If suicidal thoughts recur, call your outpatient psychiatric provider, 911, [phone] or go to the nearest emergency department.  Signed: Lamar Sprinkles, MD 10/02/2021, 11:20 AM

## 2021-10-02 NOTE — [facility] Suicide Risk Assessment (Signed)
Suicide Risk Assessment  Discharge Assessment    [facility] Discharge Suicide Risk Assessment   Principal Problem: Schizoaffective disorder, bipolar type [facility]) Discharge Diagnoses: Principal Problem:   Schizoaffective disorder, bipolar type (HCC) Active Problems:   Marijuana abuse   Moderate benzodiazepine use disorder (HCC)   Methamphetamine use disorder, mild (HCC)   Nicotine use disorder  During the patient's hospitalization, patient had extensive initial psychiatric evaluation, and follow-up psychiatric evaluations every day.  Psychiatric diagnoses provided upon initial assessment:  Principal Problem:   Schizoaffective disorder, bipolar type (HCC) Active Problems:   Marijuana abuse   Moderate benzodiazepine use disorder (HCC)   Methamphetamine use disorder, mild (HCC)   Nicotine use disorder  Patient's psychiatric medications were adjusted on admission:  - Start Risperdal 1 mg twice daily - Start Cogentin 0.5 mg twice daily - Start Depakote 1500 mg nightly (Depakote 750 mg initiated at [facility]) -Trazodone 50 mg nightly as needed sleep  During the hospitalization, other adjustments were made to the patient's psychiatric medication regimen:  -Risperdal increased to 1 mg every morning and 2 mg nightly -Ativan 1 mg twice daily added for restlessness; tapered off prior to discharge -Norvasc 5 mg daily added for elevated blood pressure  Patient's care was discussed during the interdisciplinary team meeting every day during the hospitalization.  The patient denied having side effects to prescribed psychiatric medication.  Total Time spent with patient: 20 minutes  Musculoskeletal: Strength & Muscle Tone: within normal limits Gait & Station: normal Patient leans: N/A  Psychiatric Specialty Exam  Presentation  General Appearance: Appropriate for Environment; Casual; Well Groomed  Eye Contact:Good  Speech:Clear and Coherent; Normal Rate  Speech  Volume:Normal  Handedness:Ambidextrous   Mood and Affect  Mood:Anxious; Euthymic  Duration of Depression Symptoms: No data recorded Affect:Appropriate; Congruent   Thought Process  Thought Processes:Coherent; Goal Directed  Descriptions of Associations:Intact  Orientation:Full (Time, Place and Person)  Thought Content:Logical; WDL  History of Schizophrenia/Schizoaffective disorder:Yes  Duration of Psychotic Symptoms:Greater than six months  Hallucinations:Hallucinations: None  Ideas of Reference:None  Suicidal Thoughts:Suicidal Thoughts: No  Homicidal Thoughts:Homicidal Thoughts: No   Sensorium  Memory:Immediate Fair; Recent Fair  Judgment:Fair  Insight:Fair; Shallow   Executive Functions  Concentration:Fair  Attention Span:Good  Recall:Fair  Fund of Knowledge:Fair  Language:Fair   Psychomotor Activity  Psychomotor Activity:Psychomotor Activity: Normal  Assets  Assets:Desire for Improvement; Physical Health; Housing; Social Support   Sleep  Sleep:Sleep: Good  Physical Exam: Vitals and nursing note reviewed.  HENT:     Head: Normocephalic and atraumatic.     Nose: Nose normal.     Mouth/Throat:     Mouth: Mucous membranes are moist.     Pharynx: Oropharynx is clear.  Eyes:     Extraocular Movements: Extraocular movements intact.     Conjunctiva/sclera: Conjunctivae normal.     Pupils: Pupils are equal, round, and reactive to light.  Pulmonary:     Effort: Pulmonary effort is normal.  Musculoskeletal:        General: Normal range of motion.  Skin:    General: Skin is warm.  Neurological:     General: No focal deficit present.     Mental Status: He is oriented to person, place, and time.      Review of Systems  Constitutional: Negative.  Negative for chills and fever.  HENT:  Negative for ear pain, hearing loss and tinnitus.   Eyes: Negative.  Negative for blurred vision and double vision.  Respiratory: Negative.  Negative for  cough,  sputum production and wheezing.   Cardiovascular: Negative.  Negative for chest pain and palpitations.  Gastrointestinal: Negative.  Negative for abdominal pain, constipation, diarrhea, heartburn, nausea and vomiting.  Genitourinary:  Negative for dysuria, frequency and urgency.  Musculoskeletal: Negative.  Negative for myalgias and neck pain.  Skin: Negative.  Negative for itching and rash.  Neurological: Negative.  Negative for dizziness, tingling and headaches.  Endo/Heme/Allergies: Negative.  Negative for environmental allergies and polydipsia. Does not bruise/bleed easily.  Psychiatric/Behavioral:  Positive for substance abuse.   Blood pressure (!) 147/86, pulse 78, temperature 97.8 F (36.6 C), temperature source Oral, resp. rate (!) 25, height 5\' 9"  (1.753 m), weight 61.7 kg, SpO2 100 %. Body mass index is 20.08 kg/m.  Mental Status Per Nursing Assessment::   On Admission:  NA  Demographic Factors:  Male, Adolescent or young adult, Low socioeconomic status, and Unemployed  Loss Factors: Financial problems/change in socioeconomic status  Historical Factors: NA  Risk Reduction Factors:   Responsible for children under 72 years of age, Sense of responsibility to family, Religious beliefs about death, and Living with another person, especially a relative  Continued Clinical Symptoms:  Bipolar Disorder:   Mixed State Alcohol/Substance Abuse/Dependencies Schizophrenia:   Less than 54 years old More than one psychiatric diagnosis Unstable or Poor Therapeutic Relationship Previous Psychiatric Diagnoses and Treatments  Cognitive Features That Contribute To Risk:  None    Suicide Risk:  Mild:  There are no identifiable plans, no associated intent, mild dysphoria and related symptoms, good self-control (both objective and subjective assessment), few other risk factors, and identifiable protective factors, including available and accessible social support.   Follow-up  Information     [facility] Go to.   Specialty: Behavioral Health Why: Please go to this provider for an assessment to obtain therapy and medication management services on Monday or Wednesday, arrive by 7:30 am.  Services are provided on a first come, first served basis. Contact information: [address] [place] [phone]                Plan Of Care/Follow-up recommendations:  Activity: as tolerated   Diet: heart healthy   Other: -Follow-up with your outpatient psychiatric provider -instructions on appointment date, time, and address (location) are provided to you in discharge paperwork.   -Take your psychiatric medications as prescribed at discharge - instructions are provided to you in the discharge paperwork   -Follow-up with outpatient primary care doctor and other specialists -for management of chronic medical disease, including: Elevated blood pressure.  As well as transient subclinical hyperthyroidism, TSH 0.29 on admission, but increased to 0.73   -Testing: Follow-up with outpatient provider for abnormal lab results: Routine monitoring of TSH   -Recommend abstinence from alcohol, tobacco, and other illicit drug use at discharge.    -If your psychiatric symptoms recur, worsen, or if you have side effects to your psychiatric medications, call your outpatient psychiatric provider, 911, [phone] or go to the nearest emergency department.   -If suicidal thoughts recur, call your outpatient psychiatric provider, 911, [phone] or go to the nearest emergency department.  [phone], MD 10/02/2021, 11:30 AM

## 2021-10-06 ENCOUNTER — Ambulatory Visit (INDEPENDENT_AMBULATORY_CARE_PROVIDER_SITE_OTHER): Payer: No Payment, Other | Admitting: Psychiatry

## 2021-10-06 ENCOUNTER — Encounter (HOSPITAL_COMMUNITY): Payer: Self-pay | Admitting: Licensed Clinical Social Worker

## 2021-10-06 ENCOUNTER — Ambulatory Visit (INDEPENDENT_AMBULATORY_CARE_PROVIDER_SITE_OTHER): Payer: No Payment, Other | Admitting: Licensed Clinical Social Worker

## 2021-10-06 ENCOUNTER — Encounter (HOSPITAL_COMMUNITY): Payer: Self-pay | Admitting: Psychiatry

## 2021-10-06 DIAGNOSIS — F17200 Nicotine dependence, unspecified, uncomplicated: Secondary | ICD-10-CM | POA: Diagnosis not present

## 2021-10-06 DIAGNOSIS — F1994 Other psychoactive substance use, unspecified with psychoactive substance-induced mood disorder: Secondary | ICD-10-CM | POA: Diagnosis not present

## 2021-10-06 DIAGNOSIS — F250 Schizoaffective disorder, bipolar type: Secondary | ICD-10-CM | POA: Diagnosis not present

## 2021-10-06 MED ORDER — RISPERIDONE 1 MG PO TABS: 1.0000 mg | ORAL_TABLET | Freq: Every day | ORAL | 3 refills | Status: DC

## 2021-10-06 MED ORDER — TRAZODONE HCL 100 MG PO TABS: 100.0000 mg | ORAL_TABLET | Freq: Every day | ORAL | 3 refills | Status: DC

## 2021-10-06 MED ORDER — DIVALPROEX SODIUM ER 500 MG PO TB24: 1500.0000 mg | ORAL_TABLET | Freq: Every day | ORAL | 3 refills | Status: DC

## 2021-10-06 MED ORDER — RISPERIDONE 2 MG PO TABS: 2.0000 mg | ORAL_TABLET | Freq: Every day | ORAL | 3 refills | Status: DC

## 2021-10-06 MED ORDER — BENZTROPINE MESYLATE 0.5 MG PO TABS: 0.5000 mg | ORAL_TABLET | Freq: Two times a day (BID) | ORAL | 3 refills | Status: DC

## 2021-10-06 MED ORDER — NICOTINE POLACRILEX 2 MG MT GUM: 2.0000 mg | CHEWING_GUM | OROMUCOSAL | 3 refills | Status: DC

## 2021-10-06 NOTE — Progress Notes (Signed)
Psychiatric Initial Adult Assessment  Virtual Visit via Video Note  I connected with Jay Woods on 10/06/21 at 10:00 AM EDT by a video enabled telemedicine application and verified that I am speaking with the correct person using two identifiers.  Location: Patient: Home Provider: Clinic   I discussed the limitations of evaluation and management by telemedicine and the availability of in person appointments. The patient expressed understanding and agreed to proceed.  I provided 45 minutes of non-face-to-face time during this encounter.   Patient Identification: Jay Woods MRN:  [id number] Date of Evaluation:  10/06/2021 Referral Source: [facility] Chief Complaint:  "I feel healthy and like myself" Visit Diagnosis:    ICD-10-CM   1. Substance induced mood disorder (HCC)  F19.94     2. Schizoaffective disorder, bipolar type (HCC)  F25.0 benztropine (COGENTIN) 0.5 MG tablet    divalproex (DEPAKOTE ER) 500 MG 24 hr tablet    risperiDONE (RISPERDAL) 1 MG tablet    risperiDONE (RISPERDAL) 2 MG tablet    traZODone (DESYREL) 100 MG tablet    3. Tobacco dependence  F17.200 nicotine polacrilex (NICORETTE) 2 MG gum      History of Present Illness: 21 year old male seen today for initial psychiatric evaluation.  He was referred to outpatient psychiatry by [facility] where he was seen on 09/26/2021 through 10/02/2021.  Patient presented at [facility] with paranoia, mania, and disorganized thoughts.  He was started on Depakote 1500 mg nightly, Risperdal 1 mg daily, Risperdal 2 mg nightly, trazodone 100 mg nightly as needed, Nicorette gum 2 mg, and Cogentin 0.5 mg twice daily.  He reports his medications are effective in managing his psychiatric conditions.  Today he is well-groomed, pleasant, cooperative, engaged in conversation, maintained eye contact.  He informed Clinical research associate that since his hospitalization he feels healthy and like himself.  He notes that his mood is stable and denies current symptoms  of anxiety and depression.  Provider conducted a GAD-7 and a PHQ-9 and patient scored a 0 on both.  He endorses adequate sleep and appetite.  Today he denies SI/HI/AVH, mania, paranoia.  He informed Clinical research associate that since his hospitalization he has been sober from all illegal substances.  He notes that he does smoke 6 to 7 cigarettes a day but is trying to cut back.  He informed Clinical research associate that he finds the [employer] program beneficial.  He notes that he plans to maintain his sobriety for himself as well as his children.  Patient notes that he is doing well.  No medication changes made today.  Patient agreeable to continue medication as prescribed.  No other concerns at this time.  Associated Signs/Symptoms: Depression Symptoms:   Denies (Hypo) Manic Symptoms:  Elevated Mood, Anxiety Symptoms:   Denies Psychotic Symptoms:   Denies PTSD Symptoms: NA  Past Psychiatric History: Schizoaffective disorder, poly substance use (nicotine, marijuana, methamphetamine, benzodiazepine), substance induced mood disorder  Previous Psychotropic Medications:  Depakote, cogentin, Risperdal, trazodone  Substance Abuse History in the last 12 months:  Yes.    Consequences of Substance Abuse: Medical Consequences:  Hospitalization   Past Medical History:  Past Medical History:  Diagnosis Date   Eczema    History reviewed. No pertinent surgical history.  Family Psychiatric History: Denies  Family History: History reviewed. No pertinent family history.  Social History:   Social History   Socioeconomic History   Marital status: Single    Spouse name: Not on file   Number of children: Not on file   Years of education: Not on  file   Highest education level: Not on file  Occupational History   Not on file  Tobacco Use   Smoking status: Former    Packs/day: 0.25    Years: 5.00    Total pack years: 1.25    Types: Cigarettes   Smokeless tobacco: Former  Building services engineer Use: Some days   Substances: CBD   Substance and Sexual Activity   Alcohol use: Not Currently    Alcohol/week: 1.0 standard drink of alcohol    Types: 1 Cans of beer per week    Comment: reamined sober since hospital discharge Last week 09/30/21   Drug use: Not Currently    Types: Marijuana    Comment: Hx of cocaine, mariajua, k2, Xnax, and Molly on and off for the past 5 years   Sexual activity: Yes    Partners: Female  Other Topics Concern   Not on file  Social History Narrative   Not on file   Social Determinants of Health   Financial Resource Strain: High Risk (10/06/2021)   Overall Financial Resource Strain (CARDIA)    Difficulty of Paying Living Expenses: Very hard  Food Insecurity: Food Insecurity Present (10/06/2021)   Hunger Vital Sign    Worried About Running Out of Food in the Last Year: Not on file    Ran Out of Food in the Last Year: Often true  Transportation Needs: Unmet Transportation Needs (10/06/2021)   PRAPARE - Transportation    Lack of Transportation (Medical): Yes    Lack of Transportation (Non-Medical): Yes  Physical Activity: Sufficiently Active (10/06/2021)   Exercise Vital Sign    Days of Exercise per Week: 7 days    Minutes of Exercise per Session: 30 min  Stress: No Stress Concern Present (10/06/2021)   [employer] of Occupational Health - Occupational Stress Questionnaire    Feeling of Stress : Only a little  Social Connections: Socially Integrated (10/06/2021)   Social Connection and Isolation Panel [NHANES]    Frequency of Communication with Friends and Family: More than three times a week    Frequency of Social Gatherings with Friends and Family: More than three times a week    Attends Religious Services: More than 4 times per year    Active Member of [employer] or Organizations: No    Attends Engineer, structural: More than 4 times per year    Marital Status: Living with partner    Additional Social History: Patient resides in [place] with his mother. He is in a  relationship. He has three children. He is currently unemployed. He notes that he smoke 6-7 cigarettes daily. He denies alcohol or illegal drug use since his hospitalization.   Allergies:  No Known Allergies  Metabolic Disorder Labs: Lab Results  Component Value Date   HGBA1C 5.4 09/24/2021   MPG 108.28 09/24/2021   MPG 114 06/09/2020   Lab Results  Component Value Date   PROLACTIN 4.4 06/09/2020   Lab Results  Component Value Date   CHOL 136 09/24/2021   TRIG 31 09/24/2021   HDL 49 09/24/2021   CHOLHDL 2.8 09/24/2021   VLDL 6 09/24/2021   LDLCALC 81 09/24/2021   LDLCALC 81 06/09/2020   Lab Results  Component Value Date   TSH 0.724 09/29/2021    Therapeutic Level Labs: No results found for: "LITHIUM" No results found for: "CBMZ" Lab Results  Component Value Date   VALPROATE 81 09/29/2021    Current Medications: Current Outpatient Medications  Medication  Sig Dispense Refill   amLODipine (NORVASC) 5 MG tablet Take 1 tablet (5 mg total) by mouth daily. 30 tablet 0   benztropine (COGENTIN) 0.5 MG tablet Take 1 tablet (0.5 mg total) by mouth 2 (two) times daily. 60 tablet 3   divalproex (DEPAKOTE ER) 500 MG 24 hr tablet Take 3 tablets (1,500 mg total) by mouth at bedtime. 90 tablet 3   nicotine polacrilex (NICORETTE) 2 MG gum Take 1 each (2 mg total) by mouth every 4 (four) hours while awake. 100 tablet 3   risperiDONE (RISPERDAL) 1 MG tablet Take 1 tablet (1 mg total) by mouth daily. 30 tablet 3   risperiDONE (RISPERDAL) 2 MG tablet Take 1 tablet (2 mg total) by mouth at bedtime. 30 tablet 3   traZODone (DESYREL) 100 MG tablet Take 1 tablet (100 mg total) by mouth at bedtime. 30 tablet 3   No current facility-administered medications for this visit.    Musculoskeletal: Strength & Muscle Tone: within normal limits and telehealth visit Gait & Station: normal, telehealth visit Patient leans: N/A  Psychiatric Specialty Exam: Review of Systems  There were no vitals  taken for this visit.There is no height or weight on file to calculate BMI.  General Appearance: Well Groomed  Eye Contact:  Good  Speech:  Clear and Coherent and Normal Rate  Volume:  Normal  Mood:  Euthymic  Affect:  Appropriate and Congruent  Thought Process:  Coherent, Goal Directed, and Linear  Orientation:  Full (Time, Place, and Person)  Thought Content:  WDL and Logical  Suicidal Thoughts:  No  Homicidal Thoughts:  No  Memory:  Immediate;   Good Recent;   Good Remote;   Good  Judgement:  Good  Insight:  Good  Psychomotor Activity:  Normal  Concentration:  Concentration: Good and Attention Span: Good  Recall:  Good  Fund of Knowledge:Good  Language: Good  Akathisia:  No  Handed:  Right  AIMS (if indicated):  not done  Assets:  Communication Skills Desire for Improvement Housing Intimacy Leisure Time Physical Health Social Support  ADL's:  Intact  Cognition: WNL  Sleep:  Good   Screenings: AIMS    Flowsheet Row [facility]) [facility] Ad[facility] (Dis[facility] Ad[facility] (Dis[facility]) [facility]  AIMS Total Score 0 0 0 0      AUDIT    Flowsheet Row [facility]) [facility] Ad[facility] (Dis[facility] Ad[facility] (Dis[facility] Ad[facility] (Dis[facility]  Alcohol Use Disorder Identification Test Final Score (AUDIT) 7 3 6  0      GAD-7    Flowsheet Row Office Visit from 10/06/2021 in [facility] Most recent reading at 10/06/2021 10:13 AM  Counselor from 10/06/2021 in [facility] Most recent reading at 10/06/2021  8:37 AM  Total GAD-7 Score 0 12      PHQ2-9    Flowsheet Row Office Visit from 10/06/2021 in [facility] Most recent reading at 10/06/2021 10:12 AM Counselor from 10/06/2021 in [facility] Most recent reading at 10/06/2021  8:34 AM  PHQ-2 Total Score 0 4  PHQ-9 Total Score 0 12      Flowsheet Row Office Visit from 10/06/2021 in [facility] Most recent reading at 10/06/2021 10:12 AM Counselor from 10/06/2021 in [facility] Most recent reading at 10/06/2021  8:37 AM [facility] Most recent reading at 09/26/2021  1:31 AM  C-SSRS RISK CATEGORY No Risk No Risk No Risk       Assessment and Plan: Patient reports that he is doing well on his medications.  Since medicated he reports that his mood is stable and denies symptoms of anxiety and depression.  No medication changes made today.  Patient agreeable to continue medication as prescribed.  1. Substance induced mood disorder (HCC)   2. Schizoaffective disorder, bipolar type (HCC)  Continue- benztropine (COGENTIN) 0.5 MG tablet; Take 1 tablet (0.5 mg total) by mouth 2 (two) times daily.  Dispense: 60 tablet; Refill: 3 Continue- divalproex (DEPAKOTE ER) 500 MG 24 hr tablet; Take 3 tablets (1,500 mg total) by mouth at bedtime.  Dispense: 90 tablet; Refill: 3 Continue- risperiDONE (RISPERDAL) 1 MG tablet; Take 1 tablet (1 mg total) by mouth daily.  Dispense: 30 tablet; Refill: 3 Continue- risperiDONE (RISPERDAL) 2 MG tablet; Take 1 tablet (2 mg total) by mouth at bedtime.  Dispense: 30 tablet; Refill: 3 Continue- traZODone (DESYREL) 100 MG tablet; Take 1 tablet (100 mg total) by mouth at bedtime.  Dispense: 30 tablet; Refill: 3  3. Tobacco  dependence  Continue- nicotine polacrilex (NICORETTE) 2 MG gum; Take 1 each (2 mg total) by mouth every 4 (four) hours while awake.  Dispense: 100 tablet; Refill: 3    Collaboration of Care: Other provider involved in patient's care [facility]  Patient/Guardian was advised Release of Information must be obtained prior to any record release in order to collaborate their care with an outside provider. Patient/Guardian was advised if they have not already done so to contact the registration department to sign all necessary forms in order for [place] to release information regarding their care.   Consent: Patient/Guardian gives verbal consent for treatment and assignment of benefits for services provided during this visit. Patient/Guardian expressed understanding and agreed to proceed.   Follow-up in 3 months Follow-up with [facility] Shanna Cisco, NP 8/14/202310:27 AM

## 2021-10-06 NOTE — Progress Notes (Signed)
Comprehensive Clinical Assessment (CCA) Note  10/06/2021 Jay Woods [id number]  Chief Complaint:  Chief Complaint  Patient presents with   Addiction Problem    Taking cocaine, molly, THC, "Lean", k2 black magic, and xanax.    Depression   Anxiety   Visit Diagnosis: Substance-induced mood disorder   Client is a 21 year old male. Client is referred by Jay Woods  for a depression and substance induced mood disorder.  Client states mental health symptoms as evidenced by:   Depression -- -- Difficulty Concentrating; Sleep (too much or little); Tearfulness; Change in energy/activity Difficulty Concentrating; Sleep (too much or little); Tearfulness; Change in energy/activity  Duration of Depressive Symptoms -- -- -- Less than two weeksDuration of Depressive Symptoms. Less than two weeks. Data is from another encounter. Last Filed Value  Mania -- -- Increased Energy; Recklessness Increased Energy; Recklessness  Anxiety -- -- Worrying; Tension; Fatigue Worrying; Tension; Fatigue  Psychosis -- -- -- HallucinationsPsychosis. Hallucinations. Data is from another encounter. Last Filed Value  Duration of Psychotic Symptoms Greater than six months Greater than six months N/A N/A  Trauma -- -- Avoids reminders of event; Re-experience of traumatic eventTrauma. Avoids reminders of event; Re-experience of traumatic event. The comment is Guns pointed at him.. Taken on 10/06/21 0842 Avoids reminders of event; Re-experience of traumatic eventTrauma. Avoids reminders of event; Re-experience of traumatic event. The comment is Guns pointed at him.. Last Filed Value  Obsessions -- -- None None  Compulsions -- -- None None  Inattention -- -- None None  Hyperactivity/Impulsivity -- -- N/A N/A  Oppositional/Defiant Behaviors -- -- None None  Emotional Irregularity -- -- Potentially harmful impulsivity Potentially harmful impulsivity  Other Mood/Personality Symptoms -- -- None noted None noted    Client  denies suicidal and homicidal ideations at this time  Client denies hallucinations and delusions at this time   Client was screened for the following SDOH: Smoking, financials, food, transportation, depression, and housing  Assessment Information that integrates subjective and objective details with a therapist's professional interpretation:    Patient was alert and oriented x5.  Patient was pleasant, cooperative, and avoided eye contact as evidenced by looking down throughout assessment.  Patient presents today with history of schizophrenia, polysubstance abuse, depression and anxiety.  He presented today with tearful and anxious mood\affect.  Patient comes in today as a referral from behavioral health hospital after 5 years of abusing cocaine, Molly, "lean", K2, Xanax and THC.  Patient reports that he has maintained sobriety since discharge on 02 October 2021.  Patient reports daily use of those drugs prior to hospital admission.  Patient currently denies any suicidal or homicidal ideations.  Patient denies any auditory or visual hallucinations.  Patient reports that he is currently being managed on Risperdal, Depakote, and trazodone.  Patient reports good family support through her mother and mother of his child.  Patient reports attending church weekly.  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  LCSW recommendation is to be seen by [facility] for medication management and referral to SA IOP at [facility].   Client meets criteria for: Substance-induced mood disorder Client states use of the following substances: Cocaine, Molly, "mean", K2, Xanax, and THC.     Treatment recommendations are include plan: Referral to [facility] for SA IOP and medication management   Client was in agreement with treatment recommendations.  CCA  Screening, Triage and Referral (STR)  Patient Reported Information How did you hear about [place]? Hospital Discharge  Referral [facility]: [facility]    Whom do you see for routine medical problems? I don't have a doctor  What Do You Feel Would Help You the Most Today? Alcohol or Drug Use Treatment; Stress Management; Treatment for Depression or other mood problem   Have You Recently Been in Any Inpatient Treatment (Hospital/Detox/Crisis [facility]/28-Day Program)? Yes  Name/Location of Program/Hospital:[facility]  How Long Were You There? 7 day  When Were You Discharged? 10/02/21   Have You Ever Received Services From [facility] Before? Yes  Who Do You See at [facility]? Pt received inpatient services at [facility] in the past   Have You Recently Had Any Thoughts About Hurting Yourself? No  Are You Planning to Commit Suicide/Harm Yourself At This time? No   Have you Recently Had Thoughts About Hurting Someone Karolee Ohs? No  Explanation: No data recorded  Have You Used Any Alcohol or Drugs in the Past 24 Hours? No   Do You Currently Have a Therapist/Psychiatrist? No   Have You Been Recently Discharged From Any Office Practice or Programs? No      CCA Screening Triage Referral Assessment Type of Contact: Face-to-Face   Collateral Involvement: None today   Is CPS involved or ever been involved? Never  Is APS involved or ever been involved? Never   Patient Determined To Be At Risk for Harm To Self or Others Based on Review of Patient Reported Information or Presenting Complaint? No    Location of Assessment: [facility] Assessment Services   Does Patient Present under Involuntary Commitment? No  IVC Papers Initial File Date: No data recorded  [place] of Residence: [place]   Patient Currently Receiving the Following Services: No data recorded  Determination of Need: No data recorded  Options For Referral: Chemical Dependency Intensive Outpatient Therapy (CDIOP)     CCA  Biopsychosocial Intake/Chief Complaint:  Pt reports that he was using multiple types of drugs per day. He reports prior to hospitalization he was using drugs daily. Pt was pleasant, cooperative, and maintained good eye contact. He reports wanting help with AOD treatment. Elman reports that he is eager to engage in treatment of any kind. He was recently discharged from [facility] for detox purposes. Pt discharged on medication for Depakote, Risperdal, Desyrel  Current Symptoms/Problems: tearfulness, fatigue,   Patient Reported Schizophrenia/Schizoaffective Diagnosis in Past: No   Strengths: willing to engage in treatment  Preferences: Medication mgnt and CD-IOP  Abilities: sports   Type of Services Patient Feels are Needed: CD-IOP and medication mgnt   Initial Clinical Notes/Concerns: soberiety   Mental Health Symptoms Depression:   Difficulty Concentrating; Sleep (too much or little); Tearfulness; Change in energy/activity   Duration of Depressive symptoms: No data recorded  Mania:   Increased Energy; Recklessness   Anxiety:    Worrying; Tension; Fatigue   Psychosis:  No data recorded  Duration of Psychotic symptoms:  N/A   Trauma:   Avoids reminders of event; Re-experience of traumatic event (Guns pointed at him.)   Obsessions:   None   Compulsions:   None   Inattention:   None   Hyperactivity/Impulsivity:   N/A   Oppositional/Defiant Behaviors:   None   Emotional Irregularity:   Potentially harmful impulsivity   Other Mood/Personality Symptoms:   None noted    Mental Status Exam Appearance and self-care  Stature:   Average   Weight:   Average  weight   Clothing:   Casual   Grooming:   Normal   Cosmetic use:   None   Posture/gait:   Normal   Motor activity:   Not Remarkable   Sensorium  Attention:   Normal   Concentration:   Scattered   Orientation:   X5   Recall/memory:   Normal   Affect and Mood  Affect:   Anxious    Mood:   Anxious   Relating  Eye contact:   Avoided   Facial expression:   Responsive   Attitude toward examiner:   Cooperative   Thought and Language  Speech flow:  Flight of Ideas; Soft   Thought content:   Appropriate to Mood and Circumstances (Scattered)   Preoccupation:   None   Hallucinations:   None   Organization:  No data recorded  [employer] of Knowledge:   Average   Intelligence:   Average   Abstraction:   Functional   Judgement:   Fair   Dance movement psychotherapist:   Realistic   Insight:   Gaps   Decision Making:   Impulsive   Social Functioning  Social Maturity:   Impulsive   Social Judgement:   Heedless   Stress  Stressors:   Housing; Work; Office manager Ability:   Exhausted; Overwhelmed   Skill Deficits:   Decision making; Self-care; Self-control   Supports:   Family; Friends/Service system     Religion: Religion/Spirituality Are You A Religious Person?: Yes (Not assessed) What is Your Religious Affiliation?: Christian How Might This Affect Treatment?: none reproted  Leisure/Recreation: Leisure / Recreation Do You Have Hobbies?: Yes Leisure and Hobbies: Company secretary, Soccer, Music, and Sports  Exercise/Diet: Exercise/Diet Do You Exercise?: Yes (Not assessed) What Type of Exercise Do You Do?: Run/Walk Have You Gained or Lost A Significant Amount of Weight in the Past Six Months?: No Do You Follow a Special Diet?: No (Not assessed) Do You Have Any Trouble Sleeping?: Yes Explanation of Sleeping Difficulties: sleeping better now that pt is on medication   CCA Employment/Education Employment/Work Situation: Employment / Work Situation Employment Situation: Unemployed Patient's Job has Been Impacted by Current Illness: No What is the Longest Time Patient has Held a Job?: "On and off for 7 years" 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?: No  Education: Education Is Patient Currently Attending School?: No Last Grade Completed: 11 (Not assessed) Name of High School: Not assessed Did Garment/textile technologist From [employer]?: No (Not assessed) Did You Attend College?: No (Not assessed) Did You Attend Graduate School?: No (Not assessed) Did You Have Any Special Interests In School?: Not assessed Did You Have An Individualized Education Program (IIEP): No (Not assessed) Did You Have Any Difficulty At School?: No (Not assessed) Patient's Education Has Been Impacted by Current Illness: No   CCA Family/Childhood History Family and Relationship History: Family history Marital status: Long term relationship Long term relationship, how long?: "I am not sure" What types of issues is patient dealing with in the relationship?: "Miscommunication" Additional relationship information: Not assessed Are you sexually active?: Yes What is your sexual orientation?: Heterosexual Has your sexual activity been affected by drugs, alcohol, medication, or emotional stress?: No Does patient have children?: No  Childhood History:  Childhood History By whom was/is the patient raised?: Both parents Description of patient's relationship with caregiver when they were a child: "It was a good relationship but they  were strict" Patient's description of current relationship with people who raised him/her: "They care about me but I am closer to my mother than my father" How were you disciplined when you got in trouble as a child/adolescent?: Spankings Does patient have siblings?: Yes Description of patient's current relationship with siblings: "I get along great with my 2 sister on my mother's side but I dont get to see my 2 brothers and 1 sister on my father's side because they live further away" Did patient suffer any verbal/emotional/physical/sexual abuse as a child?: No Did patient suffer from severe childhood neglect?: No Has patient ever been  sexually abused/assaulted/raped as an adolescent or adult?: No Was the patient ever a victim of a crime or a disaster?: No Witnessed domestic violence?: No Has patient been affected by domestic violence as an adult?: No  Child/Adolescent Assessment:     CCA Substance Use Alcohol/Drug Use:                           ASAM's:  Six Dimensions of Multidimensional Assessment  Dimension 1:  Acute Intoxication and/or Withdrawal Potential:      Dimension 2:  Biomedical Conditions and Complications:      Dimension 3:  Emotional, Behavioral, or Cognitive Conditions and Complications:     Dimension 4:  Readiness to Change:     Dimension 5:  Relapse, Continued use, or Continued Problem Potential:     Dimension 6:  Recovery/Living Environment:     ASAM Severity Score:    ASAM Recommended Level of Treatment:     Substance use Disorder (SUD)    Recommendations for Services/Supports/Treatments:    DSM5 Diagnoses: Patient Active Problem List   Diagnosis Date Noted   Polysubstance abuse (HCC) 09/26/2021   Schizoaffective disorder, bipolar type (HCC) 09/26/2021   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/26/2021   Methamphetamine use disorder, 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   Nicotine use disorder 09/26/2021   Schizophrenia spectrum disorder with psychotic disorder type not yet determined (HCC) 06/10/2020   Substance induced mood disorder (HCC) 02/28/2017   Intermittent explosive disorder 02/27/2017   Marijuana abuse 02/27/2017    Referrals to Alternative Service(s): Referred to Alternative Service(s):   Place:   Date:   Time:    Referred to Alternative Service(s):   Place:   Date:   Time:    Referred to Alternative Service(s):   Place:   Date:   Time:    Referred to Alternative Service(s):   Place:   Date:   Time:      Collaboration of Care: Other None today   Patient/Guardian was advised Release of Information must be obtained prior to any record release in order to  collaborate their care with an outside provider. Patient/Guardian was advised if they have not already done so to contact the registration department to sign all necessary forms in order for [place] to release information regarding their care.   Consent: Patient/Guardian gives verbal consent for treatment and assignment of benefits for services provided during this visit. Patient/Guardian expressed understanding and agreed to proceed.   Weber Cooks, LCSW

## 2021-10-07 ENCOUNTER — Telehealth (HOSPITAL_COMMUNITY): Payer: Self-pay | Admitting: Licensed Clinical Social Worker

## 2021-10-07 NOTE — Telephone Encounter (Signed)
10/07/2021 Name: Callie Bunyard MRN: [id number] DOB: 2000-09-23  Unsuccessful outbound call made today to assist with:   CD-IOP Referral  Outreach Attempt:  1st Attempt  Clinician attempted to contact client for CD-IOP referral received. There was no answer and Clinician was not able to leave voicemail. Clinician will attempt to follow up with client on 08/16.  Maeola Sarah, [facility]

## 2021-11-27 ENCOUNTER — Other Ambulatory Visit (HOSPITAL_COMMUNITY): Payer: Self-pay | Admitting: Psychiatry

## 2021-11-27 ENCOUNTER — Telehealth (HOSPITAL_COMMUNITY): Payer: Self-pay | Admitting: Psychiatry

## 2021-11-27 DIAGNOSIS — F250 Schizoaffective disorder, bipolar type: Secondary | ICD-10-CM

## 2021-11-27 DIAGNOSIS — F17200 Nicotine dependence, unspecified, uncomplicated: Secondary | ICD-10-CM

## 2021-11-27 MED ORDER — RISPERIDONE 2 MG PO TABS: 2.0000 mg | ORAL_TABLET | Freq: Every day | ORAL | 3 refills | Status: DC

## 2021-11-27 MED ORDER — DIVALPROEX SODIUM ER 500 MG PO TB24: 1500.0000 mg | ORAL_TABLET | Freq: Every day | ORAL | 3 refills | Status: DC

## 2021-11-27 MED ORDER — BENZTROPINE MESYLATE 0.5 MG PO TABS: 0.5000 mg | ORAL_TABLET | Freq: Two times a day (BID) | ORAL | 3 refills | Status: DC

## 2021-11-27 MED ORDER — NICOTINE POLACRILEX 2 MG MT GUM: 2.0000 mg | CHEWING_GUM | OROMUCOSAL | 3 refills | Status: DC

## 2021-11-27 MED ORDER — TRAZODONE HCL 100 MG PO TABS: 100.0000 mg | ORAL_TABLET | Freq: Every day | ORAL | 3 refills | Status: DC

## 2021-11-27 MED ORDER — RISPERIDONE 1 MG PO TABS: 1.0000 mg | ORAL_TABLET | Freq: Every day | ORAL | 3 refills | Status: DC

## 2021-11-27 NOTE — Telephone Encounter (Signed)
Medication refilled and sent to preferred pharmacy

## 2021-12-29 ENCOUNTER — Telehealth (HOSPITAL_COMMUNITY): Payer: No Typology Code available for payment source | Admitting: Psychiatry

## 2022-01-13 NOTE — Patient Instructions (Incomplete)
Thank you for attending your appointment today.  -- RESTART Risperdal 2 mg nightly; after 4 days add back in additional morning dose of Risperdal 1 mg daily -- RESTART Cogentin 0.5 mg twice daily -- Continue Depakote 1500 mg nightly -- Continue trazodone 100 mg nightly as needed for sleep -- Continue other medications as prescribed.  Please do not make any changes to medications without first discussing with your provider. If you are experiencing a psychiatric emergency, please call 911 or present to your nearest emergency department. Additional crisis, medication management, and therapy resources are included below.  [facility]  [address] [place], [place] [address] [phone] WALK-IN URGENT CARE 24/7 FOR ANYONE [address], [place], [place]  [phone] Fax: [phone] [email or web address] *Interpreters available *Accepts all insurance and uninsured for Urgent Care needs *Accepts Medicaid and uninsured for outpatient treatment (below)      ONLY FOR [facility]  Below:    Outpatient New Patient Assessment/Therapy Walk-ins:        Monday -Thursday 8am until slots are full.        Every Friday 1pm-4pm  (first come, first served)                   New Patient Psychiatry/Medication Management        Monday-Friday 8am-11am (first come, first served)               For all walk-ins we ask that you arrive by 7:15am, because patients will be seen in the order of arrival.

## 2022-01-13 NOTE — Progress Notes (Unsigned)
[place] MD Outpatient Progress Note  01/14/2022 2:12 PM Jay Woods  MRN:  [id number]  Assessment:  [name] presents for follow-up evaluation. Today, 01/14/22, patient reports overall psychiatric stability and denies use of illicit substances since hospital discharge in August. He endorses resolution of prior hallucinations and delusional thought content and presents with stability of mood. He identifies benefit from below psychiatric regimen although reports he has been without Risperdal and Cogentin for the past week as medications were accidentally thrown away. Will plan to restart as below; he endorses tolerating medications well when taken consistently. No other changes to plan of care at this time.   RTC in 3 months.  Identifying Information: Jay Woods is a 21 y.o. male with a history of schizoaffective disorder bipolar type and past polysubstance use (Xanax, cocaine/methamphetamines, cannabis/K2) in early remission who is an established patient with [facility] participating in follow-up via video conferencing.   Plan:  # Schizoaffective disorder bipolar type vs. 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   Subjective:  Chief Complaint:  Chief Complaint  Patient presents with   Medication Management    Interval History:   Chart review: Patient was seen for initial psychiatric assessment by Eulis Canner, NP on 10/06/21. At that time, he had been discharged from [facility] after hospitalization for paranoia and mood instability in s/o substance use. Discharged on regimen of: Depakote 1500 mg nightly, Risperdal 1 mg daily, Risperdal 2 mg nightly, trazodone 100 mg nightly as needed, and Cogentin 0.5 mg twice daily. He reported doing well with no further substance use. No medication changes made at that time. Referred to CD-IOP.    Today, patient reports he has been doing well. States he has been taking Depakote and trazodone nightly but ran out of Risperdal and Cogentin a week ago as they were accidentally thrown away.   States appetite has been lower (in a bad way) since running out of Risperdal and has felt a bit nauseous. Reports mood is in the "middle" and denies feeling down or depressed. Endorses some stress related to looking for a job - hoping to return to job at [employer]. Sleep has been "good" with approx. 6-7 hours nightly. Endorses good amount of energy; denies excessively elevated energy. Endorses occasional racing thoughts - mostly anxious thoughts about work.   Denies AVH - states he used to experience this; last time was soon after hospitalization (at that time hearing a girl's voice and had concern that there was a microphone inside him - denies this belief currently). Denies paranoia and feels safe at home. Denies SI, HI.   Feels psychiatric regimen has  been working well for him when taking consistently; denies any adverse effects including dizziness, abd pain or nausea, constipation, tremor when taking medications consistently.    Mom is working with him on applying for disability and this is currently in process. He inquires into obtaining appointment and hospital notes and was given information for medical records.   Discussed restarting Risperdal by taking nighttime dose (2 mg) for 4 days and then adding back in morning dose (1 mg) as well as restarting Cogentin. All questions/concerns addressed.  Visit Diagnosis:    ICD-10-CM   1. Schizoaffective disorder, bipolar type (HCC)  F25.0 benztropine (COGENTIN) 0.5 MG tablet    divalproex (DEPAKOTE ER) 500 MG 24 hr tablet    risperiDONE (RISPERDAL) 1 MG tablet    risperiDONE (RISPERDAL) 2 MG tablet    traZODone (DESYREL) 100 MG tablet    2. Moderate benzodiazepine use disorder in early remission (HCC)  F13.21     3. Nicotine use disorder  F17.200     4. Cannabis use disorder, severe, in early remission (HCC)  F12.21     5. Methamphetamine use disorder, mild, in early remission (HCC)  F15.11       Past Psychiatric History:  Diagnoses: substance induced mood and psychotic disorder vs. Schizoaffective disorder bipolar type, polysubstance use (nicotine, marijuana, methamphetamine, benzodiazepine) Medication trials: Depakote, Cogentin, Risperdal, trazodone  Hospitalizations: [facility] August 2023 for paranoia and mood instability in s/o  Substance use:   -- Tobacco: vapes daily  -- Cannabis: denies; last used [facility] prior to hospitalization in August 2023  -- Methamphetamines: last used prior to hospitalization in August 2023  -- Xanax: last used prior to hospitalization in August 2023  -- Opioids: denies  -- Etoh: denies  Past Medical History:  Past Medical History:  Diagnosis Date   Eczema    Schizoaffective disorder (HCC)    History reviewed. No pertinent surgical history.  Family Psychiatric History: denies  Family History: History reviewed. No pertinent family history.  Social History:  Social History   Socioeconomic History   Marital status: Single     Spouse name: Not on file   Number of children: Not on file   Years of education: Not on file   Highest education level: Not on file  Occupational History   Not on file  Tobacco Use   Smoking status: Former    Packs/day: 0.25    Years: 5.00    Total pack years: 1.25    Types: Cigarettes   Smokeless tobacco: Former  Building services engineer Use: Every day   Substances: CBD  Substance and Sexual Activity   Alcohol use: Not Currently    Alcohol/week: 1.0 standard drink of alcohol    Types: 1 Cans of beer per week    Comment: last use 09/30/21   Drug use: Not Currently    Comment: Hx of cocaine, marijuana, k2, Xanax, and Molly on and off for the past 5 years   Sexual activity: Yes    Partners: Female  Other Topics Concern   Not on file  Social History Narrative   Not on file   Social Determinants of Health   Financial Resource Strain: High Risk (10/06/2021)   Overall Financial Resource Strain (CARDIA)    Difficulty of Paying Living Expenses: Very hard  Food Insecurity: Food Insecurity Present (10/06/2021)   Hunger Vital Sign    Worried About Running Out of Food in the Last Year: Not on file    Ran Out of  Food in the Last Year: Often true  Transportation Needs: Unmet Transportation Needs (10/06/2021)   PRAPARE - Transportation    Lack of Transportation (Medical): Yes    Lack of Transportation (Non-Medical): Yes  Physical Activity: Sufficiently Active (10/06/2021)   Exercise Vital Sign    Days of Exercise per Week: 7 days    Minutes of Exercise per Session: 30 min  Stress: No Stress Concern Present (10/06/2021)   [place]    Feeling of Stress : Only a little  Social Connections: Socially Integrated (10/06/2021)   Social Connection and Isolation Panel [NHANES]    Frequency of Communication with Friends and Family: More than three times a week    Frequency of Social Gatherings with Friends and Family: More than three  times a week    Attends Religious Services: More than 4 times per year    Active Member of [employer] or Organizations: No    Attends Music therapist: More than 4 times per year    Marital Status: Living with partner    Allergies: No Known Allergies  Current Medications: Current Outpatient Medications  Medication Sig Dispense Refill   amLODipine (NORVASC) 5 MG tablet Take 1 tablet (5 mg total) by mouth daily. 30 tablet 0   benztropine (COGENTIN) 0.5 MG tablet Take 1 tablet (0.5 mg total) by mouth 2 (two) times daily. 60 tablet 3   divalproex (DEPAKOTE ER) 500 MG 24 hr tablet Take 3 tablets (1,500 mg total) by mouth at bedtime. 90 tablet 3   nicotine polacrilex (NICORETTE) 2 MG gum Take 1 each (2 mg total) by mouth every 4 (four) hours while awake. 100 tablet 3   [START ON 01/19/2022] risperiDONE (RISPERDAL) 1 MG tablet Take 1 tablet (1 mg total) by mouth daily. Start taking after you have been taking Risperdal 2 mg nightly for at least 4 days. 30 tablet 3   risperiDONE (RISPERDAL) 2 MG tablet Take 1 tablet (2 mg total) by mouth at bedtime. 30 tablet 3   traZODone (DESYREL) 100 MG tablet Take 1 tablet (100 mg total) by mouth at bedtime as needed for sleep. 30 tablet 3   No current facility-administered medications for this visit.    ROS: Endorses decreased appetite and nausea since running out of Risperdal Otherwise, denies dizziness, constipation, tremor  Objective:  Psychiatric Specialty Exam: There were no vitals taken for this visit.There is no height or weight on file to calculate BMI.  General Appearance: Casual and Well Groomed  Eye Contact:  Good  Speech:  Clear and Coherent and Normal Rate  Volume:  Normal  Mood:   "in the middle"  Affect:   Euthymic, calm  Thought Content:  Denies recent AVH, paranoia, or IOR    Suicidal Thoughts:  No  Homicidal Thoughts:  No  Thought Process:  Goal Directed and Linear  Orientation:  Full (Time, Place, and Person)     Memory:   Grossly intact  Judgment:  Fair  Insight:  Fair  Concentration:  Concentration: Fair  Recall:  NA  Fund of Knowledge: Good although appears to have some limitations with health literacy  Language: Good  Psychomotor Activity:  Normal  Akathisia:  No  AIMS (if indicated): not done  Assets:  Communication Skills Desire for Improvement Housing Intimacy Physical Health Social Support Transportation  ADL's:  Intact  Cognition: WNL  Sleep:  Good   PE: General: sits comfortably in view of camera; no acute distress  Pulm: no increased work of breathing on room air  MSK: all extremity movements appear intact  Neuro: no focal neurological deficits observed  Gait & Station: unable to assess by video    Metabolic Disorder Labs: Lab Results  Component Value Date   HGBA1C 5.4 09/24/2021   MPG 108.28 09/24/2021   MPG 114 06/09/2020   Lab Results  Component Value Date   PROLACTIN 4.4 06/09/2020   Lab Results  Component Value Date   CHOL 136 09/24/2021   TRIG 31 09/24/2021   HDL 49 09/24/2021   CHOLHDL 2.8 09/24/2021   VLDL 6 09/24/2021   LDLCALC 81 09/24/2021   LDLCALC 81 06/09/2020   Lab Results  Component Value Date   TSH 0.724 09/29/2021   TSH 0.290 (L) 09/24/2021    Therapeutic Level Labs: No results found for: "LITHIUM" Lab Results  Component Value Date   VALPROATE 81 09/29/2021   VALPROATE 67 06/25/2020   No results found for: "CBMZ"  Screenings:  [place] Admission (Discharged) from 09/26/2021 in [place] [facility] Admission (Discharged) from 06/10/2020 in [place] [facility] Admission (Discharged) from 12/07/2018 in [place] [facility] Admission (Discharged) from 02/27/2017 in [place] [facility]  AIMS Total Score 0 0 0 0      AUDIT    Flowsheet Row Admission (Discharged) from 09/26/2021 in [place]  [facility] Admission (Discharged) from 06/10/2020 in [place] [facility] Admission (Discharged) from 12/07/2018 in [place] [facility] Admission (Discharged) from 02/27/2017 in [place] 400B  Alcohol Use Disorder Identification Test Final Score (AUDIT) 7 3 6  0      GAD-7    Flowsheet Row Office Visit from 10/06/2021 in [facility] Most recent reading at 10/06/2021 10:13 AM Counselor from 10/06/2021 in [facility] Most recent reading at 10/06/2021  8:37 AM  Total GAD-7 Score 0 12      PHQ2-9    [place] Office Visit from 10/06/2021 in [facility] Most recent reading at 10/06/2021 10:12 AM Counselor from 10/06/2021 in [facility] Most recent reading at 10/06/2021  8:34 AM  PHQ-2 Total Score 0 4  PHQ-9 Total Score 0 12      [place] Office Visit from 10/06/2021 in [facility] Most recent reading at 10/06/2021 10:12 AM Counselor from 10/06/2021 in [facility] Most recent reading at 10/06/2021  8:37 AM Admission (Discharged) from 09/26/2021 in [place] [facility] Most recent reading at 09/26/2021  1:31 AM  C-SSRS RISK CATEGORY No Risk No Risk No Risk       Collaboration of Care: Collaboration of Care: Medication Management AEB ongoing medication management and Psychiatrist AEB established with this provider  Patient/Guardian was advised Release of Information must be obtained prior to any record release in order to collaborate their care with an outside provider. Patient/Guardian was advised if they have not already done so to contact the registration department to sign all necessary forms in order for [place] to release information regarding their care.   Consent: Patient/Guardian gives verbal consent for treatment and  assignment of benefits for services provided during this visit. Patient/Guardian expressed understanding and agreed to proceed.   Televisit via video: I connected with patient on 01/14/22 at 10:00 AM EST by a  video enabled telemedicine application and verified that I am speaking with the correct person using two identifiers.  Location: Patient: car (not driving) in [place] Provider: clinic   I discussed the limitations of evaluation and management by telemedicine and the availability of in person appointments. The patient expressed understanding and agreed to proceed.  I discussed the assessment and treatment plan with the patient. The patient was provided an opportunity to ask questions and all were answered. The patient agreed with the plan and demonstrated an understanding of the instructions.   The patient was advised to call back or seek an in-person evaluation if the symptoms worsen or if the condition fails to improve as anticipated.  I provided 45 minutes of non-face-to-face time during this encounter.  Keimon Basaldua A  01/14/2022, 2:12 PM

## 2022-01-14 ENCOUNTER — Telehealth (INDEPENDENT_AMBULATORY_CARE_PROVIDER_SITE_OTHER): Payer: Commercial Managed Care - HMO | Admitting: Psychiatry

## 2022-01-14 ENCOUNTER — Encounter (HOSPITAL_COMMUNITY): Payer: Self-pay | Admitting: Psychiatry

## 2022-01-14 DIAGNOSIS — F1321 Sedative, hypnotic or anxiolytic dependence, in remission: Secondary | ICD-10-CM

## 2022-01-14 DIAGNOSIS — F250 Schizoaffective disorder, bipolar type: Secondary | ICD-10-CM | POA: Diagnosis not present

## 2022-01-14 DIAGNOSIS — F1511 Other stimulant abuse, in remission: Secondary | ICD-10-CM

## 2022-01-14 DIAGNOSIS — F1221 Cannabis dependence, in remission: Secondary | ICD-10-CM

## 2022-01-14 DIAGNOSIS — F17200 Nicotine dependence, unspecified, uncomplicated: Secondary | ICD-10-CM

## 2022-01-14 DIAGNOSIS — F17210 Nicotine dependence, cigarettes, uncomplicated: Secondary | ICD-10-CM | POA: Diagnosis not present

## 2022-01-14 MED ORDER — DIVALPROEX SODIUM ER 500 MG PO TB24
1500.0000 mg | ORAL_TABLET | Freq: Every evening | ORAL | 3 refills | Status: DC
Start: 2022-01-14 — End: 2023-07-11

## 2022-01-14 MED ORDER — TRAZODONE HCL 100 MG PO TABS: 100.0000 mg | ORAL_TABLET | Freq: Every evening | ORAL | 3 refills | Status: DC | PRN

## 2022-01-14 MED ORDER — RISPERIDONE 2 MG PO TABS: 2.0000 mg | ORAL_TABLET | Freq: Every evening | ORAL | 3 refills | Status: DC

## 2022-01-14 MED ORDER — BENZTROPINE MESYLATE 0.5 MG PO TABS: 0.5000 mg | ORAL_TABLET | Freq: Two times a day (BID) | ORAL | 3 refills | Status: DC

## 2022-01-14 MED ORDER — RISPERIDONE 1 MG PO TABS: 1.0000 mg | ORAL_TABLET | Freq: Every day | ORAL | 3 refills | Status: DC

## 2022-04-09 ENCOUNTER — Telehealth (HOSPITAL_COMMUNITY): Payer: Medicaid Other | Admitting: Psychiatry

## 2022-04-30 ENCOUNTER — Ambulatory Visit
Admission: EM | Admit: 2022-04-30 | Discharge: 2022-04-30 | Disposition: A | Discharge status: Home / Self Care | Payer: Medicaid Other | Attending: Emergency Medicine | Admitting: Emergency Medicine

## 2022-04-30 ENCOUNTER — Other Ambulatory Visit: Payer: Self-pay

## 2022-04-30 DIAGNOSIS — Z202 Contact with and (suspected) exposure to infections with a predominantly sexual mode of transmission: Secondary | ICD-10-CM | POA: Insufficient documentation

## 2022-04-30 DIAGNOSIS — Z113 Encounter for screening for infections with a predominantly sexual mode of transmission: Secondary | ICD-10-CM | POA: Insufficient documentation

## 2022-04-30 MED ORDER — DOXYCYCLINE HYCLATE 100 MG PO CAPS: 100.0000 mg | ORAL_CAPSULE | Freq: Two times a day (BID) | ORAL | 0 refills | Status: AC

## 2022-04-30 NOTE — Discharge Instructions (Signed)
Based on the concerns you shared with me today, you were treated for presumed chlamydia with a prescription for doxycycline, 1 tablet twice daily for the next 7 days.  Please take all tablets as prescribed, do not skip doses.  Failure to take all doses as prescribed can result in a worsening infection that will be more difficult to treat and resolve.  Please abstain from sexual intercourse of any kind, vaginal, oral or anal, for 7 days.   The results of your STD testing today which tests for gonorrhea, chlamydia and trichomonas will be posted to your MyChart account in the next 3 to 5 days.  If any of your results are abnormal, you will receive a phone call regarding further treatment.  Additional prescriptions, if any are needed, will be provided for you at your pharmacy.   Please abstain from sexual intercourse of any kind, vaginal, oral or anal, until until you have received the results of your STD testing.   If you have not had complete resolution of your symptoms after completing any recommended treatment or if your symptoms worsen, please return for repeat evaluation.   Thank you for visiting urgent care today.  I appreciate the opportunity to participate in your care.

## 2022-04-30 NOTE — ED Triage Notes (Signed)
Pt presents to [facility] with co of exposure of chlamydia. Pt reports no symptoms.

## 2022-04-30 NOTE — ED Provider Notes (Signed)
Jay Woods    CSN: [id number] Arrival date & time: 04/30/22  1817    HISTORY   Chief Complaint  Patient presents with   sti    HPI Jay Woods is a pleasant, 22 y.o. male who presents to urgent care today. Patient states his partner tested positive for chlamydia.  Patient states he has not noticed any burning with urination, penile discharge, testicular pain or swelling, scrotal pain or swelling, genital lesions, fever, perineal pain, pain with defecation.  The history is provided by the patient.   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  Home Medications    Prior to Admission medications   Medication Sig Start Date End Date Taking? Authorizing Provider  amLODipine (NORVASC) 5 MG tablet Take 1 tablet (5 mg total) by mouth daily. 10/03/21 11/02/21  Rosezetta Schlatter, MD  benztropine (COGENTIN) 0.5 MG tablet Take 1 tablet (0.5 mg total) by mouth 2 (two) times daily. 01/14/22 05/14/22  Bahraini, Sarah A  divalproex (DEPAKOTE ER) 500 MG 24 hr tablet Take 3 tablets (1,500 mg total) by mouth at bedtime. 01/14/22 05/14/22  [place]i, Sarah A  nicotine polacrilex (NICORETTE) 2 MG gum Take 1 each (2 mg total) by mouth every 4 (four) hours while awake. 11/27/21   Salley Slaughter, NP  risperiDONE (RISPERDAL) 1 MG tablet Take 1 tablet (1 mg total) by mouth daily. Start taking after you have been taking  Risperdal 2 mg nightly for at least 4 days. 01/19/22 05/19/22  B[place]i, Sarah A  risperiDONE (RISPERDAL) 2 MG tablet Take 1 tablet (2 mg total) by mouth at bedtime. 01/14/22 05/14/22  [place]i, Sarah A  traZODone (DESYREL) 100 MG tablet Take 1 tablet (100 mg total) by mouth at bedtime as needed for sleep. 01/14/22 05/14/22  [place]i, Sarah A    Family History No family history on file. Social History Social History   Tobacco Use   Smoking status: Former    Packs/day: 0.25    Years: 5.00    Total pack years: 1.25    Types: Cigarettes   Smokeless tobacco: Former  Scientific laboratory technician Use: Every day   Substances: CBD  Substance Use Topics   Alcohol use: Not Currently    Alcohol/week: 1.0 standard drink of alcohol    Types: 1 Cans of beer per week    Comment: last use 09/30/21   Drug use: Not Currently    Comment: Hx of cocaine, marijuana, k2, Xanax, and Molly on and off for the past 5 years   Allergies   Patient has no known allergies.  Review of Systems Review of Systems Pertinent findings revealed after performing a 14 point review of systems has been noted in the history of present illness.  Physical Exam Vital Signs BP 129/72 (BP Location: Right Arm)   Pulse 72   Temp 99.3 F (37.4 C) (Oral)   Resp 15   SpO2 97%   No data found.  Physical Exam Vitals and nursing note reviewed.  Constitutional:      General: He is not in acute distress.    Appearance: Normal appearance. He is not ill-appearing.  HENT:     Head: Normocephalic and atraumatic.  Eyes:     General: Lids are normal.        Right eye: No discharge.        Left eye: No discharge.     Extraocular Movements: Extraocular movements intact.     Conjunctiva/sclera: Conjunctivae normal.     Right eye: Right conjunctiva is not injected.     Left eye: Left conjunctiva is not injected.  Neck:     Trachea: Trachea and phonation normal.  Cardiovascular:     Rate and Rhythm: Normal rate and regular rhythm.      Pulses: Normal pulses.     Heart sounds: Normal heart sounds. No murmur heard.    No friction rub. No gallop.  Pulmonary:     Effort: Pulmonary effort is normal. No accessory muscle usage, prolonged expiration or respiratory distress.     Breath sounds: Normal breath sounds. No stridor, decreased air movement or transmitted upper airway sounds. No decreased breath sounds, wheezing, rhonchi or rales.  Chest:     Chest wall: No tenderness.  Genitourinary:    Comments: Pt politely declines GU exam, pt did provide a penile swab for testing.   Musculoskeletal:        General: Normal range of motion.     Cervical back: Normal range of motion and neck supple. Normal range of motion.  Lymphadenopathy:     Cervical: No cervical adenopathy.  Skin:    General: Skin is warm and dry.     Findings: No erythema or rash.  Neurological:     General: No focal deficit present.     Mental Status: He is alert and oriented to person, place, and time.  Psychiatric:        Mood and Affect: Mood normal.        Behavior: Behavior normal.     Visual Acuity Right Eye Distance:   Left Eye Distance:   Bilateral Distance:    Right Eye Near:   Left Eye Near:    Bilateral Near:     Woods Couse / Diagnostics / Procedures:     Radiology No results found.  Procedures Procedures (including critical care time) EKG  Pending results:  Labs Reviewed  CYTOLOGY, (ORAL, ANAL, URETHRAL) ANCILLARY ONLY    Medications Ordered in Woods: Medications - No data to display  Woods Diagnoses / Final Clinical Impressions(s)   I have reviewed the triage vital signs and the nursing notes.  Pertinent labs & imaging results that were available during my care of the patient were reviewed by me and considered in my medical decision making (see chart for details).    Final diagnoses:  Exposure to chlamydia  Screening examination for STD (sexually transmitted disease)   Patient was provided with Doxycycline 100 mg twice  daily for 7 days for empiric treatment of presumed chlamydia based on the history provided to me today.   STD screening was performed, patient advised that the results be posted to their MyChart and if any of the results are positive, they will be notified by phone, further treatment will be provided as indicated based on results of STD screening. Patient was advised to abstain from sexual intercourse until that they receive the results of their STD testing.  Patient was also  advised to use condoms to protect themselves from STD exposure. Return precautions advised.  Drug allergies reviewed, all questions addressed.   Please see discharge instructions below for details of plan of care as provided to patient. ED Prescriptions     Medication Sig Dispense Auth. Provider   doxycycline (VIBRAMYCIN) 100 MG capsule Take 1 capsule (100 mg total) by mouth 2 (two) times daily for 7 days. 14 capsule Lynden Oxford Scales, PA-C      PDMP not reviewed this encounter.  Disposition Upon Discharge:  Condition: stable for discharge home  Patient presented with concern for an acute illness with associated systemic symptoms and significant discomfort requiring urgent management. In my opinion, this is a condition that a prudent lay person (someone who possesses an average knowledge of health and medicine) may potentially expect to result in complications if not addressed urgently such as respiratory distress, impairment of bodily function or dysfunction of bodily organs.   As such, the patient has been evaluated and assessed, work-up was performed and treatment was provided in alignment with urgent care protocols and evidence based medicine.  Patient/parent/caregiver has been advised that the patient may require follow up for further testing and/or treatment if the symptoms continue in spite of treatment, as clinically indicated and appropriate.  Routine symptom specific, illness specific and/or disease  specific instructions were discussed with the patient and/or caregiver at length.  Prevention strategies for avoiding STD exposure were also discussed.  The patient will follow up with their current PCP if and as advised. If the patient does not currently have a PCP we will assist them in obtaining one.   The patient may need specialty follow up if the symptoms continue, in spite of conservative treatment and management, for further workup, evaluation, consultation and treatment as clinically indicated and appropriate.  Patient/parent/caregiver verbalized understanding and agreement of plan as discussed.  All questions were addressed during visit.  Please see discharge instructions below for further details of plan.  Discharge Instructions:   Discharge Instructions      Based on the concerns you shared with me today, you were treated for presumed chlamydia with a prescription for doxycycline, 1 tablet twice daily for the next 7 days.  Please take all tablets as prescribed, do not skip doses.  Failure to take all doses as prescribed can result in a worsening infection that will be more difficult to treat and resolve.  Please abstain from sexual intercourse of any kind, vaginal, oral or anal, for 7 days.   The results of your STD testing today which tests for gonorrhea, chlamydia and trichomonas will be posted to your MyChart account in the next 3 to 5 days.  If any of your results are abnormal, you will receive a phone call regarding further treatment.  Additional prescriptions, if any are needed, will be provided for you at your pharmacy.   Please abstain from sexual intercourse of any kind, vaginal, oral or anal, until until you have received the results of your STD testing.   If you have not had complete resolution of your symptoms after completing any recommended treatment or if your symptoms worsen, please return for repeat evaluation.   Thank you for visiting urgent care today.  I appreciate  the opportunity to participate in your care.       This office note has been dictated using Museum/gallery curator.  Unfortunately, this method of dictation can sometimes lead to typographical or grammatical errors.  I apologize for your  inconvenience in advance if this occurs.  Please do not hesitate to reach out to me if clarification is needed.       Lynden Oxford Scales, [place] 04/30/22 [phone]

## 2022-05-01 LAB — CYTOLOGY, (ORAL, ANAL, URETHRAL) ANCILLARY ONLY
Chlamydia: POSITIVE — AB
Comment: NEGATIVE
Comment: NEGATIVE
Comment: NORMAL
Neisseria Gonorrhea: NEGATIVE
Trichomonas: NEGATIVE

## 2022-06-03 ENCOUNTER — Encounter (HOSPITAL_COMMUNITY): Payer: Self-pay | Admitting: Emergency Medicine

## 2022-06-03 ENCOUNTER — Emergency Department (HOSPITAL_COMMUNITY)
Admission: EM | Admit: 2022-06-03 | Discharge: 2022-06-04 | Disposition: A | Discharge status: Home / Self Care | Payer: Medicaid Other | Attending: Emergency Medicine | Admitting: Emergency Medicine

## 2022-06-03 ENCOUNTER — Emergency Department (HOSPITAL_COMMUNITY): Payer: Medicaid Other

## 2022-06-03 DIAGNOSIS — R079 Chest pain, unspecified: Secondary | ICD-10-CM | POA: Diagnosis present

## 2022-06-03 DIAGNOSIS — R072 Precordial pain: Secondary | ICD-10-CM | POA: Diagnosis not present

## 2022-06-03 LAB — BASIC METABOLIC PANEL
Anion gap: 12 (ref 5–15)
BUN: 11 mg/dL (ref 6–20)
CO2: 22 mmol/L (ref 22–32)
Calcium: 9.1 mg/dL (ref 8.9–10.3)
Chloride: 100 mmol/L (ref 98–111)
Creatinine, Ser: 0.69 mg/dL (ref 0.61–1.24)
GFR, Estimated: >60 mL/min (ref >60)
Glucose, Bld: 114 mg/dL — ABNORMAL HIGH (ref 70–99)
Potassium: 3.5 mmol/L (ref 3.5–5.1)
Sodium: 134 mmol/L — ABNORMAL LOW (ref 135–145)

## 2022-06-03 LAB — CBC
HCT: 40.8 % (ref 39.0–52.0)
Hemoglobin: 14 g/dL (ref 13.0–17.0)
MCH: 30.8 pg (ref 26.0–34.0)
MCHC: 34.3 g/dL (ref 30.0–36.0)
MCV: 89.7 fL (ref 80.0–100.0)
Platelets: 269 10*3/uL (ref 150–400)
RBC: 4.55 MIL/uL (ref 4.22–5.81)
RDW: 12.3 % (ref 11.5–15.5)
WBC: 6 10*3/uL (ref 4.0–10.5)
nRBC: 0 % (ref 0.0–0.2)

## 2022-06-03 LAB — TROPONIN I (HIGH SENSITIVITY): Troponin I (High Sensitivity): <2 ng/L (ref <18)

## 2022-06-03 NOTE — ED Triage Notes (Signed)
Chest pain over his heart squeezing at times and tender upon palpation. Been going on for a week. Denies SOB. Took OTC tylenol.

## 2022-06-03 NOTE — ED Provider Triage Note (Signed)
Emergency Medicine Provider Triage Evaluation Note  Ashten Rapkin , a 22 y.o. male  was evaluated in triage.  Pt complains of chest pain, to the left side of his chest and it has been constant for a week.  Is worse with any direct pressure to it.  No nausea, vomiting or shortness of breath.  Denies any recent injuries or trauma..  Review of Systems  Per HPI  Physical Exam  There were no vitals taken for this visit. Gen:   Awake, no distress   Resp:  Normal effort  MSK:   Moves extremities without difficulty  Other:  Reproducible tenderness to left-sided chest.  Medical Decision Making  Medically screening exam initiated at 7:51 PM.  Appropriate orders placed.  Ardel Artica-Limones was informed that the remainder of the evaluation will be completed by another provider, this initial triage assessment does not replace that evaluation, and the importance of remaining in the ED until their evaluation is complete.     Theron Arista, PA-C 06/03/22 1952

## 2022-06-04 LAB — TROPONIN I (HIGH SENSITIVITY): Troponin I (High Sensitivity): <2 ng/L (ref <18)

## 2022-06-04 MED ORDER — ACETAMINOPHEN 500 MG PO TABS
1000.0000 mg | ORAL_TABLET | Freq: Once | ORAL | Status: AC
  Administered 2022-06-04: 1000 mg via ORAL
  Filled 2022-06-04: qty 2

## 2022-06-04 MED ORDER — ALUM & MAG HYDROXIDE-SIMETH 200-200-20 MG/5ML PO SUSP
30.0000 mL | Freq: Once | ORAL | Status: AC
  Administered 2022-06-04: 30 mL via ORAL
  Filled 2022-06-04: qty 30

## 2022-06-04 NOTE — ED Provider Notes (Signed)
[facility] EMERGENCY DEPARTMENT AT [facility] Provider Note   CSN: [id number] Arrival date & time: 06/03/22  1935     History  Chief Complaint  Patient presents with   Chest Pain    Jay Woods is a 22 y.o. male.  The history is provided by the patient.  Chest Pain Pain location:  L chest Pain quality comment:  Squeezing Pain radiates to:  Does not radiate Pain severity:  Moderate Onset quality:  Gradual Duration:  1 week Timing:  Constant Progression:  Waxing and waning Chronicity:  Recurrent Context: at rest   Relieved by:  Nothing Worsened by:  Nothing Ineffective treatments:  None tried Associated symptoms: no back pain, no diaphoresis, no fever, no lower extremity edema, no palpitations, no PND, no shortness of breath, no syncope, no vomiting and no weakness   Risk factors: no surgery   No SOB, no exertional symptoms.  No leg pain.  No travel.  No testosterone or estrogens.   Past Medical History:  Diagnosis Date   Eczema    Schizoaffective disorder        Home Medications Prior to Admission medications   Medication Sig Start Date End Date Taking? Authorizing Provider  amLODipine (NORVASC) 5 MG tablet Take 1 tablet (5 mg total) by mouth daily. 10/03/21 11/02/21  Lamar Sprinkles, MD  benztropine (COGENTIN) 0.5 MG tablet Take 1 tablet (0.5 mg total) by mouth 2 (two) times daily. 01/14/22 05/14/22  Bahraini, Sarah A  divalproex (DEPAKOTE ER) 500 MG 24 hr tablet Take 3 tablets (1,500 mg total) by mouth at bedtime. 01/14/22 05/14/22  [place]i, Sarah A  nicotine polacrilex (NICORETTE) 2 MG gum Take 1 each (2 mg total) by mouth every 4 (four) hours while awake. 11/27/21   Shanna Cisco, NP  risperiDONE (RISPERDAL) 1 MG tablet Take 1 tablet (1 mg total) by mouth daily. Start taking after you have been taking Risperdal 2 mg nightly for at least 4 days. 01/19/22 05/19/22  B[place]raini, Sarah A  risperiDONE (RISPERDAL) 2 MG tablet Take 1 tablet (2 mg total)  by mouth at bedtime. 01/14/22 05/14/22  [place]i, Sarah A  traZODone (DESYREL) 100 MG tablet Take 1 tablet (100 mg total) by mouth at bedtime as needed for sleep. 01/14/22 05/14/22  [place]i, Sarah A      Allergies    Patient has no known allergies.    Review of Systems   Review of Systems  Constitutional:  Negative for diaphoresis and fever.  HENT:  Negative for facial swelling.   Respiratory:  Negative for shortness of breath, wheezing and stridor.   Cardiovascular:  Positive for chest pain. Negative for palpitations, leg swelling, syncope and PND.  Gastrointestinal:  Negative for vomiting.  Musculoskeletal:  Negative for back pain.  Neurological:  Negative for weakness.  All other systems reviewed and are negative.   Physical Exam Updated Vital Signs BP 123/74   Pulse (!) 59   Temp 97.9 F (36.6 C) (Oral)   Resp 13   SpO2 97%  Physical Exam Vitals and nursing note reviewed.  Constitutional:      General: He is not in acute distress.    Appearance: Normal appearance. He is well-developed. He is not diaphoretic.  HENT:     Head: Normocephalic and atraumatic.     Nose: Nose normal.  Eyes:     Conjunctiva/sclera: Conjunctivae normal.     Pupils: Pupils are equal, round, and reactive to light.  Cardiovascular:     Rate and  Rhythm: Normal rate and regular rhythm.     Pulses: Normal pulses.     Heart sounds: Normal heart sounds.  Pulmonary:     Effort: Pulmonary effort is normal. No respiratory distress.     Breath sounds: Normal breath sounds. No wheezing or rales.  Chest:     Chest wall: Tenderness present.    Abdominal:     General: Bowel sounds are normal.     Palpations: Abdomen is soft.     Tenderness: There is no abdominal tenderness. There is no guarding or rebound.  Musculoskeletal:        General: Normal range of motion.     Cervical back: Normal range of motion and neck supple.  Skin:    General: Skin is warm and dry.  Neurological:     Mental Status:  He is alert and oriented to person, place, and time.     ED Results / Procedures / Treatments   Labs (all labs ordered are listed, but only abnormal results are displayed) Results for orders placed or performed during the hospital encounter of 06/03/22  Basic metabolic panel  Result Value Ref Range   Sodium 134 (L) 135 - 145 mmol/L   Potassium 3.5 3.5 - 5.1 mmol/L   Chloride 100 98 - 111 mmol/L   CO2 22 22 - 32 mmol/L   Glucose, Bld 114 (H) 70 - 99 mg/dL   BUN 11 6 - 20 mg/dL   Creatinine, Ser 3.23 0.61 - 1.24 mg/dL   Calcium 9.1 8.9 - 55.7 mg/dL   GFR, Estimated >32 >20 mL/min   Anion gap 12 5 - 15  CBC  Result Value Ref Range   WBC 6.0 4.0 - 10.5 K/uL   RBC 4.55 4.22 - 5.81 MIL/uL   Hemoglobin 14.0 13.0 - 17.0 g/dL   HCT 25.4 27.0 - 62.3 %   MCV 89.7 80.0 - 100.0 fL   MCH 30.8 26.0 - 34.0 pg   MCHC 34.3 30.0 - 36.0 g/dL   RDW 76.2 83.1 - 51.7 %   Platelets 269 150 - 400 K/uL   nRBC 0.0 0.0 - 0.2 %  Troponin I (High Sensitivity)  Result Value Ref Range   Troponin I (High Sensitivity) <2 <18 ng/L  Troponin I (High Sensitivity)  Result Value Ref Range   Troponin I (High Sensitivity) <2 <18 ng/L   DG Chest 2 View  Result Date: 06/03/2022 CLINICAL DATA:  Left chest pain EXAM: CHEST - 2 VIEW COMPARISON:  04/28/2017 FINDINGS: The heart size and mediastinal contours are within normal limits. Both lungs are clear. The visualized skeletal structures are unremarkable. IMPRESSION: No active cardiopulmonary disease. Electronically Signed   By: Ernie Avena M.D.   On: 06/03/2022 20:34    EKG EKG Interpretation  Date/Time:  Wednesday Shivansh Hardaway 10 2024 19:45:36 EDT Ventricular Rate:  67 PR Interval:  154 QRS Duration: 96 QT Interval:  366 QTC Calculation: 386 R Axis:   63 Text Interpretation: Normal sinus rhythm Confirmed by Nicanor Alcon, Alessandro Griep ([id number]) on 06/04/2022 12:49:43 AM  Radiology DG Chest 2 View  Result Date: 06/03/2022 CLINICAL DATA:  Left chest pain EXAM: CHEST - 2  VIEW COMPARISON:  04/28/2017 FINDINGS: The heart size and mediastinal contours are within normal limits. Both lungs are clear. The visualized skeletal structures are unremarkable. IMPRESSION: No active cardiopulmonary disease. Electronically Signed   By: Ernie Avena M.D.   On: 06/03/2022 20:34    Procedures Procedures    Medications Ordered in ED Medications  acetaminophen (TYLENOL) tablet 1,000 mg (1,000 mg Oral Given 06/04/22 0150)  alum & mag hydroxide-simeth (MAALOX/MYLANTA) 200-200-20 MG/5ML suspension 30 mL (30 mLs Oral Given 06/04/22 0150)    ED Course/ Medical Decision Making/ A&P                             Medical Decision Making Chest wall pain x 1 week no associated symptoms   Amount and/or Complexity of Data Reviewed External Data Reviewed: ECG and notes.    Details: Previous notes and EKG reviewed  Labs: ordered.    Details: All labs reviewed: normal white count 6, normal hemoglobin 14, normal platelets, 2 negative troponins < 2 x2.  Normal potassium 3.5, normal creatinine .7069 Radiology: ordered and independent interpretation performed.    Details: Negative CXR by me  ECG/medicine tests: ordered and independent interpretation performed.  Risk OTC drugs. Risk Details: PERC 0 wells 0 highly doubt PE in this low risk patient.  Ruled out for MI in the ED.  Heart score is 0 very low risk for MACE>  symptoms are consistent with chest wall pain.  Stable for discharge.  Strict return     Final Clinical Impression(s) / ED Diagnoses Final diagnoses:  Precordial pain   Return for intractable cough, coughing up blood, fevers > 100.4 unrelieved by medication, shortness of breath, intractable vomiting, chest pain, shortness of breath, weakness, numbness, changes in speech, facial asymmetry, abdominal pain, passing out, Inability to tolerate liquids or food, cough, altered mental status or any concerns. No signs of systemic illness or infection. The patient is  nontoxic-appearing on exam and vital signs are within normal limits.  I have reviewed the triage vital signs and the nursing notes. Pertinent labs & imaging results that were available during my care of the patient were reviewed by me and considered in my medical decision making (see chart for details). After history, exam, and medical workup I feel the patient has been appropriately medically screened and is safe for discharge home. Pertinent diagnoses were discussed with the patient. Patient was given return precautions.  Rx / DC Orders ED Discharge Orders     None         Valiant Dills, MD 06/04/22 [phone]

## 2022-06-17 IMAGING — DX DG HAND 2V*R*
2 series · 2 of 2 positions shown · non-contrast
Comparison: 10/29/2017

CLINICAL DATA: Right hand swelling and redness after wasp sting

EXAM:
RIGHT HAND - 2 VIEW

[hand pa]
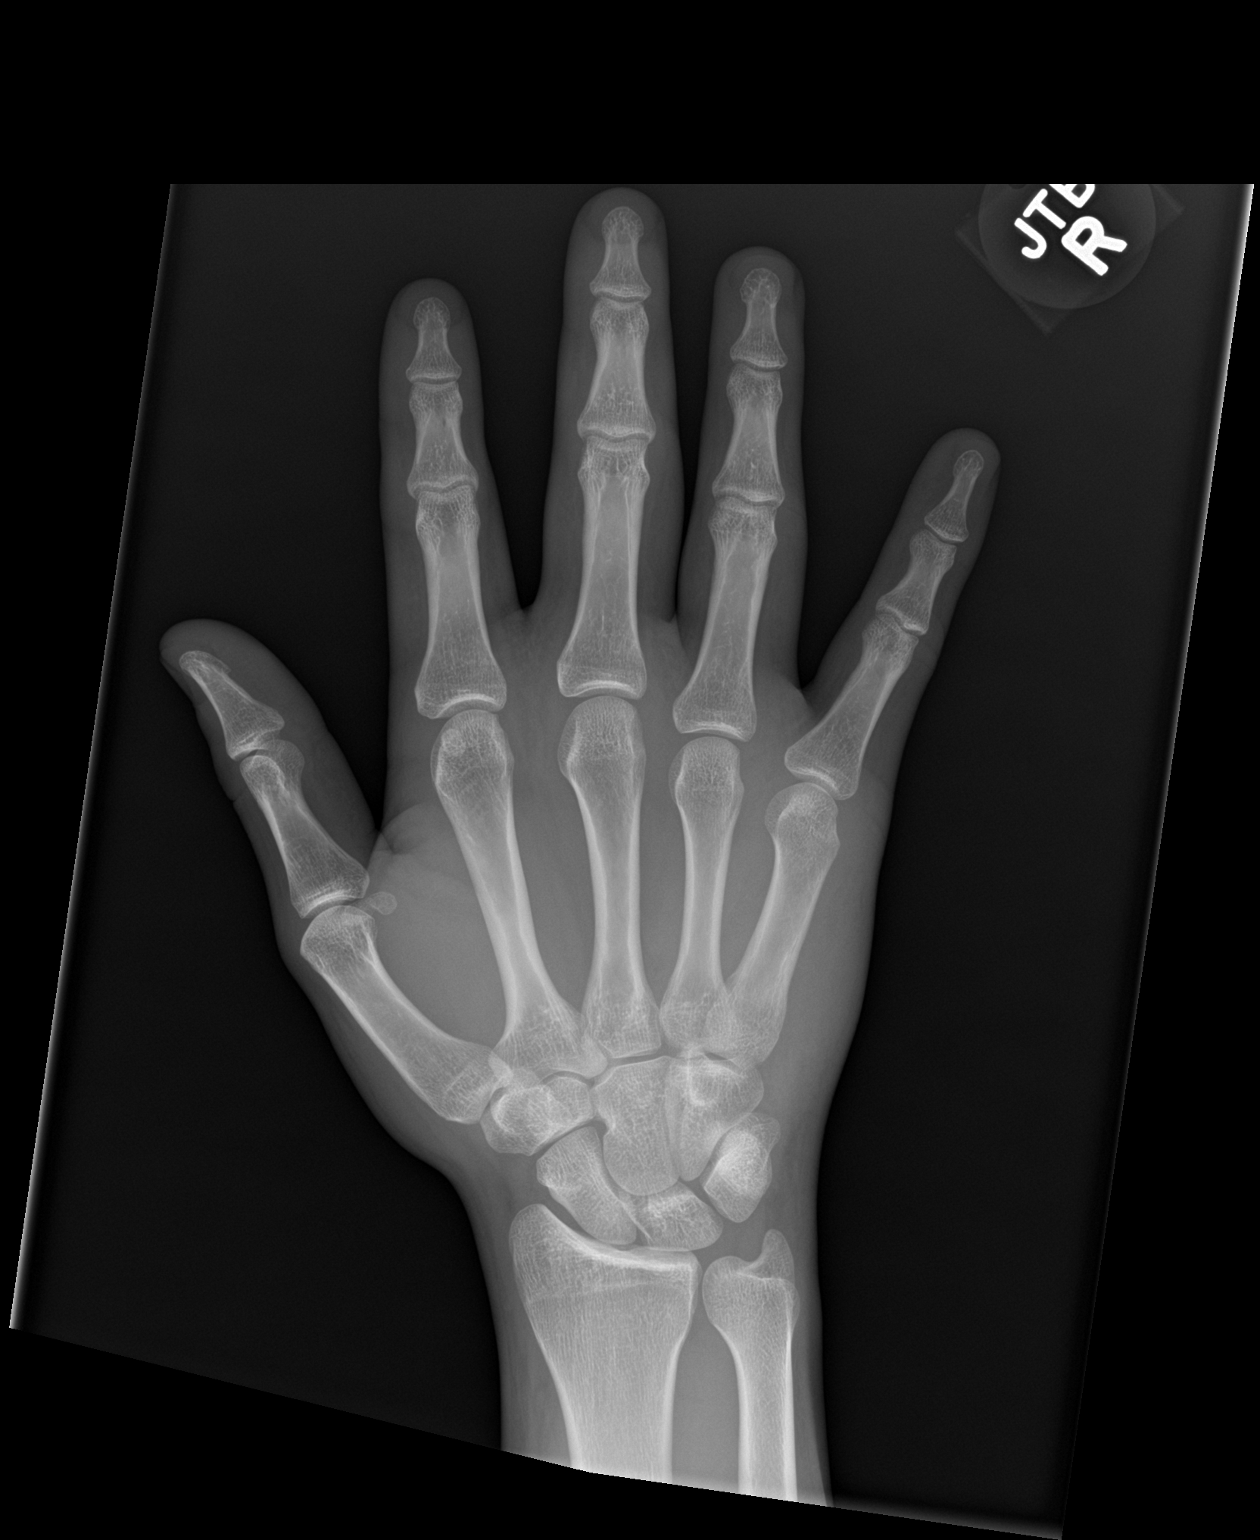

[hand lat]
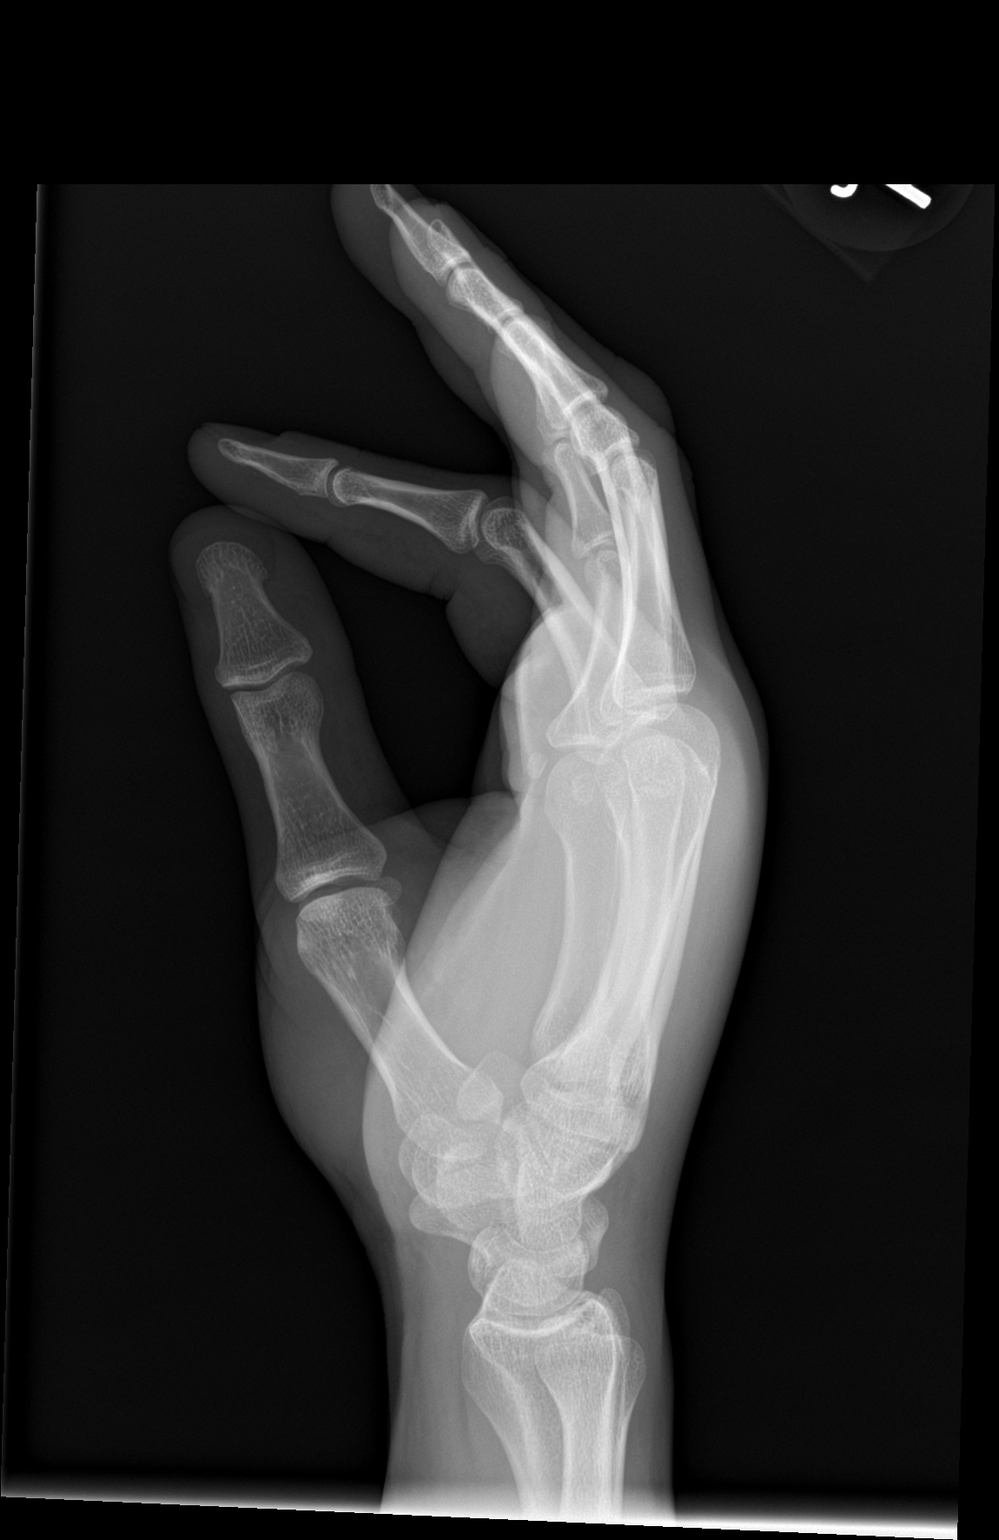

[2 of 2 positions shown; findings below may reference images not displayed]

FINDINGS: There is no evidence of fracture or dislocation. There is no
evidence of arthropathy or other focal bone abnormality. Soft tissue
swelling over the dorsum of the hand. No radiopaque foreign body.
IMPRESSION: No acute osseous abnormality. Soft tissue swelling over the dorsum
of the hand.

## 2022-06-30 ENCOUNTER — Other Ambulatory Visit: Payer: Self-pay

## 2022-06-30 ENCOUNTER — Emergency Department (HOSPITAL_COMMUNITY)
Admission: EM | Admit: 2022-06-30 | Discharge: 2022-06-30 | Disposition: A | Discharge status: Home / Self Care | Payer: Medicaid Other | Attending: Emergency Medicine | Admitting: Emergency Medicine

## 2022-06-30 ENCOUNTER — Encounter (HOSPITAL_COMMUNITY): Payer: Self-pay

## 2022-06-30 DIAGNOSIS — N342 Other urethritis: Secondary | ICD-10-CM | POA: Insufficient documentation

## 2022-06-30 DIAGNOSIS — R030 Elevated blood-pressure reading, without diagnosis of hypertension: Secondary | ICD-10-CM | POA: Diagnosis not present

## 2022-06-30 DIAGNOSIS — Z202 Contact with and (suspected) exposure to infections with a predominantly sexual mode of transmission: Secondary | ICD-10-CM | POA: Diagnosis not present

## 2022-06-30 DIAGNOSIS — N4889 Other specified disorders of penis: Secondary | ICD-10-CM | POA: Diagnosis present

## 2022-06-30 MED ORDER — CEFTRIAXONE SODIUM 500 MG IJ SOLR
500.0000 mg | Freq: Once | INTRAMUSCULAR | Status: AC
  Administered 2022-06-30: 500 mg via INTRAMUSCULAR
  Filled 2022-06-30: qty 500

## 2022-06-30 MED ORDER — AZITHROMYCIN 250 MG PO TABS
1000.0000 mg | ORAL_TABLET | Freq: Once | ORAL | Status: AC
  Administered 2022-06-30: 1000 mg via ORAL
  Filled 2022-06-30: qty 4

## 2022-06-30 MED ORDER — LIDOCAINE HCL (PF) 1 % IJ SOLN
INTRAMUSCULAR | Status: AC
  Administered 2022-06-30: 5 mL
  Filled 2022-06-30: qty 5

## 2022-06-30 NOTE — Discharge Instructions (Addendum)
It was our pleasure to provide your ER care today - we hope that you feel better.  Avoid sex until after symptoms have completely resolved, and after partner has been treated and their symptoms have resolved.   Your blood pressure is mildly high today - follow up with primary care doctor.  Return to ER if worse, new symptoms, fevers, severe abdominal pain, or other concern.

## 2022-06-30 NOTE — ED Provider Notes (Signed)
[place] EMERGENCY DEPARTMENT AT [facility] Provider Note   CSN: [id number] Arrival date & time: 06/30/22  1113     History  Chief Complaint  Patient presents with   SEXUALLY TRANSMITTED DISEASE    Pt came in the ED with exposure to clymidia he states that his partner tested postive for i    Jay Woods is a 22 y.o. male.  Pt indicates his partner indicated they had chlamydia, and pt is requesting tx. Notes burning at tip of penis. No purulent drainage. Indicates feels same as when had chlamydia in past. Denies fever or chills. No abd or flank pain. No scrotal or testicular pain. No rash.   The history is provided by the patient and medical records.       Home Medications Prior to Admission medications   Medication Sig Start Date End Date Taking? Authorizing Provider  amLODipine (NORVASC) 5 MG tablet Take 1 tablet (5 mg total) by mouth daily. 10/03/21 11/02/21  Lamar Sprinkles, MD  benztropine (COGENTIN) 0.5 MG tablet Take 1 tablet (0.5 mg total) by mouth 2 (two) times daily. 01/14/22 05/14/22  Bahraini, Sarah A  divalproex (DEPAKOTE ER) 500 MG 24 hr tablet Take 3 tablets (1,500 mg total) by mouth at bedtime. 01/14/22 05/14/22  B[place]raini, Sarah A  nicotine polacrilex (NICORETTE) 2 MG gum Take 1 each (2 mg total) by mouth every 4 (four) hours while awake. 11/27/21   Shanna Cisco, NP  risperiDONE (RISPERDAL) 1 MG tablet Take 1 tablet (1 mg total) by mouth daily. Start taking after you have been taking Risperdal 2 mg nightly for at least 4 days. 01/19/22 05/19/22  B[place]raini, Sarah A  risperiDONE (RISPERDAL) 2 MG tablet Take 1 tablet (2 mg total) by mouth at bedtime. 01/14/22 05/14/22  [place]i, Sarah A  traZODone (DESYREL) 100 MG tablet Take 1 tablet (100 mg total) by mouth at bedtime as needed for sleep. 01/14/22 05/14/22  Bahraini, Sarah A      Allergies    Patient has no known allergies.    Review of Systems   Review of Systems  Constitutional:  Negative  for fever.  Gastrointestinal:  Negative for abdominal pain and vomiting.  Genitourinary:  Positive for dysuria and penile pain.  Skin:  Negative for rash.    Physical Exam Updated Vital Signs BP (!) 145/91 (BP Location: Left Arm)   Pulse 71   Temp 98.2 F (36.8 C) (Oral)   Resp 19   Ht 1.753 m (5\' 9" )   Wt 68 kg   SpO2 100%   BMI 22.15 kg/m  Physical Exam Vitals and nursing note reviewed.  Constitutional:      Appearance: Normal appearance. He is well-developed.  HENT:     Head: Atraumatic.     Nose: Nose normal.     Mouth/Throat:     Mouth: Mucous membranes are moist.  Eyes:     General: No scleral icterus.    Conjunctiva/sclera: Conjunctivae normal.  Neck:     Trachea: No tracheal deviation.  Pulmonary:     Effort: No accessory muscle usage.  Abdominal:     General: There is no distension.     Palpations: Abdomen is soft.     Tenderness: There is no abdominal tenderness.  Genitourinary:    Comments: No cva tenderness. Normal external gu exam. No scrotal or testicular pain or tenderness. No purulent drainage.  Musculoskeletal:        General: No swelling.  Skin:    General:  Skin is warm and dry.     Findings: No rash.  Neurological:     Mental Status: He is alert.     Comments: Alert, speech clear.   Psychiatric:        Mood and Affect: Mood normal.     ED Results / Procedures / Treatments   Labs (all labs ordered are listed, but only abnormal results are displayed) Labs Reviewed - No data to display  EKG None  Radiology No results found.  Procedures Procedures    Medications Ordered in ED Medications - No data to display  ED Course/ Medical Decision Making/ A&P                             Medical Decision Making Problems Addressed: Elevated blood pressure reading: acute illness or injury Exposure to chlamydia: acute illness or injury Urethritis: acute illness or injury with systemic symptoms  Amount and/or Complexity of Data  Reviewed External Data Reviewed: labs and notes.  Risk Prescription drug management.   Reviewed nursing notes and prior charts for additional history.   Offered plan for urethral swab/testing x 2. Patient indicates he does not want to be tested, only wants treatment.   Confirmed nkda w pt.   Rocephin IM. Zithromax po.   Pt appears stable for d/c.             Final Clinical Impression(s) / ED Diagnoses Final diagnoses:  None    Rx / DC Orders ED Discharge Orders     None         Cathren Laine, MD 06/30/22 1242

## 2022-06-30 NOTE — ED Triage Notes (Signed)
Pt came in the ED with exposure to clymidia he states that his partner tested postive for it. Pt states hard to urinate and its painful to use the restroom. No discharge noted from patient. Pt states that this started about 4-5 weeks ago

## 2022-08-07 ENCOUNTER — Ambulatory Visit (HOSPITAL_COMMUNITY): Payer: Medicaid Other | Admitting: Psychiatry

## 2022-08-07 ENCOUNTER — Encounter (HOSPITAL_COMMUNITY): Payer: Self-pay | Admitting: Emergency Medicine

## 2022-08-07 ENCOUNTER — Emergency Department (HOSPITAL_COMMUNITY)
Admission: EM | Admit: 2022-08-07 | Discharge: 2022-08-07 | Disposition: A | Discharge status: Home / Self Care | Payer: Medicaid Other | Attending: Emergency Medicine | Admitting: Emergency Medicine

## 2022-08-07 ENCOUNTER — Other Ambulatory Visit: Payer: Self-pay

## 2022-08-07 DIAGNOSIS — Z79899 Other long term (current) drug therapy: Secondary | ICD-10-CM | POA: Diagnosis not present

## 2022-08-07 DIAGNOSIS — F518 Other sleep disorders not due to a substance or known physiological condition: Secondary | ICD-10-CM

## 2022-08-07 DIAGNOSIS — F515 Nightmare disorder: Secondary | ICD-10-CM | POA: Diagnosis present

## 2022-08-07 NOTE — Discharge Instructions (Addendum)
Take all of your medications as prescribed do not miss any doses.  Avoid using drugs and alcohol.  Come back if any medical concerns or go to the behavioral health urgent care if any new hallucinations, or thoughts of hurting yourself or others.

## 2022-08-07 NOTE — ED Provider Notes (Signed)
[place] EMERGENCY DEPARTMENT AT [facility] Provider Note   CSN: [id number] Arrival date & time: 08/07/22  0410     History  No chief complaint on file.   Jay Woods is a 22 y.o. male.  With PMH of polysubstance abuse, schizoaffective disorder bipolar type presenting after he " vaped too much" and having unusual dreams.  Patient came from his mother's house where he is currently staying.  He was vaping a lot and then woke up and had unusual dreams.  Reporting of being around a famous [place] actress whom he was married to and how everyone can have abnormal dreams.  He reports being on mood stabilizers and being compliant.  Denies any drug or alcohol use.  Denies any thoughts of suicidal ideation, homicidal ideation, auditory or visual hallucinations.  He does not want to speak with psychiatry.  He has no medical complaints, no chest pain, no shortness of breath, no falls or injuries  HPI     Home Medications Prior to Admission medications   Medication Sig Start Date End Date Taking? Authorizing Provider  amLODipine (NORVASC) 5 MG tablet Take 1 tablet (5 mg total) by mouth daily. 10/03/21 11/02/21  Lamar Sprinkles, MD  benztropine (COGENTIN) 0.5 MG tablet Take 1 tablet (0.5 mg total) by mouth 2 (two) times daily. 01/14/22 05/14/22  Bahraini, Sarah A  divalproex (DEPAKOTE ER) 500 MG 24 hr tablet Take 3 tablets (1,500 mg total) by mouth at bedtime. 01/14/22 05/14/22  [place]i, Sarah A  nicotine polacrilex (NICORETTE) 2 MG gum Take 1 each (2 mg total) by mouth every 4 (four) hours while awake. 11/27/21   Shanna Cisco, NP  risperiDONE (RISPERDAL) 1 MG tablet Take 1 tablet (1 mg total) by mouth daily. Start taking after you have been taking Risperdal 2 mg nightly for at least 4 days. 01/19/22 05/19/22  B[place]raini, Sarah A  risperiDONE (RISPERDAL) 2 MG tablet Take 1 tablet (2 mg total) by mouth at bedtime. 01/14/22 05/14/22  [place]i, Sarah A  traZODone (DESYREL) 100 MG  tablet Take 1 tablet (100 mg total) by mouth at bedtime as needed for sleep. 01/14/22 05/14/22  B[place]i, Sarah A      Allergies    Patient has no known allergies.    Review of Systems   Review of Systems  Physical Exam Updated Vital Signs BP (!) 138/96 (BP Location: Left Arm)   Pulse 80   Temp (!) 97.5 F (36.4 C) (Oral)   Resp 18   Ht 5\' 9"  (1.753 m)   Wt 70 kg   SpO2 100%   BMI 22.79 kg/m  Physical Exam Constitutional: Alert and oriented. GCS 15, well developed nontoxic Eyes: Conjunctivae are normal.PERRL ENT      Head: Normocephalic and atraumatic. Cardiovascular: S1, S2,  Normal and symmetric distal pulses are present in all extremities.Warm and well perfused. Respiratory: Normal respiratory effort. Breath sounds are normal. O2 sat 100 on RA Gastrointestinal: nondistended Musculoskeletal: Normal range of motion in all extremities Neurologic: Normal speech and language. Moving extremities x4. Sensation grossly intact. Steady gait. No gross focal neurologic deficits are appreciated. Skin: Skin is warm, dry and intact. No rash noted. Psychiatric: odd affect, no pressured speech, calm  ED Results / Procedures / Treatments   Labs (all labs ordered are listed, but only abnormal results are displayed) Labs Reviewed - No data to display  EKG None  Radiology No results found.  Procedures Procedures    Medications Ordered in ED Medications - No  data to display  ED Course/ Medical Decision Making/ A&P                             Medical Decision Making  Izeyah Peal is a 22 y.o. male.  With PMH of polysubstance abuse, schizoaffective disorder bipolar type presenting after he " vaped too much" and having unusual dreams.   Patient has odd affect but no pressured speech, no delusions, no hallucinations, no SI or HI, low concern for active severe psychosis or manic episode currently.  Patient does not want psychiatric evaluation.  He is neurologically intact  on exam noted HDS with no medical complaints..  Observed for 2 hours in the ER with no psychiatric outburst or complaints.  Patient advised patient to take all antipsychotics as written and [facility] information.        Final Clinical Impression(s) / ED Diagnoses Final diagnoses:  Abnormal dreams    Rx / DC Orders ED Discharge Orders     None         Mardene Sayer, MD 08/07/22 1734

## 2022-08-07 NOTE — ED Triage Notes (Signed)
Patient reports he's here because he "vaped too much," denies complaints from that.  While in triage, patient talking about his dream he had before he came here.

## 2023-03-22 ENCOUNTER — Ambulatory Visit
Admission: EM | Admit: 2023-03-22 | Discharge: 2023-03-22 | Disposition: A | Discharge status: Home / Self Care | Payer: MEDICAID | Attending: Internal Medicine | Admitting: Internal Medicine

## 2023-03-22 ENCOUNTER — Encounter: Payer: Self-pay | Admitting: Emergency Medicine

## 2023-03-22 DIAGNOSIS — J101 Influenza due to other identified influenza virus with other respiratory manifestations: Secondary | ICD-10-CM | POA: Diagnosis not present

## 2023-03-22 LAB — POC COVID19/FLU A&B COMBO
Covid Antigen, POC: NEGATIVE
Influenza A Antigen, POC: POSITIVE — AB
Influenza B Antigen, POC: NEGATIVE

## 2023-03-22 MED ORDER — PREDNISONE 20 MG PO TABS: 40.0000 mg | ORAL_TABLET | Freq: Every day | ORAL | 0 refills | Status: AC

## 2023-03-22 MED ORDER — PROMETHAZINE-DM 6.25-15 MG/5ML PO SYRP: 5.0000 mL | ORAL_SOLUTION | Freq: Three times a day (TID) | ORAL | 0 refills | Status: DC | PRN

## 2023-03-22 MED ORDER — ACETAMINOPHEN 325 MG PO TABS
650.0000 mg | ORAL_TABLET | Freq: Once | ORAL | Status: AC
  Administered 2023-03-22: 650 mg via ORAL

## 2023-03-22 MED ORDER — OSELTAMIVIR PHOSPHATE 75 MG PO CAPS: 75.0000 mg | ORAL_CAPSULE | Freq: Two times a day (BID) | ORAL | 0 refills | Status: DC

## 2023-03-22 NOTE — Discharge Instructions (Addendum)
Flu a is positive.  Flu B and COVID are negative.  This is a virus and does not require antibiotics.  We can treat with the following: Tamiflu 75 mg twice daily for 5 days.  This medication helps reduce the symptoms and number of days that you have the flu. Promethazine DM 5 mL every 8 hours as needed for cough.  Use caution as this medication can cause drowsiness. Prednisone 40 mg (2 tablets) daily for 5 days. Take this in the morning.  This is a steroid to help with inflammation and pain Rest and stay hydrated Return to urgent care or PCP if symptoms worsen or fail to resolve.

## 2023-03-22 NOTE — ED Triage Notes (Signed)
Patient c/o body aches, exposed to influenza from his son.  Patient started feeling bad yesterday.  Body chills, cough, sore throat.  Patient has taken any medications.

## 2023-03-22 NOTE — ED Provider Notes (Signed)
[facility]    CSN: [id number] Arrival date & time: 03/22/23  1733      History   Chief Complaint Chief Complaint  Patient presents with   Generalized Body Aches    HPI Jay Woods is a 23 y.o. male.   23 year old male who presents to urgent care with complaints of bodyaches, headaches, sore throat, chills and fever.  This started yesterday.  His son has the flu.  He has had a poor appetite since yesterday.  He denies shortness of breath, chest pain, nausea, vomiting, diarrhea.  He has not taken any medication except for Tylenol that was given to him here.     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 No pertinent surgical history.     Home Medications    Prior to Admission medications   Medication Sig Start Date End Date Taking? Authorizing Provider  oseltamivir (TAMIFLU) 75 MG capsule Take 1 capsule (75 mg total) by mouth every 12 (twelve) hours. 03/22/23  Yes Trejan Buda A, PA-C  predniSONE (DELTASONE) 20 MG tablet Take 2 tablets (40 mg total) by mouth daily with breakfast for 5 days. 03/22/23 03/27/23 Yes Ely Ballen A, PA-C  promethazine-dextromethorphan (PROMETHAZINE-DM) 6.25-15 MG/5ML syrup Take 5 mLs by mouth every 8 (eight) hours as needed for cough. 03/22/23  Yes Adanya Sosinski A, PA-C  amLODipine (NORVASC) 5 MG tablet Take 1 tablet (5 mg total) by mouth daily. 10/03/21  11/02/21  Lamar Sprinkles, MD  benztropine (COGENTIN) 0.5 MG tablet Take 1 tablet (0.5 mg total) by mouth 2 (two) times daily. 01/14/22 05/14/22  Bahraini, Sarah A  divalproex (DEPAKOTE ER) 500 MG 24 hr tablet Take 3 tablets (1,500 mg total) by mouth at bedtime. 01/14/22 05/14/22  [place]i, Sarah A  nicotine polacrilex (NICORETTE) 2 MG gum Take 1 each (2 mg total) by mouth every 4 (four) hours while awake. 11/27/21   Shanna Cisco, NP  risperiDONE (RISPERDAL) 1 MG tablet Take 1 tablet (1 mg total) by mouth daily. Start taking after you have been taking Risperdal 2 mg nightly for at least 4 days. 01/19/22 05/19/22  [place]i, Sarah A  risperiDONE (RISPERDAL) 2 MG tablet Take 1 tablet (2 mg total) by mouth at bedtime. 01/14/22 05/14/22  [place]i, Sarah A  traZODone (DESYREL) 100 MG tablet Take 1 tablet (100 mg total) by mouth at bedtime as needed for sleep. 01/14/22 05/14/22  Daine Gip    Family History Family History  Problem Relation Age of Onset   Healthy Mother    Healthy Father     Social History Social History   Tobacco Use   Smoking status: Former    Current packs/day: 0.25    Average packs/day: 0.3 packs/day for 5.0 years (1.3 ttl pk-yrs)    Types: Cigarettes   Smokeless tobacco: Former  Building services engineer status: Every Day   Substances: CBD  Substance Use Topics   Alcohol use: Not Currently  Alcohol/week: 1.0 standard drink of alcohol    Types: 1 Cans of beer per week    Comment: last use 09/30/21   Drug use: Not Currently    Comment: Hx of cocaine, marijuana, k2, Xanax, and Molly on and off for the past 5 years     Allergies   Patient has no known allergies.   Review of Systems Review of Systems  Constitutional:  Positive for chills and fever.  HENT:  Positive for sore throat. Negative for ear pain.   Eyes:  Negative for pain and visual disturbance.  Respiratory:  Positive for cough. Negative for shortness of breath.   Cardiovascular:  Negative for chest  pain and palpitations.  Gastrointestinal:  Negative for abdominal pain and vomiting.  Genitourinary:  Negative for dysuria and hematuria.  Musculoskeletal:  Negative for arthralgias and back pain.       Generalized body aches  Skin:  Negative for color change and rash.  Neurological:  Positive for headaches. Negative for seizures and syncope.  All other systems reviewed and are negative.    Physical Exam Triage Vital Signs ED Triage Vitals  Encounter Vitals Group     BP 03/22/23 1808 (!) 146/92     Systolic BP Percentile --      Diastolic BP Percentile --      Pulse Rate 03/22/23 1808 (!) 108     Resp 03/22/23 1808 18     Temp 03/22/23 1808 (!) 100.6 F (38.1 C)     Temp Source 03/22/23 1808 Oral     SpO2 03/22/23 1808 99 %     Weight 03/22/23 1810 150 lb (68 kg)     Height 03/22/23 1810 5\' 9"  (1.753 m)     Head Circumference --      Peak Flow --      Pain Score 03/22/23 1810 10     Pain Loc --      Pain Education --      Exclude from Growth Chart --    No data found.  Updated Vital Signs BP (!) 146/92 (BP Location: Left Arm)   Pulse (!) 108   Temp (!) 100.6 F (38.1 C) (Oral)   Resp 18   Ht 5\' 9"  (1.753 m)   Wt 150 lb (68 kg)   SpO2 99%   BMI 22.15 kg/m   Visual Acuity Right Eye Distance:   Left Eye Distance:   Bilateral Distance:    Right Eye Near:   Left Eye Near:    Bilateral Near:     Physical Exam Vitals and nursing note reviewed.  Constitutional:      General: He is not in acute distress.    Appearance: He is well-developed.  HENT:     Head: Normocephalic and atraumatic.  Eyes:     Conjunctiva/sclera: Conjunctivae normal.  Cardiovascular:     Rate and Rhythm: Normal rate and regular rhythm.     Heart sounds: No murmur heard. Pulmonary:     Effort: Pulmonary effort is normal. No respiratory distress.     Breath sounds: Normal breath sounds.  Abdominal:     Palpations: Abdomen is soft.     Tenderness: There is no abdominal tenderness.   Musculoskeletal:        General: No swelling.     Cervical back: Neck supple.  Skin:    General: Skin is warm and dry.     Capillary Refill: Capillary refill takes less than 2 seconds.  Neurological:  Mental Status: He is alert.  Psychiatric:        Mood and Affect: Mood normal.      UC Treatments / Results  Labs (all labs ordered are listed, but only abnormal results are displayed) Labs Reviewed  POC COVID19/FLU A&B COMBO - Abnormal; Notable for the following components:      Result Value   Influenza A Antigen, POC Positive (*)    All other components within normal limits    EKG   Radiology No results found.  Procedures Procedures (including critical care time)  Medications Ordered in UC Medications  acetaminophen (TYLENOL) tablet 650 mg (650 mg Oral Given 03/22/23 1817)    Initial Impression / Assessment and Plan / UC Course  I have reviewed the triage vital signs and the nursing notes.  Pertinent labs & imaging results that were available during my care of the patient were reviewed by me and considered in my medical decision making (see chart for details).     Influenza A   Flu a is positive.  Flu B and COVID are negative.  This is a virus and does not require antibiotics.  We can treat with the following: Tamiflu 75 mg twice daily for 5 days.  This medication helps reduce the symptoms and number of days that you have the flu. Promethazine DM 5 mL every 8 hours as needed for cough.  Use caution as this medication can cause drowsiness. Prednisone 40 mg (2 tablets) daily for 5 days. Take this in the morning.  This is a steroid to help with inflammation and pain Rest and stay hydrated Return to urgent care or PCP if symptoms worsen or fail to resolve.    Final Clinical Impressions(s) / UC Diagnoses   Final diagnoses:  Influenza A     Discharge Instructions      Flu a is positive.  Flu B and COVID are negative.  This is a virus and does not require  antibiotics.  We can treat with the following: Tamiflu 75 mg twice daily for 5 days.  This medication helps reduce the symptoms and number of days that you have the flu. Promethazine DM 5 mL every 8 hours as needed for cough.  Use caution as this medication can cause drowsiness. Prednisone 40 mg (2 tablets) daily for 5 days. Take this in the morning.  This is a steroid to help with inflammation and pain Rest and stay hydrated Return to urgent care or PCP if symptoms worsen or fail to resolve.     ED Prescriptions     Medication Sig Dispense Auth. Provider   oseltamivir (TAMIFLU) 75 MG capsule Take 1 capsule (75 mg total) by mouth every 12 (twelve) hours. 10 capsule Landis Martins, PA-C   promethazine-dextromethorphan (PROMETHAZINE-DM) 6.25-15 MG/5ML syrup Take 5 mLs by mouth every 8 (eight) hours as needed for cough. 180 mL Wilfrid Hyser A, PA-C   predniSONE (DELTASONE) 20 MG tablet Take 2 tablets (40 mg total) by mouth daily with breakfast for 5 days. 10 tablet Landis Martins, [place]      PDMP not reviewed this encounter.   Landis Martins, [place] 03/22/23 [phone]

## 2023-07-10 ENCOUNTER — Emergency Department (HOSPITAL_COMMUNITY)
Admission: EM | Admit: 2023-07-10 | Discharge: 2023-07-10 | Disposition: A | Discharge status: Other Acute Inpatient Hospital | Payer: MEDICAID | Attending: Emergency Medicine | Admitting: Emergency Medicine

## 2023-07-10 ENCOUNTER — Other Ambulatory Visit: Payer: Self-pay

## 2023-07-10 ENCOUNTER — Inpatient Hospital Stay (HOSPITAL_COMMUNITY)
Admission: AD | Admit: 2023-07-10 | Discharge: 2023-07-25 | DRG: 885 | Disposition: A | Discharge status: Home / Self Care | Payer: MEDICAID | Source: Intra-hospital | Attending: Psychiatry | Admitting: Psychiatry

## 2023-07-10 ENCOUNTER — Encounter (HOSPITAL_COMMUNITY): Payer: Self-pay | Admitting: Emergency Medicine

## 2023-07-10 ENCOUNTER — Encounter (HOSPITAL_COMMUNITY): Payer: Self-pay | Admitting: Psychiatry

## 2023-07-10 DIAGNOSIS — F17210 Nicotine dependence, cigarettes, uncomplicated: Secondary | ICD-10-CM | POA: Diagnosis present

## 2023-07-10 DIAGNOSIS — Z5941 Food insecurity: Secondary | ICD-10-CM

## 2023-07-10 DIAGNOSIS — F17290 Nicotine dependence, other tobacco product, uncomplicated: Secondary | ICD-10-CM | POA: Diagnosis present

## 2023-07-10 DIAGNOSIS — F411 Generalized anxiety disorder: Secondary | ICD-10-CM | POA: Diagnosis present

## 2023-07-10 DIAGNOSIS — F250 Schizoaffective disorder, bipolar type: Secondary | ICD-10-CM | POA: Diagnosis present

## 2023-07-10 DIAGNOSIS — Z5982 Transportation insecurity: Secondary | ICD-10-CM | POA: Diagnosis not present

## 2023-07-10 DIAGNOSIS — F1910 Other psychoactive substance abuse, uncomplicated: Secondary | ICD-10-CM | POA: Diagnosis present

## 2023-07-10 DIAGNOSIS — Z5986 Financial insecurity: Secondary | ICD-10-CM | POA: Diagnosis not present

## 2023-07-10 DIAGNOSIS — Z5948 Other specified lack of adequate food: Secondary | ICD-10-CM

## 2023-07-10 DIAGNOSIS — Z87891 Personal history of nicotine dependence: Secondary | ICD-10-CM | POA: Insufficient documentation

## 2023-07-10 DIAGNOSIS — Z79899 Other long term (current) drug therapy: Secondary | ICD-10-CM | POA: Diagnosis not present

## 2023-07-10 DIAGNOSIS — Z560 Unemployment, unspecified: Secondary | ICD-10-CM

## 2023-07-10 DIAGNOSIS — F29 Unspecified psychosis not due to a substance or known physiological condition: Secondary | ICD-10-CM | POA: Diagnosis not present

## 2023-07-10 DIAGNOSIS — R456 Violent behavior: Secondary | ICD-10-CM | POA: Diagnosis present

## 2023-07-10 LAB — CBC
HCT: 45.7 % (ref 39.0–52.0)
Hemoglobin: 15.2 g/dL (ref 13.0–17.0)
MCH: 30.3 pg (ref 26.0–34.0)
MCHC: 33.3 g/dL (ref 30.0–36.0)
MCV: 91 fL (ref 80.0–100.0)
Platelets: 299 10*3/uL (ref 150–400)
RBC: 5.02 MIL/uL (ref 4.22–5.81)
RDW: 13.1 % (ref 11.5–15.5)
WBC: 7.8 10*3/uL (ref 4.0–10.5)
nRBC: 0 % (ref 0.0–0.2)

## 2023-07-10 LAB — COMPREHENSIVE METABOLIC PANEL WITH GFR
ALT: 22 U/L (ref 0–44)
AST: 23 U/L (ref 15–41)
Albumin: 5 g/dL (ref 3.5–5.0)
Alkaline Phosphatase: 51 U/L (ref 38–126)
Anion gap: 8 (ref 5–15)
BUN: 15 mg/dL (ref 6–20)
CO2: 24 mmol/L (ref 22–32)
Calcium: 9.6 mg/dL (ref 8.9–10.3)
Chloride: 106 mmol/L (ref 98–111)
Creatinine, Ser: 0.92 mg/dL (ref 0.61–1.24)
GFR, Estimated: >60 mL/min (ref >60)
Glucose, Bld: 113 mg/dL — ABNORMAL HIGH (ref 70–99)
Potassium: 3.4 mmol/L — ABNORMAL LOW (ref 3.5–5.1)
Sodium: 138 mmol/L (ref 135–145)
Total Bilirubin: 1.2 mg/dL (ref 0.0–1.2)
Total Protein: 8.7 g/dL — ABNORMAL HIGH (ref 6.5–8.1)

## 2023-07-10 LAB — ETHANOL: Alcohol, Ethyl (B): <15 mg/dL (ref <15)

## 2023-07-10 MED ORDER — ENSURE ENLIVE PO LIQD
237.0000 mL | Freq: Two times a day (BID) | ORAL | Status: DC
  Administered 2023-07-11 – 2023-07-24 (×25): 237 mL via ORAL
  Filled 2023-07-10 (×23): qty 237

## 2023-07-10 MED ORDER — LORAZEPAM 2 MG/ML IJ SOLN
2.0000 mg | Freq: Three times a day (TID) | INTRAMUSCULAR | Status: DC | PRN
Start: 2023-07-10 — End: 2023-07-25

## 2023-07-10 MED ORDER — HALOPERIDOL LACTATE 5 MG/ML IJ SOLN
10.0000 mg | Freq: Three times a day (TID) | INTRAMUSCULAR | Status: DC | PRN
Start: 2023-07-10 — End: 2023-07-25

## 2023-07-10 MED ORDER — DIPHENHYDRAMINE HCL 50 MG/ML IJ SOLN: 50.0000 mg | Freq: Three times a day (TID) | INTRAMUSCULAR | Status: DC | PRN

## 2023-07-10 MED ORDER — DIVALPROEX SODIUM ER 500 MG PO TB24
500.0000 mg | ORAL_TABLET | Freq: Every day | ORAL | Status: DC
  Administered 2023-07-11 – 2023-07-14 (×4): 500 mg via ORAL
  Filled 2023-07-10 (×5): qty 1

## 2023-07-10 MED ORDER — HYDROXYZINE HCL 25 MG PO TABS: 25.0000 mg | ORAL_TABLET | Freq: Three times a day (TID) | ORAL | Status: DC | PRN

## 2023-07-10 MED ORDER — HALOPERIDOL LACTATE 5 MG/ML IJ SOLN: 5.0000 mg | Freq: Four times a day (QID) | INTRAMUSCULAR | Status: DC | PRN

## 2023-07-10 MED ORDER — NICOTINE POLACRILEX 2 MG MT GUM
2.0000 mg | CHEWING_GUM | OROMUCOSAL | Status: DC
  Administered 2023-07-10 – 2023-07-25 (×37): 2 mg via ORAL
  Filled 2023-07-10 (×35): qty 1

## 2023-07-10 MED ORDER — LORAZEPAM 2 MG/ML IJ SOLN: 2.0000 mg | Freq: Three times a day (TID) | INTRAMUSCULAR | Status: DC | PRN

## 2023-07-10 MED ORDER — ACETAMINOPHEN 325 MG PO TABS
650.0000 mg | ORAL_TABLET | Freq: Four times a day (QID) | ORAL | Status: DC | PRN
  Administered 2023-07-11 – 2023-07-20 (×2): 650 mg via ORAL
  Filled 2023-07-10 (×2): qty 2

## 2023-07-10 MED ORDER — DIPHENHYDRAMINE HCL 25 MG PO CAPS
50.0000 mg | ORAL_CAPSULE | Freq: Three times a day (TID) | ORAL | Status: DC | PRN
  Administered 2023-07-11: 50 mg via ORAL
  Filled 2023-07-10: qty 2

## 2023-07-10 MED ORDER — HALOPERIDOL 5 MG PO TABS
5.0000 mg | ORAL_TABLET | Freq: Three times a day (TID) | ORAL | Status: DC | PRN
  Administered 2023-07-11: 5 mg via ORAL
  Filled 2023-07-10: qty 1

## 2023-07-10 MED ORDER — MAGNESIUM HYDROXIDE 400 MG/5ML PO SUSP: 30.0000 mL | Freq: Every day | ORAL | Status: DC | PRN

## 2023-07-10 MED ORDER — ALUM & MAG HYDROXIDE-SIMETH 200-200-20 MG/5ML PO SUSP: 30.0000 mL | ORAL | Status: DC | PRN

## 2023-07-10 MED ORDER — LORAZEPAM 2 MG/ML IJ SOLN: 2.0000 mg | Freq: Four times a day (QID) | INTRAMUSCULAR | Status: DC | PRN

## 2023-07-10 MED ORDER — HALOPERIDOL LACTATE 5 MG/ML IJ SOLN
5.0000 mg | Freq: Three times a day (TID) | INTRAMUSCULAR | Status: DC | PRN
Start: 2023-07-10 — End: 2023-07-25

## 2023-07-10 MED ORDER — RISPERIDONE 0.5 MG PO TBDP
0.5000 mg | ORAL_TABLET | Freq: Two times a day (BID) | ORAL | Status: DC
  Administered 2023-07-10 – 2023-07-11 (×2): 0.5 mg via ORAL
  Filled 2023-07-10 (×2): qty 1

## 2023-07-10 MED ORDER — NICOTINE POLACRILEX 2 MG MT GUM: 2.0000 mg | CHEWING_GUM | OROMUCOSAL | Status: DC

## 2023-07-10 MED ORDER — RISPERIDONE 0.5 MG PO TBDP
0.5000 mg | ORAL_TABLET | Freq: Two times a day (BID) | ORAL | Status: DC
Start: 2023-07-10 — End: 2023-07-10
  Administered 2023-07-10: 0.5 mg via ORAL
  Filled 2023-07-10: qty 1

## 2023-07-10 MED ORDER — NICOTINE 7 MG/24HR TD PT24
7.0000 mg | MEDICATED_PATCH | Freq: Every day | TRANSDERMAL | Status: DC
  Administered 2023-07-10: 7 mg via TRANSDERMAL
  Filled 2023-07-10: qty 1

## 2023-07-10 MED ORDER — DIVALPROEX SODIUM ER 500 MG PO TB24
500.0000 mg | ORAL_TABLET | Freq: Every day | ORAL | Status: DC
  Administered 2023-07-10: 500 mg via ORAL
  Filled 2023-07-10: qty 1

## 2023-07-10 MED ORDER — HYDROXYZINE HCL 25 MG PO TABS
25.0000 mg | ORAL_TABLET | Freq: Three times a day (TID) | ORAL | Status: DC | PRN
Start: 2023-07-10 — End: 2023-07-25
  Administered 2023-07-11 – 2023-07-15 (×6): 25 mg via ORAL
  Filled 2023-07-10 (×7): qty 1

## 2023-07-10 NOTE — ED Provider Notes (Signed)
 [place] EMERGENCY DEPARTMENT AT [facility] Provider Note   CSN: [id number] Arrival date & time: 07/10/23  0231     History  Chief Complaint  Patient presents with   IVC   Psychiatric Evaluation    Jay Woods is a 23 y.o. male.  Patient presents to the emergency department accompanied by law enforcement under involuntary commitment.  Papers were taken out by the patient's mother after she reports that he has had erratic behavior with aggressive episodes against family.  Patient with history of schizoaffective disorder, bipolar type, substances mood disorder, intermittent explosive disorder.  Patient reportedly not taking medications for the past few weeks or more.  Patient denies SI/HI.  He does endorse visual hallucinations but does not elaborate on what he sees.  HPI     Home Medications Prior to Admission medications   Medication Sig Start Date End Date Taking? Authorizing Provider  amLODipine  (NORVASC ) 5 MG tablet Take 1 tablet (5 mg total) by mouth daily. 10/03/21 11/02/21  Shery Done, MD  benztropine  (COGENTIN ) 0.5 MG tablet Take 1 tablet (0.5 mg total) by mouth 2 (two) times daily. 01/14/22 05/14/22  Bahraini, Sarah A  divalproex  (DEPAKOTE  ER) 500 MG 24 hr tablet Take 3 tablets (1,500 mg total) by mouth at bedtime. 01/14/22 05/14/22  [place]i, Sarah A  nicotine  polacrilex (NICORETTE ) 2 MG gum Take 1 each (2 mg total) by mouth every 4 (four) hours while awake. 11/27/21   Arlyne Bering, NP  oseltamivir  (TAMIFLU ) 75 MG capsule Take 1 capsule (75 mg total) by mouth every 12 (twelve) hours. 03/22/23   White, Elizabeth A, PA-C  promethazine -dextromethorphan (PROMETHAZINE -DM) 6.25-15 MG/5ML syrup Take 5 mLs by mouth every 8 (eight) hours as needed for cough. 03/22/23   White, Elizabeth A, PA-C  risperiDONE  (RISPERDAL ) 1 MG tablet Take 1 tablet (1 mg total) by mouth daily. Start taking after you have been taking Risperdal  2 mg nightly for at least 4 days.  01/19/22 05/19/22  B[place]raini, Sarah A  risperiDONE  (RISPERDAL ) 2 MG tablet Take 1 tablet (2 mg total) by mouth at bedtime. 01/14/22 05/14/22  [place]i, Sarah A  traZODone  (DESYREL ) 100 MG tablet Take 1 tablet (100 mg total) by mouth at bedtime as needed for sleep. 01/14/22 05/14/22  [place]i, Sarah A      Allergies    Patient has no known allergies.    Review of Systems   Review of Systems  Physical Exam Updated Vital Signs BP (!) 159/110   Pulse 87   Temp 98 F (36.7 C)   Resp 16   Ht 5\' 9"  (1.753 m)   Wt 68 kg   SpO2 99%   BMI 22.14 kg/m  Physical Exam Vitals and nursing note reviewed.  Constitutional:      General: He is not in acute distress.    Appearance: He is well-developed.  HENT:     Head: Normocephalic and atraumatic.  Eyes:     Conjunctiva/sclera: Conjunctivae normal.  Cardiovascular:     Rate and Rhythm: Normal rate and regular rhythm.     Heart sounds: No murmur heard. Pulmonary:     Effort: Pulmonary effort is normal.     Breath sounds: Normal breath sounds.  Musculoskeletal:        General: No swelling.     Cervical back: Neck supple.  Skin:    General: Skin is warm and dry.     Capillary Refill: Capillary refill takes less than 2 seconds.  Neurological:  Mental Status: He is alert.  Psychiatric:     Comments: Patient with flight of ideas, tangential speech     ED Results / Procedures / Treatments   Labs (all labs ordered are listed, but only abnormal results are displayed) Labs Reviewed  COMPREHENSIVE METABOLIC PANEL WITH GFR - Abnormal; Notable for the following components:      Result Value   Potassium 3.4 (*)    Glucose, Bld 113 (*)    Total Protein 8.7 (*)    All other components within normal limits  ETHANOL  CBC  RAPID URINE DRUG SCREEN, HOSP PERFORMED    EKG None  Radiology No results found.  Procedures Procedures    Medications Ordered in ED Medications - No data to display  ED Course/ Medical Decision Making/  A&P                                 Medical Decision Making Amount and/or Complexity of Data Reviewed Labs: ordered.   This patient presents to the ED for concern of psychosis, this involves an extensive number of treatment options, and is a complaint that carries with it a high risk of complications and morbidity.    Co morbidities that complicate the patient evaluation  Schizoaffective disorder, bipolar type, substance-induced mood disorder   Additional history obtained:  Additional history obtained from law enforcement   Lab Tests:  I Ordered, and personally interpreted labs.  The pertinent results include: Unremarkable ethanol, CBC, CMP  Consultations Obtained:  I requested consultation with the psychiatry team  Social Determinants of Health:  Patient is a former smoker   Test / Admission - Considered:  Patient with acute psychosis would benefit from evaluation by psychiatry for further recommendations on treatment. Patient is here under IVC         Final Clinical Impression(s) / ED Diagnoses Final diagnoses:  Psychosis, unspecified psychosis type [facility])    Rx / DC Orders ED Discharge Orders     None         Delories Fetter 07/10/23 0981    Lindle Rhea, MD 07/10/23 [phone]

## 2023-07-10 NOTE — Plan of Care (Signed)
   Problem: Education: Goal: Knowledge of [facility] Education information/materials will improve Outcome: Progressing Goal: Emotional status will improve Outcome: Progressing Goal: Mental status will improve Outcome: Progressing

## 2023-07-10 NOTE — ED Triage Notes (Signed)
 Patient BIB [facility] with [facility]. Per report mother called [facility] because patient was not in the right state of mind for few days and mother is worried about the safety of her son and grandson.  Per report patient not taking his medication at home. Per report patient was driving around the city with his son in the back seat. Patient  denies SI/HI.

## 2023-07-10 NOTE — ED Notes (Signed)
Report given to [facility].

## 2023-07-10 NOTE — Consult Note (Signed)
 [facility] Psychiatric Consult Initial  Patient Name: .Jay Woods  MRN: [id number]  DOB: 07/11/2000  Consult Order details:  Orders (From admission, onward)     Start     Ordered   07/10/23 0434  CONSULT TO CALL ACT TEAM       Ordering Provider: Elisa Guest, PA-C  Provider:  (Not yet assigned)  Question:  Reason for Consult?  Answer:  Psych consult   07/10/23 0433             Mode of Visit: In person    Psychiatry Consult Evaluation  Service Date: Jul 10, 2023 LOS:  LOS: 0 days  Chief Complaint schizoaffective disorder, bipolar type  Primary Psychiatric Diagnoses  Schizoaffective disorder, bipolar type 2.   Polysubstance abuse 3.   Generalized anxiety disorder   4.   Nicotine  dependence  Assessment  Jay Woods is a 23 y.o. male admitted: Presented to the ED on 07/10/2023  2:35 AM for decompensating mood disorder. He carries the psychiatric diagnoses of schizoaffective disorder, bipolar type, anxiety and has a past medical history of urethritis.   His current presentation of elevated mood, decreased need for sleep, rapid speech, and reckless behavior is most consistent with  decompensating mood disorder. He meets criteria for inpatient psychiatric admission based on current symptoms.  Current outpatient psychotropic medications include Depakote , and Risperdal  and historically he has had a beneficial response to these medications. He was non compliant with medications prior to admission as evidenced by patient report. On initial examination, patient is hyperverbal, pleasant, and cooperative. Please see plan below for detailed recommendations.   Diagnoses:  Active Hospital problems: Principal Problem:   Schizoaffective disorder, bipolar type (HCC) Active Problems:   Polysubstance abuse (HCC)    Plan   ## Psychiatric Medication Recommendations:  Start Depakote  500 mg daily due to mood Start Risperdal  0.5 mg BID due to mood Give Nicotine  Patch 7  mg; give nicotine  2 mg q 4 while awake for nicotine  dependence    ## Medical Decision Making Capacity: Not specifically addressed in this encounter  ## Further Work-up:  -- No further workup needed at this time EKG, U/A, or UDS -- most recent EKG on 06/04/2022 had QtC of 386 -- Pertinent labwork reviewed earlier this admission includes: CBC, CMP, UDS, EKG   ## Disposition:-- We recommend inpatient psychiatric hospitalization after medical hospitalization. Patient has been involuntarily committed on 07/09/23.   ## Behavioral / Environmental: -To minimize splitting of staff, assign one staff person to communicate all information from the team when feasible. or Utilize compassion and acknowledge the patient's experiences while setting clear and realistic expectations for care.    ## Safety and Observation Level:  - Based on my clinical evaluation, I estimate the patient to be at moderate risk of self harm in the current setting. - At this time, we recommend  routine. This decision is based on my review of the chart including patient's history and current presentation, interview of the patient, mental status examination, and consideration of suicide risk including evaluating suicidal ideation, plan, intent, suicidal or self-harm behaviors, risk factors, and protective factors. This judgment is based on our ability to directly address suicide risk, implement suicide prevention strategies, and develop a safety plan while the patient is in the clinical setting. Please contact our team if there is a concern that risk level has changed.  CSSR Risk Category:C-SSRS RISK CATEGORY: No Risk  Suicide Risk Assessment: Patient has following modifiable risk factors for suicide: Recklessness,  medication noncompliance, which we are addressing by recommending inpatient psychiatric admission. Patient has following non-modifiable or demographic risk factors for suicide: male gender and psychiatric  hospitalization Patient has the following protective factors against suicide: Supportive family and Minor children in the home  Thank you for this consult request. Recommendations have been communicated to the primary team.  We will recommend the patient be admitted to an inpatient psychiatric unit at this time.   [name], PMHNP       History of Present Illness  Relevant Aspects of Hospital ED Course:  Admitted on 07/10/2023 for decompensating mood disorder, noncompliance with medications.  Patient Report:  Jay Woods, 23 y.o., male patient seen face to face by this provider, consulted with Dr. Deborah Falling; and chart reviewed on 07/10/23.  On evaluation Jay Woods reports that he got into an argument with his girlfriend who is also the mother of his child.  He states that he wanted his girlfriend and toddler son to come and live with him, his mother and sister, states because she lives in [place], and he states that its too far for him to see his son.  He states that she lives in [place] with friends, and she does not want to move in with him.  He states he got upset, and took his son and put him in the car and drove him from [place] to [place] to his mother's house.  He said that one of his friends took their child from the baby mother, so he decided to try it.  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  He states he is currently trying to get his GED as he dropped out of high school in the 11th grade.  He currently denies SI/HI/AVH.  Patient states he has been too many inpatient psychiatric facilities, states he has a diagnosis schizophrenia and bipolar.  He states he has not been compliant with his medications in about 2 years, states he does not feel like he needs them.  He does endorse vaping nicotine , unsure of the amount, states that he drinks alcohol occasionally, denies using any  illicit substances.  UDS is needed, BAL less than 15.  Patient states that he gets about 6 hours of sleep at night, but says his girlfriend says he only gets about 3-4, he  does endorse having some restless night.  He states that he eats about 1-2 meals a day, but does snack a lot.  Face-to-face observation of patient, patient is alert and oriented x 4, speech is clear, without word salad.  Mood is labile affect congruent with mood.  Patient denies SI, HI, AVH, or paranoia at this time patient denies he is seeing a psychiatrist or therapist at this time,  has pressured/rapid speech, and behavior.  Patient's behavior is bizarre at times, as he smiles inappropriately, when this provider asked him what he was smiling about, he states he did not know.  Patient behavior appears to be younger than stated age, patient has a lot of childlike behaviors. Patient denies suicidal/self-harm/homicidal ideation, psychosis, and paranoia.  Patient states he has not having any paranoid symptoms, but does appear to be paranoid,  As he is constantly looking around the room.  This provider informed him that I will be starting him back on his medications that was started at [facility], Depakote  and Risperdal , patient is in agreement.  Per Chart  Review: Pt has multiple inpatient hospitalization with the most recent discharge from [facility] on 08/03/2022. Patient was IVCd at [facility] in 08/10/2022, "due to history of psychosis, bipolar disorder, and depression. Pt has been noncompliant with his medications and has reported flushing them down the toilet. Pt's mother reported that the pt kicked down her front door because he heard voices. There was no one inside the home at the time. IVC reported pt hearing voices telling him to kill himself."    Psych ROS:  Depression: Denies Anxiety: Positive Mania (lifetime and current): Positive Psychosis: (lifetime and current): Denies  Collateral information:  Attempts to contact  patient's mother Mayer Speaker, no answer left the HIPAA compliant voicemail on 07/10/2023.  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 Hx: Denies Living Situation: Lives at home with mother and sister Spiritual Hx: Yes Access to weapons/lethal means: Denies  Substance History Alcohol: Yes Type of alcohol liquor Last Drink unknown Number of drinks per day occasionally History of alcohol withdrawal seizures denies History of DT's denies Tobacco: Yes Illicit drugs: Denies, past history of marijuana abuse, UDS is needed Prescription drug abuse: Denies Rehab hx: Denies  Exam Findings  Physical Exam:  Vital Signs:  Temp:  [98 F (36.7 C)] 98 F (36.7 C) (05/17 0245) Pulse Rate:  [87] 87 (05/17 0245) Resp:  [16] 16 (05/17 0245) BP: (159)/(110) 159/110 (05/17 0245) SpO2:  [99 %] 99 % (05/17 0245) Weight:  [68 kg] 68 kg (05/17 0245) Blood pressure (!) 159/110, pulse 87, temperature 98 F (36.7 C), resp. rate 16, height 5\' 9"  (1.753 m), weight 68 kg, SpO2 99%. Body mass index is 22.14 kg/m.  Physical Exam  Mental Status Exam: General Appearance: Casual  Orientation:  Full (Time, Place, and Person)  Memory:  Immediate;   Fair Remote;   Poor  Concentration:  Concentration: Poor and Attention Span: Fair  Recall:  Fair  Attention  Poor  Eye Contact:  Minimal  Speech:  Clear and Coherent and Pressured  Language:  Fair  Volume:  Normal  Mood:  anxious  Affect:  Congruent  Thought Process:  Coherent and Linear  Thought Content:  Tangential  Suicidal Thoughts:  No  Homicidal Thoughts:  No  Judgement:  Impaired  Insight:  Lacking  Psychomotor Activity:  Normal  Akathisia:  NA  Fund of Knowledge:  Fair      Assets:  Manufacturing systems engineer Desire for Improvement Housing Social Support  Cognition:  Impaired,  Mild  ADL's:  Intact  AIMS (if indicated):        Other History   These have been pulled in through the EMR, reviewed, and updated if appropriate.  Family History:  The patient's family history includes Healthy in his father and mother.  Medical History: Past Medical History:  Diagnosis Date   Eczema    Schizoaffective disorder [facility])     Surgical History: History reviewed. No pertinent surgical history.   Medications:   Current Facility-Administered Medications:    divalproex  (DEPAKOTE  ER) 24 hr  tablet 500 mg, 500 mg, Oral, Daily, Motley-Mangrum, Ruqayyah Lute A, PMHNP, 500 mg at 07/10/23 1157   haloperidol  lactate (HALDOL ) injection 5 mg, 5 mg, Intramuscular, Q6H PRN, Motley-Mangrum, Kathline Banbury A, PMHNP   hydrOXYzine  (ATARAX ) tablet 25 mg, 25 mg, Oral, TID PRN, Motley-Mangrum, Javel Hersh A, PMHNP   LORazepam  (ATIVAN ) injection 2 mg, 2 mg, Intramuscular, Q6H PRN, Motley-Mangrum, Marquin Patino A, PMHNP   nicotine  (NICODERM CQ  - dosed in mg/24 hr) patch 7 mg, 7 mg, Transdermal, Daily, Motley-Mangrum, Phylliss Strege A, PMHNP, 7 mg at 07/10/23 1158   nicotine  polacrilex (NICORETTE ) gum 2 mg, 2 mg, Oral, Q4H while awake, Motley-Mangrum, Illeana Edick A, PMHNP   risperiDONE  (RISPERDAL  M-TABS) disintegrating tablet 0.5 mg, 0.5 mg, Oral, BID, Motley-Mangrum, Mako Pelfrey A, PMHNP, 0.5 mg at 07/10/23 1157  Current Outpatient Medications:    amLODipine  (NORVASC ) 5 MG tablet, Take 1 tablet (5 mg total) by mouth daily., Disp: 30 tablet, Rfl: 0   benztropine  (COGENTIN ) 0.5 MG tablet, Take 1 tablet (0.5 mg total) by mouth 2 (two) times daily., Disp: 60 tablet,  Rfl: 3   divalproex  (DEPAKOTE  ER) 500 MG 24 hr tablet, Take 3 tablets (1,500 mg total) by mouth at bedtime., Disp: 90 tablet, Rfl: 3   nicotine  polacrilex (NICORETTE ) 2 MG gum, Take 1 each (2 mg total) by mouth every 4 (four) hours while awake., Disp: 100 tablet, Rfl: 3   oseltamivir  (TAMIFLU ) 75 MG capsule, Take 1 capsule (75 mg total) by mouth every 12 (twelve) hours., Disp: 10 capsule, Rfl: 0   promethazine -dextromethorphan (PROMETHAZINE -DM) 6.25-15 MG/5ML syrup, Take 5 mLs by mouth every 8 (eight) hours as needed for cough., Disp: 180 mL, Rfl: 0   risperiDONE  (RISPERDAL ) 1 MG tablet, Take 1 tablet (1 mg total) by mouth daily. Start taking after you have been taking Risperdal  2 mg nightly for at least 4 days., Disp: 30 tablet, Rfl: 3   risperiDONE  (RISPERDAL ) 2 MG tablet, Take 1 tablet (2 mg total) by mouth at bedtime., Disp: 30 tablet, Rfl: 3   traZODone  (DESYREL ) 100 MG tablet, Take 1 tablet (100 mg total) by mouth at bedtime as needed for sleep., Disp: 30 tablet, Rfl: 3  Allergies: No Known Allergies  Khaiden Segreto MOTLEY-MANGRUM, PMHNP

## 2023-07-10 NOTE — Progress Notes (Signed)
   07/10/23 1500  Psych Admission Type (Psych Patients Only)  Admission Status Involuntary  Psychosocial Assessment  Patient Complaints Anxiety;Irritability;Insomnia  Eye Contact Brief  Facial Expression Animated  Affect Euphoric  Speech Logical/coherent  Interaction Assertive  Motor Activity Fidgety  Appearance/Hygiene In scrubs  Behavior Characteristics Cooperative;Appropriate to situation  Mood Anxious  Thought Process  Coherency Circumstantial  Content Blaming others  Delusions None reported or observed  Perception WDL  Hallucination None reported or observed  Judgment Impaired  Confusion None  Danger to Self  Current suicidal ideation? Denies  Self-Injurious Behavior No self-injurious ideation or behavior indicators observed or expressed   Agreement Not to Harm Self Yes  Description of Agreement verbal  Danger to Others  Danger to Others None reported or observed

## 2023-07-10 NOTE — BHH Group Notes (Signed)
 Psychoeducational Group Note  Date:  07/10/2023 Time:  05/14/2000  Group Topic/Focus:  Wrap up group  Participation Level: Did Not Attend  Participation Quality:  Not Applicable  Affect:  Not Applicable  Cognitive:  Not Applicable  Insight:  Not Applicable  Engagement in Group: Not Applicable  Additional Comments:  Did not attend.   Metta Actis S 07/10/2023, 10:05 PM

## 2023-07-10 NOTE — Plan of Care (Signed)

## 2023-07-10 NOTE — Progress Notes (Signed)
 BHH/BMU LCSW Progress Note   07/10/2023    12:34 PM  Jay Woods   [id number]   Type of Contact and Topic:  Psychiatric Bed Placement   Pt accepted to [facility] 405-1     Patient meets inpatient criteria per [name], PMHNP    The attending provider will be Dr. Alver Jobs  Call report to [phone]    Hargis Lias, Paramedic @ [facility] ED notified.     Pt scheduled  to arrive at [facility] TODAY.    Phares Brasher, MSW, LCSW-A  12:35 PM 07/10/2023

## 2023-07-10 NOTE — Progress Notes (Signed)
 Patient is a 44 male who is admitted to the unit voluntarily for compensating mood disorder. Patient is alert and oriented x 4, speech is clear, mood is labile affect congruent with mood. Patient denies SI, HI, AVH, and paranoia. Admission plan of care reviewed, consent signed. Skin and personal belongings completed. Patient oriented to the unit, staff and room. Routine safety checks initiated. Verbalizes understanding of unit rules/protocols. Patient is safe on the unit.

## 2023-07-10 NOTE — ED Notes (Signed)
 Writer called [facility] and arranged transport for pt  No time estimate given

## 2023-07-10 NOTE — ED Notes (Addendum)
 PT belongings collected, black pants, black and yellow shoes, iphone with cracks on screen, grey shirt, black socks. Pts belongings placed in cabinet in triage, below ice machine

## 2023-07-10 NOTE — ED Notes (Signed)
 Writer went and took pt his medications. Pt was very calm and polite interacting with Clinical research associate. Pt took meds with no issue.

## 2023-07-11 DIAGNOSIS — F250 Schizoaffective disorder, bipolar type: Principal | ICD-10-CM

## 2023-07-11 MED ORDER — RISPERIDONE 1 MG PO TBDP
1.0000 mg | ORAL_TABLET | Freq: Two times a day (BID) | ORAL | Status: DC
  Administered 2023-07-11 – 2023-07-12 (×2): 1 mg via ORAL
  Filled 2023-07-11 (×6): qty 1

## 2023-07-11 MED ORDER — LORAZEPAM 1 MG PO TABS
1.0000 mg | ORAL_TABLET | Freq: Two times a day (BID) | ORAL | Status: AC
  Administered 2023-07-11 – 2023-07-12 (×4): 1 mg via ORAL
  Filled 2023-07-11 (×4): qty 1

## 2023-07-11 MED ORDER — TRAZODONE HCL 50 MG PO TABS
50.0000 mg | ORAL_TABLET | Freq: Every evening | ORAL | Status: DC | PRN
  Administered 2023-07-13 – 2023-07-24 (×10): 50 mg via ORAL
  Filled 2023-07-11 (×10): qty 1

## 2023-07-11 NOTE — BHH Counselor (Signed)
 Adult Comprehensive Assessment  Patient ID: Jay [name], male   DOB: 2000/04/14, 23 y.Jay.   MRN: [id number]  Information Source: Information source: Patient  Current Stressors:  Patient states their goals for this hospitilization and ongoing recovery are:: The patient stated that he want to eat more and focus on recovery Educational / Learning stressors: The patient stated that its hard for him to focus Employment / Job issues: none reported, yes micro manage by his father Family Relationships: The patient stated that yes, more tention from his father Financial / Lack of resources (include bankruptcy): The patient stated that yes it is Housing / Lack of housing: none reported Physical health (include injuries & life threatening diseases): none reported Social relationships: none reported Substance abuse: none reported Bereavement / Loss: none reported  Living/Environment/Situation:  Living Arrangements: Parent Living conditions (as described by patient or guardian): The patient only lives with his mother Who else lives in the home?: The patient stated that live with mom and 2 sisters How long has patient lived in current situation?: 25 years  Family History:  Marital status: Long term relationship Long term relationship, how long?: "I am not sure" What types of issues is patient dealing with in the relationship?: "Miscommunication" Additional relationship information: Not assessed Are you sexually active?: Yes What is your sexual orientation?: Heterosexual Has your sexual activity been affected by drugs, alcohol, medication, or emotional stress?: No Does patient have children?: No  Childhood History:  By whom was/is the patient raised?: Both parents Additional childhood history information: Not assessed Description of patient's relationship with caregiver when they were a child: "It was a good relationship but they were strict" Patient's description of current  relationship with people who raised him/her: the patient stated that its good How were you disciplined when you got in trouble as a child/adolescent?: Spankings Does patient have siblings?: Yes Number of Siblings: 2 Description of patient's current relationship with siblings: "I get along great with my 2 sister on my mother's side but I dont get to see my 2 brothers and 1 sister on my father's side because they live further away" Did patient suffer any verbal/emotional/physical/sexual abuse as a child?: No Did patient suffer from severe childhood neglect?: No Has patient ever been sexually abused/assaulted/raped as an adolescent or adult?: No Was the patient ever a victim of a crime or a disaster?: No Witnessed domestic violence?: No Has patient been affected by domestic violence as an adult?: No  Education:  Highest grade of school patient has completed: 10 grade Currently a student?: No Learning disability?: No  Employment/Work Situation:   Employment Situation: Unemployed Patient's Job has Been Impacted by Current Illness: No What is the Longest Time Patient has Held a Job?: "On and off for 7 years" Where was the Patient Employed at that Time?: 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?: No  Financial Resources:   Surveyor, quantity resources: No income Does patient have a Lawyer or guardian?: No  Alcohol/Substance Abuse:   What has been your use of drugs/alcohol within the last 12 months?: The patient stated that he use THC vape pin If attempted suicide, did drugs/alcohol play a role in this?: No Alcohol/Substance Abuse Treatment Hx: Denies past history  Social Support System:   Forensic psychologist System: Production assistant, radio System: The patient state that he had his mother Type of faith/religion: "I believe in God and the devil" How does patient's faith help to  cope with current illness?: The patient stated  that "don't nothing bad"  Leisure/Recreation:   Do You Have Hobbies?: Yes Leisure and Hobbies: Company secretary, Soccer, Music, and Sports  Strengths/Needs:   What is the patient's perception of their strengths?: The patient stated that he likes to talk and motivated Patient states they can use these personal strengths during their treatment to contribute to their recovery: The patient stated that it helps him motivated himself to get to where he needs to be Patient states these barriers may affect/interfere with their treatment: The patient stated that the thoughts are keeping him from doing things in life Patient states these barriers may affect their return to the community: The patient stated that he doesn't know Other important information patient would like considered in planning for their treatment: none reported  Discharge Plan:   Currently receiving community mental health services: No Patient states concerns and preferences for aftercare planning are: The patient stated that he would like to get out patient therapy Patient states they will know when they are safe and ready for discharge when: none reported Does patient have access to transportation?: No Does patient have financial barriers related to discharge medications?: No  Summary/Recommendations:   Summary and Recommendations (to be completed by the evaluator): Jay [name] is a 23 year old [place] male. The patient was transfer from [facility] to [facility] for further evaluation. The patient was alert and oriented X 4 the patient. The patient is currently living with his mom and sister. The client share that his mother is from [place], and his father is from [place] The patient denies SI, HI and AVH. The patient endorsed using vape THC. The patient reports that he "acted irritational" by taking his son from his ex-girlfriend and putting him in the front seat and drove off. The patient appears to have a lot of stories to  share, the patient talked about his ex-girlfriend, with their name tattooed on his arm. The patient stated that his goals is to eat more and that he would stop eating so he can go to hospital and stated that one of his girlfriends will come to see him. The patient appears proud when talking about how many girlfriends he had. The patient states that he has racing thoughts and does not know what his strengths are. Patient will benefit from crisis stabilization, medication evaluation, group therapy and psychoeducation, in addition to case management for discharge planning. At discharge it is recommended that Patient adhere to the established discharge plan and continue in treatment  Jay [name] Jay [name]. 07/11/2023

## 2023-07-11 NOTE — Progress Notes (Signed)
   07/11/23 2015  Psych Admission Type (Psych Patients Only)  Admission Status Involuntary  Psychosocial Assessment  Patient Complaints Anxiety  Eye Contact Fair  Facial Expression Anxious;Animated  Affect Euphoric  Speech Logical/coherent  Interaction Assertive  Motor Activity Slow  Appearance/Hygiene Unremarkable  Behavior Characteristics Cooperative  Mood Anxious;Preoccupied  Aggressive Behavior  Effect No apparent injury  Thought Process  Coherency Circumstantial  Content Blaming self  Delusions None reported or observed  Perception WDL  Hallucination None reported or observed  Judgment Impaired  Confusion WDL  Danger to Self  Current suicidal ideation? Denies  Danger to Others  Danger to Others None reported or observed

## 2023-07-11 NOTE — Plan of Care (Signed)
   Problem: Activity: Goal: Interest or engagement in activities will improve Outcome: Progressing   Problem: Coping: Goal: Ability to verbalize frustrations and anger appropriately will improve Outcome: Progressing   Problem: Safety: Goal: Periods of time without injury will increase Outcome: Progressing

## 2023-07-11 NOTE — Plan of Care (Signed)
  Problem: Education: Goal: Emotional status will improve Outcome: Progressing   Problem: Activity: Goal: Interest or engagement in activities will improve Outcome: Progressing Goal: Sleeping patterns will improve Outcome: Progressing

## 2023-07-11 NOTE — BH Assessment (Signed)
 Patient has been pleasant and isolative. Pt has had problems sleeping tonight. Denies SI/HI, and AVH. Did not attend group tonight.

## 2023-07-11 NOTE — Group Note (Signed)
 Date:  07/11/2023 Time:  9:26 AM  Group Topic/Focus:  Goals Group:   The focus of this group is to help patients establish daily goals to achieve during treatment and discuss how the patient can incorporate goal setting into their daily lives to aide in recovery. Orientation:   The focus of this group is to educate the patient on the purpose and policies of crisis stabilization and provide a format to answer questions about their admission.  The group details unit policies and expectations of patients while admitted.    Participation Level:  Did Not Attend

## 2023-07-11 NOTE — Progress Notes (Signed)
   07/11/23 0800  Psych Admission Type (Psych Patients Only)  Admission Status Involuntary  Psychosocial Assessment  Patient Complaints Restlessness;Hopelessness  Eye Contact Fair  Facial Expression Animated  Affect Euphoric;Inconsistent with thought content  Child psychotherapist Assertive  Appearance/Hygiene Unremarkable  Behavior Characteristics Cooperative;Appropriate to situation  Mood Sad  Thought Process  Coherency WDL  Content Blaming self  Delusions None reported or observed  Perception WDL  Hallucination None reported or observed  Judgment Impaired  Confusion None  Danger to Self  Current suicidal ideation? Denies  Self-Injurious Behavior No self-injurious ideation or behavior indicators observed or expressed   Agreement Not to Harm Self Yes  Description of Agreement verbal  Danger to Others  Danger to Others None reported or observed

## 2023-07-11 NOTE — H&P (Signed)
 Psychiatric Admission Assessment Adult  Patient Identification: Jay [name] Jay [name] [name] MRN:  [id number] Date of Evaluation:  07/11/2023 Chief Complaint:  Schizoaffective disorder, bipolar type (HCC) [F25.0] Principal Diagnosis: Schizoaffective disorder, bipolar type (HCC) Diagnosis:  Principal Problem:   Schizoaffective disorder, bipolar type (HCC)  History of Present Illness:  Jay [name] Jay [name] [name] is Jay [name] [name] Jay [name] yr Jay [name] Jay [name] [name] who presented on 5/17 to [facility] via law enforcement under IVC for erratic aggressive behavior towards Jay family, he was admitted to [facility] on 5/18.  PPHx is significant for Schizoaffective Disorder, GAD, IED, Polysubstance Abuse (Benzo, Meth, THC), and 4 Prior Psychiatric Hospitalizations (last- Jay [name] Jay [name] [name] 07/2022), and no history of Suicide Attempts or Self Injurious Behavior.  He reports that he was hospitalized because lately he has been sad, depressed, and anxious but also because he is bipolar.  He reports he has been with many Jay [name] Jay [name] [name].  He reports he was talking with Jay "Jay [name] Jay [name] [name]" who is also Jay [name] [name] Jay [name] Jay [name] [name] and he wanted her to understand.  He reports that he listened to Jay [name] [name] lot of music and will lose control.  He then reports that he took Jay son from Jay girlfriend to get her to come with them to Jay [name] Jay [name] [name]'Jay [name] [name] house.  He reports Jay [name] [name] past psychiatric history significant for Schizoaffective Disorder, GAD, IED, Polysubstance Abuse (Benzo, Meth, THC).  He reports no history of suicide attempts.  He reports no history of self-injurious behavior.  He reports 4 prior psychiatric hospitalizations-last Jay [name] Jay [name] [name] 07/2022.  He reports no significant past medical history.  He reports no significant past surgical history.  He reports no history of head trauma.  He reports no history of seizures.  He reports NKDA.  He reports that he currently lives with Jay [name] Jay [name] [name] and 2 sisters (41, 61).  He reports he is currently unemployed.  He reports that he completed the 10th grade.  He reports  occasional alcohol use.  He reports that he smokes some cigarettes but also vapes.  He reports he vapes THC.  He reports no current legal issues.  He reports no access to firearms.  Discussed with him that he had already been started on Risperdal  and Depakote .  He reports that he thinks these medicines have worked for him in the past.  Discussed we would continue with these for now and he was agreeable with this.  He reports no other concerns at present.   Attempted to call patient'Jay [name] [name] Jay [name] [name], Jay [name] Jay [name] [name], [phone] for Collateral.  There was no answer.  Will attempt at Jay [name] [name] later date.  Associated Signs/Symptoms: Depression Symptoms:  Reports Depression and low energy (Hypo) Manic Symptoms:  Distractibility, Flight of Ideas, Grandiosity, Impulsivity, Irritable Mood, Labiality of Mood, Anxiety Symptoms:  Reports None Psychotic Symptoms:  Reports None PTSD Symptoms:  Total Time spent with patient: 1 hour  Past Psychiatric History:  Schizoaffective Disorder, GAD, IED, Polysubstance Abuse (Benzo, Meth, THC), and 4 Prior Psychiatric Hospitalizations (last- Jay [name] Jay [name] [name] 07/2022), and no history of Suicide Attempts or Self Injurious Behavior.  Is the patient at risk to self? Yes.    Has the patient been Jay [name] [name] risk to self in the past 6 months? No.  Has the patient been Jay [name] [name] risk to self within the distant past? No.  Is the patient Jay [name] [name] risk to others? Yes.    Has the patient been Jay [name] [name] risk to others in the past 6 months? No.  Has the patient been Jay [name] [name] risk to others within the distant past? No.   [place] Scale:  Flowsheet Row Admission (Current)  [facility] Most recent reading at 07/10/2023  3:00 PM ED from 07/10/2023 in [facility] Department at [facility] Most recent reading at 07/10/2023  2:46 AM UC from 03/22/2023 in [facility] [place] Most recent reading at 03/22/2023  6:12 PM  C-SSRS RISK CATEGORY No Risk  No Risk No Risk        Prior Inpatient Therapy: Yes.   If yes, describe Jay [name] Jay [name] [name] 07/2022  Prior Outpatient Therapy: No. If yes, describe N/Jay [name] [name]   Alcohol Screening: 1. How often do you have Jay [name] [name] drink containing alcohol?: 2 to 4 times Jay [name] [name] month 2. How many drinks containing alcohol do you have on Jay [name] [name] typical day when you are drinking?: 1 or 2 3. How often do you have six or more drinks on one occasion?: Less than monthly AUDIT-C Score: 3 4. How often during the last year have you found that you were not able to stop drinking once you had started?: Never 5. How often during the last year have you failed to do what was normally expected from you because of drinking?: Never 6. How often during the last year have you needed Jay [name] [name] first drink in the morning to get yourself going after Jay [name] [name] heavy drinking session?: Never 7. How often during the last year have you had Jay [name] [name] feeling of guilt of remorse after drinking?: Never 8. How often during the last year have you been unable to remember what happened the night before because you had been drinking?: Never 9. Have you or someone else been injured as Jay [name] [name] result of your drinking?: No 10. Has Jay [name] [name] relative or friend or Jay [name] [name] doctor or another health worker been concerned about your drinking or suggested you cut down?: No Alcohol Use Disorder Identification Test Final Score (AUDIT): 3 Alcohol Brief Interventions/Follow-up: Alcohol education/Brief advice Substance Abuse History in the last 12 months:  No. Consequences of Substance Abuse: NA Previous Psychotropic Medications: Yes  Risperdal , Depakote , Lithium, Tegretol, Trileptal, Haldol , Zyprexa  Psychological Evaluations: No  Past Medical History:  Past Medical History:  Diagnosis Date   Eczema    Schizoaffective disorder (HCC)    History reviewed. No pertinent surgical history. Family History:  Family History  Problem Relation Age of Onset   Healthy Jay [name] [name]    Healthy Father    Family Psychiatric  History:  He  reports possible family diagnosis' Reports No Known Suicides or Substance Abuse  Tobacco Screening:  Social History   Tobacco Use  Smoking Status Former   Current packs/day: 0.Jay [name]   Average packs/day: 0.3 packs/day for 5.0 years (1.3 ttl pk-yrs)   Types: Cigarettes  Smokeless Tobacco Former    BH Tobacco Counseling     Are you interested in Tobacco Cessation Medications?  No, patient refused Counseled patient on smoking cessation:  Refused/Declined practical counseling Reason Tobacco Screening Not Completed: No value filed.       Social History:  Social History   Substance and Sexual Activity  Alcohol Use Not Currently   Alcohol/week: 1.0 standard drink of alcohol   Types: 1 Cans of beer per week   Comment: last use 09/30/21     Social History   Substance and Sexual Activity  Drug Use Not Currently   Comment: Hx of cocaine, marijuana, k2, Xanax, and Molly on and off for the past 5 years    Additional Social History: Marital status: Long term relationship Long term relationship, how long?: "I am not sure" What  types of issues is patient dealing with in the relationship?: "Miscommunication" Additional relationship information: Not assessed Are you sexually active?: Yes What is your sexual orientation?: Heterosexual Has your sexual activity been affected by drugs, alcohol, medication, or emotional stress?: No Does patient have children?: No                         Allergies:  No Known Allergies Lab Results:  Results for orders placed or performed during the hospital encounter of 05/17/Jay [name] (from the past 48 hours)  Comprehensive metabolic panel     Status: Abnormal   Collection Time: 05/17/Jay [name]  2:59 AM  Result Value Ref Range   Sodium 138 135 - 145 mmol/L   Potassium 3.4 (L) 3.5 - 5.1 mmol/L   Chloride 106 98 - 111 mmol/L   CO2 24 22 - 32 mmol/L   Glucose, Bld 113 (H) 70 - 99 mg/dL    Comment: Glucose reference range applies only to samples taken after  fasting for at least 8 hours.   BUN 15 6 - 20 mg/dL   Creatinine, Ser 1.61 0.61 - 1.24 mg/dL   Calcium 9.6 8.9 - 09.6 mg/dL   Total Protein 8.7 (H) 6.5 - 8.1 g/dL   Albumin 5.0 3.5 - 5.0 g/dL   AST 23 15 - 41 U/L   ALT 22 0 - 44 U/L   Alkaline Phosphatase 51 38 - 126 U/L   Total Bilirubin 1.2 0.0 - 1.2 mg/dL   GFR, Estimated >04 >54 mL/min    Comment: (NOTE) Calculated using the CKD-EPI Creatinine Equation (2021)    Anion gap 8 5 - 15    Comment: Performed at [facility], [address], [place], [place] [address]  Ethanol     Status: None   Collection Time: 05/17/Jay [name]  2:59 AM  Result Value Ref Range   Alcohol, Ethyl (B) <15 <15 mg/dL    Comment: Please note change in reference range. (NOTE) For medical purposes only. Performed at [facility], [address] [place], [place] [address]   cbc     Status: None   Collection Time: 05/17/Jay [name]  2:59 AM  Result Value Ref Range   WBC 7.8 4.0 - 10.5 K/uL   RBC 5.02 4.22 - 5.81 MIL/uL   Hemoglobin 15.2 13.0 - 17.0 g/dL   HCT 29.5 62.1 - 30.8 %   MCV 91.0 80.0 - 100.0 fL   MCH 30.3 26.0 - 34.0 pg   MCHC 33.3 30.0 - 36.0 g/dL   RDW 65.7 84.6 - 96.2 %   Platelets 299 150 - 400 K/uL   nRBC 0.0 0.0 - 0.2 %    Comment: Performed at [facility], [address], [place], [place] [address]    Blood Alcohol level:  Lab Results  Component Value Date   [facility] <15 07/10/2023   ETH <10 09/24/2021    Metabolic Disorder Labs:  Lab Results  Component Value Date   HGBA1C 5.4 09/24/2021   MPG 108.28 09/24/2021   MPG 114 06/09/2020   Lab Results  Component Value Date   PROLACTIN 4.4 06/09/2020   Lab Results  Component Value Date   CHOL 136 09/24/2021   TRIG 31 09/24/2021   HDL 49 09/24/2021   CHOLHDL 2.8 09/24/2021   VLDL 6 09/24/2021   LDLCALC 81 09/24/2021   LDLCALC 81 06/09/2020    Current Medications: Current Facility-Administered Medications  Medication Dose Route  Frequency  Provider Last Rate Last Admin   acetaminophen  (TYLENOL ) tablet 650 mg  650 mg Oral Q6H PRN Motley-Mangrum, Jay [name] Jay [name] [name], PMHNP   650 mg at 05/18/Jay [name] 0030   alum & mag hydroxide-simeth (MAALOX/MYLANTA) 200-200-20 MG/5ML suspension 30 mL  30 mL Oral Q4H PRN Motley-Mangrum, Jay [name] Jay [name] [name], PMHNP       haloperidol  (HALDOL ) tablet 5 mg  5 mg Oral TID PRN Motley-Mangrum, Jay [name] Jay [name] [name], PMHNP   5 mg at 05/18/Jay [name] 3086   And   diphenhydrAMINE  (BENADRYL ) capsule 50 mg  50 mg Oral TID PRN Motley-Mangrum, Jay [name] Jay [name] [name], PMHNP   50 mg at 05/18/Jay [name] 5784   haloperidol  lactate (HALDOL ) injection 5 mg  5 mg Intramuscular TID PRN Motley-Mangrum, Jay [name] Jay [name] [name], PMHNP       And   diphenhydrAMINE  (BENADRYL ) injection 50 mg  50 mg Intramuscular TID PRN Motley-Mangrum, Jay [name] Jay [name] [name], PMHNP       And   LORazepam  (ATIVAN ) injection 2 mg  2 mg Intramuscular TID PRN Motley-Mangrum, Jay [name] Jay [name] [name], PMHNP       haloperidol  lactate (HALDOL ) injection 10 mg  10 mg Intramuscular TID PRN Motley-Mangrum, Jay [name] Jay [name] [name], PMHNP       And   diphenhydrAMINE  (BENADRYL ) injection 50 mg  50 mg Intramuscular TID PRN Motley-Mangrum, Jay [name] Jay [name] [name], PMHNP       And   LORazepam  (ATIVAN ) injection 2 mg  2 mg Intramuscular TID PRN Motley-Mangrum, Jay [name] Jay [name] [name], PMHNP       divalproex  (DEPAKOTE  ER) 24 hr tablet 500 mg  500 mg Oral Daily Motley-Mangrum, Jay [name] Jay [name] [name], PMHNP   500 mg at 05/18/Jay [name] 0754   feeding supplement (ENSURE ENLIVE / ENSURE PLUS) liquid 237 mL  237 mL Oral BID BM Jay [name] Jay [name] [name], Nadir, Jay [name] Jay [name] [name]   237 mL at 05/18/Jay [name] 1338   hydrOXYzine  (ATARAX ) tablet Jay [name] mg  Jay [name] mg Oral TID PRN Motley-Mangrum, Jay [name] Jay [name] [name], PMHNP   Jay [name] mg at 05/18/Jay [name] 0030   LORazepam  (ATIVAN ) tablet 1 mg  1 mg Oral BID Jay [name] [place] Jay [name] [name], Jay [name] Jay [name] [name]   1 mg at 05/18/Jay [name] 1157   magnesium  hydroxide (MILK OF MAGNESIA) suspension 30 mL  30 mL Oral Daily PRN Motley-Mangrum, Jay [name] Jay [name] [name], PMHNP       nicotine  polacrilex (NICORETTE ) gum 2 mg  2 mg Oral Q4H while awake Motley-Mangrum, Jay [name] Jay [name] [name], PMHNP   2 mg at 05/18/Jay [name] 1338   risperiDONE   (RISPERDAL  M-TABS) disintegrating tablet 1 mg  1 mg Oral BID Jay [name] L[place]os Jay [name] [name], Jay [name] Jay [name] [name]       PTA Medications: No medications prior to admission.    Musculoskeletal: Strength & Muscle Tone: within normal limits Gait & Station: normal Patient leans: N/Jay [name] [name]            Psychiatric Specialty Exam:  Presentation  General Appearance: Casual  Eye Contact:Fair  Speech:Clear and Coherent (rapid)  Speech Volume:Normal  Handedness:Ambidextrous   Mood and Affect  Mood:Euphoric; Anxious  Affect:Labile; Constricted   Thought Process  Thought Processes:Disorganized  Duration of Psychotic Symptoms:N/Jay [name] [name] Past Diagnosis of Schizophrenia or Psychoactive disorder: No data recorded Descriptions of Associations:Tangential  Orientation:Full (Time, Place and Person)  Thought Content:Scattered  Hallucinations:Hallucinations: None  Ideas of Reference:None  Suicidal Thoughts:Suicidal Thoughts: No  Homicidal Thoughts:Homicidal Thoughts: No   Sensorium  Memory:Immediate Fair  Judgment:Intact  Insight:Present   Executive Functions  Concentration:Poor  Attention Span:Poor  Recall:Poor  Fund of Knowledge:Poor  Language:Fair   Psychomotor Activity  Psychomotor Activity:Psychomotor Activity: Normal   Assets  Assets:Physical Health; Resilience   Sleep  Sleep:Sleep: Fair    Physical Exam: Physical Exam Vitals and  nursing note reviewed.  Constitutional:      General: He is not in acute distress.    Appearance: Normal appearance. He is normal weight. He is not ill-appearing or toxic-appearing.  HENT:     Head: Normocephalic and atraumatic.  Pulmonary:     Effort: Pulmonary effort is normal.  Musculoskeletal:        General: Normal range of motion.  Neurological:     General: No focal deficit present.     Mental Status: He is alert.    Review of Systems  Respiratory:  Negative for cough and shortness of breath.   Cardiovascular:  Negative for chest  pain.  Gastrointestinal:  Negative for abdominal pain, constipation, diarrhea, nausea and vomiting.  Neurological:  Negative for dizziness, weakness and headaches.  Psychiatric/Behavioral:  Positive for depression. Negative for hallucinations and suicidal ideas. The patient is not nervous/anxious.    Blood pressure 132/87, pulse 68, temperature 97.6 F (36.4 C), temperature source Oral, resp. rate 16, height 5\' 9"  (1.753 m), weight 63 kg, SpO2 100%. Body mass index is 20.53 kg/m.  Treatment Plan Summary: Daily contact with patient to assess and evaluate symptoms and progress in treatment and Medication management  Jay [name] Jay [name] [name] is Jay [name] [name] Jay [name] yr Jay [name] Jay [name] [name] who presented on 5/17 to [facility] via law enforcement under IVC for erratic aggressive behavior towards Jay family, he was admitted to [facility] on 5/18.  PPHx is significant for Schizoaffective Disorder, GAD, IED, Polysubstance Abuse (Benzo, Meth, THC), and 4 Prior Psychiatric Hospitalizations (last- Jay [name] Jay [name] [name] 07/2022), and no history of Suicide Attempts or Self Injurious Behavior.   Jerauld is currently manic being intrusive and disorganized in Jay thinking.  He was started on Depakote  and Risperdal  in the ED so we will increase Jay Risperdal .  We will also start Ativan  for 2 days further mood stability.  We will not make any other changes to Jay medications at this time.  We will uphold the IVC.   Schizoaffective Disorder, Bipolar Type, current episode Manic  IED: -Increase Risperdal  to 1 mg BID this evening for mood stability and psychosis -Continue Depakote  ER 500 mg daily for mood stability -Start Ativan  1 mg BID for mood stability for 2 days -Continue Agitation Protocol: Haldol /Ativan /Benadryl    Nicotine  Dependence: -Continue Nicotine  Gum 2 mg q4 PRN   -Continue Ensure 237 mL BID -Continue PRN'Jay [name] [name]: Tylenol , Maalox, Atarax , Milk of Magnesia   Observation Level/Precautions:  15 minute checks  Laboratory:  CMP: WNL except K: 3.4,  Tot  Prot: 8.7,  CBC: WNL, EtOH: Neg,  EKG, Lipid Panel, A1c, TSH, CMP, UDS ordered  Psychotherapy:    Medications:  Risperdal ,  Depakote   Consultations:    Discharge Concerns:    Estimated LOS: 5-7 days  Other:     Physician Treatment Plan for Primary Diagnosis: Schizoaffective disorder, bipolar type (HCC) Long Term Goal(Jay [name] [name]): Improvement in symptoms so as ready for discharge  Short Term Goals: Ability to identify changes in lifestyle to reduce recurrence of condition will improve, Ability to maintain clinical measurements within normal limits will improve, Compliance with prescribed medications will improve, and Ability to identify triggers associated with substance abuse/mental health issues will improve  Physician Treatment Plan for Secondary Diagnosis: Principal Problem:   Schizoaffective disorder, bipolar type (HCC)  Long Term Goal(Jay [name] [name]): Improvement in symptoms so as ready for discharge  Short Term Goals: Ability to identify changes in lifestyle to reduce recurrence of condition will improve, Ability to maintain clinical measurements within normal limits will improve, Compliance with prescribed  medications will improve, and Ability to identify triggers associated with substance abuse/mental health issues will improve  I certify that inpatient services furnished can reasonably be expected to improve the patient'Jay [name] [name] condition.    Jay [name] Bosworth, Jay [name] Jay [name] [name] 5/18/20252:52 PM

## 2023-07-11 NOTE — [facility] Suicide Risk Assessment (Signed)
 [facility] Admission Suicide Risk Assessment   Nursing information obtained from:  Patient Demographic factors:  Male, Unemployed Current Mental Status:  NA Loss Factors:  Financial problems / change in socioeconomic status Historical Factors:  Impulsivity Risk Reduction Factors:  Responsible for children under 25 years of age  Total Time spent with patient: 1 hour Principal Problem: Schizoaffective disorder, bipolar type (HCC) Diagnosis:  Principal Problem:   Schizoaffective disorder, bipolar type (HCC)  Subjective Data:  Decklan Artica-Limones is a 23 yr old male who presented on 5/17 to [facility] via law enforcement under IVC for erratic aggressive behavior towards his family, he was admitted to [facility] on 5/18.  PPHx is significant for Schizoaffective Disorder, GAD, IED, Polysubstance Abuse (Benzo, Meth, THC), and 4 Prior Psychiatric Hospitalizations (last- [name]/2022), and no history of Suicide Attempts or Self Injurious Behavior.   He reports that he was hospitalized because lately he has been sad, depressed, and anxious but also because he is bipolar.  He reports he has been with many [place] girls.  He reports he was talking with his "future wife" who is also a [place] girl and he wanted her to understand.  He reports that he listened to a lot of music and will lose control.  He then reports that he took his son from his girlfriend to get her to come with them to his mother's house.   He reports a past psychiatric history significant for Schizoaffective Disorder, GAD, IED, Polysubstance Abuse (Benzo, Meth, THC).  He reports no history of suicide attempts.  He reports no history of self-injurious behavior.  He reports 4 prior psychiatric hospitalizations-last [name]/2022.  He reports no significant past medical history.  He reports no significant past surgical history.  He reports no history of head trauma.  He reports no history of seizures.  He reports NKDA.   He reports that he  currently lives with his mother and 2 sisters (31, 63).  He reports he is currently unemployed.  He reports that he completed the 10th grade.  He reports occasional alcohol use.  He reports that he smokes some cigarettes but also vapes.  He reports he vapes THC.  He reports no current legal issues.  He reports no access to firearms.   Discussed with him that he had already been started on Risperdal  and Depakote .  He reports that he thinks these medicines have worked for him in the past.  Discussed we would continue with these for now and he was agreeable with this.  He reports no other concerns at present.     Attempted to call patient's mother, Gricelva Limones, [phone] for Collateral.  There was no answer.  Will attempt at a later date.  Continued Clinical Symptoms:  Alcohol Use Disorder Identification Test Final Score (AUDIT): 3 The "Alcohol Use Disorders Identification Test", Guidelines for Use in Primary Care, Second Edition.  World Science writer [facility]). Score between 0-7:  no or low risk or alcohol related problems. Score between 8-15:  moderate risk of alcohol related problems. Score between 16-19:  high risk of alcohol related problems. Score 20 or above:  warrants further diagnostic evaluation for alcohol dependence and treatment.   CLINICAL FACTORS:   More than one psychiatric diagnosis Unstable or Poor Therapeutic Relationship Previous Psychiatric Diagnoses and Treatments Schizoaffective Disorder- currently manic  Musculoskeletal: Strength & Muscle Tone: within normal limits Gait & Station: normal Patient leans: N/A  Psychiatric Specialty Exam:  Presentation  General Appearance: Casual  Eye Contact:Fair  Speech:Clear and Coherent (rapid)  Speech Volume:Normal  Handedness:Ambidextrous   Mood and Affect  Mood:Euphoric; Anxious  Affect:Labile; Constricted   Thought Process  Thought Processes:Disorganized  Descriptions of  Associations:Tangential  Orientation:Full (Time, Place and Person)  Thought Content:Scattered  History of Schizophrenia/Schizoaffective disorder:No data recorded Duration of Psychotic Symptoms:No data recorded Hallucinations:Hallucinations: None  Ideas of Reference:None  Suicidal Thoughts:Suicidal Thoughts: No  Homicidal Thoughts:Homicidal Thoughts: No   Sensorium  Memory:Immediate Fair  Judgment:Intact  Insight:Present   Executive Functions  Concentration:Poor  Attention Span:Poor  Recall:Poor  Fund of Knowledge:Poor  Language:Fair   Psychomotor Activity  Psychomotor Activity:Psychomotor Activity: Normal   Assets  Assets:Physical Health; Resilience   Sleep  Sleep:Sleep: Fair    Physical Exam: Physical Exam Vitals and nursing note reviewed.  Constitutional:      General: He is not in acute distress.    Appearance: Normal appearance. He is normal weight. He is not ill-appearing or toxic-appearing.  HENT:     Head: Normocephalic and atraumatic.  Pulmonary:     Effort: Pulmonary effort is normal.  Musculoskeletal:        General: Normal range of motion.  Neurological:     General: No focal deficit present.     Mental Status: He is alert.    Review of Systems  Respiratory:  Negative for cough and shortness of breath.   Cardiovascular:  Negative for chest pain.  Gastrointestinal:  Negative for abdominal pain, constipation, diarrhea, nausea and vomiting.  Neurological:  Negative for dizziness, weakness and headaches.  Psychiatric/Behavioral:  Positive for depression. Negative for hallucinations and suicidal ideas. The patient is not nervous/anxious.    Blood pressure 132/87, pulse 68, temperature 97.6 F (36.4 C), temperature source Oral, resp. rate 16, height 5\' 9"  (1.753 m), weight 63 kg, SpO2 100%. Body mass index is 20.53 kg/m.   COGNITIVE FEATURES THAT CONTRIBUTE TO RISK:  Loss of executive function    SUICIDE RISK:   Mild:  Suicidal  ideation of limited frequency, intensity, duration, and specificity.  There are no identifiable plans, no associated intent, mild dysphoria and related symptoms, good self-control (both objective and subjective assessment), few other risk factors, and identifiable protective factors, including available and accessible social support.  PLAN OF CARE:   Kawhi Artica-Limones is a 23 yr old male who presented on 5/17 to [facility] via law enforcement under IVC for erratic aggressive behavior towards his family, he was admitted to [facility] on 5/18.  PPHx is significant for Schizoaffective Disorder, GAD, IED, Polysubstance Abuse (Benzo, Meth, THC), and 4 Prior Psychiatric Hospitalizations (last- [name]/2022), and no history of Suicide Attempts or Self Injurious Behavior.     Kairee is currently manic being intrusive and disorganized in his thinking.  He was started on Depakote  and Risperdal  in the ED so we will increase his Risperdal .  We will also start Ativan  for 2 days further mood stability.  We will not make any other changes to his medications at this time.  We will uphold the IVC.     Schizoaffective Disorder, Bipolar Type, current episode Manic  IED: -Increase Risperdal  to 1 mg BID this evening for mood stability and psychosis -Continue Depakote  ER 500 mg daily for mood stability -Start Ativan  1 mg BID for mood stability for 2 days -Continue Agitation Protocol: Haldol /Ativan /Benadryl      Nicotine  Dependence: -Continue Nicotine  Gum 2 mg q4 PRN     -Continue Ensure 237 mL BID -Continue PRN's: Tylenol , Maalox, Atarax , Milk of Magnesia  I certify that  inpatient services furnished can reasonably be expected to improve the patient's condition.   Basilia Bosworth, MD 07/11/2023, 2:54 PM

## 2023-07-11 NOTE — Group Note (Signed)
 Date:  07/11/2023 Time:  9:28 PM  Group Topic/Focus:  Wrap-Up Group:   The focus of this group is to help patients review their daily goal of treatment and discuss progress on daily workbooks.    Participation Level:  Active  Participation Quality:  Appropriate  Affect:  Appropriate  Cognitive:  Appropriate  Insight: Appropriate and Good  Engagement in Group:  Engaged  Modes of Intervention:  Discussion  Additional Comments:  patient stated his day was a 9 out of a 10 he felt that the support is good on the unit   [employer] 07/11/2023, 9:28 PM

## 2023-07-11 NOTE — BH Assessment (Addendum)
 Patient is awake and in manic state as evidenced by flushing toilet approx. 20 times, running up and down hall with hands in the air and increased B/P. Of 149/100 po hadol and po benadryl  given. Also when patient came to window to get medication, he was talking about a band and then his mother. Thought process are all over the place

## 2023-07-12 ENCOUNTER — Encounter (HOSPITAL_COMMUNITY): Payer: Self-pay

## 2023-07-12 LAB — LIPID PANEL
Cholesterol: 126 mg/dL (ref 0–200)
HDL: 44 mg/dL (ref >40)
LDL Cholesterol: 72 mg/dL (ref 0–99)
Total CHOL/HDL Ratio: 2.9 ratio
Triglycerides: 48 mg/dL (ref <150)
VLDL: 10 mg/dL (ref 0–40)

## 2023-07-12 LAB — COMPREHENSIVE METABOLIC PANEL WITH GFR
ALT: 18 U/L (ref 0–44)
AST: 17 U/L (ref 15–41)
Albumin: 4.6 g/dL (ref 3.5–5.0)
Alkaline Phosphatase: 48 U/L (ref 38–126)
Anion gap: 8 (ref 5–15)
BUN: 9 mg/dL (ref 6–20)
CO2: 26 mmol/L (ref 22–32)
Calcium: 9.3 mg/dL (ref 8.9–10.3)
Chloride: 104 mmol/L (ref 98–111)
Creatinine, Ser: 0.79 mg/dL (ref 0.61–1.24)
GFR, Estimated: >60 mL/min (ref >60)
Glucose, Bld: 99 mg/dL (ref 70–99)
Potassium: 4.1 mmol/L (ref 3.5–5.1)
Sodium: 138 mmol/L (ref 135–145)
Total Bilirubin: 1.1 mg/dL (ref 0.0–1.2)
Total Protein: 8.2 g/dL — ABNORMAL HIGH (ref 6.5–8.1)

## 2023-07-12 LAB — TSH: TSH: 1.072 u[IU]/mL (ref 0.350–4.500)

## 2023-07-12 LAB — HEMOGLOBIN A1C
Hgb A1c MFr Bld: 5 % (ref 4.8–5.6)
Mean Plasma Glucose: 96.8 mg/dL

## 2023-07-12 MED ORDER — RISPERIDONE 2 MG PO TBDP
2.0000 mg | ORAL_TABLET | Freq: Two times a day (BID) | ORAL | Status: DC
  Administered 2023-07-12 – 2023-07-16 (×9): 2 mg via ORAL
  Filled 2023-07-12 (×8): qty 1

## 2023-07-12 NOTE — Progress Notes (Signed)
 [facility] MD Progress Note  07/12/2023 11:37 AM Jay Woods  MRN:  [id number] Subjective:   Jay Woods is a 23 yr Jay male who presented on 5/17 to [facility] via law enforcement under IVC for erratic aggressive behavior towards his family, he was admitted to [facility] on 5/18. PPHx is significant for Schizoaffective Disorder, GAD, IED, Polysubstance Abuse (Benzo, Meth, THC), and 4 Prior Psychiatric Hospitalizations (last- Jay Woods 07/2022), and no history of Suicide Attempts or Self Injurious Behavior.    Case was discussed in the multidisciplinary team. MAR was reviewed and patient was compliant with medications.  He did not require any PRN medications after his initial agitation medications he required yesterday morning.   Psychiatric Team made the following recommendations yesterday: -Increase Risperdal  to 1 mg BID this evening for mood stability and psychosis -Continue Depakote  ER 500 mg daily for mood stability -Start Ativan  1 mg BID for mood stability for 2 days    On interview today patient reports he slept good last night.  He reports his appetite is doing good.  He reports no SI, HI, or AVH.  He reports no Paranoia or Ideas of Reference.  He reports no issues with his medications.  He reports that he needs his son.  When asked if he has spoken with his mother or girlfriend to see how his son is he reports no.  Discussed with him that we would continue to adjust his medications and he was agreeable to this.  He reports no other concerns at present.   Principal Problem: Schizoaffective disorder, bipolar type (HCC) Diagnosis: Principal Problem:   Schizoaffective disorder, bipolar type (HCC)  Total Time spent with patient:  I personally spent 35 minutes on the unit in direct patient care. The direct patient care time included face-to-face time with the patient, reviewing the patient's chart, communicating with other professionals, and coordinating care. Greater than 50% of this  time was spent in counseling or coordinating care with the patient regarding goals of hospitalization, psycho-education, and discharge planning needs.   Past Psychiatric History:  Schizoaffective Disorder, GAD, IED, Polysubstance Abuse (Benzo, Meth, THC), and 4 Prior Psychiatric Hospitalizations (last- Jay Woods 07/2022), and no history of Suicide Attempts or Self Injurious Behavior.   Past Medical History:  Past Medical History:  Diagnosis Date   Eczema    Schizoaffective disorder (HCC)    History reviewed. No pertinent surgical history. Family History:  Family History  Problem Relation Age of Onset   Healthy Mother    Healthy Father    Family Psychiatric  History:  He reports possible family diagnosis' Reports No Known Suicides or Substance Abuse   Social History:  Social History   Substance and Sexual Activity  Alcohol Use Not Currently   Alcohol/week: 1.0 standard drink of alcohol   Types: 1 Cans of beer per week   Comment: last use 09/30/21     Social History   Substance and Sexual Activity  Drug Use Not Currently   Comment: Hx of cocaine, marijuana, k2, Xanax, and Molly on and off for the past 5 years    Social History   Socioeconomic History   Marital status: Single    Spouse name: Not on file   Number of children: Not on file   Years of education: Not on file   Highest education level: Not on file  Occupational History   Not on file  Tobacco Use   Smoking status: Former    Current packs/day: 0.25    Average  packs/day: 0.3 packs/day for 5.0 years (1.3 ttl pk-yrs)    Types: Cigarettes   Smokeless tobacco: Former  Building services engineer status: Every Day   Substances: CBD  Substance and Sexual Activity   Alcohol use: Not Currently    Alcohol/week: 1.0 standard drink of alcohol    Types: 1 Cans of beer per week    Comment: last use 09/30/21   Drug use: Not Currently    Comment: Hx of cocaine, marijuana, k2, Xanax, and Molly on and off for the past 5 years    Sexual activity: Yes    Partners: Female    Birth control/protection: None  Other Topics Concern   Not on file  Social History Narrative   Not on file   Social Drivers of Health   Financial Resource Strain: High Risk (10/06/2021)   Overall Financial Resource Strain (CARDIA)    Difficulty of Paying Living Expenses: Very hard  Food Insecurity: Food Insecurity Present (07/10/2023)   Hunger Vital Sign    Worried About Running Out of Food in the Last Year: Sometimes true    Ran Out of Food in the Last Year: Sometimes true  Transportation Needs: Unmet Transportation Needs (07/10/2023)   PRAPARE - Transportation    Lack of Transportation (Medical): Yes    Lack of Transportation (Non-Medical): Yes  Physical Activity: Sufficiently Active (10/06/2021)   Exercise Vital Sign    Days of Exercise per Week: 7 days    Minutes of Exercise per Session: 30 min  Stress: No Stress Concern Present (10/06/2021)   [employer] of Occupational Health - Occupational Stress Questionnaire    Feeling of Stress : Only a little  Social Connections: Socially Integrated (10/06/2021)   Social Connection and Isolation Panel [NHANES]    Frequency of Communication with Friends and Family: More than three times a week    Frequency of Social Gatherings with Friends and Family: More than three times a week    Attends Religious Services: More than 4 times per year    Active Member of [employer] or Organizations: No    Attends Engineer, structural: More than 4 times per year    Marital Status: Living with partner   Additional Social History:                         Sleep: Good  Appetite:  Good  Current Medications: Current Facility-Administered Medications  Medication Dose Route Frequency Provider Last Rate Last Admin   acetaminophen  (TYLENOL ) tablet 650 mg  650 mg Oral Q6H PRN Motley-Mangrum, Jadeka A, PMHNP   650 mg at 07/11/23 0030   alum & mag hydroxide-simeth (MAALOX/MYLANTA) 200-200-20  MG/5ML suspension 30 mL  30 mL Oral Q4H PRN Motley-Mangrum, Jadeka A, PMHNP       haloperidol  (HALDOL ) tablet 5 mg  5 mg Oral TID PRN Motley-Mangrum, Jadeka A, PMHNP   5 mg at 07/11/23 2841   And   diphenhydrAMINE  (BENADRYL ) capsule 50 mg  50 mg Oral TID PRN Motley-Mangrum, Jadeka A, PMHNP   50 mg at 07/11/23 3244   haloperidol  lactate (HALDOL ) injection 5 mg  5 mg Intramuscular TID PRN Motley-Mangrum, Jadeka A, PMHNP       And   diphenhydrAMINE  (BENADRYL ) injection 50 mg  50 mg Intramuscular TID PRN Motley-Mangrum, Jadeka A, PMHNP       And   LORazepam  (ATIVAN ) injection 2 mg  2 mg Intramuscular TID PRN Motley-Mangrum, Jadeka A, PMHNP  haloperidol  lactate (HALDOL ) injection 10 mg  10 mg Intramuscular TID PRN Motley-Mangrum, Jadeka A, PMHNP       And   diphenhydrAMINE  (BENADRYL ) injection 50 mg  50 mg Intramuscular TID PRN Motley-Mangrum, Jadeka A, PMHNP       And   LORazepam  (ATIVAN ) injection 2 mg  2 mg Intramuscular TID PRN Motley-Mangrum, Jadeka A, PMHNP       divalproex  (DEPAKOTE  ER) 24 hr tablet 500 mg  500 mg Oral Daily Motley-Mangrum, Jadeka A, PMHNP   500 mg at 07/12/23 0815   feeding supplement (ENSURE ENLIVE / ENSURE PLUS) liquid 237 mL  237 mL Oral BID BM Attiah, Nadir, MD   237 mL at 07/12/23 0815   hydrOXYzine  (ATARAX ) tablet 25 mg  25 mg Oral TID PRN Motley-Mangrum, Jadeka A, PMHNP   25 mg at 07/11/23 0030   LORazepam  (ATIVAN ) tablet 1 mg  1 mg Oral BID Kapono Luhn S, DO   1 mg at 07/12/23 0815   magnesium  hydroxide (MILK OF MAGNESIA) suspension 30 mL  30 mL Oral Daily PRN Motley-Mangrum, Jadeka A, PMHNP       nicotine  polacrilex (NICORETTE ) gum 2 mg  2 mg Oral Q4H while awake Motley-Mangrum, Jadeka A, PMHNP   2 mg at 07/12/23 0815   risperiDONE  (RISPERDAL  M-TABS) disintegrating tablet 2 mg  2 mg Oral BID Ridley Dileo S, DO       traZODone  (DESYREL ) tablet 50 mg  50 mg Oral QHS PRN Ajibola, Ene A, NP        Lab Results:  Results for orders placed or  performed during the hospital encounter of 07/10/23 (from the past 48 hours)  Lipid panel     Status: None   Collection Time: 07/12/23  6:33 AM  Result Value Ref Range   Cholesterol 126 0 - 200 mg/dL   Triglycerides 48 <962 mg/dL   HDL 44 >95 mg/dL   Total CHOL/HDL Ratio 2.9 RATIO   VLDL 10 0 - 40 mg/dL   LDL Cholesterol 72 0 - 99 mg/dL    Comment:        Total Cholesterol/HDL:CHD Risk Coronary Heart Disease Risk Table                     Men   Women  1/2 Average Risk   3.4   3.3  Average Risk       5.0   4.4  2 X Average Risk   9.6   7.1  3 X Average Risk  23.4   11.0        Use the calculated Patient Ratio above and the CHD Risk Table to determine the patient's CHD Risk.        ATP III CLASSIFICATION (LDL):  <100     mg/dL   Optimal  284-132  mg/dL   Near or Above                    Optimal  130-159  mg/dL   Borderline  440-102  mg/dL   High  >725     mg/dL   Very High Performed at [facility], [address] [place], [place] [address]   TSH     Status: None   Collection Time: 07/12/23  6:33 AM  Result Value Ref Range   TSH 1.072 0.350 - 4.500 uIU/mL    Comment: Performed by a 3rd Generation assay with a functional sensitivity of <=0.01 uIU/mL. Performed at  [facility], [address], [place], [place] [address]   Comprehensive metabolic panel with GFR     Status: Abnormal   Collection Time: 07/12/23  6:33 AM  Result Value Ref Range   Sodium 138 135 - 145 mmol/L   Potassium 4.1 3.5 - 5.1 mmol/L   Chloride 104 98 - 111 mmol/L   CO2 26 22 - 32 mmol/L   Glucose, Bld 99 70 - 99 mg/dL    Comment: Glucose reference range applies only to samples taken after fasting for at least 8 hours.   BUN 9 6 - 20 mg/dL   Creatinine, Ser 6.43 0.61 - 1.24 mg/dL   Calcium 9.3 8.9 - 32.9 mg/dL   Total Protein 8.2 (H) 6.5 - 8.1 g/dL   Albumin 4.6 3.5 - 5.0 g/dL   AST 17 15 - 41 U/L   ALT 18 0 - 44 U/L   Alkaline Phosphatase 48 38 - 126 U/L    Total Bilirubin 1.1 0.0 - 1.2 mg/dL   GFR, Estimated >51 >88 mL/min    Comment: (NOTE) Calculated using the CKD-EPI Creatinine Equation (2021)    Anion gap 8 5 - 15    Comment: Performed at [facility], [address], [place], [place] [address]    Blood Alcohol level:  Lab Results  Component Value Date   [facility] <15 07/10/2023   ETH <10 09/24/2021    Metabolic Disorder Labs: Lab Results  Component Value Date   HGBA1C 5.4 09/24/2021   MPG 108.28 09/24/2021   MPG 114 06/09/2020   Lab Results  Component Value Date   PROLACTIN 4.4 06/09/2020   Lab Results  Component Value Date   CHOL 126 07/12/2023   TRIG 48 07/12/2023   HDL 44 07/12/2023   CHOLHDL 2.9 07/12/2023   VLDL 10 07/12/2023   LDLCALC 72 07/12/2023   LDLCALC 81 09/24/2021    Physical Findings: AIMS:  , ,  ,  ,    CIWA:    COWS:     Musculoskeletal: Strength & Muscle Tone: within normal limits Gait & Station: normal Patient leans: N/A  Psychiatric Specialty Exam:  Presentation  General Appearance:  Casual  Eye Contact: Fair  Speech: Clear and Coherent  Speech Volume: Normal  Handedness: Ambidextrous   Mood and Affect  Mood: Euphoric; Anxious  Affect: Inappropriate; Labile   Thought Process  Thought Processes: Irrevelant; Disorganized  Descriptions of Associations:Tangential  Orientation:Full (Time, Place and Person)  Thought Content:Tangential  History of Schizophrenia/Schizoaffective disorder:No data recorded Duration of Psychotic Symptoms:No data recorded Hallucinations:Hallucinations: None  Ideas of Reference:None  Suicidal Thoughts:Suicidal Thoughts: No  Homicidal Thoughts:Homicidal Thoughts: No   Sensorium  Memory: Immediate Fair  Judgment: Intact  Insight: Present   Executive Functions  Concentration: Poor  Attention Span: Poor  Recall: Poor  Fund of Knowledge: Poor  Language: Fair   Psychomotor Activity  Psychomotor  Activity: Psychomotor Activity: Normal   Assets  Assets: Physical Health; Resilience   Sleep  Sleep: Sleep: Good Number of Hours of Sleep: 7    Physical Exam: Physical Exam Vitals and nursing note reviewed.  Constitutional:      General: He is not in acute distress.    Appearance: Normal appearance. He is normal weight. He is not ill-appearing or toxic-appearing.  HENT:     Head: Normocephalic and atraumatic.  Pulmonary:     Effort: Pulmonary effort is normal.  Musculoskeletal:        General: Normal range of  motion.  Neurological:     General: No focal deficit present.     Mental Status: He is alert.    Review of Systems  Respiratory:  Negative for cough and shortness of breath.   Cardiovascular:  Negative for chest pain.  Gastrointestinal:  Negative for abdominal pain, constipation, diarrhea, nausea and vomiting.  Neurological:  Negative for dizziness, weakness and headaches.  Psychiatric/Behavioral:  Negative for depression, hallucinations and suicidal ideas. The patient is not nervous/anxious.    Blood pressure (!) 142/96, pulse 94, temperature (!) 97.4 F (36.3 C), temperature source Oral, resp. rate 18, height 5\' 9"  (1.753 m), weight 63 kg, SpO2 98%. Body mass index is 20.53 kg/m.   Treatment Plan Summary: Daily contact with patient to assess and evaluate symptoms and progress in treatment and Medication management  Jay Woods is a 23 yr Jay male who presented on 5/17 to [facility] via law enforcement under IVC for erratic aggressive behavior towards his family, he was admitted to [facility] on 5/18.  PPHx is significant for Schizoaffective Disorder, GAD, IED, Polysubstance Abuse (Benzo, Meth, THC), and 4 Prior Psychiatric Hospitalizations (last- Jay Woods 07/2022), and no history of Suicide Attempts or Self Injurious Behavior.     Jay Woods continues to be manic- disorganized/tangential thoughts and constantly moving around.  We will further increase his Risperdal   to address.  We will obtain a Depakote  lvl tomorrow morning so we can further increase his Depakote .  Lab work taken this morning shows that his Potasium has normalized.  We will continue to monitor.      Schizoaffective Disorder, Bipolar Type, current episode Manic  IED: -Increase Risperdal  to 1 mg BID this evening for mood stability and psychosis -Continue Depakote  ER 500 mg daily for mood stability -Start Ativan  1 mg BID for mood stability for 2 days -Continue Agitation Protocol: Haldol /Ativan /Benadryl      Nicotine  Dependence: -Continue Nicotine  Gum 2 mg q4 PRN     -Continue Ensure 237 mL BID -Continue PRN's: Tylenol , Maalox, Atarax , Milk of Magnesia   --  The risks/benefits/side-effects/alternatives to medications were discussed in detail with the patient and time was given for questions. The patient consents to medication trials.                -- Metabolic profile and EKG monitoring obtained while on an atypical antipsychotic (BMI: Lipid Panel: HbgA1c: QTc:)              -- Encouraged patient to participate in unit milieu and in scheduled group therapies              -- Short Term Goals: Ability to identify changes in lifestyle to reduce recurrence of condition will improve, Ability to verbalize feelings will improve, Ability to disclose and discuss suicidal ideas, Ability to demonstrate self-control will improve, Ability to identify and develop effective coping behaviors will improve, Ability to maintain clinical measurements within normal limits will improve, Compliance with prescribed medications will improve, and Ability to identify triggers associated with substance abuse/mental health issues will improve             -- Long Term Goals: Improvement in symptoms so as ready for discharge   Safety and Monitoring:             -- Involuntary admission to inpatient psychiatric unit for safety, stabilization and treatment             -- Daily contact with patient to assess and  evaluate symptoms and progress in treatment             --  Patient's case to be discussed in multi-disciplinary team meeting             -- Observation Level : q15 minute checks             -- Vital signs:  q12 hours             -- Precautions: suicide, elopement, and assault  Discharge Planning:              -- Social work and case management to assist with discharge planning and identification of hospital follow-up needs prior to discharge             -- Estimated LOS: 5-6 more days             -- Discharge Concerns: Need to establish a safety plan; Medication compliance and effectiveness             -- Discharge Goals: Return home with outpatient referrals for mental health follow-up including medication management/psychotherapy   Basilia Bosworth, DO 07/12/2023, 11:37 AM

## 2023-07-12 NOTE — Plan of Care (Signed)
  Problem: Coping: Goal: Ability to verbalize frustrations and anger appropriately will improve Outcome: Progressing   Problem: Safety: Goal: Periods of time without injury will increase Outcome: Progressing   Problem: Physical Regulation: Goal: Ability to maintain clinical measurements within normal limits will improve Outcome: Progressing

## 2023-07-12 NOTE — Group Note (Signed)
 Recreation Therapy Group Note   Group Topic:Stress Management  Group Date: 07/12/2023 Start Time: 0935 End Time: 1000 Facilitators: Jakyla Reza-McCall, LRT,CTRS Location: [address] Dayroom   Group Topic: Stress Management   Goal Area(s) Addresses:  Patient will actively participate in stress management techniques presented during session.  Patient will successfully identify benefit of practicing stress management post d/c.   Behavioral Response: N/A  Intervention: Relaxation exercise with ambient sound and script   Activity: Guided Imagery. LRT provided education, instruction, and demonstration on practice of visualization via guided imagery. Patient was asked to participate in the technique introduced during session. LRT debriefed including topics of mindfulness, stress management and specific scenarios each patient could use these techniques. Patients were given suggestions of ways to access scripts post d/c and encouraged to explore Youtube and other apps available on smartphones, tablets, and computers.  Education:  Stress Management, Discharge Planning.   Education Outcome: Acknowledges education  Clinical Observations/Feedback: Patient actively engaged in technique introduced, expressed no concerns and demonstrated ability to practice skill independently post d/c.    Affect/Mood: N/A   Participation Level: Did not attend    Clinical Observations/Individualized Feedback:     Plan: Continue to engage patient in RT group sessions 2-3x/week.   Talib Headley-McCall, LRT,CTRS 07/12/2023 12:01 PM

## 2023-07-12 NOTE — Progress Notes (Signed)
   07/12/23 0800  Psych Admission Type (Psych Patients Only)  Admission Status Involuntary  Psychosocial Assessment  Patient Complaints Anxiety  Eye Contact Fair  Facial Expression Anxious;Animated  Affect Euphoric  Furniture conservator/restorer  Appearance/Hygiene In scrubs  Behavior Characteristics Cooperative;Appropriate to situation  Mood Anxious;Preoccupied  Aggressive Behavior  Effect No apparent injury  Thought Process  Coherency Circumstantial  Content Blaming others  Delusions None reported or observed  Perception WDL  Hallucination None reported or observed  Judgment Impaired  Confusion WDL  Danger to Self  Current suicidal ideation? Denies  Self-Injurious Behavior No self-injurious ideation or behavior indicators observed or expressed   Agreement Not to Harm Self Yes  Description of Agreement verbal  Danger to Others  Danger to Others None reported or observed

## 2023-07-12 NOTE — BH IP Treatment Plan (Signed)
 Interdisciplinary Treatment and Diagnostic Plan Update  07/12/2023 Time of Session: 1042 Jay Woods MRN: [id number]  Principal Diagnosis: Schizoaffective disorder, bipolar type (HCC)  Secondary Diagnoses: Principal Problem:   Schizoaffective disorder, bipolar type (HCC)   Current Medications:  Current Facility-Administered Medications  Medication Dose Route Frequency Provider Last Rate Last Admin   acetaminophen  (TYLENOL ) tablet 650 mg  650 mg Oral Q6H PRN Motley-Mangrum, Jadeka A, PMHNP   650 mg at 07/11/23 0030   alum & mag hydroxide-simeth (MAALOX/MYLANTA) 200-200-20 MG/5ML suspension 30 mL  30 mL Oral Q4H PRN Motley-Mangrum, Jadeka A, PMHNP       haloperidol  (HALDOL ) tablet 5 mg  5 mg Oral TID PRN Motley-Mangrum, Jadeka A, PMHNP   5 mg at 07/11/23 4098   And   diphenhydrAMINE  (BENADRYL ) capsule 50 mg  50 mg Oral TID PRN Motley-Mangrum, Jadeka A, PMHNP   50 mg at 07/11/23 1191   haloperidol  lactate (HALDOL ) injection 5 mg  5 mg Intramuscular TID PRN Motley-Mangrum, Jadeka A, PMHNP       And   diphenhydrAMINE  (BENADRYL ) injection 50 mg  50 mg Intramuscular TID PRN Motley-Mangrum, Jadeka A, PMHNP       And   LORazepam  (ATIVAN ) injection 2 mg  2 mg Intramuscular TID PRN Motley-Mangrum, Jadeka A, PMHNP       haloperidol  lactate (HALDOL ) injection 10 mg  10 mg Intramuscular TID PRN Motley-Mangrum, Jadeka A, PMHNP       And   diphenhydrAMINE  (BENADRYL ) injection 50 mg  50 mg Intramuscular TID PRN Motley-Mangrum, Jadeka A, PMHNP       And   LORazepam  (ATIVAN ) injection 2 mg  2 mg Intramuscular TID PRN Motley-Mangrum, Jadeka A, PMHNP       divalproex  (DEPAKOTE  ER) 24 hr tablet 500 mg  500 mg Oral Daily Motley-Mangrum, Jadeka A, PMHNP   500 mg at 07/12/23 0815   feeding supplement (ENSURE ENLIVE / ENSURE PLUS) liquid 237 mL  237 mL Oral BID BM Attiah, Nadir, MD   237 mL at 07/12/23 1534   hydrOXYzine  (ATARAX ) tablet 25 mg  25 mg Oral TID PRN Motley-Mangrum, Jadeka A, PMHNP   25 mg  at 07/11/23 0030   LORazepam  (ATIVAN ) tablet 1 mg  1 mg Oral BID Pashayan, Alexander S, DO   1 mg at 07/12/23 4782   magnesium  hydroxide (MILK OF MAGNESIA) suspension 30 mL  30 mL Oral Daily PRN Motley-Mangrum, Jadeka A, PMHNP       nicotine  polacrilex (NICORETTE ) gum 2 mg  2 mg Oral Q4H while awake Motley-Mangrum, Jadeka A, PMHNP   2 mg at 07/12/23 1533   risperiDONE  (RISPERDAL  M-TABS) disintegrating tablet 2 mg  2 mg Oral BID Pashayan, Alexander S, DO       traZODone  (DESYREL ) tablet 50 mg  50 mg Oral QHS PRN Ajibola, Ene A, NP       PTA Medications: No medications prior to admission.    Patient Stressors:    Patient Strengths:    Treatment Modalities: Medication Management, Group therapy, Case management,  1 to 1 session with clinician, Psychoeducation, Recreational therapy.   Physician Treatment Plan for Primary Diagnosis: Schizoaffective disorder, bipolar type (HCC) Long Term Goal(s): Improvement in symptoms so as ready for discharge   Short Term Goals: Ability to identify changes in lifestyle to reduce recurrence of condition will improve Ability to maintain clinical measurements within normal limits will improve Compliance with prescribed medications will improve Ability to identify triggers associated with substance abuse/mental health issues will improve  Medication Management: Evaluate patient's response, side effects, and tolerance of medication regimen.  Therapeutic Interventions: 1 to 1 sessions, Unit Group sessions and Medication administration.  Evaluation of Outcomes: Not Progressing  Physician Treatment Plan for Secondary Diagnosis: Principal Problem:   Schizoaffective disorder, bipolar type (HCC)  Long Term Goal(s): Improvement in symptoms so as ready for discharge   Short Term Goals: Ability to identify changes in lifestyle to reduce recurrence of condition will improve Ability to maintain clinical measurements within normal limits will improve Compliance  with prescribed medications will improve Ability to identify triggers associated with substance abuse/mental health issues will improve     Medication Management: Evaluate patient's response, side effects, and tolerance of medication regimen.  Therapeutic Interventions: 1 to 1 sessions, Unit Group sessions and Medication administration.  Evaluation of Outcomes: Not Progressing   RN Treatment Plan for Primary Diagnosis: Schizoaffective disorder, bipolar type (HCC) Long Term Goal(s): Knowledge of disease and therapeutic regimen to maintain health will improve  Short Term Goals: Ability to remain free from injury will improve, Ability to verbalize frustration and anger appropriately will improve, Ability to demonstrate self-control, Ability to participate in decision making will improve, Ability to verbalize feelings will improve, Ability to disclose and discuss suicidal ideas, Ability to identify and develop effective coping behaviors will improve, and Compliance with prescribed medications will improve  Medication Management: RN will administer medications as ordered by provider, will assess and evaluate patient's response and provide education to patient for prescribed medication. RN will report any adverse and/or side effects to prescribing provider.  Therapeutic Interventions: 1 on 1 counseling sessions, Psychoeducation, Medication administration, Evaluate responses to treatment, Monitor vital signs and CBGs as ordered, Perform/monitor CIWA, COWS, AIMS and Fall Risk screenings as ordered, Perform wound care treatments as ordered.  Evaluation of Outcomes: Not Progressing   LCSW Treatment Plan for Primary Diagnosis: Schizoaffective disorder, bipolar type (HCC) Long Term Goal(s): Safe transition to appropriate next level of care at discharge, Engage patient in therapeutic group addressing interpersonal concerns.  Short Term Goals: Engage patient in aftercare planning with referrals and  resources, Increase social support, Increase ability to appropriately verbalize feelings, Increase emotional regulation, Facilitate acceptance of mental health diagnosis and concerns, Identify triggers associated with mental health/substance abuse issues, and Increase skills for wellness and recovery  Therapeutic Interventions: Assess for all discharge needs, 1 to 1 time with Social worker, Explore available resources and support systems, Assess for adequacy in community support network, Educate family and significant other(s) on suicide prevention, Complete Psychosocial Assessment, Interpersonal group therapy.  Evaluation of Outcomes: Not Progressing   Progress in Treatment: Attending groups: Yes. Participating in groups: Yes. Taking medication as prescribed: Yes. Toleration medication: Yes. Family/Significant other contact made: No, will contact:  Jay Woods ([phone] Patient understands diagnosis: Yes. Discussing patient identified problems/goals with staff: Yes. Medical problems stabilized or resolved: Yes. Denies suicidal/homicidal ideation: Yes. Issues/concerns per patient self-inventory: No. Other: n/a  New problem(s) identified: No, Describe:  None  New Short Term/Long Term Goal(s): medication stabilization, elimination of SI thoughts, development of comprehensive mental wellness plan.   Patient Goals:  "Calm down, be calm."  Discharge Plan or Barriers: Patient recently admitted. CSW will continue to follow and assess for appropriate referrals and possible discharge planning.    Reason for Continuation of Hospitalization: Anxiety Mania Medication stabilization Other; describe mood stabilization, discharge planning  Estimated Length of Stay:  Last 3 [place] Suicide Severity Risk Score: Flowsheet Row [facility] (Current[facility] Most recent reading at 07/10/2023  3:00 PM ED from 07/10/2023 in [facility] Most recent reading at 07/10/2023  2:46 AM UC from 03/22/2023 in [facility] [place] Most recent reading at 03/22/2023  6:12 PM  C-SSRS RISK CATEGORY No Risk No Risk No Risk       Last PHQ 2/9 Scores:    10/06/2021   10:12 AM 10/06/2021    8:34 AM  Depression screen PHQ 2/9  Decreased Interest 0 1  Down, Depressed, Hopeless 0 3  PHQ - 2 Score 0 4  Altered sleeping 0 0  Tired, decreased energy 0 1  Change in appetite 0 1  Trouble concentrating 0 3  Moving slowly or fidgety/restless 0 3  Suicidal thoughts 0 0  PHQ-9 Score 0 12  Difficult doing work/chores  Not difficult at all    Scribe for Treatment Team: Noah Lembke N Chaz Ronning, LCSW 07/12/2023 5:00 PM

## 2023-07-12 NOTE — [facility] Group Notes (Signed)
 Adult Psychoeducational Group Note  Date:  07/12/2023 Time:  10:56 AM  Group Topic/Focus:  Goals Group:   The focus of this group is to help patients establish daily goals to achieve during treatment and discuss how the patient can incorporate goal setting into their daily lives to aide in recovery.  Participation Level:  Active  Participation Quality:  Appropriate and Attentive  Affect:  Appropriate  Cognitive:  Appropriate  Insight: Appropriate  Engagement in Group:  Engaged  Modes of Intervention:  Exploration  Additional Comments:  Pt participated in goals group. Pt stated his goal is think more positive and learn to process situations. Pt identified no SI/HI  Alean Amen 07/12/2023, 10:56 AM

## 2023-07-13 LAB — VALPROIC ACID LEVEL: Valproic Acid Lvl: 25 ug/mL — ABNORMAL LOW (ref 50–100)

## 2023-07-13 NOTE — Plan of Care (Signed)
  Problem: Activity: Goal: Interest or engagement in activities will improve Outcome: Progressing   Problem: Coping: Goal: Ability to verbalize frustrations and anger appropriately will improve Outcome: Progressing   Problem: Education: Goal: Knowledge of [facility] Education information/materials will improve Outcome: Progressing

## 2023-07-13 NOTE — Group Note (Signed)
 Date:  07/13/2023 Time:  9:43 PM  Group Topic/Focus:  Wrap-Up Group:   The focus of this group is to help patients review their daily goal of treatment and discuss progress on daily workbooks.    Additional Comments:  Pt was encouraged, but opted out of attending wrap up group this evening.   Eligah Grow 07/13/2023, 9:43 PM

## 2023-07-13 NOTE — Group Note (Signed)
 Date:  07/13/2023 Time:  10:35 AM  Group Topic/Focus:  Goals Group:   The focus of this group is to help patients establish daily goals to achieve during treatment and discuss how the patient can incorporate goal setting into their daily lives to aide in recovery.    Participation Level:  Active  Participation Quality:  Attentive  Affect:  Appropriate  Cognitive:  Appropriate  Insight: Good  Engagement in Group:  Engaged  Modes of Intervention:  Education  Additional Comments:  He shared his coping skills and how it helps him to navigate through life.  Hughie Mae 07/13/2023, 10:35 AM

## 2023-07-13 NOTE — Progress Notes (Signed)
 [facility] MD Progress Note  07/13/2023 1:41 PM Jay Woods  MRN:  [id number] Subjective:   Jay Woods is a 23 yr Jay male who presented on 5/17 to [facility] via law enforcement under IVC for erratic aggressive behavior towards his family, he was admitted to [facility] on 5/18. PPHx is significant for Schizoaffective Disorder, GAD, IED, Polysubstance Abuse (Benzo, Meth, THC), and 4 Prior Psychiatric Hospitalizations (last- Jay Woods 07/2022), and no history of Suicide Attempts or Self Injurious Behavior.    Case was discussed in the multidisciplinary team. MAR was reviewed and patient was compliant with medications.  He did not require any PRN medications after his initial agitation medications he required yesterday morning.   Psychiatric Team made the following recommendations yesterday: -Increase Risperdal  to 2 mg BID this evening for mood stability and psychosis -Continue Depakote  ER 500 mg daily for mood stability -Continue Ativan  1 mg BID for mood stability for 2 days    On interview today patient reports he slept good last night.  He reports his appetite is doing good.  He reports no SI, HI, or AVH.  He reports no Paranoia or Ideas of Reference.  He reports no issues with his medications.  Discussed with him that we will await the results of his Depakote  level and then decide if we will further increase his Depakote .  He reports that he is not interested in that as he feels like his current dose is sufficient.  Discussed that Risperdal  can be given in an LAI form and asked if he would be interested in this and he reports that he is not he wants to continue taking the oral medication.  He reports no other concerns at present.    Principal Problem: Schizoaffective disorder, bipolar type (HCC) Diagnosis: Principal Problem:   Schizoaffective disorder, bipolar type (HCC)  Total Time spent with patient:  I personally spent 35 minutes on the unit in direct patient care. The direct  patient care time included face-to-face time with the patient, reviewing the patient's chart, communicating with other professionals, and coordinating care. Greater than 50% of this time was spent in counseling or coordinating care with the patient regarding goals of hospitalization, psycho-education, and discharge planning needs.   Past Psychiatric History:  Schizoaffective Disorder, GAD, IED, Polysubstance Abuse (Benzo, Meth, THC), and 4 Prior Psychiatric Hospitalizations (last- Jay Woods 07/2022), and no history of Suicide Attempts or Self Injurious Behavior.   Past Medical History:  Past Medical History:  Diagnosis Date   Eczema    Schizoaffective disorder (HCC)    History reviewed. No pertinent surgical history. Family History:  Family History  Problem Relation Age of Onset   Healthy Mother    Healthy Father    Family Psychiatric  History:  He reports possible family diagnosis' Reports No Known Suicides or Substance Abuse   Social History:  Social History   Substance and Sexual Activity  Alcohol Use Not Currently   Alcohol/week: 1.0 standard drink of alcohol   Types: 1 Cans of beer per week   Comment: last use 09/30/21     Social History   Substance and Sexual Activity  Drug Use Not Currently   Comment: Hx of cocaine, marijuana, k2, Xanax, and Molly on and off for the past 5 years    Social History   Socioeconomic History   Marital status: Single    Spouse name: Not on file   Number of children: Not on file   Years of education: Not on file  Highest education level: Not on file  Occupational History   Not on file  Tobacco Use   Smoking status: Former    Current packs/day: 0.25    Average packs/day: 0.3 packs/day for 5.0 years (1.3 ttl pk-yrs)    Types: Cigarettes   Smokeless tobacco: Former  Building services engineer status: Every Day   Substances: CBD  Substance and Sexual Activity   Alcohol use: Not Currently    Alcohol/week: 1.0 standard drink of alcohol     Types: 1 Cans of beer per week    Comment: last use 09/30/21   Drug use: Not Currently    Comment: Hx of cocaine, marijuana, k2, Xanax, and Molly on and off for the past 5 years   Sexual activity: Yes    Partners: Female    Birth control/protection: None  Other Topics Concern   Not on file  Social History Narrative   Not on file   Social Drivers of Health   Financial Resource Strain: High Risk (10/06/2021)   Overall Financial Resource Strain (CARDIA)    Difficulty of Paying Living Expenses: Very hard  Food Insecurity: Food Insecurity Present (07/10/2023)   Hunger Vital Sign    Worried About Running Out of Food in the Last Year: Sometimes true    Ran Out of Food in the Last Year: Sometimes true  Transportation Needs: Unmet Transportation Needs (07/10/2023)   PRAPARE - Transportation    Lack of Transportation (Medical): Yes    Lack of Transportation (Non-Medical): Yes  Physical Activity: Sufficiently Active (10/06/2021)   Exercise Vital Sign    Days of Exercise per Week: 7 days    Minutes of Exercise per Session: 30 min  Stress: No Stress Concern Present (10/06/2021)   [employer] of Occupational Health - Occupational Stress Questionnaire    Feeling of Stress : Only a little  Social Connections: Socially Integrated (10/06/2021)   Social Connection and Isolation Panel [NHANES]    Frequency of Communication with Friends and Family: More than three times a week    Frequency of Social Gatherings with Friends and Family: More than three times a week    Attends Religious Services: More than 4 times per year    Active Member of [employer] or Organizations: No    Attends Engineer, structural: More than 4 times per year    Marital Status: Living with partner   Additional Social History:                         Sleep: Good  Appetite:  Good  Current Medications: Current Facility-Administered Medications  Medication Dose Route Frequency Provider Last Rate Last Admin    acetaminophen  (TYLENOL ) tablet 650 mg  650 mg Oral Q6H PRN Motley-Mangrum, Jadeka A, PMHNP   650 mg at 07/11/23 0030   alum & mag hydroxide-simeth (MAALOX/MYLANTA) 200-200-20 MG/5ML suspension 30 mL  30 mL Oral Q4H PRN Motley-Mangrum, Jadeka A, PMHNP       haloperidol  (HALDOL ) tablet 5 mg  5 mg Oral TID PRN Motley-Mangrum, Jadeka A, PMHNP   5 mg at 07/11/23 1914   And   diphenhydrAMINE  (BENADRYL ) capsule 50 mg  50 mg Oral TID PRN Motley-Mangrum, Jadeka A, PMHNP   50 mg at 07/11/23 7829   haloperidol  lactate (HALDOL ) injection 5 mg  5 mg Intramuscular TID PRN Motley-Mangrum, Jadeka A, PMHNP       And   diphenhydrAMINE  (BENADRYL ) injection 50 mg  50 mg Intramuscular  TID PRN Motley-Mangrum, Jadeka A, PMHNP       And   LORazepam  (ATIVAN ) injection 2 mg  2 mg Intramuscular TID PRN Motley-Mangrum, Jadeka A, PMHNP       haloperidol  lactate (HALDOL ) injection 10 mg  10 mg Intramuscular TID PRN Motley-Mangrum, Jadeka A, PMHNP       And   diphenhydrAMINE  (BENADRYL ) injection 50 mg  50 mg Intramuscular TID PRN Motley-Mangrum, Jadeka A, PMHNP       And   LORazepam  (ATIVAN ) injection 2 mg  2 mg Intramuscular TID PRN Motley-Mangrum, Jadeka A, PMHNP       divalproex  (DEPAKOTE  ER) 24 hr tablet 500 mg  500 mg Oral Daily Motley-Mangrum, Jadeka A, PMHNP   500 mg at 07/13/23 0830   feeding supplement (ENSURE ENLIVE / ENSURE PLUS) liquid 237 mL  237 mL Oral BID BM Attiah, Nadir, MD   237 mL at 07/13/23 0830   hydrOXYzine  (ATARAX ) tablet 25 mg  25 mg Oral TID PRN Motley-Mangrum, Jadeka A, PMHNP   25 mg at 07/12/23 2149   magnesium  hydroxide (MILK OF MAGNESIA) suspension 30 mL  30 mL Oral Daily PRN Motley-Mangrum, Jadeka A, PMHNP       nicotine  polacrilex (NICORETTE ) gum 2 mg  2 mg Oral Q4H while awake Motley-Mangrum, Jadeka A, PMHNP   2 mg at 07/13/23 0830   risperiDONE  (RISPERDAL  M-TABS) disintegrating tablet 2 mg  2 mg Oral BID Edwyna Dangerfield S, DO   2 mg at 07/13/23 1308   traZODone  (DESYREL ) tablet 50 mg   50 mg Oral QHS PRN Ajibola, Ene A, NP        Lab Results:  Results for orders placed or performed during the hospital encounter of 07/10/23 (from the past 48 hours)  Lipid panel     Status: None   Collection Time: 07/12/23  6:33 AM  Result Value Ref Range   Cholesterol 126 0 - 200 mg/dL   Triglycerides 48 <657 mg/dL   HDL 44 >84 mg/dL   Total CHOL/HDL Ratio 2.9 RATIO   VLDL 10 0 - 40 mg/dL   LDL Cholesterol 72 0 - 99 mg/dL    Comment:        Total Cholesterol/HDL:CHD Risk Coronary Heart Disease Risk Table                     Men   Women  1/2 Average Risk   3.4   3.3  Average Risk       5.0   4.4  2 X Average Risk   9.6   7.1  3 X Average Risk  23.4   11.0        Use the calculated Patient Ratio above and the CHD Risk Table to determine the patient's CHD Risk.        ATP III CLASSIFICATION (LDL):  <100     mg/dL   Optimal  696-295  mg/dL   Near or Above                    Optimal  130-159  mg/dL   Borderline  284-132  mg/dL   High  >440     mg/dL   Very High Performed at [facility], [address] [place], [place] [address]   Hemoglobin A1c     Status: None   Collection Time: 07/12/23  6:33 AM  Result Value Ref Range   Hgb A1c MFr Bld 5.0 4.8 - 5.6 %  Comment: (NOTE) Pre diabetes:          5.7%-6.4%  Diabetes:              >6.4%  Glycemic control for   <7.0% adults with diabetes    Mean Plasma Glucose 96.8 mg/dL    Comment: Performed at [facility], [address] [place], [place] [address]  TSH     Status: None   Collection Time: 07/12/23  6:33 AM  Result Value Ref Range   TSH 1.072 0.350 - 4.500 uIU/mL    Comment: Performed by a 3rd Generation assay with a functional sensitivity of <=0.01 uIU/mL. Performed at [facility], [address] [place], [place] [address]   Comprehensive metabolic panel with GFR     Status: Abnormal   Collection Time: 07/12/23  6:33 AM  Result Value Ref Range   Sodium 138 135 -  145 mmol/L   Potassium 4.1 3.5 - 5.1 mmol/L   Chloride 104 98 - 111 mmol/L   CO2 26 22 - 32 mmol/L   Glucose, Bld 99 70 - 99 mg/dL    Comment: Glucose reference range applies only to samples taken after fasting for at least 8 hours.   BUN 9 6 - 20 mg/dL   Creatinine, Ser 8.29 0.61 - 1.24 mg/dL   Calcium 9.3 8.9 - 56.2 mg/dL   Total Protein 8.2 (H) 6.5 - 8.1 g/dL   Albumin 4.6 3.5 - 5.0 g/dL   AST 17 15 - 41 U/L   ALT 18 0 - 44 U/L   Alkaline Phosphatase 48 38 - 126 U/L   Total Bilirubin 1.1 0.0 - 1.2 mg/dL   GFR, Estimated >13 >08 mL/min    Comment: (NOTE) Calculated using the CKD-EPI Creatinine Equation (2021)    Anion gap 8 5 - 15    Comment: Performed at [facility], [address] [place], [place] [address]  Valproic acid  level     Status: Abnormal   Collection Time: 07/13/23  6:16 AM  Result Value Ref Range   Valproic Acid  Lvl 25 (L) 50 - 100 ug/mL    Comment: Performed at [facility], [address], [place], [place] [address]    Blood Alcohol level:  Lab Results  Component Value Date   [facility] <15 07/10/2023   ETH <10 09/24/2021    Metabolic Disorder Labs: Lab Results  Component Value Date   HGBA1C 5.0 07/12/2023   MPG 96.8 07/12/2023   MPG 108.28 09/24/2021   Lab Results  Component Value Date   PROLACTIN 4.4 06/09/2020   Lab Results  Component Value Date   CHOL 126 07/12/2023   TRIG 48 07/12/2023   HDL 44 07/12/2023   CHOLHDL 2.9 07/12/2023   VLDL 10 07/12/2023   LDLCALC 72 07/12/2023   LDLCALC 81 09/24/2021    Physical Findings: AIMS:  , ,  ,  ,    CIWA:    COWS:     Musculoskeletal: Strength & Muscle Tone: within normal limits Gait & Station: normal Patient leans: N/A  Psychiatric Specialty Exam:  Presentation  General Appearance:  Casual  Eye Contact: Fair  Speech: Clear and Coherent  Speech Volume: Normal  Handedness: Ambidextrous   Mood and Affect   Mood: Euphoric  Affect: Labile; Full Range   Thought Process  Thought Processes: Other (comment) (mix of disorganized and coherent)  Descriptions of Associations:Loose  Orientation:Full (Time, Place and Person)  Thought Content:Other (comment) (mix  of WDL and scarttered)  History of Schizophrenia/Schizoaffective disorder:No data recorded Duration of Psychotic Symptoms:No data recorded Hallucinations:Hallucinations: None  Ideas of Reference:None  Suicidal Thoughts:Suicidal Thoughts: No  Homicidal Thoughts:Homicidal Thoughts: No   Sensorium  Memory: Immediate Fair  Judgment: Intact  Insight: Present   Executive Functions  Concentration: Poor  Attention Span: Poor  Recall: Poor  Fund of Knowledge: Poor  Language: Fair   Psychomotor Activity  Psychomotor Activity: Psychomotor Activity: Restlessness   Assets  Assets: Physical Health; Resilience   Sleep  Sleep: Sleep: Good Number of Hours of Sleep: 7.75    Physical Exam: Physical Exam Vitals and nursing note reviewed.  Constitutional:      General: He is not in acute distress.    Appearance: Normal appearance. He is normal weight. He is not ill-appearing or toxic-appearing.  HENT:     Head: Normocephalic and atraumatic.  Pulmonary:     Effort: Pulmonary effort is normal.  Musculoskeletal:        General: Normal range of motion.  Neurological:     General: No focal deficit present.     Mental Status: He is alert.    Review of Systems  Respiratory:  Negative for cough and shortness of breath.   Cardiovascular:  Negative for chest pain.  Gastrointestinal:  Negative for abdominal pain, constipation, diarrhea, nausea and vomiting.  Neurological:  Negative for dizziness, weakness and headaches.  Psychiatric/Behavioral:  Negative for depression, hallucinations and suicidal ideas. The patient is not nervous/anxious.    Blood pressure (!) 147/98, pulse 91, temperature 97.6 F (36.4  C), temperature source Oral, resp. rate 18, height 5\' 9"  (1.753 m), weight 63 kg, SpO2 100%. Body mass index is 20.53 kg/m.   Treatment Plan Summary: Daily contact with patient to assess and evaluate symptoms and progress in treatment and Medication management  Jay Woods is a 23 yr Jay male who presented on 5/17 to [facility] via law enforcement under IVC for erratic aggressive behavior towards his family, he was admitted to [facility] on 5/18.  PPHx is significant for Schizoaffective Disorder, GAD, IED, Polysubstance Abuse (Benzo, Meth, THC), and 4 Prior Psychiatric Hospitalizations (last- Jay Woods 07/2022), and no history of Suicide Attempts or Self Injurious Behavior.     Jay Woods continues to be manic and somewhat disorganized but is improving.  He is not interested in an LAI.  As we increased his Risperdal  last evening and he is beginning to respond to it we will not make any changes to his medications at this time.  We will continue to monitor.     Schizoaffective Disorder, Bipolar Type, current episode Manic  IED: -Continue Risperdal  2 mg BID for mood stability and psychosis -Continue Depakote  ER 500 mg daily for mood stability -Ativan  stopped yesterday -Continue Agitation Protocol: Haldol /Ativan /Benadryl      Nicotine  Dependence: -Continue Nicotine  Gum 2 mg q4 PRN     -Continue Ensure 237 mL BID -Continue PRN's: Tylenol , Maalox, Atarax , Milk of Magnesia   --  The risks/benefits/side-effects/alternatives to medications were discussed in detail with the patient and time was given for questions. The patient consents to medication trials.                -- Metabolic profile and EKG monitoring obtained while on an atypical antipsychotic (BMI: Lipid Panel: HbgA1c: QTc:)              -- Encouraged patient to participate in unit milieu and in scheduled group therapies              --  Short Term Goals: Ability to identify changes in lifestyle to reduce recurrence of condition will  improve, Ability to verbalize feelings will improve, Ability to disclose and discuss suicidal ideas, Ability to demonstrate self-control will improve, Ability to identify and develop effective coping behaviors will improve, Ability to maintain clinical measurements within normal limits will improve, Compliance with prescribed medications will improve, and Ability to identify triggers associated with substance abuse/mental health issues will improve             -- Long Term Goals: Improvement in symptoms so as ready for discharge   Safety and Monitoring:             -- Involuntary admission to inpatient psychiatric unit for safety, stabilization and treatment             -- Daily contact with patient to assess and evaluate symptoms and progress in treatment             -- Patient's case to be discussed in multi-disciplinary team meeting             -- Observation Level : q15 minute checks             -- Vital signs:  q12 hours             -- Precautions: suicide, elopement, and assault  Discharge Planning:              -- Social work and case management to assist with discharge planning and identification of hospital follow-up needs prior to discharge             -- Estimated LOS: 5-6 more days             -- Discharge Concerns: Need to establish a safety plan; Medication compliance and effectiveness             -- Discharge Goals: Return home with outpatient referrals for mental health follow-up including medication management/psychotherapy   Jay Bosworth, DO 07/13/2023, 1:41 PM

## 2023-07-13 NOTE — [facility] Group Notes (Signed)

## 2023-07-13 NOTE — Group Note (Signed)
 Recreation Therapy Group Note   Group Topic:Animal Assisted Therapy   Group Date: 07/13/2023 Start Time: 0945 End Time: 1030 Facilitators: Emalynn Clewis-McCall, LRT,CTRS Location: [address] Dayroom   Animal-Assisted Activity (AAA) Program Checklist/Progress Notes Patient Eligibility Criteria Checklist & Daily Group note for Rec Tx Intervention  AAA/T Program Assumption of Risk Form signed by Patient/ or Parent Legal Guardian Yes  Patient is free of allergies or severe asthma Yes  Patient reports no fear of animals Yes  Patient reports no history of cruelty to animals Yes  Patient understands his/her participation is voluntary Yes  Patient washes hands before animal contact Yes  Patient washes hands after animal contact Yes  Behavioral Response: Attentive   Education: Hand Washing, Appropriate Animal Interaction   Education Outcome: Acknowledges education.   Clinical Observations/Feedback: Patient attended session and interacted appropriately with therapy dog and peers. Patient asked appropriate questions about therapy dog and his training. Patient shared stories about their pets at home with group.   Jay Woods, LRT/CTRS    Affect/Mood: Appropriate   Participation Level: Active   Participation Quality: Independent   Behavior: Appropriate   Speech/Thought Process: Focused   Insight: Moderate   Judgement: Moderate   Modes of Intervention: Teaching laboratory technician   Patient Response to Interventions:  Engaged   Education Outcome:  In group clarification offered    Clinical Observations/Individualized Feedback:  Patient attended session and interacted appropriately with therapy dog and peers. Patient asked appropriate questions about therapy dog and his training. Patient shared stories about their pets at home with group. Patient also made random comments and drifted off topic but was able to be redirected.    Plan: Continue to engage patient in RT  group sessions 2-3x/week.   Jay Woods, LRT,CTRS 07/13/2023 1:09 PM

## 2023-07-13 NOTE — Plan of Care (Signed)
   Problem: Education: Goal: Emotional status will improve Outcome: Progressing Goal: Mental status will improve Outcome: Progressing   Problem: Activity: Goal: Sleeping patterns will improve Outcome: Progressing

## 2023-07-13 NOTE — Progress Notes (Signed)
   07/13/23 0814  Psych Admission Type (Psych Patients Only)  Admission Status Involuntary  Psychosocial Assessment  Patient Complaints Anxiety  Eye Contact Fair  Facial Expression Anxious  Affect Euphoric  Speech Logical/coherent  Interaction Assertive  Motor Activity Slow  Appearance/Hygiene Unremarkable  Behavior Characteristics Cooperative;Appropriate to situation  Mood Anxious;Preoccupied  Aggressive Behavior  Effect No apparent injury  Thought Process  Coherency Circumstantial  Content Blaming others  Delusions None reported or observed  Perception WDL  Hallucination None reported or observed  Judgment Impaired  Confusion WDL  Danger to Self  Current suicidal ideation? Denies  Self-Injurious Behavior No self-injurious ideation or behavior indicators observed or expressed   Agreement Not to Harm Self Yes  Description of Agreement verbal  Danger to Others  Danger to Others None reported or observed

## 2023-07-13 NOTE — Progress Notes (Signed)
   07/13/23 0015  Psych Admission Type (Psych Patients Only)  Admission Status Involuntary  Psychosocial Assessment  Patient Complaints Anxiety  Eye Contact Fair  Facial Expression Anxious;Animated  Affect Euphoric  Speech Logical/coherent  Interaction Assertive  Motor Activity Slow  Appearance/Hygiene Unremarkable  Behavior Characteristics Cooperative  Mood Anxious;Preoccupied  Aggressive Behavior  Effect No apparent injury  Thought Process  Coherency Circumstantial  Content Blaming self  Delusions None reported or observed  Perception WDL  Hallucination None reported or observed  Judgment Impaired  Confusion WDL  Danger to Self  Current suicidal ideation? Denies  Danger to Others  Danger to Others None reported or observed

## 2023-07-13 NOTE — Group Note (Signed)
 LCSW Group Therapy Note   Group Date: 07/13/2023 Start Time: 1100 End Time: 1200   Participation:  patient was present and actively participated in the discussion.  He was helpful, when he picked up print-outs.  Type of Therapy:  Group Therapy  Topic:  "Healing Flames: Navigating Anger with Compassion"  Objective:  Foster self-awareness and promote compassion toward oneself and others when dealing with anger.  Goals:  Help participants understand the underlying emotions and needs fueling anger. Provide coping strategies for healthier emotional expression and anger management.  Summary: This session explored anger as a volcano--an explosion driven by deeper feelings and unmet needs. Participants learned to identify anger triggers and underlying emotions, then practiced coping strategies like deep breathing, physical activity, and journaling. The group discussed healthy ways to manage anger before it escalates, using both personal reflection and shared experiences.  Therapeutic Modalities: Cognitive Behavioral Therapy (CBT): Challenging thoughts that fuel anger. Mindfulness: Increasing awareness of emotions and sensations.   Jay Woods Ronny Korff, LCSWA 07/13/2023  12:49 PM

## 2023-07-14 MED ORDER — DIVALPROEX SODIUM ER 500 MG PO TB24
1000.0000 mg | ORAL_TABLET | Freq: Every day | ORAL | Status: DC
  Administered 2023-07-15 – 2023-07-17 (×3): 1000 mg via ORAL
  Filled 2023-07-14 (×3): qty 2

## 2023-07-14 MED ORDER — DIVALPROEX SODIUM ER 500 MG PO TB24
500.0000 mg | ORAL_TABLET | Freq: Once | ORAL | Status: AC
  Administered 2023-07-14: 500 mg via ORAL
  Filled 2023-07-14: qty 1

## 2023-07-14 NOTE — Progress Notes (Signed)
 [facility] MD Progress Note  07/14/2023 10:03 AM Jay Woods  MRN:  [id number] Subjective:   Jay Woods is a 23 yr old male who presented on 5/17 to [facility] via law enforcement under IVC for erratic aggressive behavior towards his family, he was admitted to [facility] on 5/18. PPHx is significant for Schizoaffective Disorder, GAD, IED, Polysubstance Abuse (Benzo, Meth, THC), and 4 Prior Psychiatric Hospitalizations (last- [name] 07/2022), and no history of Suicide Attempts or Self Injurious Behavior.    Case was discussed in the multidisciplinary team. MAR was reviewed and patient was compliant with medications.  He received PRN Hydroxyzine  and Trazodone  last night.   Psychiatric Team made the following recommendations yesterday: -Continue Risperdal  2 mg BID this evening for mood stability and psychosis -Continue Depakote  ER 500 mg daily for mood stability -Stop Ativan     On interview today patient reports he slept good last night.  He reports his appetite is doing good.  He reports no SI, HI, or AVH.  He reports no Paranoia or Ideas of Reference.  He reports no issues with his medications.  Discussed with him that his Depakote  level yesterday was low.  Discussed with him that we would like to increase his Depakote  and he was agreeable with that.  He requests we reach out to his mother and discussed we would.  He reports no other concerns at present.   Attempted to call patient's mother, Jay Woods, [phone] for Collateral. There was no answer.    Principal Problem: Schizoaffective disorder, bipolar type (HCC) Diagnosis: Principal Problem:   Schizoaffective disorder, bipolar type (HCC)  Total Time spent with patient:  I personally spent 35 minutes on the unit in direct patient care. The direct patient care time included face-to-face time with the patient, reviewing the patient's chart, communicating with other professionals, and coordinating care. Greater than 50% of  this time was spent in counseling or coordinating care with the patient regarding goals of hospitalization, psycho-education, and discharge planning needs.   Past Psychiatric History:  Schizoaffective Disorder, GAD, IED, Polysubstance Abuse (Benzo, Meth, THC), and 4 Prior Psychiatric Hospitalizations (last- [name]/2022), and no history of Suicide Attempts or Self Injurious Behavior.   Past Medical History:  Past Medical History:  Diagnosis Date   Eczema    Schizoaffective disorder (HCC)    History reviewed. No pertinent surgical history. Family History:  Family History  Problem Relation Age of Onset   Healthy Mother    Healthy Father    Family Psychiatric  History:  He reports possible family diagnosis' Reports No Known Suicides or Substance Abuse   Social History:  Social History   Substance and Sexual Activity  Alcohol Use Not Currently   Alcohol/week: 1.0 standard drink of alcohol   Types: 1 Cans of beer per week   Comment: last use 09/30/21     Social History   Substance and Sexual Activity  Drug Use Not Currently   Comment: Hx of cocaine, marijuana, k2, Xanax, and Molly on and off for the past 5 years    Social History   Socioeconomic History   Marital status: Single    Spouse name: Not on file   Number of children: Not on file   Years of education: Not on file   Highest education level: Not on file  Occupational History   Not on file  Tobacco Use   Smoking status: Former    Current packs/day: 0.25    Average packs/day: 0.3 packs/day for 5.0 years (  1.3 ttl pk-yrs)    Types: Cigarettes   Smokeless tobacco: Former  Building services engineer status: Every Day   Substances: CBD  Substance and Sexual Activity   Alcohol use: Not Currently    Alcohol/week: 1.0 standard drink of alcohol    Types: 1 Cans of beer per week    Comment: last use 09/30/21   Drug use: Not Currently    Comment: Hx of cocaine, marijuana, k2, Xanax, and Molly on and off for the past 5  years   Sexual activity: Yes    Partners: Female    Birth control/protection: None  Other Topics Concern   Not on file  Social History Narrative   Not on file   Social Drivers of Health   Financial Resource Strain: High Risk (10/06/2021)   Overall Financial Resource Strain (CARDIA)    Difficulty of Paying Living Expenses: Very hard  Food Insecurity: Food Insecurity Present (07/10/2023)   Hunger Vital Sign    Worried About Running Out of Food in the Last Year: Sometimes true    Ran Out of Food in the Last Year: Sometimes true  Transportation Needs: Unmet Transportation Needs (07/10/2023)   PRAPARE - Transportation    Lack of Transportation (Medical): Yes    Lack of Transportation (Non-Medical): Yes  Physical Activity: Sufficiently Active (10/06/2021)   Exercise Vital Sign    Days of Exercise per Week: 7 days    Minutes of Exercise per Session: 30 min  Stress: No Stress Concern Present (10/06/2021)   [employer] of Occupational Health - Occupational Stress Questionnaire    Feeling of Stress : Only a little  Social Connections: Socially Integrated (10/06/2021)   Social Connection and Isolation Panel [NHANES]    Frequency of Communication with Friends and Family: More than three times a week    Frequency of Social Gatherings with Friends and Family: More than three times a week    Attends Religious Services: More than 4 times per year    Active Member of [employer] or Organizations: No    Attends Engineer, structural: More than 4 times per year    Marital Status: Living with partner   Additional Social History:                         Sleep: Good  Appetite:  Good  Current Medications: Current Facility-Administered Medications  Medication Dose Route Frequency Provider Last Rate Last Admin   acetaminophen  (TYLENOL ) tablet 650 mg  650 mg Oral Q6H PRN Motley-Mangrum, Jadeka A, PMHNP   650 mg at 07/11/23 0030   alum & mag hydroxide-simeth (MAALOX/MYLANTA)  200-200-20 MG/5ML suspension 30 mL  30 mL Oral Q4H PRN Motley-Mangrum, Jadeka A, PMHNP       haloperidol  (HALDOL ) tablet 5 mg  5 mg Oral TID PRN Motley-Mangrum, Jadeka A, PMHNP   5 mg at 07/11/23 1610   And   diphenhydrAMINE  (BENADRYL ) capsule 50 mg  50 mg Oral TID PRN Motley-Mangrum, Jadeka A, PMHNP   50 mg at 07/11/23 9604   haloperidol  lactate (HALDOL ) injection 5 mg  5 mg Intramuscular TID PRN Motley-Mangrum, Jadeka A, PMHNP       And   diphenhydrAMINE  (BENADRYL ) injection 50 mg  50 mg Intramuscular TID PRN Motley-Mangrum, Jadeka A, PMHNP       And   LORazepam  (ATIVAN ) injection 2 mg  2 mg Intramuscular TID PRN Motley-Mangrum, Jadeka A, PMHNP       haloperidol  lactate (  HALDOL ) injection 10 mg  10 mg Intramuscular TID PRN Motley-Mangrum, Jadeka A, PMHNP       And   diphenhydrAMINE  (BENADRYL ) injection 50 mg  50 mg Intramuscular TID PRN Motley-Mangrum, Jadeka A, PMHNP       And   LORazepam  (ATIVAN ) injection 2 mg  2 mg Intramuscular TID PRN Motley-Mangrum, Jadeka A, PMHNP       [START ON 07/15/2023] divalproex  (DEPAKOTE  ER) 24 hr tablet 1,000 mg  1,000 mg Oral Daily Queen Abbett S, DO       divalproex  (DEPAKOTE  ER) 24 hr tablet 500 mg  500 mg Oral Once Rabab Currington S, DO       feeding supplement (ENSURE ENLIVE / ENSURE PLUS) liquid 237 mL  237 mL Oral BID BM Attiah, Nadir, MD   237 mL at 07/14/23 0925   hydrOXYzine  (ATARAX ) tablet 25 mg  25 mg Oral TID PRN Motley-Mangrum, Jadeka A, PMHNP   25 mg at 07/14/23 1610   magnesium  hydroxide (MILK OF MAGNESIA) suspension 30 mL  30 mL Oral Daily PRN Motley-Mangrum, Jadeka A, PMHNP       nicotine  polacrilex (NICORETTE ) gum 2 mg  2 mg Oral Q4H while awake Motley-Mangrum, Jadeka A, PMHNP   2 mg at 07/14/23 0810   risperiDONE  (RISPERDAL  M-TABS) disintegrating tablet 2 mg  2 mg Oral BID Sophiana Milanese S, DO   2 mg at 07/14/23 9604   traZODone  (DESYREL ) tablet 50 mg  50 mg Oral QHS PRN Ajibola, Ene A, NP   50 mg at 07/13/23 2152    Lab  Results:  Results for orders placed or performed during the hospital encounter of 07/10/23 (from the past 48 hours)  Valproic acid  level     Status: Abnormal   Collection Time: 07/13/23  6:16 AM  Result Value Ref Range   Valproic Acid  Lvl 25 (L) 50 - 100 ug/mL    Comment: Performed at [facility], [address] [place], [place] [address]    Blood Alcohol level:  Lab Results  Component Value Date   [facility] <15 07/10/2023   ETH <10 09/24/2021    Metabolic Disorder Labs: Lab Results  Component Value Date   HGBA1C 5.0 07/12/2023   MPG 96.8 07/12/2023   MPG 108.28 09/24/2021   Lab Results  Component Value Date   PROLACTIN 4.4 06/09/2020   Lab Results  Component Value Date   CHOL 126 07/12/2023   TRIG 48 07/12/2023   HDL 44 07/12/2023   CHOLHDL 2.9 07/12/2023   VLDL 10 07/12/2023   LDLCALC 72 07/12/2023   LDLCALC 81 09/24/2021    Physical Findings: AIMS:  , ,  ,  ,    CIWA:    COWS:     Musculoskeletal: Strength & Muscle Tone: within normal limits Gait & Station: normal Patient leans: N/A  Psychiatric Specialty Exam:  Presentation  General Appearance:  Casual; Appropriate for Environment  Eye Contact: Fair  Speech: Clear and Coherent; Normal Rate  Speech Volume: Normal  Handedness: Ambidextrous   Mood and Affect  Mood: Euphoric  Affect: Full Range   Thought Process  Thought Processes: -- (mostly coherent, some disorganization)  Descriptions of Associations:Loose  Orientation:Full (Time, Place and Person)  Thought Content:Scattered  History of Schizophrenia/Schizoaffective disorder:No data recorded Duration of Psychotic Symptoms:No data recorded Hallucinations:Hallucinations: None  Ideas of Reference:None  Suicidal Thoughts:Suicidal Thoughts: No  Homicidal Thoughts:Homicidal Thoughts: No   Sensorium  Memory: Immediate Fair  Judgment: Fair  Insight: Fair  Executive Functions   Concentration: Poor  Attention Span: Fair  Recall: [employer] of Knowledge: Fair  Language: Fair   Psychomotor Activity  Psychomotor Activity: Psychomotor Activity: Restlessness   Assets  Assets: Physical Health; Resilience   Sleep  Sleep: Sleep: Good Number of Hours of Sleep: 7    Physical Exam: Physical Exam Vitals and nursing note reviewed.  Constitutional:      General: He is not in acute distress.    Appearance: Normal appearance. He is normal weight. He is not ill-appearing or toxic-appearing.  HENT:     Head: Normocephalic and atraumatic.  Pulmonary:     Effort: Pulmonary effort is normal.  Musculoskeletal:        General: Normal range of motion.  Neurological:     General: No focal deficit present.     Mental Status: He is alert.    Review of Systems  Respiratory:  Negative for cough and shortness of breath.   Cardiovascular:  Negative for chest pain.  Gastrointestinal:  Negative for abdominal pain, constipation, diarrhea, nausea and vomiting.  Neurological:  Negative for dizziness, weakness and headaches.  Psychiatric/Behavioral:  Negative for depression, hallucinations and suicidal ideas. The patient is not nervous/anxious.    Blood pressure 132/84, pulse (!) 111, temperature 97.6 F (36.4 C), temperature source Oral, resp. rate 18, height 5\' 9"  (1.753 m), weight 63 kg, SpO2 100%. Body mass index is 20.53 kg/m.   Treatment Plan Summary: Daily contact with patient to assess and evaluate symptoms and progress in treatment and Medication management  Jay Woods is a 23 yr old male who presented on 5/17 to [facility] via law enforcement under IVC for erratic aggressive behavior towards his family, he was admitted to [facility] on 5/18.  PPHx is significant for Schizoaffective Disorder, GAD, IED, Polysubstance Abuse (Benzo, Meth, THC), and 4 Prior Psychiatric Hospitalizations (last- [name]/2022), and no history of Suicide Attempts or Self  Injurious Behavior.     Jay Woods is continuing to stabilize but does continue to be manic and occasionally tangential/scattered. Since his Depakote  lvl was low yesterday at 25 we again discussed an increase in Depakote  and he was agreeable to it.  We will increase his Depakote  today.  We will not make any other changes to his medications at this time.  We will continue to monitor.     Schizoaffective Disorder, Bipolar Type, current episode Manic  IED: -Continue Risperdal  2 mg BID for mood stability and psychosis -Increase Depakote  ER to 1000 mg daily for mood stability -Continue Agitation Protocol: Haldol /Ativan /Benadryl      Nicotine  Dependence: -Continue Nicotine  Gum 2 mg q4 PRN     -Continue Ensure 237 mL BID -Continue PRN's: Tylenol , Maalox, Atarax , Milk of Magnesia   --  The risks/benefits/side-effects/alternatives to medications were discussed in detail with the patient and time was given for questions. The patient consents to medication trials.                -- Metabolic profile and EKG monitoring obtained while on an atypical antipsychotic (BMI: Lipid Panel: HbgA1c: QTc:)              -- Encouraged patient to participate in unit milieu and in scheduled group therapies              -- Short Term Goals: Ability to identify changes in lifestyle to reduce recurrence of condition will improve, Ability to verbalize feelings will improve, Ability to disclose and discuss suicidal ideas, Ability to demonstrate self-control  will improve, Ability to identify and develop effective coping behaviors will improve, Ability to maintain clinical measurements within normal limits will improve, Compliance with prescribed medications will improve, and Ability to identify triggers associated with substance abuse/mental health issues will improve             -- Long Term Goals: Improvement in symptoms so as ready for discharge   Safety and Monitoring:             -- Involuntary admission to inpatient  psychiatric unit for safety, stabilization and treatment             -- Daily contact with patient to assess and evaluate symptoms and progress in treatment             -- Patient's case to be discussed in multi-disciplinary team meeting             -- Observation Level : q15 minute checks             -- Vital signs:  q12 hours             -- Precautions: suicide, elopement, and assault  Discharge Planning:              -- Social work and case management to assist with discharge planning and identification of hospital follow-up needs prior to discharge             -- Estimated LOS: 4-5 more days             -- Discharge Concerns: Need to establish a safety plan; Medication compliance and effectiveness             -- Discharge Goals: Return home with outpatient referrals for mental health follow-up including medication management/psychotherapy   Basilia Bosworth, DO 07/14/2023, 10:03 AM

## 2023-07-14 NOTE — Plan of Care (Incomplete Revision)
 Pt visible in hallway on initial contact. Observed to be animated with brief but fair eye contact, hyperactive, fidgety, restless with pressured, tangential speech, preoccupied about being friends to multiple gang members and family history roots from [place]. Rates his anxiety 5/10, depression 2/10 and anger 0/10. States his stressor are "I miss my family, my girlfriend. I messed up, I didn't tell my girlfriend about my gang friends. I have friends that are both [employer]ribs and [employer] that are like family. I'm Latina, my mom is from this area called [place] in [place]   Support, encouragement and reassurance offered. Safety checks maintained at Q 15 minutes intervals without further events.     Problem: Coping: Goal: Ability to verbalize frustrations and anger appropriately will improve Outcome: Progressing Goal: Ability to demonstrate self-control will improve Outcome: Progressing   Problem: Safety: Goal: Periods of time without injury will increase Outcome: Progressing

## 2023-07-14 NOTE — Plan of Care (Signed)
 Pt presents with anxious affect and pleasant mood. Denies SI, HI, AVH, and pain. Cooperative and calm in interactions with staff. Pt was observed in the milieu socializing with peers, but did not attend evening group. Medication compliant with no adverse reactions. Safety checks maintained at q 15 minutes. Support, encouragement, and reassurance offered to the pt.   Problem: Education: Goal: Emotional status will improve Outcome: Progressing Goal: Mental status will improve Outcome: Progressing Goal: Verbalization of understanding the information provided will improve Outcome: Progressing   Problem: Activity: Goal: Interest or engagement in activities will improve Outcome: Progressing   Problem: Safety: Goal: Periods of time without injury will increase Outcome: Progressing

## 2023-07-14 NOTE — Progress Notes (Signed)
 Adult Psychoeducational Group Note  Date:  07/14/2023 Time:  8:44 PM  Group Topic/Focus:  Wrap-Up Group:   The focus of this group is to help patients review their daily goal of treatment and discuss progress on daily workbooks.  Participation Level:  Active  Participation Quality:  Appropriate  Affect:  Appropriate  Cognitive:  Appropriate  Insight: Appropriate  Engagement in Group:  Engaged  Modes of Intervention:  Discussion  Additional Comments:  [name] attend NA wrap up group  Jay Woods 07/14/2023, 8:44 PM

## 2023-07-14 NOTE — Group Note (Signed)
 Recreation Therapy Group Note   Group Topic:Communication  Group Date: 07/14/2023 Start Time: 1610 End Time: 1000 Facilitators: Kelsey Durflinger-McCall, LRT,CTRS Location: [address] Dayroom   Group Topic: Communication, Team Building, Problem Solving  Goal Area(s) Addresses:  Patient will effectively work with peer towards shared goal.  Patient will identify skills used to make activity successful.  Patient will identify how skills used during activity can be applied to reach post d/c goals.   Behavioral Response: Appropriate  Intervention: STEM Activity- Glass blower/designer  Activity: Tallest [employer]. In teams of 5-6, patients were given 11 craft pipe cleaners. Using the materials provided, patients were instructed to compete again the opposing team(s) to build the tallest free-standing structure from floor level. The activity was timed; difficulty increased by Clinical research associate as Production designer, theatre/television/film continued.  Systematically resources were removed with additional directions for example, placing one arm behind their back, working in silence, and shape stipulations. LRT facilitated post-activity discussion reviewing team processes and necessary communication skills involved in completion. Patients were encouraged to reflect how the skills utilized, or not utilized, in this activity can be incorporated to positively impact support systems post discharge.  Education: Pharmacist, community, Scientist, physiological, Discharge Planning   Education Outcome: Acknowledges education/In group clarification offered/Needs additional education.    Affect/Mood: Appropriate   Participation Level: Engaged   Participation Quality: Independent   Behavior: Appropriate   Speech/Thought Process: Focused   Insight: Good   Judgement: Good   Modes of Intervention: STEM Activity   Patient Response to Interventions:  Engaged   Education Outcome:  In group clarification offered    Clinical  Observations/Individualized Feedback: Pt was bright and engaged during activity. Pt was appropriate and focused throughout.     Plan: Continue to engage patient in RT group sessions 2-3x/week.   Angeles Paolucci-McCall, LRT,CTRS 07/14/2023 12:53 PM

## 2023-07-14 NOTE — Plan of Care (Signed)
   Problem: Coping: Goal: Ability to verbalize frustrations and anger appropriately will improve Outcome: Progressing Goal: Ability to demonstrate self-control will improve Outcome: Progressing   Problem: Safety: Goal: Periods of time without injury will increase Outcome: Progressing

## 2023-07-15 NOTE — [facility] Suicide Risk Assessment (Signed)
 [facility] INPATIENT:  Family/Significant Other Suicide Prevention Education  Suicide Prevention Education:  Contact Attempts: Jay Woods  (mother) [phone], (name of family member/significant other) has been identified by the patient as the family member/significant other with whom the patient will be residing, and identified as the person(s) who will aid the patient in the event of a mental health crisis.  With written consent from the patient, two attempts were made to provide suicide prevention education, prior to and/or following the patient's discharge.  We were unsuccessful in providing suicide prevention education.  A suicide education pamphlet was given to the patient to share with family/significant other.  Date and time of first attempt: 5/20 1050, LVM Date and time of second attempt: 5/21 1610, LVM Date and time of third attempt: 5/22 1605, LVM  Jay Woods 07/15/2023, 4:11 PM

## 2023-07-15 NOTE — Group Note (Signed)
 LCSW Group Therapy Note   Group Date: 07/15/2023 Start Time: 1100 End Time: 1200   Participation:  patient was present and actively participated in the discussion   Type of Therapy:  Group Therapy  Topic:  Shining from Within:  Confidence and Self-love Journey  Objective:  To support participants in developing confidence and self-love through self-awareness, self-compassion, and practical skills that nurture personal growth.   Group Goals Encourage self-reflection and self-acceptance by identifying personal strengths and achievements. Teach skills to challenge negative self-talk and replace it with supportive, truthful self-talk. Foster resilience and self-worth through [name], gratitude, and self-care practices.   Summary:  This group explores the connection between confidence and self-love by guiding participants through reflection, mindset shifts, and practical tools like affirmations, strength recognition, and goal-setting. Activities are designed to promote self-compassion, build emotional resilience, and normalize the slow, patient journey of inner growth.   Therapeutic Modalities Used: Elements of Cognitive Behavioral Therapy (CBT): Challenging and reframing unhelpful self-talk. Elements of Motivational Interviewing (MI): Encouraging small, achievable goals. Elements of Dialectical Behavioral Therapist (DBT):  Mindfulness and Self-Compassion: Promoting present-moment awareness and kindness toward self.   Tailor [name] [name], LCSWA 07/15/2023  12:10 PM

## 2023-07-15 NOTE — Progress Notes (Signed)
   07/15/23 0900  Psych Admission Type (Psych Patients Only)  Admission Status Involuntary  Psychosocial Assessment  Patient Complaints Anxiety  Eye Contact Fair  Facial Expression Animated;Anxious  Affect Anxious  Speech Logical/coherent  Interaction Assertive  Motor Activity Fidgety  Appearance/Hygiene Unremarkable  Behavior Characteristics Cooperative;Anxious  Mood Anxious;Pleasant;Euthymic  Thought Process  Coherency Circumstantial;Tangential  Content Blaming self  Delusions None reported or observed  Perception WDL  Hallucination None reported or observed  Judgment Limited  Confusion None  Danger to Self  Current suicidal ideation? Denies  Self-Injurious Behavior No self-injurious ideation or behavior indicators observed or expressed   Agreement Not to Harm Self Yes  Description of Agreement verbal  Danger to Others  Danger to Others None reported or observed

## 2023-07-15 NOTE — Plan of Care (Signed)
  Problem: Education: Goal: Emotional status will improve Outcome: Progressing Goal: Verbalization of understanding the information provided will improve Outcome: Progressing   Problem: Activity: Goal: Sleeping patterns will improve Outcome: Progressing   Problem: Coping: Goal: Ability to verbalize frustrations and anger appropriately will improve Outcome: Progressing Goal: Ability to demonstrate self-control will improve Outcome: Progressing

## 2023-07-15 NOTE — Progress Notes (Signed)
   07/15/23 0000  Psych Admission Type (Psych Patients Only)  Admission Status Involuntary  Psychosocial Assessment  Patient Complaints Anxiety  Eye Contact Fair  Facial Expression Anxious;Animated  Affect Appropriate to circumstance  Speech Logical/coherent;Pressured  Interaction Assertive  Motor Activity Fidgety;Restless  Appearance/Hygiene Unremarkable  Behavior Characteristics Cooperative  Mood Anxious;Pleasant  Aggressive Behavior  Effect No apparent injury  Thought Process  Coherency Circumstantial;Tangential  Content Blaming self  Delusions None reported or observed  Perception WDL  Hallucination None reported or observed  Judgment Limited  Confusion None  Danger to Self  Current suicidal ideation? Denies  Self-Injurious Behavior No self-injurious ideation or behavior indicators observed or expressed   Agreement Not to Harm Self Yes  Description of Agreement verbal  Danger to Others  Danger to Others None reported or observed

## 2023-07-15 NOTE — Progress Notes (Signed)
 [facility] MD Progress Note  07/15/2023 11:03 AM Jay Woods  MRN:  [id number] Subjective:   Jay Woods is a 23 yr old male who presented on 5/17 to [facility] via law enforcement under IVC for erratic aggressive behavior towards his family, he was admitted to [facility] on 5/18. PPHx is significant for Schizoaffective Disorder, GAD, IED, Polysubstance Abuse (Benzo, Meth, THC), and 4 Prior Psychiatric Hospitalizations (last- [name] 07/2022), and no history of Suicide Attempts or Self Injurious Behavior.    Case was discussed in the multidisciplinary team. MAR was reviewed and patient was compliant with medications.  He received PRN Hydroxyzine  and Trazodone  last night.   Psychiatric Team made the following recommendations yesterday: -Continue Risperdal  2 mg BID this evening for mood stability and psychosis -Increase Depakote  ER to 1000 mg daily for mood stability    On interview today patient reports he slept good last night.  He reports his appetite is doing good.  He reports no SI, HI, or AVH.  He reports no Paranoia or Ideas of Reference.  He reports no issues with his medications.  He reports being thankful for the care that staff has been reporting.  He reports that he is feeling better.  Discussed with him the importance of continuing to take his medications.  He reports no other concerns at present.    Principal Problem: Schizoaffective disorder, bipolar type (HCC) Diagnosis: Principal Problem:   Schizoaffective disorder, bipolar type (HCC)  Total Time spent with patient:  I personally spent 35 minutes on the unit in direct patient care. The direct patient care time included face-to-face time with the patient, reviewing the patient's chart, communicating with other professionals, and coordinating care. Greater than 50% of this time was spent in counseling or coordinating care with the patient regarding goals of hospitalization, psycho-education, and discharge planning needs.    Past Psychiatric History:  Schizoaffective Disorder, GAD, IED, Polysubstance Abuse (Benzo, Meth, THC), and 4 Prior Psychiatric Hospitalizations (last- [name]/2022), and no history of Suicide Attempts or Self Injurious Behavior.   Past Medical History:  Past Medical History:  Diagnosis Date   Eczema    Schizoaffective disorder (HCC)    History reviewed. No pertinent surgical history. Family History:  Family History  Problem Relation Age of Onset   Healthy Mother    Healthy Father    Family Psychiatric  History:  He reports possible family diagnosis' Reports No Known Suicides or Substance Abuse   Social History:  Social History   Substance and Sexual Activity  Alcohol Use Not Currently   Alcohol/week: 1.0 standard drink of alcohol   Types: 1 Cans of beer per week   Comment: last use 09/30/21     Social History   Substance and Sexual Activity  Drug Use Not Currently   Comment: Hx of cocaine, marijuana, k2, Xanax, and Molly on and off for the past 5 years    Social History   Socioeconomic History   Marital status: Single    Spouse name: Not on file   Number of children: Not on file   Years of education: Not on file   Highest education level: Not on file  Occupational History   Not on file  Tobacco Use   Smoking status: Former    Current packs/day: 0.25    Average packs/day: 0.3 packs/day for 5.0 years (1.3 ttl pk-yrs)    Types: Cigarettes   Smokeless tobacco: Former  Advertising account planner   Vaping status: Every Day   Substances: CBD  Substance and Sexual Activity   Alcohol use: Not Currently    Alcohol/week: 1.0 standard drink of alcohol    Types: 1 Cans of beer per week    Comment: last use 09/30/21   Drug use: Not Currently    Comment: Hx of cocaine, marijuana, k2, Xanax, and Molly on and off for the past 5 years   Sexual activity: Yes    Partners: Female    Birth control/protection: None  Other Topics Concern   Not on file  Social History Narrative   Not  on file   Social Drivers of Health   Financial Resource Strain: High Risk (10/06/2021)   Overall Financial Resource Strain (CARDIA)    Difficulty of Paying Living Expenses: Very hard  Food Insecurity: Food Insecurity Present (07/10/2023)   Hunger Vital Sign    Worried About Running Out of Food in the Last Year: Sometimes true    Ran Out of Food in the Last Year: Sometimes true  Transportation Needs: Unmet Transportation Needs (07/10/2023)   PRAPARE - Transportation    Lack of Transportation (Medical): Yes    Lack of Transportation (Non-Medical): Yes  Physical Activity: Sufficiently Active (10/06/2021)   Exercise Vital Sign    Days of Exercise per Week: 7 days    Minutes of Exercise per Session: 30 min  Stress: No Stress Concern Present (10/06/2021)   [employer] of Occupational Health - Occupational Stress Questionnaire    Feeling of Stress : Only a little  Social Connections: Socially Integrated (10/06/2021)   Social Connection and Isolation Panel [NHANES]    Frequency of Communication with Friends and Family: More than three times a week    Frequency of Social Gatherings with Friends and Family: More than three times a week    Attends Religious Services: More than 4 times per year    Active Member of [employer] or Organizations: No    Attends Engineer, structural: More than 4 times per year    Marital Status: Living with partner   Additional Social History:                         Sleep: Good  Appetite:  Good  Current Medications: Current Facility-Administered Medications  Medication Dose Route Frequency Provider Last Rate Last Admin   acetaminophen  (TYLENOL ) tablet 650 mg  650 mg Oral Q6H PRN Motley-Mangrum, Jadeka A, PMHNP   650 mg at 07/11/23 0030   alum & mag hydroxide-simeth (MAALOX/MYLANTA) 200-200-20 MG/5ML suspension 30 mL  30 mL Oral Q4H PRN Motley-Mangrum, Jadeka A, PMHNP       haloperidol  (HALDOL ) tablet 5 mg  5 mg Oral TID PRN Motley-Mangrum,  Jadeka A, PMHNP   5 mg at 07/11/23 3329   And   diphenhydrAMINE  (BENADRYL ) capsule 50 mg  50 mg Oral TID PRN Motley-Mangrum, Jadeka A, PMHNP   50 mg at 07/11/23 5188   haloperidol  lactate (HALDOL ) injection 5 mg  5 mg Intramuscular TID PRN Motley-Mangrum, Jadeka A, PMHNP       And   diphenhydrAMINE  (BENADRYL ) injection 50 mg  50 mg Intramuscular TID PRN Motley-Mangrum, Jadeka A, PMHNP       And   LORazepam  (ATIVAN ) injection 2 mg  2 mg Intramuscular TID PRN Motley-Mangrum, Jadeka A, PMHNP       haloperidol  lactate (HALDOL ) injection 10 mg  10 mg Intramuscular TID PRN Motley-Mangrum, Jadeka A, PMHNP       And   diphenhydrAMINE  (BENADRYL ) injection 50  mg  50 mg Intramuscular TID PRN Motley-Mangrum, Jadeka A, PMHNP       And   LORazepam  (ATIVAN ) injection 2 mg  2 mg Intramuscular TID PRN Motley-Mangrum, Jadeka A, PMHNP       divalproex  (DEPAKOTE  ER) 24 hr tablet 1,000 mg  1,000 mg Oral Daily Linn Goetze S, DO   1,000 mg at 07/15/23 0831   feeding supplement (ENSURE ENLIVE / ENSURE PLUS) liquid 237 mL  237 mL Oral BID BM Attiah, Nadir, MD   237 mL at 07/15/23 1055   hydrOXYzine  (ATARAX ) tablet 25 mg  25 mg Oral TID PRN Motley-Mangrum, Jadeka A, PMHNP   25 mg at 07/14/23 2127   magnesium  hydroxide (MILK OF MAGNESIA) suspension 30 mL  30 mL Oral Daily PRN Motley-Mangrum, Jadeka A, PMHNP       nicotine  polacrilex (NICORETTE ) gum 2 mg  2 mg Oral Q4H while awake Motley-Mangrum, Jadeka A, PMHNP   2 mg at 07/15/23 1055   risperiDONE  (RISPERDAL  M-TABS) disintegrating tablet 2 mg  2 mg Oral BID Robert Sperl S, DO   2 mg at 07/15/23 0831   traZODone  (DESYREL ) tablet 50 mg  50 mg Oral QHS PRN Ajibola, Ene A, NP   50 mg at 07/14/23 2127    Lab Results:  No results found for this or any previous visit (from the past 48 hours).   Blood Alcohol level:  Lab Results  Component Value Date   [facility] <15 07/10/2023   ETH <10 09/24/2021    Metabolic Disorder Labs: Lab Results  Component Value  Date   HGBA1C 5.0 07/12/2023   MPG 96.8 07/12/2023   MPG 108.28 09/24/2021   Lab Results  Component Value Date   PROLACTIN 4.4 06/09/2020   Lab Results  Component Value Date   CHOL 126 07/12/2023   TRIG 48 07/12/2023   HDL 44 07/12/2023   CHOLHDL 2.9 07/12/2023   VLDL 10 07/12/2023   LDLCALC 72 07/12/2023   LDLCALC 81 09/24/2021    Physical Findings: AIMS:  , ,  ,  ,    CIWA:    COWS:     Musculoskeletal: Strength & Muscle Tone: within normal limits Gait & Station: normal Patient leans: N/A  Psychiatric Specialty Exam:  Presentation  General Appearance:  Appropriate for Environment; Casual  Eye Contact: Fair  Speech: Clear and Coherent; Normal Rate  Speech Volume: Normal  Handedness: Ambidextrous   Mood and Affect  Mood: Euthymic  Affect: Full Range   Thought Process  Thought Processes: Goal Directed  Descriptions of Associations:Intact  Orientation:Full (Time, Place and Person)  Thought Content:Scattered  History of Schizophrenia/Schizoaffective disorder:No data recorded Duration of Psychotic Symptoms:No data recorded Hallucinations:Hallucinations: None  Ideas of Reference:None  Suicidal Thoughts:Suicidal Thoughts: No  Homicidal Thoughts:Homicidal Thoughts: No   Sensorium  Memory: Immediate Fair  Judgment: Fair  Insight: Fair   Art therapist  Concentration: Fair  Attention Span: Fair  Recall: [employer] of Knowledge: Fair  Language: Fair   Psychomotor Activity  Psychomotor Activity: Psychomotor Activity: Restlessness   Assets  Assets: Physical Health; Resilience   Sleep  Sleep: Sleep: Good Number of Hours of Sleep: 5.5    Physical Exam: Physical Exam Vitals and nursing note reviewed.  Constitutional:      General: He is not in acute distress.    Appearance: Normal appearance. He is normal weight. He is not ill-appearing or toxic-appearing.  HENT:     Head: Normocephalic and  atraumatic.  Pulmonary:  Effort: Pulmonary effort is normal.  Musculoskeletal:        General: Normal range of motion.  Neurological:     General: No focal deficit present.     Mental Status: He is alert.    Review of Systems  Respiratory:  Negative for cough and shortness of breath.   Cardiovascular:  Negative for chest pain.  Gastrointestinal:  Negative for abdominal pain, constipation, diarrhea, nausea and vomiting.  Neurological:  Negative for dizziness, weakness and headaches.  Psychiatric/Behavioral:  Negative for depression, hallucinations and suicidal ideas. The patient is not nervous/anxious.    Blood pressure (!) 140/91, pulse 88, temperature 97.6 F (36.4 C), temperature source Oral, resp. rate 18, height 5\' 9"  (1.753 m), weight 63 kg, SpO2 99%. Body mass index is 20.53 kg/m.   Treatment Plan Summary: Daily contact with patient to assess and evaluate symptoms and progress in treatment and Medication management  Jay Woods is a 23 yr old male who presented on 5/17 to [facility] via law enforcement under IVC for erratic aggressive behavior towards his family, he was admitted to [facility] on 5/18.  PPHx is significant for Schizoaffective Disorder, GAD, IED, Polysubstance Abuse (Benzo, Meth, THC), and 4 Prior Psychiatric Hospitalizations (last- [name]/2022), and no history of Suicide Attempts or Self Injurious Behavior.     Jay Woods tolerated the increase of the Depakote  made yesterday.  He still has excess energy but is returning towards baseline.  We will not make any changes to his medications at this time.  We will continue to monitor.    Schizoaffective Disorder, Bipolar Type, current episode Manic  IED: -Continue Risperdal  2 mg BID for mood stability and psychosis -Continue Depakote  ER 1000 mg daily for mood stability -Continue Agitation Protocol: Haldol /Ativan /Benadryl      Nicotine  Dependence: -Continue Nicotine  Gum 2 mg q4 PRN     -Continue Ensure 237 mL  BID -Continue PRN's: Tylenol , Maalox, Atarax , Milk of Magnesia   --  The risks/benefits/side-effects/alternatives to medications were discussed in detail with the patient and time was given for questions. The patient consents to medication trials.                -- Metabolic profile and EKG monitoring obtained while on an atypical antipsychotic (BMI: Lipid Panel: HbgA1c: QTc:)              -- Encouraged patient to participate in unit milieu and in scheduled group therapies              -- Short Term Goals: Ability to identify changes in lifestyle to reduce recurrence of condition will improve, Ability to verbalize feelings will improve, Ability to disclose and discuss suicidal ideas, Ability to demonstrate self-control will improve, Ability to identify and develop effective coping behaviors will improve, Ability to maintain clinical measurements within normal limits will improve, Compliance with prescribed medications will improve, and Ability to identify triggers associated with substance abuse/mental health issues will improve             -- Long Term Goals: Improvement in symptoms so as ready for discharge   Safety and Monitoring:             -- Involuntary admission to inpatient psychiatric unit for safety, stabilization and treatment             -- Daily contact with patient to assess and evaluate symptoms and progress in treatment             -- Patient's case to  be discussed in multi-disciplinary team meeting             -- Observation Level : q15 minute checks             -- Vital signs:  q12 hours             -- Precautions: suicide, elopement, and assault  Discharge Planning:              -- Social work and case management to assist with discharge planning and identification of hospital follow-up needs prior to discharge             -- Estimated LOS: 2-4 more days             -- Discharge Concerns: Need to establish a safety plan; Medication compliance and effectiveness              -- Discharge Goals: Return home with outpatient referrals for mental health follow-up including medication management/psychotherapy   Basilia Bosworth, DO 07/15/2023, 11:03 AM

## 2023-07-15 NOTE — Plan of Care (Signed)
   Problem: Education: Goal: Knowledge of [facility] Education information/materials will improve Outcome: Progressing Goal: Emotional status will improve Outcome: Progressing Goal: Mental status will improve Outcome: Progressing Goal: Verbalization of understanding the information provided will improve Outcome: Progressing

## 2023-07-15 NOTE — Group Note (Signed)
 Date:  07/15/2023 Time:  8:45 PM  Group Topic/Focus:  Wrap-Up Group:   The focus of this group is to help patients review their daily goal of treatment and discuss progress on daily workbooks.    Participation Level:  Did Not Attend  Participation Quality:  N/A  Affect:  N/A  Cognitive:  N/A  Insight: None  Engagement in Group:  N/A  Modes of Intervention:  N/A  Additional Comments:  Patient did not attend wrap up group.   Dillard Frame 07/15/2023, 8:45 PM

## 2023-07-16 ENCOUNTER — Encounter (HOSPITAL_COMMUNITY): Payer: Self-pay

## 2023-07-16 DIAGNOSIS — F250 Schizoaffective disorder, bipolar type: Secondary | ICD-10-CM | POA: Diagnosis not present

## 2023-07-16 MED ORDER — RISPERIDONE 2 MG PO TBDP
2.0000 mg | ORAL_TABLET | Freq: Every day | ORAL | Status: DC
  Administered 2023-07-17 – 2023-07-19 (×3): 2 mg via ORAL
  Filled 2023-07-16 (×3): qty 1

## 2023-07-16 MED ORDER — RISPERIDONE 3 MG PO TBDP
3.0000 mg | ORAL_TABLET | Freq: Every day | ORAL | Status: DC
Start: 2023-07-17 — End: 2023-07-25
  Administered 2023-07-17 – 2023-07-24 (×8): 3 mg via ORAL
  Filled 2023-07-16 (×8): qty 1

## 2023-07-16 NOTE — BH IP Treatment Plan (Signed)
 Interdisciplinary Treatment and Diagnostic Plan Update  07/16/2023 Time of Session: 12:15 PM - UPDATE Jay Woods MRN: [id number]  Principal Diagnosis: Schizoaffective disorder, bipolar type (HCC)  Secondary Diagnoses: Principal Problem:   Schizoaffective disorder, bipolar type (HCC)   Current Medications:  Current Facility-Administered Medications  Medication Dose Route Frequency Provider Last Rate Last Admin   acetaminophen  (TYLENOL ) tablet 650 mg  650 mg Oral Q6H PRN Motley-Mangrum, Jadeka A, PMHNP   650 mg at 07/11/23 0030   alum & mag hydroxide-simeth (MAALOX/MYLANTA) 200-200-20 MG/5ML suspension 30 mL  30 mL Oral Q4H PRN Motley-Mangrum, Jadeka A, PMHNP       haloperidol  (HALDOL ) tablet 5 mg  5 mg Oral TID PRN Motley-Mangrum, Jadeka A, PMHNP   5 mg at 07/11/23 6213   And   diphenhydrAMINE  (BENADRYL ) capsule 50 mg  50 mg Oral TID PRN Motley-Mangrum, Jadeka A, PMHNP   50 mg at 07/11/23 0865   haloperidol  lactate (HALDOL ) injection 5 mg  5 mg Intramuscular TID PRN Motley-Mangrum, Jadeka A, PMHNP       And   diphenhydrAMINE  (BENADRYL ) injection 50 mg  50 mg Intramuscular TID PRN Motley-Mangrum, Jadeka A, PMHNP       And   LORazepam  (ATIVAN ) injection 2 mg  2 mg Intramuscular TID PRN Motley-Mangrum, Jadeka A, PMHNP       haloperidol  lactate (HALDOL ) injection 10 mg  10 mg Intramuscular TID PRN Motley-Mangrum, Jadeka A, PMHNP       And   diphenhydrAMINE  (BENADRYL ) injection 50 mg  50 mg Intramuscular TID PRN Motley-Mangrum, Jadeka A, PMHNP       And   LORazepam  (ATIVAN ) injection 2 mg  2 mg Intramuscular TID PRN Motley-Mangrum, Jadeka A, PMHNP       divalproex  (DEPAKOTE  ER) 24 hr tablet 1,000 mg  1,000 mg Oral Daily Pashayan, Jay S, DO   1,000 mg at 07/16/23 7846   feeding supplement (ENSURE ENLIVE / ENSURE PLUS) liquid 237 mL  237 mL Oral BID BM Attiah, Nadir, MD   237 mL at 07/16/23 1426   hydrOXYzine  (ATARAX ) tablet 25 mg  25 mg Oral TID PRN Motley-Mangrum, Jadeka A,  PMHNP   25 mg at 07/15/23 2102   magnesium  hydroxide (MILK OF MAGNESIA) suspension 30 mL  30 mL Oral Daily PRN Motley-Mangrum, Jadeka A, PMHNP       nicotine  polacrilex (NICORETTE ) gum 2 mg  2 mg Oral Q4H while awake Motley-Mangrum, Jadeka A, PMHNP   2 mg at 07/16/23 9629   risperiDONE  (RISPERDAL  M-TABS) disintegrating tablet 2 mg  2 mg Oral BID Pashayan, Jay S, DO   2 mg at 07/16/23 1802   traZODone  (DESYREL ) tablet 50 mg  50 mg Oral QHS PRN Ajibola, Ene A, NP   50 mg at 07/15/23 2102   PTA Medications: No medications prior to admission.    Patient Stressors:    Patient Strengths:    Treatment Modalities: Medication Management, Group therapy, Case management,  1 to 1 session with clinician, Psychoeducation, Recreational therapy.   Physician Treatment Plan for Primary Diagnosis: Schizoaffective disorder, bipolar type (HCC) Long Term Goal(s): Improvement in symptoms so as ready for discharge   Short Term Goals: Ability to identify changes in lifestyle to reduce recurrence of condition will improve Ability to maintain clinical measurements within normal limits will improve Compliance with prescribed medications will improve Ability to identify triggers associated with substance abuse/mental health issues will improve  Medication Management: Evaluate patient's response, side effects, and tolerance of medication regimen.  Therapeutic Interventions: 1 to 1 sessions, Unit Group sessions and Medication administration.  Evaluation of Outcomes: Progressing  Physician Treatment Plan for Secondary Diagnosis: Principal Problem:   Schizoaffective disorder, bipolar type (HCC)  Long Term Goal(s): Improvement in symptoms so as ready for discharge   Short Term Goals: Ability to identify changes in lifestyle to reduce recurrence of condition will improve Ability to maintain clinical measurements within normal limits will improve Compliance with prescribed medications will improve Ability  to identify triggers associated with substance abuse/mental health issues will improve     Medication Management: Evaluate patient's response, side effects, and tolerance of medication regimen.  Therapeutic Interventions: 1 to 1 sessions, Unit Group sessions and Medication administration.  Evaluation of Outcomes: Progressing   RN Treatment Plan for Primary Diagnosis: Schizoaffective disorder, bipolar type (HCC) Long Term Goal(s): Knowledge of disease and therapeutic regimen to maintain health will improve  Short Term Goals: Ability to remain free from injury will improve, Ability to verbalize frustration and anger appropriately will improve, Ability to verbalize feelings will improve, and Ability to disclose and discuss suicidal ideas  Medication Management: RN will administer medications as ordered by provider, will assess and evaluate patient's response and provide education to patient for prescribed medication. RN will report any adverse and/or side effects to prescribing provider.  Therapeutic Interventions: 1 on 1 counseling sessions, Psychoeducation, Medication administration, Evaluate responses to treatment, Monitor vital signs and CBGs as ordered, Perform/monitor CIWA, COWS, AIMS and Fall Risk screenings as ordered, Perform wound care treatments as ordered.  Evaluation of Outcomes: Progressing   LCSW Treatment Plan for Primary Diagnosis: Schizoaffective disorder, bipolar type (HCC) Long Term Goal(s): Safe transition to appropriate next level of care at discharge, Engage patient in therapeutic group addressing interpersonal concerns.  Short Term Goals: Engage patient in aftercare planning with referrals and resources, Increase ability to appropriately verbalize feelings, Facilitate acceptance of mental health diagnosis and concerns, and Identify triggers associated with mental health/substance abuse issues  Therapeutic Interventions: Assess for all discharge needs, 1 to 1 time with  Social worker, Explore available resources and support systems, Assess for adequacy in community support network, Educate family and significant other(s) on suicide prevention, Complete Psychosocial Assessment, Interpersonal group therapy.  Evaluation of Outcomes: Progressing   Progress in Treatment: Attending groups: Yes. Participating in groups: Yes. Taking medication as prescribed: Yes. Toleration medication: Yes. Family/Significant other contact made: Attempted to contact Julian Obey (mother) [phone] 4 times Patient understands diagnosis: Yes. Discussing patient identified problems/goals with staff: Yes. Medical problems stabilized or resolved: Yes. Denies suicidal/homicidal ideation: Yes. Issues/concerns per patient self-inventory: No.   New problem(s) identified: No  New Short Term/Long Term Goal(s): medication stabilization, elimination of SI thoughts, development of comprehensive mental wellness plan.    Patient Goals:  "Calm down, be calm."   Discharge Plan or Barriers: Patient recently admitted. CSW will continue to follow and assess for appropriate referrals and possible discharge planning.      Reason for Continuation of Hospitalization: Anxiety Mania Medication stabilization Other; describe mood stabilization, discharge planning   Estimated Length of Stay:  3 - 4 days  Last 3 [place] Suicide Severity Risk Score: Flowsheet Row [facility] Most recent reading at 07/10/2023  3:00 PM ED from 07/10/2023 in [facility] Most recent reading at 07/10/2023  2:46 AM UC from 03/22/2023 in [facility] [place] Most recent reading at 03/22/2023  6:12 PM  C-SSRS RISK CATEGORY No Risk No Risk No Risk       Last PHQ 2/9 Scores:    10/06/2021   10:12 AM 10/06/2021    8:34 AM  Depression screen PHQ 2/9  Decreased Interest 0 1  Down,  Depressed, Hopeless 0 3  PHQ - 2 Score 0 4  Altered sleeping 0 0  Tired, decreased energy 0 1  Change in appetite 0 1  Trouble concentrating 0 3  Moving slowly or fidgety/restless 0 3  Suicidal thoughts 0 0  PHQ-9 Score 0 12  Difficult doing work/chores  Not difficult at all    Scribe for Treatment Team: Palmer Fahrner O Bailen Geffre, LCSWA 07/16/2023 7:16 PM

## 2023-07-16 NOTE — Plan of Care (Signed)
  Problem: Education: Goal: Emotional status will improve Outcome: Progressing Goal: Verbalization of understanding the information provided will improve Outcome: Progressing   Problem: Coping: Goal: Ability to verbalize frustrations and anger appropriately will improve Outcome: Progressing Goal: Ability to demonstrate self-control will improve Outcome: Progressing   

## 2023-07-16 NOTE — Progress Notes (Signed)
   07/16/23 0800  Psych Admission Type (Psych Patients Only)  Admission Status Involuntary  Psychosocial Assessment  Patient Complaints Anxiety  Eye Contact Fair  Facial Expression Animated;Anxious  Affect Appropriate to circumstance  Speech Logical/coherent  Interaction Assertive  Motor Activity Fidgety  Appearance/Hygiene Unremarkable  Behavior Characteristics Cooperative;Anxious  Mood Anxious;Pleasant  Thought Process  Coherency Circumstantial;Tangential  Content Blaming self  Delusions None reported or observed  Perception WDL  Hallucination None reported or observed  Judgment Impaired  Confusion None  Danger to Self  Current suicidal ideation? Denies  Self-Injurious Behavior No self-injurious ideation or behavior indicators observed or expressed   Agreement Not to Harm Self Yes  Description of Agreement verbal  Danger to Others  Danger to Others None reported or observed

## 2023-07-16 NOTE — Plan of Care (Signed)
   Problem: Education: Goal: Knowledge of [facility] Education information/materials will improve Outcome: Progressing Goal: Emotional status will improve Outcome: Progressing Goal: Mental status will improve Outcome: Progressing Goal: Verbalization of understanding the information provided will improve Outcome: Progressing

## 2023-07-16 NOTE — Group Note (Signed)
 Date:  07/16/2023 Time:  8:49 AM  Group Topic/Focus:  Goals Group:   The focus of this group is to help patients establish daily goals to achieve during treatment and discuss how the patient can incorporate goal setting into their daily lives to aide in recovery.    Participation Level:  Active  Participation Quality:  Appropriate  Affect:  Appropriate  Jay Woods 07/16/2023, 8:49 AM

## 2023-07-16 NOTE — Group Note (Signed)
 Recreation Therapy Group Note   Group Topic:Leisure Education  Group Date: 07/16/2023 Start Time: 0935 End Time: 1005 Facilitators: Jesusa Stenerson-McCall, LRT,CTRS Location: [address] Dayroom   Group Topic: Leisure Education   Goal Area(s) Addresses:  Patient will successfully identify positive leisure and recreation activities.  Patient will acknowledge benefits of participation in healthy leisure activities post discharge.  Patient will actively work with peers toward a shared goal.   Behavioral Response: Moderate   Intervention: Competitive Group Game    Activity: Pictionary. In groups of 5-7, patients took turns trying to guess the picture being drawn on the board by their teammate.  If the team guessed the correct answer, they won a point.  If the team guessed wrong, the other team got a chance to steal the point. After several rounds of game play, the team with the most points were declared winners. Post-activity discussion reviewed benefits of positive recreation outlets: reducing stress, improving coping mechanisms, increasing self-esteem, and building larger support systems.   Education:  Teacher, English as a foreign language, Leisure as Merchant navy officer, Programmer, applications, Building control surveyor   Education Outcome: Acknowledges education/In group clarification offered/Needs additional education   Affect/Mood: Appropriate   Participation Level: Moderate   Participation Quality: Independent   Behavior: Cooperative   Speech/Thought Process: Relevant   Insight: Moderate   Judgement: Moderate   Modes of Intervention: Competitive Play   Patient Response to Interventions:  Receptive   Education Outcome:  In group clarification offered    Clinical Observations/Individualized Feedback: Pt was attentive but struggled a bit following the instructions. Pt was finally able to complete a drawing and attempt to guess another. Pt left early and didn't return.     Plan: Continue to engage  patient in RT group sessions 2-3x/week.   Parth Mccormac-McCall, LRT,CTRS 07/16/2023 12:08 PM

## 2023-07-16 NOTE — Progress Notes (Signed)
 [facility] MD Progress Note  07/16/2023 8:45 PM Jay Woods  MRN:  [id number] Subjective:   Jay Woods is a 23 yr old male who presented on 5/17 to [facility] via law enforcement under IVC for erratic aggressive behavior towards his family, he was admitted to [facility] on 5/18. PPHx is significant for Schizoaffective Disorder, GAD, IED, Polysubstance Abuse (Benzo, Meth, THC), and 4 Prior Psychiatric Hospitalizations (last- [name] 07/2022), and no history of Suicide Attempts or Self Injurious Behavior.    Case was discussed in the multidisciplinary team. MAR was reviewed and patient was compliant with medications.  He received PRN Hydroxyzine  and Trazodone  last night.   Psychiatric Team made the following recommendations yesterday: -Continue Risperdal  2 mg BID this evening for mood stability and psychosis -Increase Depakote  ER to 1000 mg daily for mood stability    On interview today patient reports he slept good last night.  He reports his appetite is doing good.  He reports no SI, HI, or AVH.  He reports no Paranoia or Ideas of Reference.  He reports no issues with his medications.  Patient remains hyperverbal, tangential and states he feels "amazing." Patient has not been agitated and denies SI, HI or AVH. States he spoke with his girlfriend and he might be able to go stay with her. States he plans to stop vaping. Patient showed me a Discussed with him the importance of continuing to take his medications.  He reports no other concerns at present.    Principal Problem: Schizoaffective disorder, bipolar type (HCC) Diagnosis: Principal Problem:   Schizoaffective disorder, bipolar type (HCC)  Total Time spent with patient:  I personally spent 35 minutes on the unit in direct patient care. The direct patient care time included face-to-face time with the patient, reviewing the patient's chart, communicating with other professionals, and coordinating care. Greater than 50% of this time was  spent in counseling or coordinating care with the patient regarding goals of hospitalization, psycho-education, and discharge planning needs.   Past Psychiatric History:  Schizoaffective Disorder, GAD, IED, Polysubstance Abuse (Benzo, Meth, THC), and 4 Prior Psychiatric Hospitalizations (last- [name]/2022), and no history of Suicide Attempts or Self Injurious Behavior.   Past Medical History:  Past Medical History:  Diagnosis Date   Eczema    Schizoaffective disorder (HCC)    History reviewed. No pertinent surgical history. Family History:  Family History  Problem Relation Age of Onset   Healthy Mother    Healthy Father    Family Psychiatric  History:  He reports possible family diagnosis' Reports No Known Suicides or Substance Abuse   Social History:  Social History   Substance and Sexual Activity  Alcohol Use Not Currently   Alcohol/week: 1.0 standard drink of alcohol   Types: 1 Cans of beer per week   Comment: last use 09/30/21     Social History   Substance and Sexual Activity  Drug Use Not Currently   Comment: Hx of cocaine, marijuana, k2, Xanax, and Molly on and off for the past 5 years    Social History   Socioeconomic History   Marital status: Single    Spouse name: Not on file   Number of children: Not on file   Years of education: Not on file   Highest education level: Not on file  Occupational History   Not on file  Tobacco Use   Smoking status: Former    Current packs/day: 0.25    Average packs/day: 0.3 packs/day for 5.0 years (1.3  ttl pk-yrs)    Types: Cigarettes   Smokeless tobacco: Former  Building services engineer status: Every Day   Substances: CBD  Substance and Sexual Activity   Alcohol use: Not Currently    Alcohol/week: 1.0 standard drink of alcohol    Types: 1 Cans of beer per week    Comment: last use 09/30/21   Drug use: Not Currently    Comment: Hx of cocaine, marijuana, k2, Xanax, and Molly on and off for the past 5 years   Sexual  activity: Yes    Partners: Female    Birth control/protection: None  Other Topics Concern   Not on file  Social History Narrative   Not on file   Social Drivers of Health   Financial Resource Strain: High Risk (10/06/2021)   Overall Financial Resource Strain (CARDIA)    Difficulty of Paying Living Expenses: Very hard  Food Insecurity: Food Insecurity Present (07/10/2023)   Hunger Vital Sign    Worried About Running Out of Food in the Last Year: Sometimes true    Ran Out of Food in the Last Year: Sometimes true  Transportation Needs: Unmet Transportation Needs (07/10/2023)   PRAPARE - Transportation    Lack of Transportation (Medical): Yes    Lack of Transportation (Non-Medical): Yes  Physical Activity: Sufficiently Active (10/06/2021)   Exercise Vital Sign    Days of Exercise per Week: 7 days    Minutes of Exercise per Session: 30 min  Stress: No Stress Concern Present (10/06/2021)   [employer] of Occupational Health - Occupational Stress Questionnaire    Feeling of Stress : Only a little  Social Connections: Socially Integrated (10/06/2021)   Social Connection and Isolation Panel [NHANES]    Frequency of Communication with Friends and Family: More than three times a week    Frequency of Social Gatherings with Friends and Family: More than three times a week    Attends Religious Services: More than 4 times per year    Active Member of [employer] or Organizations: No    Attends Engineer, structural: More than 4 times per year    Marital Status: Living with partner   Additional Social History:                         Sleep: Good  Appetite:  Good  Current Medications: Current Facility-Administered Medications  Medication Dose Route Frequency Provider Last Rate Last Admin   acetaminophen  (TYLENOL ) tablet 650 mg  650 mg Oral Q6H PRN Motley-Mangrum, Jadeka A, PMHNP   650 mg at 07/11/23 0030   alum & mag hydroxide-simeth (MAALOX/MYLANTA) 200-200-20 MG/5ML  suspension 30 mL  30 mL Oral Q4H PRN Motley-Mangrum, Jadeka A, PMHNP       haloperidol  (HALDOL ) tablet 5 mg  5 mg Oral TID PRN Motley-Mangrum, Jadeka A, PMHNP   5 mg at 07/11/23 1610   And   diphenhydrAMINE  (BENADRYL ) capsule 50 mg  50 mg Oral TID PRN Motley-Mangrum, Jadeka A, PMHNP   50 mg at 07/11/23 9604   haloperidol  lactate (HALDOL ) injection 5 mg  5 mg Intramuscular TID PRN Motley-Mangrum, Jadeka A, PMHNP       And   diphenhydrAMINE  (BENADRYL ) injection 50 mg  50 mg Intramuscular TID PRN Motley-Mangrum, Jadeka A, PMHNP       And   LORazepam  (ATIVAN ) injection 2 mg  2 mg Intramuscular TID PRN Motley-Mangrum, Jadeka A, PMHNP       haloperidol  lactate (HALDOL )  injection 10 mg  10 mg Intramuscular TID PRN Motley-Mangrum, Jadeka A, PMHNP       And   diphenhydrAMINE  (BENADRYL ) injection 50 mg  50 mg Intramuscular TID PRN Motley-Mangrum, Jadeka A, PMHNP       And   LORazepam  (ATIVAN ) injection 2 mg  2 mg Intramuscular TID PRN Motley-Mangrum, Jadeka A, PMHNP       divalproex  (DEPAKOTE  ER) 24 hr tablet 1,000 mg  1,000 mg Oral Daily Pashayan, Alexander S, DO   1,000 mg at 07/16/23 1610   feeding supplement (ENSURE ENLIVE / ENSURE PLUS) liquid 237 mL  237 mL Oral BID BM Attiah, Nadir, MD   237 mL at 07/16/23 1426   hydrOXYzine  (ATARAX ) tablet 25 mg  25 mg Oral TID PRN Motley-Mangrum, Jadeka A, PMHNP   25 mg at 07/15/23 2102   magnesium  hydroxide (MILK OF MAGNESIA) suspension 30 mL  30 mL Oral Daily PRN Motley-Mangrum, Jadeka A, PMHNP       nicotine  polacrilex (NICORETTE ) gum 2 mg  2 mg Oral Q4H while awake Motley-Mangrum, Jadeka A, PMHNP   2 mg at 07/16/23 0811   [START ON 07/17/2023] risperiDONE  (RISPERDAL  M-TABS) disintegrating tablet 2 mg  2 mg Oral Daily Dossie Ocanas, MD       [START ON 07/17/2023] risperiDONE  (RISPERDAL  M-TABS) disintegrating tablet 3 mg  3 mg Oral QHS Jobani Sabado, MD       traZODone  (DESYREL ) tablet 50 mg  50 mg Oral QHS PRN Ajibola, Ene A, NP   50 mg at 07/15/23 2102     Lab Results:  No results found for this or any previous visit (from the past 48 hours).   Blood Alcohol level:  Lab Results  Component Value Date   [facility] <15 07/10/2023   ETH <10 09/24/2021    Metabolic Disorder Labs: Lab Results  Component Value Date   HGBA1C 5.0 07/12/2023   MPG 96.8 07/12/2023   MPG 108.28 09/24/2021   Lab Results  Component Value Date   PROLACTIN 4.4 06/09/2020   Lab Results  Component Value Date   CHOL 126 07/12/2023   TRIG 48 07/12/2023   HDL 44 07/12/2023   CHOLHDL 2.9 07/12/2023   VLDL 10 07/12/2023   LDLCALC 72 07/12/2023   LDLCALC 81 09/24/2021    Physical Findings: AIMS:  , ,  ,  ,    CIWA:    COWS:     Musculoskeletal: Strength & Muscle Tone: within normal limits Gait & Station: normal Patient leans: N/A  Psychiatric Specialty Exam:  Presentation  General Appearance:  Appropriate for Environment; Casual  Eye Contact: Fair  Speech: Clear and Coherent; Normal Rate  Speech Volume: Normal  Handedness: Ambidextrous   Mood and Affect  Mood: Euthymic  Affect: Full Range   Thought Process  Thought Processes: Goal Directed  Descriptions of Associations:Intact  Orientation:Full (Time, Place and Person)  Thought Content:Scattered  History of Schizophrenia/Schizoaffective disorder:No data recorded Duration of Psychotic Symptoms:No data recorded Hallucinations:Hallucinations: None  Ideas of Reference:None  Suicidal Thoughts:Suicidal Thoughts: No  Homicidal Thoughts:Homicidal Thoughts: No   Sensorium  Memory: Immediate Fair  Judgment: Fair  Insight: Fair   Art therapist  Concentration: Fair  Attention Span: Fair  Recall: [employer] of Knowledge: Fair  Language: Fair   Psychomotor Activity  Psychomotor Activity: Psychomotor Activity: Restlessness   Assets  Assets: Physical Health; Resilience   Sleep  Sleep: Sleep: Good Number of Hours of Sleep:  5.5    Physical Exam: Physical Exam  Vitals and nursing note reviewed.  Constitutional:      General: He is not in acute distress.    Appearance: Normal appearance. He is normal weight. He is not ill-appearing or toxic-appearing.  HENT:     Head: Normocephalic and atraumatic.  Pulmonary:     Effort: Pulmonary effort is normal.  Musculoskeletal:        General: Normal range of motion.  Neurological:     General: No focal deficit present.     Mental Status: He is alert.    Review of Systems  Respiratory:  Negative for cough and shortness of breath.   Cardiovascular:  Negative for chest pain.  Gastrointestinal:  Negative for abdominal pain, constipation, diarrhea, nausea and vomiting.  Neurological:  Negative for dizziness, weakness and headaches.  Psychiatric/Behavioral:  Negative for depression, hallucinations and suicidal ideas. The patient is not nervous/anxious.    Blood pressure (!) 130/90, pulse 97, temperature 97.6 F (36.4 C), temperature source Oral, resp. rate 14, height 5\' 9"  (1.753 m), weight 63 kg, SpO2 100%. Body mass index is 20.53 kg/m.   Treatment Plan Summary: Daily contact with patient to assess and evaluate symptoms and progress in treatment and Medication management  Jay Woods is a 23 yr old male who presented on 5/17 to [facility] via law enforcement under IVC for erratic aggressive behavior towards his family, he was admitted to [facility] on 5/18.  PPHx is significant for Schizoaffective Disorder, GAD, IED, Polysubstance Abuse (Benzo, Meth, THC), and 4 Prior Psychiatric Hospitalizations (last- [name]/2022), and no history of Suicide Attempts or Self Injurious Behavior.     Cree tolerated the increase of the Depakote  made yesterday.  Patient is hyperverbal with elevated mood and pressured speech. Will increase nighttime Risperdal .   We will continue to monitor. Will need collateral from girlfriend.    Schizoaffective Disorder, Bipolar Type, current  episode Manic  IED: -increase Risperdal  2 mg daily and 3 mg at bedtime for mood stability and psychosis -Continue Depakote  ER 1000 mg daily for mood stability -Continue Agitation Protocol: Haldol /Ativan /Benadryl      Nicotine  Dependence: -Continue Nicotine  Gum 2 mg q4 PRN     -Continue Ensure 237 mL BID -Continue PRN's: Tylenol , Maalox, Atarax , Milk of Magnesia   --  The risks/benefits/side-effects/alternatives to medications were discussed in detail with the patient and time was given for questions. The patient consents to medication trials.                -- Metabolic profile and EKG monitoring obtained while on an atypical antipsychotic (BMI: Lipid Panel: HbgA1c: QTc:)              -- Encouraged patient to participate in unit milieu and in scheduled group therapies              -- Short Term Goals: Ability to identify changes in lifestyle to reduce recurrence of condition will improve, Ability to verbalize feelings will improve, Ability to disclose and discuss suicidal ideas, Ability to demonstrate self-control will improve, Ability to identify and develop effective coping behaviors will improve, Ability to maintain clinical measurements within normal limits will improve, Compliance with prescribed medications will improve, and Ability to identify triggers associated with substance abuse/mental health issues will improve             -- Long Term Goals: Improvement in symptoms so as ready for discharge   Safety and Monitoring:             -- Involuntary  admission to inpatient psychiatric unit for safety, stabilization and treatment             -- Daily contact with patient to assess and evaluate symptoms and progress in treatment             -- Patient's case to be discussed in multi-disciplinary team meeting             -- Observation Level : q15 minute checks             -- Vital signs:  q12 hours             -- Precautions: suicide, elopement, and assault  Discharge Planning:               -- Social work and case management to assist with discharge planning and identification of hospital follow-up needs prior to discharge             -- Estimated LOS: 2-4 more days             -- Discharge Concerns: Need to establish a safety plan; Medication compliance and effectiveness             -- Discharge Goals: Return home with outpatient referrals for mental health follow-up including medication management/psychotherapy   Anadelia Kintz, MD 07/16/2023, 8:45 PM

## 2023-07-16 NOTE — [facility] Group Notes (Signed)
 Adult Psychoeducational Group Note  Date:  07/16/2023 Time:  11:17 PM  Group Topic/Focus:  Wrap-Up Group:   The focus of this group is to help patients review their daily goal of treatment and discuss progress on daily workbooks.  Participation Level:  Did Not Attend   Jay Woods 07/16/2023, 11:17 PM

## 2023-07-17 DIAGNOSIS — F250 Schizoaffective disorder, bipolar type: Secondary | ICD-10-CM | POA: Diagnosis not present

## 2023-07-17 MED ORDER — DIVALPROEX SODIUM ER 500 MG PO TB24
1500.0000 mg | ORAL_TABLET | Freq: Every day | ORAL | Status: DC
  Administered 2023-07-18 – 2023-07-19 (×2): 1500 mg via ORAL
  Filled 2023-07-17 (×2): qty 3

## 2023-07-17 MED ORDER — DIVALPROEX SODIUM ER 500 MG PO TB24
500.0000 mg | ORAL_TABLET | Freq: Every day | ORAL | Status: AC
  Administered 2023-07-17: 500 mg via ORAL
  Filled 2023-07-17: qty 1

## 2023-07-17 NOTE — Group Note (Signed)
 Date:  07/17/2023 Time:  9:48 AM  Group Topic/Focus:  Goals Group:   The focus of this group is to help patients establish daily goals to achieve during treatment and discuss how the patient can incorporate goal setting into their daily lives to aide in recovery. Orientation:   The focus of this group is to educate the patient on the purpose and policies of crisis stabilization and provide a format to answer questions about their admission.  The group details unit policies and expectations of patients while admitted.    Participation Level:  Did Not Attend

## 2023-07-17 NOTE — Progress Notes (Signed)
 Pt resting in room.  Interacting appropriately.  Pt rating depression and anxiety at 1 on scale of 1-10.  Pt reports he has been attending groups.  Denied SI, HI and AVH.  Needs assessed.  Pt denied.  Safety checks continue for patient safety.  Pt safe on unit.

## 2023-07-17 NOTE — [facility] Group Notes (Signed)
 [facility] Group Notes:  (Nursing/MHT/Case Management/Adjunct)  Date:  07/17/2023  Time:  2000  Type of Therapy:  Wrap up group  Participation Level:  Minimal  Participation Quality:  Sharing  Affect:  Appropriate  Cognitive:  Alert  Insight:  Improving  Engagement in Group:  Limited  Modes of Intervention:  Clarification, Education, and Support  Summary of Progress/Problems: Pt joined group as it was ending but did answer questions about positive thinking and positive change.  Catharine Clock 07/17/2023, 9:47 PM

## 2023-07-17 NOTE — [facility] Group Notes (Signed)
 LCSW Wellness Group Note   07/17/2023 1:00pm  Type of Group and Topic: Psychoeducational Group:  Wellness  Participation Level:  did not attend  Description of Group  Wellness group introduces the topic and its focus on developing healthy habits across the spectrum and its relationship to a decrease in hospital admissions.  Six areas of wellness are discussed: physical, social spiritual, intellectual, occupational, and emotional.  Patients are asked to consider their current wellness habits and to identify areas of wellness where they are interested and able to focus on improvements.    Therapeutic Goals Patients will understand components of wellness and how they can positively impact overall health.  Patients will identify areas of wellness where they have developed good habits. Patients will identify areas of wellness where they would like to make improvements.    Summary of Patient Progress     Therapeutic Modalities: Cognitive Behavioral Therapy Psychoeducation    Elspeth Hals, LCSW

## 2023-07-17 NOTE — Plan of Care (Signed)
  Problem: Education: Goal: Emotional status will improve Outcome: Progressing Goal: Verbalization of understanding the information provided will improve Outcome: Progressing   Problem: Coping: Goal: Ability to verbalize frustrations and anger appropriately will improve Outcome: Progressing

## 2023-07-17 NOTE — Progress Notes (Signed)
   07/17/23 0800  Psych Admission Type (Psych Patients Only)  Admission Status Involuntary  Psychosocial Assessment  Patient Complaints None  Eye Contact Fair  Facial Expression Animated  Affect Appropriate to circumstance  Speech Logical/coherent  Interaction Assertive  Motor Activity Other (Comment) (WNL)  Appearance/Hygiene Unremarkable  Behavior Characteristics Cooperative  Mood Pleasant;Anxious  Thought Process  Coherency Tangential  Content WDL  Delusions None reported or observed  Perception WDL  Hallucination None reported or observed  Judgment Impaired  Confusion None  Danger to Self  Current suicidal ideation? Denies  Agreement Not to Harm Self Yes  Description of Agreement verbal  Danger to Others  Danger to Others None reported or observed     07/17/23 0800  Psych Admission Type (Psych Patients Only)  Admission Status Involuntary  Psychosocial Assessment  Patient Complaints None  Eye Contact Fair  Facial Expression Animated  Affect Appropriate to circumstance  Speech Logical/coherent  Interaction Assertive  Motor Activity Other (Comment) (WNL)  Appearance/Hygiene Unremarkable  Behavior Characteristics Cooperative  Mood Pleasant;Anxious  Thought Process  Coherency Tangential  Content WDL  Delusions None reported or observed  Perception WDL  Hallucination None reported or observed  Judgment Impaired  Confusion None  Danger to Self  Current suicidal ideation? Denies  Agreement Not to Harm Self Yes  Description of Agreement verbal  Danger to Others  Danger to Others None reported or observed

## 2023-07-17 NOTE — Progress Notes (Signed)
 [facility] MD Progress Note  07/17/2023 3:47 PM Jay Woods  MRN:  [id number] Subjective:   Jay Woods is a 23 yr old male who presented on 5/17 to [facility] via law enforcement under IVC for erratic aggressive behavior towards his family, he was admitted to [facility] on 5/18. PPHx is significant for Schizoaffective Disorder, GAD, IED, Polysubstance Abuse (Benzo, Meth, THC), and 4 Prior Psychiatric Hospitalizations (last- [name] 07/2022), and no history of Suicide Attempts or Self Injurious Behavior.    Case was discussed in the multidisciplinary team. MAR was reviewed and patient was compliant with medications.  He received PRN Hydroxyzine  and Trazodone  last night.   Psychiatric Team made the following recommendations yesterday: -Increase Risperdal  2 mg AM and 3mg  at bedtime for mood stability and psychosis - Continue Depakote  ER to 1000 mg daily for mood stability    On interview today patient reports he slept good last night.  He reports his appetite is doing good.  He reports no SI, HI, or AVH.  He reports no Paranoia or Ideas of Reference.  He reports no issues with his medications.  Patient remains hyperverbal and a little tangential. Pt also has moments were his thought content appears scattered but is easily redirectable. Pt reports that he woke up and did 100 push-ups this AM which is not normal for him. Pt denies muscle aches currently. Pt reports that he received his meds after and then took a 1 hr nap. Pt reported that he felt like doing 100 more push-ups after assessment ended. Pt went on to talk about how he wanted to look like Betty Bruckner or a soccer player. When asked if pt had ever been a soccer player, pt endorsed yes when he was a teenager and proceeded to give a hyper verbal response that was a bit scattered and vague relating to his soccer hx. Pt then went on to talk a recent movie and how he suddenly felt he wanted to be in it and be an actor, because he liked the movie so  much. Pt denied having racing thoughts today and endorsed a good appetite. Pt was endorsed that the 7 cups in his room were all from today and yesterday and he has them lined up on a bench. Patient denies SI, HI, and AVH.     Principal Problem: Schizoaffective disorder, bipolar type (HCC) Diagnosis: Principal Problem:   Schizoaffective disorder, bipolar type (HCC)  Total Time spent with patient:  I personally spent 25 minutes on the unit in direct patient care. The direct patient care time included face-to-face time with the patient, reviewing the patient's chart, communicating with other professionals, and coordinating care. Greater than 50% of this time was spent in counseling or coordinating care with the patient regarding goals of hospitalization, psycho-education, and discharge planning needs.   Past Psychiatric History:  Schizoaffective Disorder, GAD, IED, Polysubstance Abuse (Benzo, Meth, THC), and 4 Prior Psychiatric Hospitalizations (last- [name]/2022), and no history of Suicide Attempts or Self Injurious Behavior.   Past Medical History:  Past Medical History:  Diagnosis Date   Eczema    Schizoaffective disorder (HCC)    History reviewed. No pertinent surgical history. Family History:  Family History  Problem Relation Age of Onset   Healthy Mother    Healthy Father    Family Psychiatric  History:  He reports possible family diagnosis' Reports No Known Suicides or Substance Abuse   Social History:  Social History   Substance and Sexual Activity  Alcohol Use  Not Currently   Alcohol/week: 1.0 standard drink of alcohol   Types: 1 Cans of beer per week   Comment: last use 09/30/21     Social History   Substance and Sexual Activity  Drug Use Not Currently   Comment: Hx of cocaine, marijuana, k2, Xanax, and Molly on and off for the past 5 years    Social History   Socioeconomic History   Marital status: Single    Spouse name: Not on file   Number of  children: Not on file   Years of education: Not on file   Highest education level: Not on file  Occupational History   Not on file  Tobacco Use   Smoking status: Former    Current packs/day: 0.25    Average packs/day: 0.3 packs/day for 5.0 years (1.3 ttl pk-yrs)    Types: Cigarettes   Smokeless tobacco: Former  Building services engineer status: Every Day   Substances: CBD  Substance and Sexual Activity   Alcohol use: Not Currently    Alcohol/week: 1.0 standard drink of alcohol    Types: 1 Cans of beer per week    Comment: last use 09/30/21   Drug use: Not Currently    Comment: Hx of cocaine, marijuana, k2, Xanax, and Molly on and off for the past 5 years   Sexual activity: Yes    Partners: Female    Birth control/protection: None  Other Topics Concern   Not on file  Social History Narrative   Not on file   Social Drivers of Health   Financial Resource Strain: High Risk (10/06/2021)   Overall Financial Resource Strain (CARDIA)    Difficulty of Paying Living Expenses: Very hard  Food Insecurity: Food Insecurity Present (07/10/2023)   Hunger Vital Sign    Worried About Running Out of Food in the Last Year: Sometimes true    Ran Out of Food in the Last Year: Sometimes true  Transportation Needs: Unmet Transportation Needs (07/10/2023)   PRAPARE - Transportation    Lack of Transportation (Medical): Yes    Lack of Transportation (Non-Medical): Yes  Physical Activity: Sufficiently Active (10/06/2021)   Exercise Vital Sign    Days of Exercise per Week: 7 days    Minutes of Exercise per Session: 30 min  Stress: No Stress Concern Present (10/06/2021)   [employer] of Occupational Health - Occupational Stress Questionnaire    Feeling of Stress : Only a little  Social Connections: Socially Integrated (10/06/2021)   Social Connection and Isolation Panel [NHANES]    Frequency of Communication with Friends and Family: More than three times a week    Frequency of Social Gatherings with  Friends and Family: More than three times a week    Attends Religious Services: More than 4 times per year    Active Member of [employer] or Organizations: No    Attends Engineer, structural: More than 4 times per year    Marital Status: Living with partner   Additional Social History:                         Sleep: Good  Appetite:  Good  Current Medications: Current Facility-Administered Medications  Medication Dose Route Frequency Provider Last Rate Last Admin   acetaminophen  (TYLENOL ) tablet 650 mg  650 mg Oral Q6H PRN Motley-Mangrum, Jadeka A, PMHNP   650 mg at 07/11/23 0030   alum & mag hydroxide-simeth (MAALOX/MYLANTA) 200-200-20 MG/5ML suspension 30 mL  30 mL Oral Q4H PRN Motley-Mangrum, Jadeka A, PMHNP       haloperidol  (HALDOL ) tablet 5 mg  5 mg Oral TID PRN Motley-Mangrum, Jadeka A, PMHNP   5 mg at 07/11/23 1610   And   diphenhydrAMINE  (BENADRYL ) capsule 50 mg  50 mg Oral TID PRN Motley-Mangrum, Jadeka A, PMHNP   50 mg at 07/11/23 9604   haloperidol  lactate (HALDOL ) injection 5 mg  5 mg Intramuscular TID PRN Motley-Mangrum, Jadeka A, PMHNP       And   diphenhydrAMINE  (BENADRYL ) injection 50 mg  50 mg Intramuscular TID PRN Motley-Mangrum, Jadeka A, PMHNP       And   LORazepam  (ATIVAN ) injection 2 mg  2 mg Intramuscular TID PRN Motley-Mangrum, Jadeka A, PMHNP       haloperidol  lactate (HALDOL ) injection 10 mg  10 mg Intramuscular TID PRN Motley-Mangrum, Jadeka A, PMHNP       And   diphenhydrAMINE  (BENADRYL ) injection 50 mg  50 mg Intramuscular TID PRN Motley-Mangrum, Jadeka A, PMHNP       And   LORazepam  (ATIVAN ) injection 2 mg  2 mg Intramuscular TID PRN Motley-Mangrum, Jadeka A, PMHNP       [START ON 07/18/2023] divalproex  (DEPAKOTE  ER) 24 hr tablet 1,500 mg  1,500 mg Oral Q2000 Platon Arocho B, MD       feeding supplement (ENSURE ENLIVE / ENSURE PLUS) liquid 237 mL  237 mL Oral BID BM Attiah, Nadir, MD   237 mL at 07/17/23 1022   hydrOXYzine  (ATARAX ) tablet 25  mg  25 mg Oral TID PRN Motley-Mangrum, Jadeka A, PMHNP   25 mg at 07/15/23 2102   magnesium  hydroxide (MILK OF MAGNESIA) suspension 30 mL  30 mL Oral Daily PRN Motley-Mangrum, Jadeka A, PMHNP       nicotine  polacrilex (NICORETTE ) gum 2 mg  2 mg Oral Q4H while awake Motley-Mangrum, Jadeka A, PMHNP   2 mg at 07/17/23 1143   risperiDONE  (RISPERDAL  M-TABS) disintegrating tablet 2 mg  2 mg Oral Daily Zouev, Dmitri, MD   2 mg at 07/17/23 0753   risperiDONE  (RISPERDAL  M-TABS) disintegrating tablet 3 mg  3 mg Oral QHS Zouev, Dmitri, MD       traZODone  (DESYREL ) tablet 50 mg  50 mg Oral QHS PRN Ajibola, Ene A, NP   50 mg at 07/17/23 0033    Lab Results:  No results found for this or any previous visit (from the past 48 hours).   Blood Alcohol level:  Lab Results  Component Value Date   [facility] <15 07/10/2023   ETH <10 09/24/2021    Metabolic Disorder Labs: Lab Results  Component Value Date   HGBA1C 5.0 07/12/2023   MPG 96.8 07/12/2023   MPG 108.28 09/24/2021   Lab Results  Component Value Date   PROLACTIN 4.4 06/09/2020   Lab Results  Component Value Date   CHOL 126 07/12/2023   TRIG 48 07/12/2023   HDL 44 07/12/2023   CHOLHDL 2.9 07/12/2023   VLDL 10 07/12/2023   LDLCALC 72 07/12/2023   LDLCALC 81 09/24/2021    Physical Findings: AIMS:  , ,  ,  ,    CIWA:    COWS:     Musculoskeletal: Strength & Muscle Tone: within normal limits Gait & Station: normal Patient leans: N/A  Psychiatric Specialty Exam:  Presentation  General Appearance:  Appropriate for Environment; Casual  Eye Contact: Good  Speech: Clear and Coherent  Speech Volume: Normal  Handedness: Ambidextrous   Mood  and Affect  Mood: Euthymic  Affect: Appropriate   Thought Process  Thought Processes: Goal Directed  Descriptions of Associations:Intact  Orientation:Full (Time, Place and Person)  Thought Content:Scattered  History of Schizophrenia/Schizoaffective disorder:No data  recorded Duration of Psychotic Symptoms:No data recorded Hallucinations:Hallucinations: None  Ideas of Reference:None  Suicidal Thoughts:Suicidal Thoughts: No  Homicidal Thoughts:Homicidal Thoughts: No   Sensorium  Memory: Immediate Good; Recent Good  Judgment: Fair  Insight: Shallow   Executive Functions  Concentration: Fair  Attention Span: Fair  Recall: Good  Fund of Knowledge: Fair  Language: Good   Psychomotor Activity  Psychomotor Activity: Psychomotor Activity: Restlessness   Assets  Assets: Desire for Improvement; Communication Skills; Resilience   Sleep  Sleep: Sleep: Fair    Physical Exam: Physical Exam Vitals and nursing note reviewed.  Constitutional:      General: He is not in acute distress.    Appearance: Normal appearance. He is normal weight. He is not ill-appearing or toxic-appearing.  HENT:     Head: Normocephalic and atraumatic.  Pulmonary:     Effort: Pulmonary effort is normal.  Musculoskeletal:        General: Normal range of motion.  Neurological:     General: No focal deficit present.     Mental Status: He is alert.    Review of Systems  Respiratory:  Negative for cough and shortness of breath.   Cardiovascular:  Negative for chest pain.  Gastrointestinal:  Negative for abdominal pain, constipation, diarrhea, nausea and vomiting.  Neurological:  Negative for dizziness, weakness and headaches.  Psychiatric/Behavioral:  Negative for depression, hallucinations and suicidal ideas. The patient is not nervous/anxious.    Blood pressure (!) 133/91, pulse 79, temperature 97.6 F (36.4 C), temperature source Oral, resp. rate 16, height 5\' 9"  (1.753 m), weight 63 kg, SpO2 100%. Body mass index is 20.53 kg/m.   Treatment Plan Summary: Daily contact with patient to assess and evaluate symptoms and progress in treatment and Medication management  Jay Woods is a 23 yr old male who presented on 5/17 to [facility]  via law enforcement under IVC for erratic aggressive behavior towards his family, he was admitted to [facility] on 5/18.  PPHx is significant for Schizoaffective Disorder, GAD, IED, Polysubstance Abuse (Benzo, Meth, THC), and 4 Prior Psychiatric Hospitalizations (last- [name]/2022), and no history of Suicide Attempts or Self Injurious Behavior.     Jay tolerated the increase of the Depakote  and risperdal  but still appears elevated and with high energy and tangential. Will increase depakote  to 1500mg .     Schizoaffective Disorder, Bipolar Type, current episode Manic  IED: -Continue Risperdal  2 mg daily and 3 mg at bedtime for mood stability and psychosis -Increase  Depakote  ER to 1500 mg daily for mood stability -- Depakote  lvl and CMP 5/27 -Continue Agitation Protocol: Haldol /Ativan /Benadryl      Nicotine  Dependence: -Continue Nicotine  Gum 2 mg q4 PRN     -Continue Ensure 237 mL BID -Continue PRN's: Tylenol , Maalox, Atarax , Milk of Magnesia   --  The risks/benefits/side-effects/alternatives to medications were discussed in detail with the patient and time was given for questions. The patient consents to medication trials.                -- Metabolic profile and EKG monitoring obtained while on an atypical antipsychotic (BMI: Lipid Panel: HbgA1c: QTc:)              -- Encouraged patient to participate in unit milieu and in scheduled group therapies              --  Short Term Goals: Ability to identify changes in lifestyle to reduce recurrence of condition will improve, Ability to verbalize feelings will improve, Ability to disclose and discuss suicidal ideas, Ability to demonstrate self-control will improve, Ability to identify and develop effective coping behaviors will improve, Ability to maintain clinical measurements within normal limits will improve, Compliance with prescribed medications will improve, and Ability to identify triggers associated with substance abuse/mental health  issues will improve             -- Long Term Goals: Improvement in symptoms so as ready for discharge   Safety and Monitoring:             -- Involuntary admission to inpatient psychiatric unit for safety, stabilization and treatment             -- Daily contact with patient to assess and evaluate symptoms and progress in treatment             -- Patient's case to be discussed in multi-disciplinary team meeting             -- Observation Level : q15 minute checks             -- Vital signs:  q12 hours             -- Precautions: suicide, elopement, and assault  Discharge Planning:              -- Social work and case management to assist with discharge planning and identification of hospital follow-up needs prior to discharge             -- Estimated LOS: 2-4 more days             -- Discharge Concerns: Need to establish a safety plan; Medication compliance and effectiveness             -- Discharge Goals: Return home with outpatient referrals for mental health follow-up including medication management/psychotherapy   Tamera Falco, MD 07/17/2023, 3:47 PM

## 2023-07-18 NOTE — Progress Notes (Signed)
 [facility] MD Progress Note  07/18/2023 10:49 AM Jay Woods  MRN:  [id number] Subjective:   Jay Woods is a 23 yr old male who presented on 5/17 to [facility] via law enforcement under IVC for erratic aggressive behavior towards his family, he was admitted to [facility] on 5/18. PPHx is significant for Schizoaffective Disorder, GAD, IED, Polysubstance Abuse (Benzo, Meth, THC), and 4 Prior Psychiatric Hospitalizations (last- [name] 07/2022), and no history of Suicide Attempts or Self Injurious Behavior.    Case was discussed in the multidisciplinary team. MAR was reviewed and patient was compliant with medications.  He received PRN Hydroxyzine  and Trazodone  last night.   Psychiatric Team made the following recommendations yesterday: -Continue Risperdal  2 mg AM and 3mg  at bedtime for mood stability and psychosis - Increase Depakote  ER to 1500 mg daily for mood stability    On interview today patient reports he slept good last night.  He reports his appetite is doing good.  He reports no SI, HI, or AVH.  He reports no Paranoia or Ideas of Reference.  He reports no issues with his medications.  Patient denies increased energy today. Pt reports that he has done push-ups today and last did 30 last night. Pt reports that he slept well ON. Pt endorses a good appetite. Pt reports that his mood is "good." Pt reports that today he is "trying to get more rest" before eventual discharge. Pt reports that he intends to go back to work so he can provide for his sons. Pt not able to recall feeling like he wanted to be an actor, yesterday. Pt did briefly go on a tangent about his father's business in [place], where he works. 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   Pt endorsed he will call some family today to figure out a dispo. Pt  reports that he had multiple thoughts but has not directly asked anyone. Pt denied SI, HI, and AVH. Pt was able to endorse that THC negatively contributed to his presentation and that he should not use it again.   GF- Yvette Called GF to let let him know his medications and f/u plan. GF endorsed understanding.  Principal Problem: Schizoaffective disorder, bipolar type (HCC) Diagnosis: Principal Problem:   Schizoaffective disorder, bipolar type (HCC)  Total Time spent with patient:  I personally spent 25 minutes on the unit in direct patient care. The direct patient care time included face-to-face time with the patient, reviewing the patient's chart, communicating with other professionals, and coordinating care. Greater than 50% of this time was spent in counseling or coordinating care with the patient regarding goals of hospitalization, psycho-education, and discharge planning needs.   Past Psychiatric History:  Schizoaffective Disorder, GAD, IED, Polysubstance Abuse (Benzo, Meth, THC), and 4 Prior Psychiatric Hospitalizations (last- [name]/2022), and no history of Suicide Attempts or Self Injurious Behavior.   Past Medical History:  Past Medical History:  Diagnosis Date   Eczema    Schizoaffective disorder (HCC)    History reviewed. No pertinent surgical history. Family History:  Family History  Problem Relation Age of Onset   Healthy Mother    Healthy Father    Family Psychiatric  History:  He reports possible family diagnosis' Reports No Known Suicides or Substance Abuse   Social History:  Social History   Substance and Sexual Activity  Alcohol Use Not Currently   Alcohol/week: 1.0 standard drink of alcohol   Types: 1 Cans of beer per week   Comment: last use 09/30/21     Social History   Substance and Sexual Activity  Drug Use Not Currently   Comment: Hx of cocaine, marijuana, k2, Xanax, and Molly on and off for the past 5 years    Social History    Socioeconomic History   Marital status: Single    Spouse name: Not on file   Number of children: Not on file   Years of education: Not on file   Highest education level: Not on file  Occupational History   Not on file  Tobacco Use   Smoking status: Former    Current packs/day: 0.25    Average packs/day: 0.3 packs/day for 5.0 years (1.3 ttl pk-yrs)    Types: Cigarettes   Smokeless tobacco: Former  Building services engineer status: Every Day   Substances: CBD  Substance and Sexual Activity   Alcohol use: Not Currently    Alcohol/week: 1.0 standard drink of alcohol    Types: 1 Cans of beer per week    Comment: last use 09/30/21   Drug use: Not Currently    Comment: Hx of cocaine, marijuana, k2, Xanax, and Molly on and off for the past 5 years   Sexual activity: Yes    Partners: Female    Birth control/protection: None  Other Topics Concern   Not on file  Social History Narrative   Not on file   Social Drivers of Health   Financial Resource Strain: High Risk (10/06/2021)   Overall Financial Resource Strain (CARDIA)    Difficulty of Paying Living Expenses: Very hard  Food Insecurity: Food Insecurity Present (07/10/2023)   Hunger Vital Sign    Worried About Running Out of Food in the Last Year: Sometimes true    Ran Out of Food in the Last Year: Sometimes true  Transportation Needs: Unmet Transportation Needs (07/10/2023)   PRAPARE - Transportation    Lack of Transportation (Medical): Yes    Lack of Transportation (Non-Medical): Yes  Physical Activity: Sufficiently Active (10/06/2021)   Exercise Vital Sign    Days of Exercise per Week: 7 days    Minutes of Exercise per Session: 30 min  Stress: No Stress Concern Present (10/06/2021)   [employer] of Occupational Health - Occupational Stress Questionnaire    Feeling of Stress : Only a little  Social Connections: Socially Integrated (10/06/2021)   Social Connection and Isolation Panel [NHANES]    Frequency of Communication  with Friends and Family: More than three times a week    Frequency of Social Gatherings with Friends and Family: More than three times a week    Attends Religious Services: More than 4 times per year    Active Member of [employer] or Organizations: No    Attends Engineer, structural: More than 4 times per year    Marital Status: Living with partner   Additional Social History:                         Sleep: Good  Appetite:  Good  Current Medications: Current Facility-Administered Medications  Medication Dose Route Frequency Provider Last Rate Last Admin   acetaminophen  (TYLENOL ) tablet 650 mg  650 mg Oral Q6H PRN Motley-Mangrum, Jadeka A, PMHNP   650 mg at 07/11/23 0030  alum & mag hydroxide-simeth (MAALOX/MYLANTA) 200-200-20 MG/5ML suspension 30 mL  30 mL Oral Q4H PRN Motley-Mangrum, Jadeka A, PMHNP       haloperidol  (HALDOL ) tablet 5 mg  5 mg Oral TID PRN Motley-Mangrum, Jadeka A, PMHNP   5 mg at 07/11/23 6295   And   diphenhydrAMINE  (BENADRYL ) capsule 50 mg  50 mg Oral TID PRN Motley-Mangrum, Jadeka A, PMHNP   50 mg at 07/11/23 2841   haloperidol  lactate (HALDOL ) injection 5 mg  5 mg Intramuscular TID PRN Motley-Mangrum, Jadeka A, PMHNP       And   diphenhydrAMINE  (BENADRYL ) injection 50 mg  50 mg Intramuscular TID PRN Motley-Mangrum, Jadeka A, PMHNP       And   LORazepam  (ATIVAN ) injection 2 mg  2 mg Intramuscular TID PRN Motley-Mangrum, Jadeka A, PMHNP       haloperidol  lactate (HALDOL ) injection 10 mg  10 mg Intramuscular TID PRN Motley-Mangrum, Jadeka A, PMHNP       And   diphenhydrAMINE  (BENADRYL ) injection 50 mg  50 mg Intramuscular TID PRN Motley-Mangrum, Jadeka A, PMHNP       And   LORazepam  (ATIVAN ) injection 2 mg  2 mg Intramuscular TID PRN Motley-Mangrum, Jadeka A, PMHNP       divalproex  (DEPAKOTE  ER) 24 hr tablet 1,500 mg  1,500 mg Oral Q2000 Alianny Toelle B, MD       feeding supplement (ENSURE ENLIVE / ENSURE PLUS) liquid 237 mL  237 mL Oral BID BM  Attiah, Nadir, MD   237 mL at 07/17/23 1558   hydrOXYzine  (ATARAX ) tablet 25 mg  25 mg Oral TID PRN Motley-Mangrum, Jadeka A, PMHNP   25 mg at 07/15/23 2102   magnesium  hydroxide (MILK OF MAGNESIA) suspension 30 mL  30 mL Oral Daily PRN Motley-Mangrum, Jadeka A, PMHNP       nicotine  polacrilex (NICORETTE ) gum 2 mg  2 mg Oral Q4H while awake Motley-Mangrum, Jadeka A, PMHNP   2 mg at 07/17/23 2102   risperiDONE  (RISPERDAL  M-TABS) disintegrating tablet 2 mg  2 mg Oral Daily Zouev, Dmitri, MD   2 mg at 07/18/23 0827   risperiDONE  (RISPERDAL  M-TABS) disintegrating tablet 3 mg  3 mg Oral QHS Zouev, Dmitri, MD   3 mg at 07/17/23 2102   traZODone  (DESYREL ) tablet 50 mg  50 mg Oral QHS PRN Ajibola, Ene A, NP   50 mg at 07/17/23 2101    Lab Results:  No results found for this or any previous visit (from the past 48 hours).   Blood Alcohol level:  Lab Results  Component Value Date   [facility] <15 07/10/2023   ETH <10 09/24/2021    Metabolic Disorder Labs: Lab Results  Component Value Date   HGBA1C 5.0 07/12/2023   MPG 96.8 07/12/2023   MPG 108.28 09/24/2021   Lab Results  Component Value Date   PROLACTIN 4.4 06/09/2020   Lab Results  Component Value Date   CHOL 126 07/12/2023   TRIG 48 07/12/2023   HDL 44 07/12/2023   CHOLHDL 2.9 07/12/2023   VLDL 10 07/12/2023   LDLCALC 72 07/12/2023   LDLCALC 81 09/24/2021    Physical Findings: AIMS:  , ,  ,  ,    CIWA:    COWS:     Musculoskeletal: Strength & Muscle Tone: within normal limits Gait & Station: normal Patient leans: N/A  Psychiatric Specialty Exam:  Presentation  General Appearance:  Appropriate for Environment; Casual  Eye Contact: Good  Speech: Clear and  Coherent  Speech Volume: Normal  Handedness: Ambidextrous   Mood and Affect  Mood: Euthymic  Affect: Appropriate   Thought Process  Thought Processes: Goal Directed  Descriptions of Associations:Intact  Orientation:Full (Time, Place and  Person)  Thought Content:Logical  History of Schizophrenia/Schizoaffective disorder:No data recorded Duration of Psychotic Symptoms:No data recorded Hallucinations:Hallucinations: None  Ideas of Reference:None  Suicidal Thoughts:Suicidal Thoughts: No  Homicidal Thoughts:Homicidal Thoughts: No   Sensorium  Memory: Immediate Good; Recent Good  Judgment: Fair  Insight: Fair   Art therapist  Concentration: Fair  Attention Span: Fair  Recall: Fair  Fund of Knowledge: Fair  Language: Fair   Psychomotor Activity  Psychomotor Activity: Psychomotor Activity: Normal   Assets  Assets: Desire for Improvement; Resilience   Sleep  Sleep: Sleep: Fair Number of Hours of Sleep: 8.25    Physical Exam: Physical Exam Vitals and nursing note reviewed.  Constitutional:      General: He is not in acute distress.    Appearance: Normal appearance. He is normal weight. He is not ill-appearing or toxic-appearing.  HENT:     Head: Normocephalic and atraumatic.  Pulmonary:     Effort: Pulmonary effort is normal.  Musculoskeletal:        General: Normal range of motion.  Neurological:     General: No focal deficit present.     Mental Status: He is alert.    Review of Systems  Respiratory:  Negative for cough and shortness of breath.   Cardiovascular:  Negative for chest pain.  Gastrointestinal:  Negative for abdominal pain, constipation, diarrhea, nausea and vomiting.  Neurological:  Negative for dizziness, weakness and headaches.  Psychiatric/Behavioral:  Negative for depression, hallucinations and suicidal ideas. The patient is not nervous/anxious.    Blood pressure 132/88, pulse 80, temperature 97.6 F (36.4 C), temperature source Oral, resp. rate 16, height 5\' 9"  (1.753 m), weight 63 kg, SpO2 100%. Body mass index is 20.53 kg/m.   Treatment Plan Summary: Daily contact with patient to assess and evaluate symptoms and progress in treatment and  Medication management  Jay Woods is a 23 yr old male who presented on 5/17 to [facility] via law enforcement under IVC for erratic aggressive behavior towards his family, he was admitted to [facility] on 5/18.  PPHx is significant for Schizoaffective Disorder, GAD, IED, Polysubstance Abuse (Benzo, Meth, THC), and 4 Prior Psychiatric Hospitalizations (last- [name]/2022), and no history of Suicide Attempts or Self Injurious Behavior.     Appears to be more logical and less restless, but still some grandiosity. Depakote  will moved to night time dosing.     Schizoaffective Disorder, Bipolar Type, current episode Manic  IED: -Continue Risperdal  2 mg daily and 3 mg at bedtime for mood stability and psychosis -Continue  Depakote  ER to 1500 mg daily for mood stability -- Depakote  lvl and CMP 5/27 -Continue Agitation Protocol: Haldol /Ativan /Benadryl      Nicotine  Dependence: -Continue Nicotine  Gum 2 mg q4 PRN     -Continue Ensure 237 mL BID -Continue PRN's: Tylenol , Maalox, Atarax , Milk of Magnesia   --  The risks/benefits/side-effects/alternatives to medications were discussed in detail with the patient and time was given for questions. The patient consents to medication trials.                -- Metabolic profile and EKG monitoring obtained while on an atypical antipsychotic (BMI: Lipid Panel: HbgA1c: QTc:)              -- Encouraged patient to participate  in unit milieu and in scheduled group therapies              -- Short Term Goals: Ability to identify changes in lifestyle to reduce recurrence of condition will improve, Ability to verbalize feelings will improve, Ability to disclose and discuss suicidal ideas, Ability to demonstrate self-control will improve, Ability to identify and develop effective coping behaviors will improve, Ability to maintain clinical measurements within normal limits will improve, Compliance with prescribed medications will improve, and Ability to identify  triggers associated with substance abuse/mental health issues will improve             -- Long Term Goals: Improvement in symptoms so as ready for discharge   Safety and Monitoring:             -- Involuntary admission to inpatient psychiatric unit for safety, stabilization and treatment             -- Daily contact with patient to assess and evaluate symptoms and progress in treatment             -- Patient's case to be discussed in multi-disciplinary team meeting             -- Observation Level : q15 minute checks             -- Vital signs:  q12 hours             -- Precautions: suicide, elopement, and assault  Discharge Planning:              -- Social work and case management to assist with discharge planning and identification of hospital follow-up needs prior to discharge             -- Estimated LOS: 2-4 more days             -- Discharge Concerns: Need to establish a safety plan; Medication compliance and effectiveness             -- Discharge Goals: Return home with outpatient referrals for mental health follow-up including medication management/psychotherapy   Tamera Falco, MD 07/18/2023, 10:49 AM

## 2023-07-18 NOTE — BHH Counselor (Signed)
 CSW spoke to pt regarding his disposition per provider's request.   Pt reports that he can go to the mother of his child's home at discharge and if he doesn't go there then he will go to his mom's house.   CSW contacted pt's Jodeen Munch [phone]), pt's child's mother, per pt's request.   Jodeen Munch reports she does not have no problem with him coming to her home, but reports he needs to be stable to come there.   Jodeen Munch reports he needs to call her family to talk it over with them, stop smoking, stop vaping, apologize to her little brother. Jodeen Munch reports the address as [address], [place] .   Jodeen Munch reports that if does come back to her family's home she would be able to pick him up after 5:00 PM.    Jodeen Munch reports that he pt's mom does not want him to go to her home (mom's home), stating "She didn't want him to go over there, simply because of how everything went down. "   Jodeen Munch has concerns about pt's mental status and medications and wants to have a conversation with the doctor before pt discharges to their home.   Jodeen Munch questions what other options pt would have. CSW explained that the only option would be a shelter. CSW did inform Jodeen Munch that the [facility] is not overnight but that pt could try a shelter like [facility] in [place] to see if beds are available.   Yvette verbalized understanding.   CSW informed provider of conversation.   Derrill Flirt, MSW, [place] 07/18/2023 10:29 AM

## 2023-07-18 NOTE — B[facility] Group Notes (Signed)
 [facility] Group Notes:  (Nursing/MHT/Case Management/Adjunct)  Date:  07/18/2023  Time:  9:41 PM  Type of Therapy:  Wrap-up group  Participation Level:  Did Not Attend  Participation Quality:    Affect:    Cognitive:    Insight:    Engagement in Group:    Modes of Intervention:    Summary of Progress/Problems: Refused to attend group.  Alvaro Augusta 07/18/2023, 9:41 PM

## 2023-07-18 NOTE — Group Note (Signed)
 Date:  07/18/2023 Time:  5:16 PM  Group Topic/Focus:  Goals Group:   The focus of this group is to help patients establish daily goals to achieve during treatment and discuss how the patient can incorporate goal setting into their daily lives to aide in recovery. Orientation:   The focus of this group is to educate the patient on the purpose and policies of crisis stabilization and provide a format to answer questions about their admission.  The group details unit policies and expectations of patients while admitted.    Participation Level:  Did Not Attend   Sheryl Donna 07/18/2023, 5:16 PM

## 2023-07-18 NOTE — Plan of Care (Signed)
 Pt A/O x4 with noted Schizoaffective Disorder/Polysubstance Use/Bipolar/Aggression. Pt denied SI/HI; A/V/H. Pt agreed to notify staff if thoughts become overwhelming. Meds whole. Snacks/Fluids offered. ADL's self. Ambulate with a steady gait. Pt mood: Calm. Pt was encouraged to attend Group. Tolerated well. Pt in bed eyes closed with equal rise and fall of chest. No distress noted Continuing of care continues during 7p-7a shift.

## 2023-07-18 NOTE — Group Note (Signed)
 Date:  07/18/2023 Time:  5:11 PM  Group Topic/Focus:   Healthy Communication:   The focus of this group is to discuss boundaries, barriers to setting effective boundaries, as well as healthy ways to communicate them with others.     Participation Level:  Did Not Attend   Sheryl Donna 07/18/2023, 5:11 PM

## 2023-07-18 NOTE — Progress Notes (Signed)
   07/17/23 2040  Psych Admission Type (Psych Patients Only)  Admission Status Involuntary  Psychosocial Assessment  Patient Complaints None  Eye Contact Fair  Facial Expression Other (Comment) (WNL)  Affect Appropriate to circumstance  Speech UTA  Interaction Other (Comment) (WNL)  Motor Activity Other (Comment) (WNL)  Appearance/Hygiene Other (Comment) (WNL)  Behavior Characteristics Cooperative;Calm  Mood Pleasant  Thought Process  Coherency Unable to assess  Content WDL  Delusions None reported or observed  Perception WDL  Hallucination None reported or observed  Judgment UTA  Confusion None  Danger to Self  Current suicidal ideation? Denies (Denies)  Agreement Not to Harm Self Yes  Description of Agreement Notify Staff  Danger to Others  Danger to Others None reported or observed

## 2023-07-18 NOTE — Progress Notes (Signed)
   07/18/23 1300  Psych Admission Type (Psych Patients Only)  Admission Status Involuntary  Psychosocial Assessment  Patient Complaints None  Eye Contact Fair  Facial Expression Animated  Affect Appropriate to circumstance  Speech Logical/coherent  Interaction Minimal  Motor Activity Other (Comment) (WNL)  Appearance/Hygiene Unremarkable  Behavior Characteristics Cooperative;Calm  Mood Pleasant  Thought Process  Coherency Tangential  Content WDL  Delusions None reported or observed  Perception WDL  Hallucination None reported or observed  Judgment Impaired  Confusion None  Danger to Self  Current suicidal ideation? Denies  Agreement Not to Harm Self Yes  Description of Agreement Verbal Contract  Danger to Others  Danger to Others None reported or observed

## 2023-07-18 NOTE — Plan of Care (Signed)
  Problem: Education: Goal: Mental status will improve Outcome: Not Progressing   Problem: Activity: Goal: Interest or engagement in activities will improve Outcome: Not Progressing

## 2023-07-19 DIAGNOSIS — F250 Schizoaffective disorder, bipolar type: Secondary | ICD-10-CM | POA: Diagnosis not present

## 2023-07-19 MED ORDER — RISPERIDONE 1 MG PO TBDP
1.0000 mg | ORAL_TABLET | Freq: Once | ORAL | Status: AC
  Administered 2023-07-19: 1 mg via ORAL
  Filled 2023-07-19: qty 1

## 2023-07-19 MED ORDER — RISPERIDONE 3 MG PO TBDP
3.0000 mg | ORAL_TABLET | Freq: Every day | ORAL | Status: DC
  Administered 2023-07-20 – 2023-07-25 (×6): 3 mg via ORAL
  Filled 2023-07-19 (×6): qty 1

## 2023-07-19 NOTE — Progress Notes (Signed)
   07/19/23 0900  Psych Admission Type (Psych Patients Only)  Admission Status Involuntary  Psychosocial Assessment  Patient Complaints None  Eye Contact Fair  Facial Expression Animated  Affect Appropriate to circumstance  Speech Logical/coherent  Interaction Minimal  Motor Activity Other (Comment) (wnl)  Appearance/Hygiene Unremarkable  Behavior Characteristics Cooperative;Appropriate to situation  Mood Pleasant  Thought Process  Coherency WDL  Content WDL  Delusions None reported or observed  Perception WDL  Hallucination None reported or observed  Judgment WDL  Confusion None  Danger to Self  Current suicidal ideation? Denies  Agreement Not to Harm Self Yes  Description of Agreement Verbal Contract  Danger to Others  Danger to Others None reported or observed

## 2023-07-19 NOTE — Plan of Care (Signed)
   Problem: Education: Goal: Verbalization of understanding the information provided will improve Outcome: Progressing   Problem: Activity: Goal: Interest or engagement in activities will improve Outcome: Progressing

## 2023-07-19 NOTE — Progress Notes (Signed)
 [facility] MD Progress Note  07/19/2023 5:49 PM Jay Woods  MRN:  [id number] Subjective:   Jay Woods is a 23 yr old male who presented on 5/17 to [facility] via law enforcement under IVC for erratic aggressive behavior towards his family, he was admitted to [facility] on 5/18. PPHx is significant for Schizoaffective Disorder, GAD, IED, Polysubstance Abuse (Benzo, Meth, THC), and 4 Prior Psychiatric Hospitalizations (last- [name] 07/2022), and no history of Suicide Attempts or Self Injurious Behavior.    Case was discussed in the multidisciplinary team. MAR was reviewed and patient was compliant with medications.  He received PRN Hydroxyzine  and Trazodone  last night.    On interview today patient reports he slept good last night.  He reports his appetite is doing good.  He reports no SI, HI, or AVH.  He reports no Paranoia or Ideas of Reference.  He reports no issues with his medications.  Patient presents hyperverbal, tangential and laughing inappropriately. Patient tells me "I just want to have sex with by girlfriend all the time... I could be a star... I feel great." Patient is very polite and addresses me and staff with respect but has been intrusive at nursing station and repeatedly laughing inappropriately with elevated mood. Admits to engaging in reckless behaviors when he has manic episodes in the past including substance abuse.  Patient denies grogginess or daytime sedation.  Encouraged patient to continue providing his girlfriend updates where he plans to stay.  Pt denied SI, HI, and AVH. Pt was able to endorse that THC negatively contributed to his presentation and that he should not use it again.    Principal Problem: Schizoaffective disorder, bipolar type (HCC) Diagnosis: Principal Problem:   Schizoaffective disorder, bipolar type (HCC)  Total Time spent with patient:  I personally spent 25 minutes on the unit in direct patient care. The direct patient care time included  face-to-face time with the patient, reviewing the patient's chart, communicating with other professionals, and coordinating care. Greater than 50% of this time was spent in counseling or coordinating care with the patient regarding goals of hospitalization, psycho-education, and discharge planning needs.   Past Psychiatric History:  Schizoaffective Disorder, GAD, IED, Polysubstance Abuse (Benzo, Meth, THC), and 4 Prior Psychiatric Hospitalizations (last- [name]/2022), and no history of Suicide Attempts or Self Injurious Behavior.   Past Medical History:  Past Medical History:  Diagnosis Date   Eczema    Schizoaffective disorder (HCC)    History reviewed. No pertinent surgical history. Family History:  Family History  Problem Relation Age of Onset   Healthy Mother    Healthy Father    Family Psychiatric  History:  He reports possible family diagnosis' Reports No Known Suicides or Substance Abuse   Social History:  Social History   Substance and Sexual Activity  Alcohol Use Not Currently   Alcohol/week: 1.0 standard drink of alcohol   Types: 1 Cans of beer per week   Comment: last use 09/30/21     Social History   Substance and Sexual Activity  Drug Use Not Currently   Comment: Hx of cocaine, marijuana, k2, Xanax, and Molly on and off for the past 5 years    Social History   Socioeconomic History   Marital status: Single    Spouse name: Not on file   Number of children: Not on file   Years of education: Not on file   Highest education level: Not on file  Occupational History   Not on file  Tobacco Use   Smoking status: Former    Current packs/day: 0.25    Average packs/day: 0.3 packs/day for 5.0 years (1.3 ttl pk-yrs)    Types: Cigarettes   Smokeless tobacco: Former  Building services engineer status: Every Day   Substances: CBD  Substance and Sexual Activity   Alcohol use: Not Currently    Alcohol/week: 1.0 standard drink of alcohol    Types: 1 Cans of beer per  week    Comment: last use 09/30/21   Drug use: Not Currently    Comment: Hx of cocaine, marijuana, k2, Xanax, and Molly on and off for the past 5 years   Sexual activity: Yes    Partners: Female    Birth control/protection: None  Other Topics Concern   Not on file  Social History Narrative   Not on file   Social Drivers of Health   Financial Resource Strain: High Risk (10/06/2021)   Overall Financial Resource Strain (CARDIA)    Difficulty of Paying Living Expenses: Very hard  Food Insecurity: Food Insecurity Present (07/10/2023)   Hunger Vital Sign    Worried About Running Out of Food in the Last Year: Sometimes true    Ran Out of Food in the Last Year: Sometimes true  Transportation Needs: Unmet Transportation Needs (07/10/2023)   PRAPARE - Transportation    Lack of Transportation (Medical): Yes    Lack of Transportation (Non-Medical): Yes  Physical Activity: Sufficiently Active (10/06/2021)   Exercise Vital Sign    Days of Exercise per Week: 7 days    Minutes of Exercise per Session: 30 min  Stress: No Stress Concern Present (10/06/2021)   [employer] of Occupational Health - Occupational Stress Questionnaire    Feeling of Stress : Only a little  Social Connections: Socially Integrated (10/06/2021)   Social Connection and Isolation Panel [NHANES]    Frequency of Communication with Friends and Family: More than three times a week    Frequency of Social Gatherings with Friends and Family: More than three times a week    Attends Religious Services: More than 4 times per year    Active Member of [employer] or Organizations: No    Attends Engineer, structural: More than 4 times per year    Marital Status: Living with partner   Additional Social History:                         Sleep: Good  Appetite:  Good  Current Medications: Current Facility-Administered Medications  Medication Dose Route Frequency Provider Last Rate Last Admin   acetaminophen  (TYLENOL )  tablet 650 mg  650 mg Oral Q6H PRN Motley-Mangrum, Jadeka A, PMHNP   650 mg at 07/11/23 0030   alum & mag hydroxide-simeth (MAALOX/MYLANTA) 200-200-20 MG/5ML suspension 30 mL  30 mL Oral Q4H PRN Motley-Mangrum, Jadeka A, PMHNP       haloperidol  (HALDOL ) tablet 5 mg  5 mg Oral TID PRN Motley-Mangrum, Jadeka A, PMHNP   5 mg at 07/11/23 1610   And   diphenhydrAMINE  (BENADRYL ) capsule 50 mg  50 mg Oral TID PRN Motley-Mangrum, Jadeka A, PMHNP   50 mg at 07/11/23 9604   haloperidol  lactate (HALDOL ) injection 5 mg  5 mg Intramuscular TID PRN Motley-Mangrum, Jadeka A, PMHNP       And   diphenhydrAMINE  (BENADRYL ) injection 50 mg  50 mg Intramuscular TID PRN Motley-Mangrum, Jadeka A, PMHNP       And  LORazepam  (ATIVAN ) injection 2 mg  2 mg Intramuscular TID PRN Motley-Mangrum, Jadeka A, PMHNP       haloperidol  lactate (HALDOL ) injection 10 mg  10 mg Intramuscular TID PRN Motley-Mangrum, Jadeka A, PMHNP       And   diphenhydrAMINE  (BENADRYL ) injection 50 mg  50 mg Intramuscular TID PRN Motley-Mangrum, Jadeka A, PMHNP       And   LORazepam  (ATIVAN ) injection 2 mg  2 mg Intramuscular TID PRN Motley-Mangrum, Jadeka A, PMHNP       divalproex  (DEPAKOTE  ER) 24 hr tablet 1,500 mg  1,500 mg Oral Q2000 McQuilla, Jai B, MD   1,500 mg at 07/18/23 2144   feeding supplement (ENSURE ENLIVE / ENSURE PLUS) liquid 237 mL  237 mL Oral BID BM Attiah, Nadir, MD   237 mL at 07/19/23 1328   hydrOXYzine  (ATARAX ) tablet 25 mg  25 mg Oral TID PRN Motley-Mangrum, Jadeka A, PMHNP   25 mg at 07/15/23 2102   magnesium  hydroxide (MILK OF MAGNESIA) suspension 30 mL  30 mL Oral Daily PRN Motley-Mangrum, Jadeka A, PMHNP       nicotine  polacrilex (NICORETTE ) gum 2 mg  2 mg Oral Q4H while awake Motley-Mangrum, Jadeka A, PMHNP   2 mg at 07/18/23 2144   risperiDONE  (RISPERDAL  M-TABS) disintegrating tablet 3 mg  3 mg Oral QHS Zykera Abella, MD   3 mg at 07/18/23 2144   [START ON 07/20/2023] risperiDONE  (RISPERDAL  M-TABS) disintegrating tablet  3 mg  3 mg Oral Daily Jd Mccaster, MD       traZODone  (DESYREL ) tablet 50 mg  50 mg Oral QHS PRN Ajibola, Ene A, NP   50 mg at 07/17/23 2101    Lab Results:  No results found for this or any previous visit (from the past 48 hours).   Blood Alcohol level:  Lab Results  Component Value Date   [facility] <15 07/10/2023   ETH <10 09/24/2021    Metabolic Disorder Labs: Lab Results  Component Value Date   HGBA1C 5.0 07/12/2023   MPG 96.8 07/12/2023   MPG 108.28 09/24/2021   Lab Results  Component Value Date   PROLACTIN 4.4 06/09/2020   Lab Results  Component Value Date   CHOL 126 07/12/2023   TRIG 48 07/12/2023   HDL 44 07/12/2023   CHOLHDL 2.9 07/12/2023   VLDL 10 07/12/2023   LDLCALC 72 07/12/2023   LDLCALC 81 09/24/2021    Physical Findings: AIMS:  , ,  ,  ,    CIWA:    COWS:     Musculoskeletal: Strength & Muscle Tone: within normal limits Gait & Station: normal Patient leans: N/A  Psychiatric Specialty Exam:  Presentation  General Appearance:  Appropriate for Environment; Casual  Eye Contact: Good  Speech: Clear but hyperverbal and tangential  Speech Volume: Normal  Handedness: Ambidextrous   Mood and Affect  Mood: Elevated, "Great!"  Affect: congruent   Thought Process  Thought Processes: tangential  Descriptions of Associations:Intact  Orientation:Full (Time, Place and Person)  Thought Content:grandiose  History of Schizophrenia/Schizoaffective disorder:No data recorded Duration of Psychotic Symptoms:No data recorded Hallucinations:Hallucinations: None  Ideas of Reference:None  Suicidal Thoughts:Suicidal Thoughts: No  Homicidal Thoughts:Homicidal Thoughts: No   Sensorium  Memory: Immediate Good; Recent Good  Judgment: Fair  Insight: Fair   Chartered certified accountant: Fair  Attention Span: Fair  Recall: [employer] of Knowledge: Fair  Language: Fair   Psychomotor Activity  Psychomotor  Activity: Psychomotor Activity: Normal   Assets  Assets: Desire for Improvement; Resilience   Sleep  Sleep: Sleep: Fair Number of Hours of Sleep: 8.25    Physical Exam: Physical Exam Vitals and nursing note reviewed.  Constitutional:      General: He is not in acute distress.    Appearance: Normal appearance. He is normal weight. He is not ill-appearing or toxic-appearing.  HENT:     Head: Normocephalic and atraumatic.  Pulmonary:     Effort: Pulmonary effort is normal.  Musculoskeletal:        General: Normal range of motion.  Neurological:     General: No focal deficit present.     Mental Status: He is alert.    Review of Systems  Respiratory:  Negative for cough and shortness of breath.   Cardiovascular:  Negative for chest pain.  Gastrointestinal:  Negative for abdominal pain, constipation, diarrhea, nausea and vomiting.  Neurological:  Negative for dizziness, weakness and headaches.  Psychiatric/Behavioral:  Negative for depression, hallucinations and suicidal ideas. The patient is not nervous/anxious.    Blood pressure (!) 142/79, pulse 88, temperature (!) 97.4 F (36.3 C), resp. rate 16, height 5\' 9"  (1.753 m), weight 63 kg, SpO2 98%. Body mass index is 20.53 kg/m.   Treatment Plan Summary: Daily contact with patient to assess and evaluate symptoms and progress in treatment and Medication management  Jay Woods is a 23 yr old male who presented on 5/17 to [facility] via law enforcement under IVC for erratic aggressive behavior towards his family, he was admitted to [facility] on 5/18.  PPHx is significant for Schizoaffective Disorder, GAD, IED, Polysubstance Abuse (Benzo, Meth, THC), and 4 Prior Psychiatric Hospitalizations (last- [name]/2022), and no history of Suicide Attempts or Self Injurious Behavior.     5/26: patient grandiose, hyperverbal, laughs inappropriately and also hypersexual today, although redirectable and has not been agitated,  improving sleep. AT this time patient appears more hypomanic than manic although after increase of Risperdal  to 3 mg BID today and increase of Depakote  to 1500 his medications are maxed out. Patient has previously been stabilized  these doses on 2 prior discharges although in the future I would consider if Lithium may be a more effective mood stabilizer for patient. Denies SI or HI and has not been agitated. Girlfriend would like patient back to normal self prior to discharge as they are staying with her family.    Schizoaffective Disorder, Bipolar Type, current episode Manic  IED: -increase Risperdal  to 3 mg BID for mood stability and psychosis -Continue  Depakote  ER 1500 mg daily for mood stability -- Depakote  lvl and CMP 5/27 -Continue Agitation Protocol: Haldol /Ativan /Benadryl      Nicotine  Dependence: -Continue Nicotine  Gum 2 mg q4 PRN     -Continue Ensure 237 mL BID -Continue PRN's: Tylenol , Maalox, Atarax , Milk of Magnesia   --  The risks/benefits/side-effects/alternatives to medications were discussed in detail with the patient and time was given for questions. The patient consents to medication trials.                -- Metabolic profile and EKG monitoring obtained while on an atypical antipsychotic (BMI: Lipid Panel: HbgA1c: QTc:)              -- Encouraged patient to participate in unit milieu and in scheduled group therapies              -- Short Term Goals: Ability to identify changes in lifestyle to reduce recurrence of condition will improve, Ability to verbalize feelings  will improve, Ability to disclose and discuss suicidal ideas, Ability to demonstrate self-control will improve, Ability to identify and develop effective coping behaviors will improve, Ability to maintain clinical measurements within normal limits will improve, Compliance with prescribed medications will improve, and Ability to identify triggers associated with substance abuse/mental health issues will improve              -- Long Term Goals: Improvement in symptoms so as ready for discharge   Safety and Monitoring:             -- Involuntary admission to inpatient psychiatric unit for safety, stabilization and treatment             -- Daily contact with patient to assess and evaluate symptoms and progress in treatment             -- Patient's case to be discussed in multi-disciplinary team meeting             -- Observation Level : q15 minute checks             -- Vital signs:  q12 hours             -- Precautions: suicide, elopement, and assault  Discharge Planning:              -- Social work and case management to assist with discharge planning and identification of hospital follow-up needs prior to discharge             -- Estimated LOS: 2-3 more days, anticipated discharge Thursday or Friday             -- Discharge Concerns: Need to establish a safety plan; Medication compliance and effectiveness             -- Discharge Goals: Return home with outpatient referrals for mental health follow-up including medication management/psychotherapy   Darrell Leonhardt, MD 07/19/2023, 5:49 PM

## 2023-07-19 NOTE — [facility] Group Notes (Signed)
 [facility] Group Notes:  (Nursing/MHT/Case Management/Adjunct)  Date:  07/19/2023  Time:  9:17 PM  Type of Therapy:  Wrap-up group  Participation Level:  Active  Participation Quality:  Appropriate  Affect:  Appropriate  Cognitive:  Appropriate  Insight:  Appropriate  Engagement in Group:  Engaged  Modes of Intervention:  Education  Summary of Progress/Problems:Goal to work on D/C. Rated his day 6/10.   Jay Woods 07/19/2023, 9:17 PM

## 2023-07-19 NOTE — Group Note (Signed)
 Recreation Therapy Group Note   Group Topic:Leisure Education  Group Date: 07/19/2023 Start Time: 0932 End Time: 1018 Facilitators: Zyshawn Bohnenkamp-McCall, LRT,CTRS Location: [address] Dayroom   Group Topic: Leisure Education   Goal Area(s) Addresses:  Patient will successfully identify positive leisure and recreation activities.  Patient will acknowledge benefits of participation in healthy leisure activities post discharge.  Patient will actively work with peers toward a shared goal.   Behavioral Response: Observant   Intervention: Competitive Group Game   Activity: Guess the [employer]. The game was broken up into 6 categories (Pop, [place], [place], Hip-Hop, R&B and Dance). The first team would use the spinner to land one of the music categories. Which ever category the spinner landed on, that group would read the lyrics and opposing team had to fill in the blank. If they answered correctly, they kept the card. The team with the most cards at the end, wins the game. Post-activity discussion reviewed benefits of positive recreation outlets: reducing stress, improving coping mechanisms, increasing self-esteem, and building larger support systems.   Education:  Teacher, English as a foreign language, Stress Management, Discharge Planning  Education Outcome: Acknowledges education/In group clarification offered/Needs additional education   Affect/Mood: Appropriate   Participation Level: None   Participation Quality: None   Behavior: On-looking   Speech/Thought Process: Distracted   Insight: None   Judgement: None   Modes of Intervention: Competitive Play   Patient Response to Interventions:  Disengaged   Education Outcome:  In group clarification offered    Clinical Observations/Individualized Feedback: Pt came the last few rounds. Pt was focused on whether anyone knew who Adela Ades was. Pt was able to redirected. Pt didn't participate while in group.     Plan: Continue to engage patient  in RT group sessions 2-3x/week.   Shakil Dirk-McCall, LRT,CTRS 07/19/2023 1:51 PM

## 2023-07-19 NOTE — Plan of Care (Signed)
 Pt A/O x4 with noted Schizoaffective/Bipolar. Pt able to verbalize needs. Meds whole. Snacks/fluids offered. ADL's self. Ambulates with a steady gait. Pt's mood is calm/pleasant. Pt denies SI/HI; A/V/H.Pt in bed eyes closed with equal rise and fall of chest. No noted distress. Continuing of care during 7p-7a shift.

## 2023-07-19 NOTE — Progress Notes (Signed)
   07/18/23 2025  Psych Admission Type (Psych Patients Only)  Admission Status Involuntary  Psychosocial Assessment  Patient Complaints None  Eye Contact Fair;Other (Comment) (WNL)  Facial Expression Other (Comment) (WNL)  Affect Appropriate to circumstance  Speech Logical/coherent  Interaction Isolative  Motor Activity Other (Comment) (WNL)  Appearance/Hygiene Other (Comment) (WNL)  Behavior Characteristics Cooperative;Appropriate to situation;Calm  Mood Pleasant  Thought Process  Coherency WDL  Content WDL  Delusions None reported or observed  Perception WDL  Hallucination None reported or observed  Judgment WDL  Confusion None  Danger to Self  Current suicidal ideation? Denies (Denies)  Agreement Not to Harm Self Yes  Description of Agreement Notify Staff  Danger to Others  Danger to Others None reported or observed

## 2023-07-20 LAB — COMPREHENSIVE METABOLIC PANEL WITH GFR
ALT: 20 U/L (ref 0–44)
AST: 19 U/L (ref 15–41)
Albumin: 4 g/dL (ref 3.5–5.0)
Alkaline Phosphatase: 42 U/L (ref 38–126)
Anion gap: 8 (ref 5–15)
BUN: 11 mg/dL (ref 6–20)
CO2: 22 mmol/L (ref 22–32)
Calcium: 8.7 mg/dL — ABNORMAL LOW (ref 8.9–10.3)
Chloride: 102 mmol/L (ref 98–111)
Creatinine, Ser: 0.64 mg/dL (ref 0.61–1.24)
GFR, Estimated: >60 mL/min (ref >60)
Glucose, Bld: 89 mg/dL (ref 70–99)
Potassium: 4 mmol/L (ref 3.5–5.1)
Sodium: 132 mmol/L — ABNORMAL LOW (ref 135–145)
Total Bilirubin: 0.4 mg/dL (ref 0.0–1.2)
Total Protein: 7.6 g/dL (ref 6.5–8.1)

## 2023-07-20 LAB — VALPROIC ACID LEVEL: Valproic Acid Lvl: 56 ug/mL (ref 50–100)

## 2023-07-20 MED ORDER — DIVALPROEX SODIUM ER 500 MG PO TB24
2000.0000 mg | ORAL_TABLET | Freq: Every day | ORAL | Status: DC
  Administered 2023-07-20 – 2023-07-24 (×5): 2000 mg via ORAL
  Filled 2023-07-20 (×5): qty 4

## 2023-07-20 NOTE — Plan of Care (Signed)
   Problem: Education: Goal: Emotional status will improve Outcome: Progressing Goal: Mental status will improve Outcome: Progressing

## 2023-07-20 NOTE — Group Note (Signed)
 LCSW Group Therapy Note   Group Date: 07/20/2023 Start Time: 1100 End Time: 1200  Participation:  did not attend  Type of Therapy:  Group Therapy  Topic:  "Money Matters: Ecologist, Confidence and Peace of Mind"  Objective: To help participants understand the impact of financial stability on well-being through the lens of Maslow's Hierarchy of Needs and develop practical strategies for budgeting, saving, and debt repayment.  Goals: Increase awareness of spending habits and financial priorities, recognizing how money supports basic needs, security, and relationships. Develop simple budgeting and saving strategies to enhance stability and peace of mind.  Reduce financial stress by creating a realistic debt repayment plan, supporting long-term confidence and well-being.  Summary:  Participants explored how financial stability connects to basic needs, relationships, and self-esteem using Maslow's Hierarchy. They discussed budgeting, saving, and debt repayment strategies, identifying small, manageable changes. Through interactive discussion and self-reflection, they gained insight into their financial habits and created personal action steps for improvement.  Therapeutic Modalities Used: Elements of Cognitive Behavioral Therapy (CBT) - Addressing financial stress and thought patterns. Psychoeducation - Engineer, agricultural. Elements of Motivational Interviewing (MI) - Encouraging realistic, achievable changes. Group Support - Reducing shame and stress through shared experiences.   Temara Lanum O Britain Anagnos, LCSWA 07/20/2023  4:37 PM

## 2023-07-20 NOTE — Progress Notes (Signed)
 Jay Woods did not attend wrap up group

## 2023-07-20 NOTE — Group Note (Signed)
 Date:  07/20/2023 Time:  9:17 AM  Group Topic/Focus:  Goals Group:   The focus of this group is to help patients establish daily goals to achieve during treatment and discuss how the patient can incorporate goal setting into their daily lives to aide in recovery.    Participation Level:  Did Not Attend

## 2023-07-20 NOTE — Progress Notes (Signed)
   07/20/23 2000  Psych Admission Type (Psych Patients Only)  Admission Status Involuntary  Psychosocial Assessment  Patient Complaints None  Eye Contact Fair  Facial Expression Animated  Affect Appropriate to circumstance  Speech Logical/coherent  Interaction Minimal  Motor Activity Other (Comment) (appropriate)  Appearance/Hygiene Unremarkable  Behavior Characteristics Cooperative  Mood Pleasant  Thought Process  Coherency WDL  Content WDL  Delusions Paranoid  Perception Hallucinations  Hallucination Auditory  Judgment Limited  Confusion None  Danger to Self  Current suicidal ideation? Denies  Agreement Not to Harm Self Yes  Description of Agreement verbal  Danger to Others  Danger to Others None reported or observed

## 2023-07-20 NOTE — Plan of Care (Signed)

## 2023-07-20 NOTE — Progress Notes (Signed)
 [facility] MD Progress Note  07/20/2023 12:25 PM [name]  MRN:  [id number] Subjective:   Jay Woods is a 23 yr old male who presented on 5/17 to [facility] via law enforcement under IVC for erratic aggressive behavior towards his family, he was admitted to [facility] on 5/18. PPHx is significant for Schizoaffective Disorder, GAD, IED, Polysubstance Abuse (Benzo, Meth, THC), and 4 Prior Psychiatric Hospitalizations (last- [name] 07/2022), and no history of Suicide Attempts or Self Injurious Behavior.    Case was discussed in the multidisciplinary team. MAR was reviewed and patient was compliant with medications.  He received as needed trazodone  almost nightly, received hydroxyzine  with last use 5/22    On interview today patient describes mood as "good" "healthy and normal" reports good sleep, denies feeling hyper or racing thoughts denies SI HI or AVH continues to plan to go to stay with girlfriend after discharge calling her over the phone on daily basis.  He denies side effect of medications and agrees to comply with p.o. medication and refusing LAI.  Staff report patient to be occasionally responding to stimuli in his room talking to himself. Patient presented primarily stable but as the interview goes on he presents easily going on a tangent with some racing thoughts and increased speech.   Principal Problem: Schizoaffective disorder, bipolar type (HCC) Diagnosis: Principal Problem:   Schizoaffective disorder, bipolar type (HCC)  Total Time spent with patient:  I personally spent 25 minutes on the unit in direct patient care. The direct patient care time included face-to-face time with the patient, reviewing the patient's chart, communicating with other professionals, and coordinating care. Greater than 50% of this time was spent in counseling or coordinating care with the patient regarding goals of hospitalization, psycho-education, and discharge planning needs.   Past  Psychiatric History:  Schizoaffective Disorder, GAD, IED, Polysubstance Abuse (Benzo, Meth, THC), and 4 Prior Psychiatric Hospitalizations (last- [name]/2022), and no history of Suicide Attempts or Self Injurious Behavior.   Past Medical History:  Past Medical History:  Diagnosis Date   Eczema    Schizoaffective disorder (HCC)    History reviewed. No pertinent surgical history. Family History:  Family History  Problem Relation Age of Onset   Healthy Mother    Healthy Father    Family Psychiatric  History:  He reports possible family diagnosis' Reports No Known Suicides or Substance Abuse   Social History:  Social History   Substance and Sexual Activity  Alcohol Use Not Currently   Alcohol/week: 1.0 standard drink of alcohol   Types: 1 Cans of beer per week   Comment: last use 09/30/21     Social History   Substance and Sexual Activity  Drug Use Not Currently   Comment: Hx of cocaine, marijuana, k2, Xanax, and Molly on and off for the past 5 years    Social History   Socioeconomic History   Marital status: Single    Spouse name: Not on file   Number of children: Not on file   Years of education: Not on file   Highest education level: Not on file  Occupational History   Not on file  Tobacco Use   Smoking status: Former    Current packs/day: 0.25    Average packs/day: 0.3 packs/day for 5.0 years (1.3 ttl pk-yrs)    Types: Cigarettes   Smokeless tobacco: Former  Building services engineer status: Every Day   Substances: CBD  Substance and Sexual Activity   Alcohol use: Not  Currently    Alcohol/week: 1.0 standard drink of alcohol    Types: 1 Cans of beer per week    Comment: last use 09/30/21   Drug use: Not Currently    Comment: Hx of cocaine, marijuana, k2, Xanax, and Molly on and off for the past 5 years   Sexual activity: Yes    Partners: Female    Birth control/protection: None  Other Topics Concern   Not on file  Social History Narrative   Not on file    Social Drivers of Health   Financial Resource Strain: High Risk (10/06/2021)   Overall Financial Resource Strain (CARDIA)    Difficulty of Paying Living Expenses: Very hard  Food Insecurity: Food Insecurity Present (07/10/2023)   Hunger Vital Sign    Worried About Running Out of Food in the Last Year: Sometimes true    Ran Out of Food in the Last Year: Sometimes true  Transportation Needs: Unmet Transportation Needs (07/10/2023)   PRAPARE - Transportation    Lack of Transportation (Medical): Yes    Lack of Transportation (Non-Medical): Yes  Physical Activity: Sufficiently Active (10/06/2021)   Exercise Vital Sign    Days of Exercise per Week: 7 days    Minutes of Exercise per Session: 30 min  Stress: No Stress Concern Present (10/06/2021)   [employer] of Occupational Health - Occupational Stress Questionnaire    Feeling of Stress : Only a little  Social Connections: Socially Integrated (10/06/2021)   Social Connection and Isolation Panel [NHANES]    Frequency of Communication with Friends and Family: More than three times a week    Frequency of Social Gatherings with Friends and Family: More than three times a week    Attends Religious Services: More than 4 times per year    Active Member of [employer] or Organizations: No    Attends Engineer, structural: More than 4 times per year    Marital Status: Living with partner   Additional Social History:                         Sleep: Good  Appetite:  Good  Current Medications: Current Facility-Administered Medications  Medication Dose Route Frequency Provider Last Rate Last Admin   acetaminophen  (TYLENOL ) tablet 650 mg  650 mg Oral Q6H PRN Motley-Mangrum, Jadeka A, PMHNP   650 mg at 07/20/23 1104   alum & mag hydroxide-simeth (MAALOX/MYLANTA) 200-200-20 MG/5ML suspension 30 mL  30 mL Oral Q4H PRN Motley-Mangrum, Jadeka A, PMHNP       haloperidol  (HALDOL ) tablet 5 mg  5 mg Oral TID PRN Motley-Mangrum, Jadeka A,  PMHNP   5 mg at 07/11/23 3086   And   diphenhydrAMINE  (BENADRYL ) capsule 50 mg  50 mg Oral TID PRN Motley-Mangrum, Jadeka A, PMHNP   50 mg at 07/11/23 5784   haloperidol  lactate (HALDOL ) injection 5 mg  5 mg Intramuscular TID PRN Motley-Mangrum, Jadeka A, PMHNP       And   diphenhydrAMINE  (BENADRYL ) injection 50 mg  50 mg Intramuscular TID PRN Motley-Mangrum, Jadeka A, PMHNP       And   LORazepam  (ATIVAN ) injection 2 mg  2 mg Intramuscular TID PRN Motley-Mangrum, Jadeka A, PMHNP       haloperidol  lactate (HALDOL ) injection 10 mg  10 mg Intramuscular TID PRN Motley-Mangrum, Jadeka A, PMHNP       And   diphenhydrAMINE  (BENADRYL ) injection 50 mg  50 mg Intramuscular TID PRN Motley-Mangrum, Jadeka  A, PMHNP       And   LORazepam  (ATIVAN ) injection 2 mg  2 mg Intramuscular TID PRN Motley-Mangrum, Jadeka A, PMHNP       divalproex  (DEPAKOTE  ER) 24 hr tablet 1,500 mg  1,500 mg Oral Q2000 McQuilla, Jai B, MD   1,500 mg at 07/19/23 2100   feeding supplement (ENSURE ENLIVE / ENSURE PLUS) liquid 237 mL  237 mL Oral BID BM Tekeisha Hakim, MD   237 mL at 07/20/23 1027   hydrOXYzine  (ATARAX ) tablet 25 mg  25 mg Oral TID PRN Motley-Mangrum, Jadeka A, PMHNP   25 mg at 07/15/23 2102   magnesium  hydroxide (MILK OF MAGNESIA) suspension 30 mL  30 mL Oral Daily PRN Motley-Mangrum, Jadeka A, PMHNP       nicotine  polacrilex (NICORETTE ) gum 2 mg  2 mg Oral Q4H while awake Motley-Mangrum, Jadeka A, PMHNP   2 mg at 07/19/23 2121   risperiDONE  (RISPERDAL  M-TABS) disintegrating tablet 3 mg  3 mg Oral QHS Zouev, Dmitri, MD   3 mg at 07/19/23 2121   risperiDONE  (RISPERDAL  M-TABS) disintegrating tablet 3 mg  3 mg Oral Daily Zouev, Dmitri, MD   3 mg at 07/20/23 0842   traZODone  (DESYREL ) tablet 50 mg  50 mg Oral QHS PRN Ajibola, Ene A, NP   50 mg at 07/19/23 2121    Lab Results:  Results for orders placed or performed during the hospital encounter of 07/10/23 (from the past 48 hours)  Valproic acid  level     Status: None    Collection Time: 07/20/23  6:21 AM  Result Value Ref Range   Valproic Acid  Lvl 56 50 - 100 ug/mL    Comment: Performed at [facility], [address], [place], [place] [address]  Comprehensive metabolic panel     Status: Abnormal   Collection Time: 07/20/23  6:21 AM  Result Value Ref Range   Sodium 132 (L) 135 - 145 mmol/L   Potassium 4.0 3.5 - 5.1 mmol/L   Chloride 102 98 - 111 mmol/L   CO2 22 22 - 32 mmol/L   Glucose, Bld 89 70 - 99 mg/dL    Comment: Glucose reference range applies only to samples taken after fasting for at least 8 hours.   BUN 11 6 - 20 mg/dL   Creatinine, Ser 6.04 0.61 - 1.24 mg/dL   Calcium 8.7 (L) 8.9 - 10.3 mg/dL   Total Protein 7.6 6.5 - 8.1 g/dL   Albumin 4.0 3.5 - 5.0 g/dL   AST 19 15 - 41 U/L   ALT 20 0 - 44 U/L   Alkaline Phosphatase 42 38 - 126 U/L   Total Bilirubin 0.4 0.0 - 1.2 mg/dL   GFR, Estimated >54 >09 mL/min    Comment: (NOTE) Calculated using the CKD-EPI Creatinine Equation (2021)    Anion gap 8 5 - 15    Comment: Performed at [facility], [address], [place], [place] [address]     Blood Alcohol level:  Lab Results  Component Value Date   [facility] <15 07/10/2023   ETH <10 09/24/2021    Metabolic Disorder Labs: Lab Results  Component Value Date   HGBA1C 5.0 07/12/2023   MPG 96.8 07/12/2023   MPG 108.28 09/24/2021   Lab Results  Component Value Date   PROLACTIN 4.4 06/09/2020   Lab Results  Component Value Date   CHOL 126 07/12/2023   TRIG 48 07/12/2023   HDL 44 07/12/2023   CHOLHDL  2.9 07/12/2023   VLDL 10 07/12/2023   LDLCALC 72 07/12/2023   LDLCALC 81 09/24/2021    Physical Findings: AIMS:  , ,  ,  ,    CIWA:    COWS:     Musculoskeletal: Strength & Muscle Tone: within normal limits Gait & Station: normal Patient leans: N/A  Psychiatric Specialty Exam:  Presentation  General Appearance:  Appropriate for Environment; Casual  Eye Contact: Good  Speech: Clear  but hyperverbal and tangential  Speech Volume: Normal  Handedness: Ambidextrous   Mood and Affect  Mood: Elevated, "Great!"  Affect: congruent   Thought Process  Thought Processes: tangential  Descriptions of Associations:Intact  Orientation:Full (Time, Place and Person)  Thought Content:grandiose  History of Schizophrenia/Schizoaffective disorder:No data recorded Duration of Psychotic Symptoms:No data recorded Hallucinations:No data recorded  Ideas of Reference:None  Suicidal Thoughts:No data recorded  Homicidal Thoughts:No data recorded   Sensorium  Memory: Immediate Good; Recent Good  Judgment: Fair  Insight: Fair   Chartered certified accountant: Fair  Attention Span: Fair  Recall: [employer] of Knowledge: Fair  Language: Fair   Psychomotor Activity  Psychomotor Activity: No data recorded   Assets  Assets: Desire for Improvement; Resilience   Sleep  Sleep: No data recorded    Physical Exam: Physical Exam Vitals and nursing note reviewed.  Constitutional:      General: He is not in acute distress.    Appearance: Normal appearance. He is normal weight. He is not ill-appearing or toxic-appearing.  HENT:     Head: Normocephalic and atraumatic.  Pulmonary:     Effort: Pulmonary effort is normal.  Musculoskeletal:        General: Normal range of motion.  Neurological:     General: No focal deficit present.     Mental Status: He is alert.    Review of Systems  Respiratory:  Negative for cough and shortness of breath.   Cardiovascular:  Negative for chest pain.  Gastrointestinal:  Negative for abdominal pain, constipation, diarrhea, nausea and vomiting.  Neurological:  Negative for dizziness, weakness and headaches.  Psychiatric/Behavioral:  Negative for depression, hallucinations and suicidal ideas. The patient is not nervous/anxious.    Blood pressure (!) 145/76, pulse 80, temperature 98 F (36.7 C),  temperature source Oral, resp. rate 18, height 5\' 9"  (1.753 m), weight 63 kg, SpO2 98%. Body mass index is 20.53 kg/m.   Treatment Plan Summary: Daily contact with patient to assess and evaluate symptoms and progress in treatment and Medication management  Burdell Woods is a 23 yr old male who presented on 5/17 to [facility] via law enforcement under IVC for erratic aggressive behavior towards his family, he was admitted to [facility] on 5/18.  PPHx is significant for Schizoaffective Disorder, GAD, IED, Polysubstance Abuse (Benzo, Meth, THC), and 4 Prior Psychiatric Hospitalizations (last- [name]/2022), and no history of Suicide Attempts or Self Injurious Behavior.     5/26: patient grandiose, hyperverbal, laughs inappropriately and also hypersexual today, although redirectable and has not been agitated, improving sleep. AT this time patient appears more hypomanic than manic although after increase of Risperdal  to 3 mg BID today and increase of Depakote  to 1500 his medications are maxed out. Patient has previously been stabilized  these doses on 2 prior discharges although in the future I would consider if Lithium may be a more effective mood stabilizer for patient. Denies SI or HI and has not been agitated. Girlfriend would like patient back to normal self prior  to discharge as they are staying with her family.    Schizoaffective Disorder, Bipolar Type, current episode Manic  IED: - On 5/26, increased Risperdal  to 3 mg BID for mood stability and psychosis - 5/27, titrate Depakote  ER from 1500-2000 mg daily for mood stability -- 5/27 CMP within normal level except for low sodium 132, Depakote  level 56 still low therapeutic Recheck CMP 5/28, recheck Depakote  level 5/31 -Continue Agitation Protocol: Haldol /Ativan /Benadryl      Nicotine  Dependence: -Continue Nicotine  Gum 2 mg q4 PRN     -Continue Ensure 237 mL BID -Continue PRN's: Tylenol , Maalox, Atarax , Milk of Magnesia   --  The  risks/benefits/side-effects/alternatives to medications were discussed in detail with the patient and time was given for questions. The patient consents to medication trials.                -- Metabolic profile and EKG monitoring obtained while on an atypical antipsychotic (BMI: Lipid Panel: HbgA1c: QTc:)              -- Encouraged patient to participate in unit milieu and in scheduled group therapies              -- Short Term Goals: Ability to identify changes in lifestyle to reduce recurrence of condition will improve, Ability to verbalize feelings will improve, Ability to disclose and discuss suicidal ideas, Ability to demonstrate self-control will improve, Ability to identify and develop effective coping behaviors will improve, Ability to maintain clinical measurements within normal limits will improve, Compliance with prescribed medications will improve, and Ability to identify triggers associated with substance abuse/mental health issues will improve             -- Long Term Goals: Improvement in symptoms so as ready for discharge   Safety and Monitoring:             -- Involuntary admission to inpatient psychiatric unit for safety, stabilization and treatment             -- Daily contact with patient to assess and evaluate symptoms and progress in treatment             -- Patient's case to be discussed in multi-disciplinary team meeting             -- Observation Level : q15 minute checks             -- Vital signs:  q12 hours             -- Precautions: suicide, elopement, and assault  Discharge Planning:              -- Social work and case management to assist with discharge planning and identification of hospital follow-up needs prior to discharge             -- Estimated LOS: 2-3 more days, anticipated discharge Thursday or Friday             -- Discharge Concerns: Need to establish a safety plan; Medication compliance and effectiveness             -- Discharge Goals: Return home  with outpatient referrals for mental health follow-up including medication management/psychotherapy   Kabeer Hoagland Linnie Riches, MD 07/20/2023, 12:25 PM

## 2023-07-20 NOTE — Progress Notes (Signed)
  Aldous Artica-Limones   Type of Note: IVC COURT HEARING   Pt participated in IVC virtual court hearing, judge granted [facility] additional 7 days.   Signed:  Lyriq Finerty, LCSW-A 07/20/2023  2:35 PM

## 2023-07-20 NOTE — Group Note (Signed)
 Recreation Therapy Group Note   Group Topic:Animal Assisted Therapy   Group Date: 07/20/2023 Start Time: 1308 End Time: 1030 Facilitators: Ling Flesch-McCall, LRT,CTRS Location: [address] Dayroom   Animal-Assisted Activity (AAA) Program Checklist/Progress Notes Patient Eligibility Criteria Checklist & Daily Group note for Rec Tx Intervention  AAA/T Program Assumption of Risk Form signed by Patient/ or Parent Legal Guardian Yes  Patient understands his/her participation is voluntary Yes  Behavioral Response:    Education: Charity fundraiser, Appropriate Animal Interaction   Education Outcome: Acknowledges education.    Affect/Mood: N/A   Participation Level: Did not attend    Clinical Observations/Individualized Feedback:     Plan: Continue to engage patient in RT group sessions 2-3x/week.   Arina Torry-McCall, LRT,CTRS 07/20/2023 11:53 AM

## 2023-07-20 NOTE — Progress Notes (Signed)
   07/20/23 1000  Psych Admission Type (Psych Patients Only)  Admission Status Involuntary  Psychosocial Assessment  Patient Complaints None  Eye Contact Fair  Facial Expression Animated  Affect Appropriate to circumstance  Speech Logical/coherent  Interaction Guarded  Motor Activity Other (Comment)  Appearance/Hygiene Unremarkable  Behavior Characteristics Cooperative  Mood Pleasant  Thought Process  Coherency WDL  Content WDL  Delusions Paranoid  Perception Hallucinations  Hallucination Auditory  Judgment Poor  Confusion None  Danger to Self  Current suicidal ideation? Denies  Danger to Others  Danger to Others None reported or observed   Dar Note: Patient presents with anxious affect and mood.  Medications given as prescribed.  Patient observed talking to self, smiling and responding to internal stimuli.  Routine safety checks maintained.  Patient is safe on and off the unit.

## 2023-07-21 ENCOUNTER — Encounter (HOSPITAL_COMMUNITY): Payer: Self-pay

## 2023-07-21 LAB — COMPREHENSIVE METABOLIC PANEL WITH GFR
ALT: 21 U/L (ref 0–44)
AST: 19 U/L (ref 15–41)
Albumin: 4.1 g/dL (ref 3.5–5.0)
Alkaline Phosphatase: 44 U/L (ref 38–126)
Anion gap: 11 (ref 5–15)
BUN: 11 mg/dL (ref 6–20)
CO2: 24 mmol/L (ref 22–32)
Calcium: 9.1 mg/dL (ref 8.9–10.3)
Chloride: 102 mmol/L (ref 98–111)
Creatinine, Ser: 0.73 mg/dL (ref 0.61–1.24)
GFR, Estimated: >60 mL/min (ref >60)
Glucose, Bld: 98 mg/dL (ref 70–99)
Potassium: 4.1 mmol/L (ref 3.5–5.1)
Sodium: 137 mmol/L (ref 135–145)
Total Bilirubin: 0.3 mg/dL (ref 0.0–1.2)
Total Protein: 8.1 g/dL (ref 6.5–8.1)

## 2023-07-21 NOTE — Plan of Care (Signed)
   Problem: Education: Goal: Knowledge of [facility] Education information/materials will improve Outcome: Progressing Goal: Emotional status will improve Outcome: Progressing Goal: Mental status will improve Outcome: Progressing

## 2023-07-21 NOTE — Progress Notes (Signed)
   07/21/23 0555  15 Minute Checks  Location Bedroom  Visual Appearance Calm  Behavior Sleeping  Sleep (Behavioral Health Patients Only)  Calculate sleep? (Click Yes once per 24 hr at 0600 safety check) Yes  Documented sleep last 24 hours 8

## 2023-07-21 NOTE — Progress Notes (Signed)
 [facility] MD Progress Note  07/21/2023 11:49 AM Jay Woods  MRN:  [id number] Subjective:   Jay Woods is a 23 yr old male who presented on 5/17 to [facility] via law enforcement under IVC for erratic aggressive behavior towards his family, he was admitted to [facility] on 5/18. PPHx is significant for Schizoaffective Disorder, GAD, IED, Polysubstance Abuse (Benzo, Meth, THC), and 4 Prior Psychiatric Hospitalizations (last- [name]/2022), and no history of Suicide Attempts or Self Injurious Behavior.    Case was discussed in the multidisciplinary team. MAR was reviewed and patient was compliant with medications.  He received as needed trazodone  almost nightly, received hydroxyzine  with last use 5/22, no as needed medication needed were given for agitation or aggression    On interview today patient continues to report feeling good in general he does deny any symptoms consistent with mania denying any feeling hyper or emotional any crying spells or racing thoughts or increased speech.  He denies depressed mood, denies SI HI or AVH.  He reports good sleep last night and was reported by staff to have slept 8 hours.  He reports fair appetite and energy level and denies feeling the hyper or increased energy.  He does report attending groups and participating with no negative reports from groups noted.  He was reported by staff to be occasionally talking to himself in the room but he notes this is his baseline.  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  His girlfriend have not visited and he did not call her for 1 to 2 days, I advised him to have multiple phone calls with her today  and I will call her tomorrow to assess patient's progress and improvement.   Principal Problem: Schizoaffective disorder, bipolar type (HCC) Diagnosis: Principal Problem:   Schizoaffective disorder, bipolar type (HCC)  Total Time spent with patient:  I personally spent 35 minutes on the unit in direct patient care. The direct patient care time included face-to-face time with the patient, reviewing the patient's chart, communicating with other professionals, and coordinating care. Greater than 50% of this time was spent in counseling or coordinating care with the patient regarding goals of hospitalization, psycho-education, and discharge planning needs.   Past Psychiatric History:  Schizoaffective Disorder, GAD, IED, Polysubstance Abuse (Benzo, Meth, THC), and 4 Prior Psychiatric Hospitalizations (last- [name]/2022), and no history of Suicide Attempts or Self Injurious Behavior.   Past Medical History:  Past Medical History:  Diagnosis Date   Eczema    Schizoaffective disorder (HCC)    History reviewed. No pertinent surgical history. Family History:  Family History  Problem Relation Age of Onset   Healthy Mother    Healthy Father    Family Psychiatric  History:  He reports possible family diagnosis' Reports No Known Suicides or Substance Abuse   Social History:  Social History   Substance and Sexual Activity  Alcohol Use Not Currently   Alcohol/week: 1.0 standard drink of alcohol   Types: 1 Cans of beer per week   Comment: last use 09/30/21     Social History   Substance and Sexual Activity  Drug Use Not  Currently   Comment: Hx of cocaine, marijuana, k2, Xanax, and Molly on and off for the past 5 years    Social History   Socioeconomic History   Marital status: Single    Spouse name: Not on file   Number of children: Not on file   Years of education: Not on file   Highest education level: Not on file  Occupational History   Not on file  Tobacco Use    Smoking status: Former    Current packs/day: 0.25    Average packs/day: 0.3 packs/day for 5.0 years (1.3 ttl pk-yrs)    Types: Cigarettes   Smokeless tobacco: Former  Building services engineer status: Every Day   Substances: CBD  Substance and Sexual Activity   Alcohol use: Not Currently    Alcohol/week: 1.0 standard drink of alcohol    Types: 1 Cans of beer per week    Comment: last use 09/30/21   Drug use: Not Currently    Comment: Hx of cocaine, marijuana, k2, Xanax, and Molly on and off for the past 5 years   Sexual activity: Yes    Partners: Female    Birth control/protection: None  Other Topics Concern   Not on file  Social History Narrative   Not on file   Social Drivers of Health   Financial Resource Strain: High Risk (10/06/2021)   Overall Financial Resource Strain (CARDIA)    Difficulty of Paying Living Expenses: Very hard  Food Insecurity: Food Insecurity Present (07/10/2023)   Hunger Vital Sign    Worried About Running Out of Food in the Last Year: Sometimes true    Ran Out of Food in the Last Year: Sometimes true  Transportation Needs: Unmet Transportation Needs (07/10/2023)   PRAPARE - Transportation    Lack of Transportation (Medical): Yes    Lack of Transportation (Non-Medical): Yes  Physical Activity: Sufficiently Active (10/06/2021)   Exercise Vital Sign    Days of Exercise per Week: 7 days    Minutes of Exercise per Session: 30 min  Stress: No Stress Concern Present (10/06/2021)   [employer] of Occupational Health - Occupational Stress Questionnaire    Feeling of Stress : Only a little  Social Connections: Socially Integrated (10/06/2021)   Social Connection and Isolation Panel [NHANES]    Frequency of Communication with Friends and Family: More than three times a week    Frequency of Social Gatherings with Friends and Family: More than three times a week    Attends Religious Services: More than 4 times per year    Active Member of [employer] or Organizations:  No    Attends Engineer, structural: More than 4 times per year    Marital Status: Living with partner   Additional Social History:                         Sleep: Good  Appetite:  Good  Current Medications: Current Facility-Administered Medications  Medication Dose Route Frequency Provider Last Rate Last Admin   acetaminophen  (TYLENOL ) tablet 650 mg  650 mg Oral Q6H PRN Motley-Mangrum, Jadeka A, PMHNP   650 mg at 07/20/23 1104   alum & mag hydroxide-simeth (MAALOX/MYLANTA) 200-200-20 MG/5ML suspension 30 mL  30 mL Oral Q4H PRN Motley-Mangrum, Jadeka A, PMHNP       haloperidol  (HALDOL ) tablet 5 mg  5 mg Oral TID PRN Motley-Mangrum, Jadeka A, PMHNP   5 mg at 07/11/23 8119   And  diphenhydrAMINE  (BENADRYL ) capsule 50 mg  50 mg Oral TID PRN Motley-Mangrum, Jadeka A, PMHNP   50 mg at 07/11/23 2725   haloperidol  lactate (HALDOL ) injection 5 mg  5 mg Intramuscular TID PRN Motley-Mangrum, Jadeka A, PMHNP       And   diphenhydrAMINE  (BENADRYL ) injection 50 mg  50 mg Intramuscular TID PRN Motley-Mangrum, Jadeka A, PMHNP       And   LORazepam  (ATIVAN ) injection 2 mg  2 mg Intramuscular TID PRN Motley-Mangrum, Jadeka A, PMHNP       haloperidol  lactate (HALDOL ) injection 10 mg  10 mg Intramuscular TID PRN Motley-Mangrum, Jadeka A, PMHNP       And   diphenhydrAMINE  (BENADRYL ) injection 50 mg  50 mg Intramuscular TID PRN Motley-Mangrum, Jadeka A, PMHNP       And   LORazepam  (ATIVAN ) injection 2 mg  2 mg Intramuscular TID PRN Motley-Mangrum, Jadeka A, PMHNP       divalproex  (DEPAKOTE  ER) 24 hr tablet 2,000 mg  2,000 mg Oral Q2000 Linnie Riches, Chinmay Squier, MD   2,000 mg at 07/20/23 2124   feeding supplement (ENSURE ENLIVE / ENSURE PLUS) liquid 237 mL  237 mL Oral BID BM Raia Amico, MD   237 mL at 07/21/23 1000   hydrOXYzine  (ATARAX ) tablet 25 mg  25 mg Oral TID PRN Motley-Mangrum, Jadeka A, PMHNP   25 mg at 07/15/23 2102   magnesium  hydroxide (MILK OF MAGNESIA) suspension 30 mL  30 mL Oral  Daily PRN Motley-Mangrum, Jadeka A, PMHNP       nicotine  polacrilex (NICORETTE ) gum 2 mg  2 mg Oral Q4H while awake Motley-Mangrum, Jadeka A, PMHNP   2 mg at 07/21/23 1117   risperiDONE  (RISPERDAL  M-TABS) disintegrating tablet 3 mg  3 mg Oral QHS Zouev, Dmitri, MD   3 mg at 07/20/23 2123   risperiDONE  (RISPERDAL  M-TABS) disintegrating tablet 3 mg  3 mg Oral Daily Zouev, Dmitri, MD   3 mg at 07/21/23 3664   traZODone  (DESYREL ) tablet 50 mg  50 mg Oral QHS PRN Ajibola, Ene A, NP   50 mg at 07/20/23 2123    Lab Results:  Results for orders placed or performed during the hospital encounter of 07/10/23 (from the past 48 hours)  Valproic acid  level     Status: None   Collection Time: 07/20/23  6:21 AM  Result Value Ref Range   Valproic Acid  Lvl 56 50 - 100 ug/mL    Comment: Performed at [facility], [address], [place], [place] [address]  Comprehensive metabolic panel     Status: Abnormal   Collection Time: 07/20/23  6:21 AM  Result Value Ref Range   Sodium 132 (L) 135 - 145 mmol/L   Potassium 4.0 3.5 - 5.1 mmol/L   Chloride 102 98 - 111 mmol/L   CO2 22 22 - 32 mmol/L   Glucose, Bld 89 70 - 99 mg/dL    Comment: Glucose reference range applies only to samples taken after fasting for at least 8 hours.   BUN 11 6 - 20 mg/dL   Creatinine, Ser 4.25 0.61 - 1.24 mg/dL   Calcium 8.7 (L) 8.9 - 10.3 mg/dL   Total Protein 7.6 6.5 - 8.1 g/dL   Albumin 4.0 3.5 - 5.0 g/dL   AST 19 15 - 41 U/L   ALT 20 0 - 44 U/L   Alkaline Phosphatase 42 38 - 126 U/L   Total Bilirubin 0.4 0.0 - 1.2 mg/dL   GFR, Estimated >  60 >60 mL/min    Comment: (NOTE) Calculated using the CKD-EPI Creatinine Equation (2021)    Anion gap 8 5 - 15    Comment: Performed at [facility], [address] [place], [place] [address]  Comprehensive metabolic panel     Status: None   Collection Time: 07/21/23  6:42 AM  Result Value Ref Range   Sodium 137 135 - 145 mmol/L   Potassium 4.1 3.5 -  5.1 mmol/L   Chloride 102 98 - 111 mmol/L   CO2 24 22 - 32 mmol/L   Glucose, Bld 98 70 - 99 mg/dL    Comment: Glucose reference range applies only to samples taken after fasting for at least 8 hours.   BUN 11 6 - 20 mg/dL   Creatinine, Ser 6.04 0.61 - 1.24 mg/dL   Calcium 9.1 8.9 - 54.0 mg/dL   Total Protein 8.1 6.5 - 8.1 g/dL   Albumin 4.1 3.5 - 5.0 g/dL   AST 19 15 - 41 U/L   ALT 21 0 - 44 U/L   Alkaline Phosphatase 44 38 - 126 U/L   Total Bilirubin 0.3 0.0 - 1.2 mg/dL   GFR, Estimated >98 >11 mL/min    Comment: (NOTE) Calculated using the CKD-EPI Creatinine Equation (2021)    Anion gap 11 5 - 15    Comment: Performed at [facility], [address] [place], [place] [address]     Blood Alcohol level:  Lab Results  Component Value Date   [facility] <15 07/10/2023   ETH <10 09/24/2021    Metabolic Disorder Labs: Lab Results  Component Value Date   HGBA1C 5.0 07/12/2023   MPG 96.8 07/12/2023   MPG 108.28 09/24/2021   Lab Results  Component Value Date   PROLACTIN 4.4 06/09/2020   Lab Results  Component Value Date   CHOL 126 07/12/2023   TRIG 48 07/12/2023   HDL 44 07/12/2023   CHOLHDL 2.9 07/12/2023   VLDL 10 07/12/2023   LDLCALC 72 07/12/2023   LDLCALC 81 09/24/2021    Physical Findings: AIMS:  , ,  ,  ,    CIWA:    COWS:     Musculoskeletal: Strength & Muscle Tone: within normal limits Gait & Station: normal Patient leans: N/A  Psychiatric Specialty Exam:  Presentation  General Appearance:  Appropriate for Environment; Casual  Eye Contact: Good  Speech: Clear but hyperverbal and tangential  Speech Volume: Normal  Handedness: Ambidextrous   Mood and Affect  Mood: Elevated, "Great!"  Affect: congruent   Thought Process  Thought Processes: tangential  Descriptions of Associations:Intact  Orientation:Full (Time, Place and Person)  Thought Content:grandiose  History of Schizophrenia/Schizoaffective disorder:No  data recorded Duration of Psychotic Symptoms:No data recorded Hallucinations:No data recorded  Ideas of Reference:None  Suicidal Thoughts:No data recorded  Homicidal Thoughts:No data recorded   Sensorium  Memory: Immediate Good; Recent Good  Judgment: Fair  Insight: Fair   Chartered certified accountant: Fair  Attention Span: Fair  Recall: [employer] of Knowledge: Fair  Language: Fair   Psychomotor Activity  Psychomotor Activity: No data recorded   Assets  Assets: Desire for Improvement; Resilience   Sleep  Sleep: No data recorded    Physical Exam: Physical Exam Vitals and nursing note reviewed.  Constitutional:      General: He is not in acute distress.    Appearance: Normal appearance. He is normal weight. He is not ill-appearing or toxic-appearing.  HENT:  Head: Normocephalic and atraumatic.  Pulmonary:     Effort: Pulmonary effort is normal.  Musculoskeletal:        General: Normal range of motion.  Neurological:     General: No focal deficit present.     Mental Status: He is alert.    Review of Systems  Respiratory:  Negative for cough and shortness of breath.   Cardiovascular:  Negative for chest pain.  Gastrointestinal:  Negative for abdominal pain, constipation, diarrhea, nausea and vomiting.  Neurological:  Negative for dizziness, weakness and headaches.  Psychiatric/Behavioral:  Negative for depression, hallucinations and suicidal ideas. The patient is not nervous/anxious.   All other systems reviewed and are negative.  Blood pressure 136/86, pulse 83, temperature 97.8 F (36.6 C), temperature source Oral, resp. rate 16, height 5\' 9"  (1.753 m), weight 63 kg, SpO2 100%. Body mass index is 20.53 kg/m.   Treatment Plan Summary: Daily contact with patient to assess and evaluate symptoms and progress in treatment and Medication management  Jay Woods is a 23 yr old male who presented on 5/17 to [facility] via law  enforcement under IVC for erratic aggressive behavior towards his family, he was admitted to [facility] on 5/18.  PPHx is significant for Schizoaffective Disorder, GAD, IED, Polysubstance Abuse (Benzo, Meth, THC), and 4 Prior Psychiatric Hospitalizations (last- [name]/2022), and no history of Suicide Attempts or Self Injurious Behavior.    Schizoaffective Disorder, Bipolar Type, current episode Manic  IED: - On 5/26, increased Risperdal  to 3 mg BID for mood stability and psychosis - 5/27, titrate Depakote  ER from 1500-2000 mg daily for mood stability -- 5/28 CMP within normal level with improved sodium 137, Depakote  level 56 still low therapeutic Recheck CMP 5/28, recheck Depakote  level 5/31 -Continue Agitation Protocol: Haldol /Ativan /Benadryl      Nicotine  Dependence: -Continue Nicotine  Gum 2 mg q4 PRN     -Continue Ensure 237 mL BID -Continue PRN's: Tylenol , Maalox, Atarax , Milk of Magnesia   --  The risks/benefits/side-effects/alternatives to medications were discussed in detail with the patient and time was given for questions. The patient consents to medication trials.                -- Metabolic profile and EKG monitoring obtained while on an atypical antipsychotic (BMI: Lipid Panel: HbgA1c: QTc:)              -- Encouraged patient to participate in unit milieu and in scheduled group therapies              -- Short Term Goals: Ability to identify changes in lifestyle to reduce recurrence of condition will improve, Ability to verbalize feelings will improve, Ability to disclose and discuss suicidal ideas, Ability to demonstrate self-control will improve, Ability to identify and develop effective coping behaviors will improve, Ability to maintain clinical measurements within normal limits will improve, Compliance with prescribed medications will improve, and Ability to identify triggers associated with substance abuse/mental health issues will improve             -- Long Term Goals:  Improvement in symptoms so as ready for discharge   Safety and Monitoring:             -- Involuntary admission to inpatient psychiatric unit for safety, stabilization and treatment             -- Daily contact with patient to assess and evaluate symptoms and progress in treatment             --  Patient's case to be discussed in multi-disciplinary team meeting             -- Observation Level : q15 minute checks             -- Vital signs:  q12 hours             -- Precautions: suicide, elopement, and assault  Discharge Planning:              -- Social work and case management to assist with discharge planning and identification of hospital follow-up needs prior to discharge             -- Estimated LOS: 2-3 more days, anticipated discharge Thursday or Friday             -- Discharge Concerns: Need to establish a safety plan; Medication compliance and effectiveness             -- Discharge Goals: Return home with outpatient referrals for mental health follow-up including medication management/psychotherapy   Jay Woods Linnie Riches, MD 07/21/2023, 11:49 AM

## 2023-07-21 NOTE — Progress Notes (Signed)
 Adult Psychoeducational Group Note  Date:  07/21/2023 Time:  8:58 PM  Group Topic/Focus:  Wrap-Up Group:   The focus of this group is to help patients review their daily goal of treatment and discuss progress on daily workbooks.  Participation Level:  Active  Participation Quality:  Appropriate  Affect:  Appropriate  Cognitive:  Appropriate  Insight: Appropriate  Engagement in Group:  Engaged  Modes of Intervention:  Discussion  Additional Comments:  The one positive thing that happen  to him  the love of his life came for a visit  good family visit   Caye Cocks 07/21/2023, 8:58 PM

## 2023-07-21 NOTE — Group Note (Signed)
 Recreation Therapy Group Note   Group Topic:Communication  Group Date: 07/21/2023 Start Time: 0945 End Time: 1009 Facilitators: Jyren Cerasoli-McCall, LRT,CTRS Location: [address] Day[address]   Group Topic: Communication, Problem Solving   Goal Area(s) Addresses:  Patient will effectively listen to complete activity.  Patient will identify communication skills used to make activity successful.  Patient will identify how skills used during activity can be used to reach post d/c goals.    Behavioral Response: Engaged  Intervention: Building surveyor Activity - Geometric pattern cards, pencils, blank paper    Activity: Geometric Drawings.  Three volunteers from the peer group will be shown an abstract picture with a particular arrangement of geometrical shapes.  Each round, one 'speaker' will describe the pattern, as accurately as possible without revealing the image to the group.  The remaining group members will listen and draw the picture to reflect how it is described to them. Patients with the role of 'listener' cannot ask clarifying questions but, may request that the speaker repeat a direction. Once the drawings are complete, the presenter will show the rest of the group the picture and compare how close each person came to drawing the picture. LRT will facilitate a post-activity discussion regarding effective communication and the importance of planning, listening, and asking for clarification in daily interactions with others.  Education: Environmental consultant, Active listening, Support systems, Discharge planning  Education Outcome: Acknowledges understanding/In group clarification offered/Needs additional education.    Affect/Mood: Appropriate   Participation Level: Engaged   Participation Quality: Independent   Behavior: Appropriate   Speech/Thought Process: Focused   Insight: Moderate   Judgement: Moderate   Modes of Intervention: Activity   Patient Response to  Interventions:  Engaged   Education Outcome:  In group clarification offered    Clinical Observations/Individualized Feedback: Pt attended and participated in group session.     Plan: Continue to engage patient in RT group sessions 2-3x/week.   Tiya Schrupp-McCall, LRT,CTRS  07/21/2023 12:34 PM

## 2023-07-21 NOTE — BH IP Treatment Plan (Signed)
 Interdisciplinary Treatment and Diagnostic Plan Update  07/21/2023 Time of Session: 12:00 PM - UPDATE Jay Woods MRN: [id number]  Principal Diagnosis: Schizoaffective disorder, bipolar type (HCC)  Secondary Diagnoses: Principal Problem:   Schizoaffective disorder, bipolar type (HCC)   Current Medications:  Current Facility-Administered Medications  Medication Dose Route Frequency Provider Last Rate Last Admin   acetaminophen  (TYLENOL ) tablet 650 mg  650 mg Oral Q6H PRN Motley-Mangrum, Jadeka A, PMHNP   650 mg at 07/20/23 1104   alum & mag hydroxide-simeth (MAALOX/MYLANTA) 200-200-20 MG/5ML suspension 30 mL  30 mL Oral Q4H PRN Motley-Mangrum, Jadeka A, PMHNP       haloperidol  (HALDOL ) tablet 5 mg  5 mg Oral TID PRN Motley-Mangrum, Jadeka A, PMHNP   5 mg at 07/11/23 9528   And   diphenhydrAMINE  (BENADRYL ) capsule 50 mg  50 mg Oral TID PRN Motley-Mangrum, Jadeka A, PMHNP   50 mg at 07/11/23 4132   haloperidol  lactate (HALDOL ) injection 5 mg  5 mg Intramuscular TID PRN Motley-Mangrum, Jadeka A, PMHNP       And   diphenhydrAMINE  (BENADRYL ) injection 50 mg  50 mg Intramuscular TID PRN Motley-Mangrum, Jadeka A, PMHNP       And   LORazepam  (ATIVAN ) injection 2 mg  2 mg Intramuscular TID PRN Motley-Mangrum, Jadeka A, PMHNP       haloperidol  lactate (HALDOL ) injection 10 mg  10 mg Intramuscular TID PRN Motley-Mangrum, Jadeka A, PMHNP       And   diphenhydrAMINE  (BENADRYL ) injection 50 mg  50 mg Intramuscular TID PRN Motley-Mangrum, Jadeka A, PMHNP       And   LORazepam  (ATIVAN ) injection 2 mg  2 mg Intramuscular TID PRN Motley-Mangrum, Jadeka A, PMHNP       divalproex  (DEPAKOTE  ER) 24 hr tablet 2,000 mg  2,000 mg Oral Q2000 Attiah, Nadir, MD   2,000 mg at 07/20/23 2124   feeding supplement (ENSURE ENLIVE / ENSURE PLUS) liquid 237 mL  237 mL Oral BID BM Attiah, Nadir, MD   237 mL at 07/21/23 1605   hydrOXYzine  (ATARAX ) tablet 25 mg  25 mg Oral TID PRN Motley-Mangrum, Jadeka A, PMHNP    25 mg at 07/15/23 2102   magnesium  hydroxide (MILK OF MAGNESIA) suspension 30 mL  30 mL Oral Daily PRN Motley-Mangrum, Jadeka A, PMHNP       nicotine  polacrilex (NICORETTE ) gum 2 mg  2 mg Oral Q4H while awake Motley-Mangrum, Jadeka A, PMHNP   2 mg at 07/21/23 1117   risperiDONE  (RISPERDAL  M-TABS) disintegrating tablet 3 mg  3 mg Oral QHS Zouev, Dmitri, MD   3 mg at 07/20/23 2123   risperiDONE  (RISPERDAL  M-TABS) disintegrating tablet 3 mg  3 mg Oral Daily Zouev, Dmitri, MD   3 mg at 07/21/23 4401   traZODone  (DESYREL ) tablet 50 mg  50 mg Oral QHS PRN Ajibola, Ene A, NP   50 mg at 07/20/23 2123   PTA Medications: No medications prior to admission.    Patient Stressors:    Patient Strengths:    Treatment Modalities: Medication Management, Group therapy, Case management,  1 to 1 session with clinician, Psychoeducation, Recreational therapy.   Physician Treatment Plan for Primary Diagnosis: Schizoaffective disorder, bipolar type (HCC) Long Term Goal(s): Improvement in symptoms so as ready for discharge   Short Term Goals: Ability to identify changes in lifestyle to reduce recurrence of condition will improve Ability to maintain clinical measurements within normal limits will improve Compliance with prescribed medications will improve Ability to identify triggers  associated with substance abuse/mental health issues will improve  Medication Management: Evaluate patient's response, side effects, and tolerance of medication regimen.  Therapeutic Interventions: 1 to 1 sessions, Unit Group sessions and Medication administration.  Evaluation of Outcomes: Progressing  Physician Treatment Plan for Secondary Diagnosis: Principal Problem:   Schizoaffective disorder, bipolar type (HCC)  Long Term Goal(s): Improvement in symptoms so as ready for discharge   Short Term Goals: Ability to identify changes in lifestyle to reduce recurrence of condition will improve Ability to maintain clinical  measurements within normal limits will improve Compliance with prescribed medications will improve Ability to identify triggers associated with substance abuse/mental health issues will improve     Medication Management: Evaluate patient's response, side effects, and tolerance of medication regimen.  Therapeutic Interventions: 1 to 1 sessions, Unit Group sessions and Medication administration.  Evaluation of Outcomes: Progressing   RN Treatment Plan for Primary Diagnosis: Schizoaffective disorder, bipolar type (HCC) Long Term Goal(s): Knowledge of disease and therapeutic regimen to maintain health will improve  Short Term Goals: Ability to remain free from injury will improve, Ability to verbalize frustration and anger appropriately will improve, Ability to verbalize feelings will improve, and Ability to disclose and discuss suicidal ideas  Medication Management: RN will administer medications as ordered by provider, will assess and evaluate patient's response and provide education to patient for prescribed medication. RN will report any adverse and/or side effects to prescribing provider.  Therapeutic Interventions: 1 on 1 counseling sessions, Psychoeducation, Medication administration, Evaluate responses to treatment, Monitor vital signs and CBGs as ordered, Perform/monitor CIWA, COWS, AIMS and Fall Risk screenings as ordered, Perform wound care treatments as ordered.  Evaluation of Outcomes: Progressing   LCSW Treatment Plan for Primary Diagnosis: Schizoaffective disorder, bipolar type (HCC) Long Term Goal(s): Safe transition to appropriate next level of care at discharge, Engage patient in therapeutic group addressing interpersonal concerns.  Short Term Goals: Engage patient in aftercare planning with referrals and resources, Increase ability to appropriately verbalize feelings, Facilitate acceptance of mental health diagnosis and concerns, and Identify triggers associated with mental  health/substance abuse issues  Therapeutic Interventions: Assess for all discharge needs, 1 to 1 time with Social worker, Explore available resources and support systems, Assess for adequacy in community support network, Educate family and significant other(s) on suicide prevention, Complete Psychosocial Assessment, Interpersonal group therapy.  Evaluation of Outcomes: Progressing   Progress in Treatment: Attending groups: Yes. Participating in groups: Yes. Taking medication as prescribed: Yes. Toleration medication: Yes. Family/Significant other contact made: Attempted to contact Julian Obey (mother) [phone] 4 times Patient understands diagnosis: Yes. Discussing patient identified problems/goals with staff: Yes. Medical problems stabilized or resolved: Yes. Denies suicidal/homicidal ideation: Yes. Issues/concerns per patient self-inventory: No.   New problem(s) identified: No   New Short Term/Long Term Goal(s): medication stabilization, elimination of SI thoughts, development of comprehensive mental wellness plan.    Patient Goals:  "Calm down, be calm."   Discharge Plan or Barriers: Patient recently admitted. CSW will continue to follow and assess for appropriate referrals and possible discharge planning.      Reason for Continuation of Hospitalization: Anxiety Mania Medication stabilization Other; describe mood stabilization, discharge planning   Estimated Length of Stay:  2 - 3 days  Last 3 [place] Suicide Severity Risk Score: Flowsheet Row [facility] (Current[facility] Most recent reading at 07/10/2023  3:00 PM ED from 07/10/2023 in [facility] at [facility] Most recent reading at  07/10/2023  2:46 AM UC from 03/22/2023 in [facility] at [place] Most recent reading at 03/22/2023  6:12 PM  C-SSRS RISK CATEGORY No Risk No Risk No Risk       Last PHQ 2/9  Scores:    10/06/2021   10:12 AM 10/06/2021    8:34 AM  Depression screen PHQ 2/9  Decreased Interest 0 1  Down, Depressed, Hopeless 0 3  PHQ - 2 Score 0 4  Altered sleeping 0 0  Tired, decreased energy 0 1  Change in appetite 0 1  Trouble concentrating 0 3  Moving slowly or fidgety/restless 0 3  Suicidal thoughts 0 0  PHQ-9 Score 0 12  Difficult doing work/chores  Not difficult at all    Scribe for Treatment Team: Patriciann Becht O Adrina Armijo, LCSWA 07/21/2023 5:09 PM

## 2023-07-21 NOTE — Plan of Care (Signed)
   Problem: Activity: Goal: Interest or engagement in activities will improve Outcome: Progressing   Problem: Coping: Goal: Ability to verbalize frustrations and anger appropriately will improve Outcome: Progressing   Problem: Safety: Goal: Periods of time without injury will increase Outcome: Progressing

## 2023-07-21 NOTE — Progress Notes (Signed)
   07/21/23 0800  Psych Admission Type (Psych Patients Only)  Admission Status Involuntary  Psychosocial Assessment  Patient Complaints None  Eye Contact Fair  Facial Expression Animated  Affect Appropriate to circumstance  Speech Logical/coherent  Interaction Minimal  Motor Activity Other (Comment) (WDL)  Appearance/Hygiene Unremarkable  Behavior Characteristics Cooperative;Appropriate to situation  Mood Pleasant  Aggressive Behavior  Effect No apparent injury  Thought Process  Coherency WDL  Content WDL  Delusions Paranoid  Perception Hallucinations  Hallucination Auditory  Judgment Limited  Confusion None  Danger to Self  Current suicidal ideation? Denies  Agreement Not to Harm Self Yes  Description of Agreement Verbal  Danger to Others  Danger to Others None reported or observed

## 2023-07-21 NOTE — Plan of Care (Signed)

## 2023-07-21 NOTE — Group Note (Unsigned)
 Therapy Group Note  Group Topic:Other  Group Date: 07/21/2023 Start Time: 1430 End Time: 1518 Facilitators: Filip Luten G, OT    Group OT session titled "Media Detox Challenge" was conducted with approximately 20 adolescent patients in the inpatient behavioral health unit. The group focused on exploring the impact of media/screen use on emotional regulation, occupational balance, and routine development. Patients were provided with structured handouts and guided through a step-by-step process that included individual reflection, small group planning, and large group discussion. Tasks included identifying current screen time habits, emotional effects of media use, and collaboratively designing a 24-hour "media detox" plan with non-screen-based alternatives.      Participation Level: {OT B[facility] Participation RUEAV:40981}   Participation Quality: {OT B[facility] Participation Quality:26268}   Behavior: {BHH OT Group Behavior:26269}   Speech/Thought Process: {BHH OT Speech/Thought Process:26270}   Affect/Mood: {OT BHH Affect/Mood:26271}   Insight: {OT BHH Insight:26272}   Judgement: {OT BHH Judgement:26272}   Individualization: *** was *** in their participation of group discussion/activity. *** identified  Modes of Intervention: {BHH MODES OF INTERVENTION:26273}  Patient Response to Interventions:  {BHH OT Patient Response to Interventions:26274}   Plan: Continue to engage patient in OT groups 2 - 3x/week.  07/21/2023  Lynnda Sas, OT

## 2023-07-22 NOTE — Progress Notes (Signed)
 [facility] MD Progress Note  07/22/2023 10:40 AM Jay Woods  MRN:  [id number] Subjective:   Jay Woods is a 23 yr old male who presented on 5/17 to [facility] via law enforcement under IVC for erratic aggressive behavior towards his family, he was admitted to [facility] on 5/18. PPHx is significant for Schizoaffective Disorder, GAD, IED, Polysubstance Abuse (Benzo, Meth, THC), and 4 Prior Psychiatric Hospitalizations (last- [name] 07/2022), and no history of Suicide Attempts or Self Injurious Behavior.    Case was discussed in the multidisciplinary team. MAR was reviewed and patient was compliant with medications.  He received as needed trazodone  almost nightly, received hydroxyzine  with last use 5/22, no as needed medication needed were given for agitation or aggression  Per staff report on 5/29 patient is interactive in groups and in the milieu, appropriate with no report of episodes of responding to stimuli.  On interview today patient continues to report doing good he presents excited with some boost to speech but appropriate in general "I am doing good I do like you as a doctor you are helping me" he shows this provider as sheet where he wrote facts and positive statements and drew across he tells me that he did this last night after group as it was talking about positives "blessings in my life" he presents mildly hyper but he notes that this is his normal "I am happy as a person" he tells me that he spoke with his girlfriend yesterday as well as his mother.  I attempted to contact his mother today for collateral information and to discuss current progress but no response.  Patient reports good sleep and appetite, denies depressed mood, denies racing thoughts or emotional lability or crying spells or mood swings.  He denies passive or active SI intention or plan, denies HI or AVH.  He denies side effect of medications and does not appear sleepy, does not display any signs consistent with EPS or  TD. Plan is to contact patient's mother or girlfriend again to discuss his baseline and current progress.  Depakote  dose was titrated with Depakote  level due on 5/31, will follow.  Principal Problem: Schizoaffective disorder, bipolar type (HCC) Diagnosis: Principal Problem:   Schizoaffective disorder, bipolar type (HCC)  Total Time spent with patient:  I personally spent 35 minutes on the unit in direct patient care. The direct patient care time included face-to-face time with the patient, reviewing the patient's chart, communicating with other professionals, and coordinating care. Greater than 50% of this time was spent in counseling or coordinating care with the patient regarding goals of hospitalization, psycho-education, and discharge planning needs.   Past Psychiatric History:  Schizoaffective Disorder, GAD, IED, Polysubstance Abuse (Benzo, Meth, THC), and 4 Prior Psychiatric Hospitalizations (last- [name]/2022), and no history of Suicide Attempts or Self Injurious Behavior.   Past Medical History:  Past Medical History:  Diagnosis Date   Eczema    Schizoaffective disorder (HCC)    History reviewed. No pertinent surgical history. Family History:  Family History  Problem Relation Age of Onset   Healthy Mother    Healthy Father    Family Psychiatric  History:  He reports possible family diagnosis' Reports No Known Suicides or Substance Abuse   Social History:  Social History   Substance and Sexual Activity  Alcohol Use Not Currently   Alcohol/week: 1.0 standard drink of alcohol   Types: 1 Cans of beer per week   Comment: last use 09/30/21     Social History  Substance and Sexual Activity  Drug Use Not Currently   Comment: Hx of cocaine, marijuana, k2, Xanax, and Molly on and off for the past 5 years    Social History   Socioeconomic History   Marital status: Single    Spouse name: Not on file   Number of children: Not on file   Years of education: Not on  file   Highest education level: Not on file  Occupational History   Not on file  Tobacco Use   Smoking status: Former    Current packs/day: 0.25    Average packs/day: 0.3 packs/day for 5.0 years (1.3 ttl pk-yrs)    Types: Cigarettes   Smokeless tobacco: Former  Building services engineer status: Every Day   Substances: CBD  Substance and Sexual Activity   Alcohol use: Not Currently    Alcohol/week: 1.0 standard drink of alcohol    Types: 1 Cans of beer per week    Comment: last use 09/30/21   Drug use: Not Currently    Comment: Hx of cocaine, marijuana, k2, Xanax, and Molly on and off for the past 5 years   Sexual activity: Yes    Partners: Female    Birth control/protection: None  Other Topics Concern   Not on file  Social History Narrative   Not on file   Social Drivers of Health   Financial Resource Strain: High Risk (10/06/2021)   Overall Financial Resource Strain (CARDIA)    Difficulty of Paying Living Expenses: Very hard  Food Insecurity: Food Insecurity Present (07/10/2023)   Hunger Vital Sign    Worried About Running Out of Food in the Last Year: Sometimes true    Ran Out of Food in the Last Year: Sometimes true  Transportation Needs: Unmet Transportation Needs (07/10/2023)   PRAPARE - Transportation    Lack of Transportation (Medical): Yes    Lack of Transportation (Non-Medical): Yes  Physical Activity: Sufficiently Active (10/06/2021)   Exercise Vital Sign    Days of Exercise per Week: 7 days    Minutes of Exercise per Session: 30 min  Stress: No Stress Concern Present (10/06/2021)   [employer] of Occupational Health - Occupational Stress Questionnaire    Feeling of Stress : Only a little  Social Connections: Socially Integrated (10/06/2021)   Social Connection and Isolation Panel [NHANES]    Frequency of Communication with Friends and Family: More than three times a week    Frequency of Social Gatherings with Friends and Family: More than three times a week     Attends Religious Services: More than 4 times per year    Active Member of [employer] or Organizations: No    Attends Engineer, structural: More than 4 times per year    Marital Status: Living with partner   Additional Social History:                         Sleep: Good  Appetite:  Good  Current Medications: Current Facility-Administered Medications  Medication Dose Route Frequency Provider Last Rate Last Admin   acetaminophen  (TYLENOL ) tablet 650 mg  650 mg Oral Q6H PRN Motley-Mangrum, Jadeka A, PMHNP   650 mg at 07/20/23 1104   alum & mag hydroxide-simeth (MAALOX/MYLANTA) 200-200-20 MG/5ML suspension 30 mL  30 mL Oral Q4H PRN Motley-Mangrum, Jadeka A, PMHNP       haloperidol  (HALDOL ) tablet 5 mg  5 mg Oral TID PRN Motley-Mangrum, Jadeka A, PMHNP  5 mg at 07/11/23 6962   And   diphenhydrAMINE  (BENADRYL ) capsule 50 mg  50 mg Oral TID PRN Motley-Mangrum, Jadeka A, PMHNP   50 mg at 07/11/23 9528   haloperidol  lactate (HALDOL ) injection 5 mg  5 mg Intramuscular TID PRN Motley-Mangrum, Jadeka A, PMHNP       And   diphenhydrAMINE  (BENADRYL ) injection 50 mg  50 mg Intramuscular TID PRN Motley-Mangrum, Jadeka A, PMHNP       And   LORazepam  (ATIVAN ) injection 2 mg  2 mg Intramuscular TID PRN Motley-Mangrum, Jadeka A, PMHNP       haloperidol  lactate (HALDOL ) injection 10 mg  10 mg Intramuscular TID PRN Motley-Mangrum, Jadeka A, PMHNP       And   diphenhydrAMINE  (BENADRYL ) injection 50 mg  50 mg Intramuscular TID PRN Motley-Mangrum, Jadeka A, PMHNP       And   LORazepam  (ATIVAN ) injection 2 mg  2 mg Intramuscular TID PRN Motley-Mangrum, Jadeka A, PMHNP       divalproex  (DEPAKOTE  ER) 24 hr tablet 2,000 mg  2,000 mg Oral Q2000 Kyaire Gruenewald, MD   2,000 mg at 07/21/23 2103   feeding supplement (ENSURE ENLIVE / ENSURE PLUS) liquid 237 mL  237 mL Oral BID BM Coyt Govoni, MD   237 mL at 07/22/23 0840   hydrOXYzine  (ATARAX ) tablet 25 mg  25 mg Oral TID PRN Motley-Mangrum, Jadeka A,  PMHNP   25 mg at 07/15/23 2102   magnesium  hydroxide (MILK OF MAGNESIA) suspension 30 mL  30 mL Oral Daily PRN Motley-Mangrum, Jadeka A, PMHNP       nicotine  polacrilex (NICORETTE ) gum 2 mg  2 mg Oral Q4H while awake Motley-Mangrum, Jadeka A, PMHNP   2 mg at 07/22/23 4132   risperiDONE  (RISPERDAL  M-TABS) disintegrating tablet 3 mg  3 mg Oral QHS Zouev, Dmitri, MD   3 mg at 07/21/23 2102   risperiDONE  (RISPERDAL  M-TABS) disintegrating tablet 3 mg  3 mg Oral Daily Zouev, Dmitri, MD   3 mg at 07/22/23 0839   traZODone  (DESYREL ) tablet 50 mg  50 mg Oral QHS PRN Ajibola, Ene A, NP   50 mg at 07/21/23 2247    Lab Results:  Results for orders placed or performed during the hospital encounter of 07/10/23 (from the past 48 hours)  Comprehensive metabolic panel     Status: None   Collection Time: 07/21/23  6:42 AM  Result Value Ref Range   Sodium 137 135 - 145 mmol/L   Potassium 4.1 3.5 - 5.1 mmol/L   Chloride 102 98 - 111 mmol/L   CO2 24 22 - 32 mmol/L   Glucose, Bld 98 70 - 99 mg/dL    Comment: Glucose reference range applies only to samples taken after fasting for at least 8 hours.   BUN 11 6 - 20 mg/dL   Creatinine, Ser 4.40 0.61 - 1.24 mg/dL   Calcium 9.1 8.9 - 10.2 mg/dL   Total Protein 8.1 6.5 - 8.1 g/dL   Albumin 4.1 3.5 - 5.0 g/dL   AST 19 15 - 41 U/L   ALT 21 0 - 44 U/L   Alkaline Phosphatase 44 38 - 126 U/L   Total Bilirubin 0.3 0.0 - 1.2 mg/dL   GFR, Estimated >72 >53 mL/min    Comment: (NOTE) Calculated using the CKD-EPI Creatinine Equation (2021)    Anion gap 11 5 - 15    Comment: Performed at [facility], [address] [place], [place] [address]  Blood Alcohol level:  Lab Results  Component Value Date   [facility] <15 07/10/2023   ETH <10 09/24/2021    Metabolic Disorder Labs: Lab Results  Component Value Date   HGBA1C 5.0 07/12/2023   MPG 96.8 07/12/2023   MPG 108.28 09/24/2021   Lab Results  Component Value Date   PROLACTIN 4.4 06/09/2020    Lab Results  Component Value Date   CHOL 126 07/12/2023   TRIG 48 07/12/2023   HDL 44 07/12/2023   CHOLHDL 2.9 07/12/2023   VLDL 10 07/12/2023   LDLCALC 72 07/12/2023   LDLCALC 81 09/24/2021    Physical Findings: AIMS:  , ,  ,  ,    CIWA:    COWS:     Musculoskeletal: Strength & Muscle Tone: within normal limits Gait & Station: normal Patient leans: N/A  Psychiatric Specialty Exam:  Presentation  General Appearance:  Appropriate for Environment; Casual  Eye Contact: Good  Speech: Mildly increased, mildly pushed  Speech Volume: Normal  Handedness: Ambidextrous   Mood and Affect  Mood: Mildly euphoric, questionable baseline  Affect: congruent   Thought Process  Thought Processes: Circumstantial but linear in general  Descriptions of Associations:Intact  Orientation:Full (Time, Place and Person)  Thought Content: No grandiosity or paranoia or delusions noted, identifies goals in life in a linear manner  History of Schizophrenia/Schizoaffective disorder:No data recorded Duration of Psychotic Symptoms:No data recorded Hallucinations:No data recorded Denies AVH, does not appear responding to stimuli Ideas of Reference:None  Suicidal Thoughts:No data recorded Denies passive or active SI intention or plan Homicidal Thoughts:No data recorded Denies HI  Sensorium  Memory: Immediate Good; Recent Good  Judgment: Fair  Insight: Fair   Chartered certified accountant: Fair  Attention Span: Fair  Recall: [employer] of Knowledge: Fair  Language: Fair   Psychomotor Activity  Psychomotor Activity: No data recorded   Assets  Assets: Desire for Improvement; Resilience   Sleep  Sleep: No data recorded    Physical Exam: Physical Exam Vitals and nursing note reviewed.  Constitutional:      General: He is not in acute distress.    Appearance: Normal appearance. He is normal weight. He is not ill-appearing or  toxic-appearing.  HENT:     Head: Normocephalic and atraumatic.  Pulmonary:     Effort: Pulmonary effort is normal.  Musculoskeletal:        General: Normal range of motion.  Neurological:     General: No focal deficit present.     Mental Status: He is alert.    Review of Systems  Respiratory:  Negative for cough and shortness of breath.   Cardiovascular:  Negative for chest pain.  Gastrointestinal:  Negative for abdominal pain, constipation, diarrhea, nausea and vomiting.  Neurological:  Negative for dizziness, weakness and headaches.  Psychiatric/Behavioral:  Negative for depression, hallucinations and suicidal ideas. The patient is not nervous/anxious.   All other systems reviewed and are negative.  Blood pressure (!) 142/87, pulse 72, temperature 98.4 F (36.9 C), temperature source Oral, resp. rate 16, height 5\' 9"  (1.753 m), weight 63 kg, SpO2 100%. Body mass index is 20.53 kg/m.   Treatment Plan Summary: Daily contact with patient to assess and evaluate symptoms and progress in treatment and Medication management  Jay Woods is a 23 yr old male who presented on 5/17 to [facility] via law enforcement under IVC for erratic aggressive behavior towards his family, he was admitted to [facility] on 5/18.  PPHx is significant for Schizoaffective  Disorder, GAD, IED, Polysubstance Abuse (Benzo, Meth, THC), and 4 Prior Psychiatric Hospitalizations (last- [name]/20[facility]), and no history of Suicide Attempts or Self Injurious Behavior.    Schizoaffective Disorder, Bipolar Type, current episode Manic  IED: - On 5/26, increased Risperdal  to 3 mg BID for mood stability and psychosis - 5/27, titrate Depakote  ER from 1500-2000 mg daily for mood stability -- 5/28 CMP within normal level with improved sodium 137, Depakote  level 56 still low therapeutic Recheck CMP 5/28, recheck Depakote  level 5/31 -Continue Agitation Protocol: Haldol /Ativan /Benadryl      Nicotine  Dependence: -Continue  Nicotine  Gum 2 mg q4 PRN     -Continue Ensure 237 mL BID -Continue PRN's: Tylenol , Maalox, Atarax , Milk of Magnesia   --  The risks/benefits/side-effects/alternatives to medications were discussed in detail with the patient and time was given for questions. The patient consents to medication trials.                -- Metabolic profile and EKG monitoring obtained while on an atypical antipsychotic (BMI: Lipid Panel: HbgA1c: QTc:)              -- Encouraged patient to participate in unit milieu and in scheduled group therapies              -- Short Term Goals: Ability to identify changes in lifestyle to reduce recurrence of condition will improve, Ability to verbalize feelings will improve, Ability to disclose and discuss suicidal ideas, Ability to demonstrate self-control will improve, Ability to identify and develop effective coping behaviors will improve, Ability to maintain clinical measurements within normal limits will improve, Compliance with prescribed medications will improve, and Ability to identify triggers associated with substance abuse/mental health issues will improve             -- Long Term Goals: Improvement in symptoms so as ready for discharge   Safety and Monitoring:             -- Involuntary admission to inpatient psychiatric unit for safety, stabilization and treatment             -- Daily contact with patient to assess and evaluate symptoms and progress in treatment             -- Patient's case to be discussed in multi-disciplinary team meeting             -- Observation Level : q15 minute checks             -- Vital signs:  q12 hours             -- Precautions: suicide, elopement, and assault  Discharge Planning:              -- Social work and case management to assist with discharge planning and identification of hospital follow-up needs prior to discharge             -- Estimated LOS: 2-3 more days, anticipated discharge Thursday or Friday             --  Discharge Concerns: Need to establish a safety plan; Medication compliance and effectiveness             -- Discharge Goals: Return home with outpatient referrals for mental health follow-up including medication management/psychotherapy   Roxie Kreeger Linnie Riches, MD 07/22/2023, 10:40 AM

## 2023-07-22 NOTE — Group Note (Signed)
 Date:  07/22/2023 Time:  9:12 AM  Group Topic/Focus:  Goals Group:   The focus of this group is to help patients establish daily goals to achieve during treatment and discuss how the patient can incorporate goal setting into their daily lives to aide in recovery.    Participation Level:  Active  Participation Quality:  Appropriate  Affect:  Appropriate   Jay Woods 07/22/2023, 9:12 AM

## 2023-07-22 NOTE — Group Note (Signed)
 Date:  07/22/2023 Time:  9:02 PM  Group Topic/Focus:  Wrap-Up Group:   The focus of this group is to help patients review their daily goal of treatment and discuss progress on daily workbooks.    Participation Level:  Active  Participation Quality:  Appropriate and Attentive  Affect:  Appropriate  Cognitive:  Alert and Appropriate  Insight: Appropriate and Good  Engagement in Group:  Engaged  Modes of Intervention:  Discussion and Education  Additional Comments:  Pt attended and participated in wrap up group this evening and rated their day a 10/10. Pt stated that they were able to speak with their loved ones which boosted their day. Pt goal is to work on their temper and their feelings. When writer asked pt to elaborate more about what they mean by "feelings", they were unable to explain further. Pt states that music and church are coping skills to help with their anger. Pt has no complaints to relay at this time.   Eligah Grow 07/22/2023, 9:02 PM

## 2023-07-22 NOTE — Group Note (Signed)
 LCSW Group Therapy Note   Group Date: 07/22/2023 Start Time: 1100 End Time: 1200   Participation:  did not attend  Type of Therapy:  Group Therapy  Topic:  Finding Balance: Using Wise Mind for Thoughtful Decisions  Objective:  To help participants understand and apply the concept of Agustina Aldrich Mind to make balanced, thoughtful decisions by integrating emotion and logic.  Goals: Learn the differences between Emotional Mind, Reasonable Mind, and [employer]. Recognize personal signs of Emotional and Reasonable Mind. Practice using [employer] in real-life scenarios.  Therapeutic Modalities: Elements of Dialectical Behavior Therapy (DBT):  Mindfulness (noticing thoughts and emotions without judgment), Emotion Regulation (understanding and managing emotional responses), Distress Tolerance (coping with difficult situations without making them worse), Wise Mind (integrating emotion and reason for balanced decision-making) Elements of Cognitive Behavioral Therapy (CBT):  Identifying automatic thoughts, Challenging cognitive distortions, Using logic to reframe unhelpful thinking patterns  Summary:  This class focused on Wise Mind - DBT's concept of balancing Emotional Mind and Reasonable Mind. We identified when we're in each state and practiced using Wise Mind to respond thoughtfully in real-life situations. By combining emotion and logic, participants can improve decision-making, manage challenges, and enhance relationships.   Tamatha Gadbois O Jobin Montelongo, LCSWA 07/22/2023  6:11 PM

## 2023-07-22 NOTE — Progress Notes (Signed)
   07/22/23 1300  Psych Admission Type (Psych Patients Only)  Admission Status Involuntary  Psychosocial Assessment  Patient Complaints None  Eye Contact Fair  Facial Expression Animated  Affect Euphoric;Silly  Speech Logical/coherent  Interaction Assertive  Motor Activity Fidgety  Appearance/Hygiene Unremarkable  Behavior Characteristics Cooperative  Mood Pleasant;Other (Comment) (Pt appeared euphoric at times and was loud on the phone with family talking about how much he loves them in a poetic manner.)  Thought Process  Coherency WDL  Content WDL  Delusions None reported or observed  Perception WDL  Hallucination None reported or observed  Judgment Limited  Confusion None  Danger to Self  Current suicidal ideation? Denies  Self-Injurious Behavior No self-injurious ideation or behavior indicators observed or expressed   Agreement Not to Harm Self Yes  Description of Agreement Verbal  Danger to Others  Danger to Others None reported or observed

## 2023-07-22 NOTE — Progress Notes (Signed)
   07/22/23 2200  Psych Admission Type (Psych Patients Only)  Admission Status Involuntary  Psychosocial Assessment  Patient Complaints None  Eye Contact Fair  Facial Expression Animated  Affect Euphoric  Speech Logical/coherent  Interaction Assertive  Motor Activity Fidgety  Appearance/Hygiene Unremarkable  Behavior Characteristics Cooperative  Mood Pleasant  Thought Process  Coherency WDL  Content WDL  Delusions None reported or observed  Perception WDL  Hallucination None reported or observed  Judgment Limited  Confusion None  Danger to Self  Current suicidal ideation? Denies  Self-Injurious Behavior No self-injurious ideation or behavior indicators observed or expressed   Agreement Not to Harm Self Yes  Description of Agreement verbal  Danger to Others  Danger to Others None reported or observed

## 2023-07-22 NOTE — Plan of Care (Signed)
   Problem: Education: Goal: Knowledge of [facility] Education information/materials will improve Outcome: Progressing Goal: Emotional status will improve Outcome: Progressing Goal: Mental status will improve Outcome: Progressing Goal: Verbalization of understanding the information provided will improve Outcome: Progressing

## 2023-07-23 NOTE — Group Note (Signed)
 Recreation Therapy Group Note   Group Topic:Team Building  Group Date: 07/23/2023 Start Time: 0940 End Time: 1005 Facilitators: Shefali Ng-McCall, LRT,CTRS Location: [address] Dayroom   Group Topic: Communication, Team Building, Problem Solving  Goal Area(s) Addresses:  Patient will effectively work with peer towards shared goal.  Patient will identify skills used to make activity successful.  Patient will identify how skills used during activity can be used to reach post d/c goals.   Behavioral Response:   Intervention: STEM Activity  Activity: Straw Bridge. In teams of 3-5, patients were given 15 plastic drinking straws and an equal length of masking tape. Using the materials provided, patients were instructed to build a free standing bridge-like structure to suspend an everyday item (ex: puzzle box) off of the floor or table surface. All materials were required to be used by the team in their design. LRT facilitated post-activity discussion reviewing team process. Patients were encouraged to reflect how the skills used in this activity can be generalized to daily life post discharge.   Education: Pharmacist, community, Scientist, physiological, Discharge Planning   Education Outcome: Acknowledges education/In group clarification offered/Needs additional education.    Affect/Mood: N/A   Participation Level: Did not attend    Clinical Observations/Individualized Feedback:     Plan: Continue to engage patient in RT group sessions 2-3x/week.   Avie Checo-McCall, LRT,CTRS  07/23/2023 12:24 PM

## 2023-07-23 NOTE — Progress Notes (Signed)
 [facility] MD Progress Note  07/23/2023 11:31 AM Jay Woods  MRN:  [id number] Subjective:   Jay Woods is a 23 yr old male who presented on 5/17 to [facility] via law enforcement under IVC for erratic aggressive behavior towards his family, he was admitted to [facility] on 5/18. PPHx is significant for Schizoaffective Disorder, GAD, IED, Polysubstance Abuse (Benzo, Meth, THC), and 4 Prior Psychiatric Hospitalizations (last- [name]/2022), and no history of Suicide Attempts or Self Injurious Behavior.    Case was discussed in the multidisciplinary team. MAR was reviewed and patient was compliant with medications.  He received as needed trazodone  almost nightly, received hydroxyzine  with last use 5/22, no as needed medication needed were given for agitation or aggression  Per staff report on 5/30 patient is interactive in groups and in the milieu, appropriate with no report of episodes of responding to stimuli yet occasionally presenting euphoric to staff.  On interview today patient was evaluated in his room, he continues to present stable in general reporting he had a good day yesterday was able to talk to his kids and girlfriend and phone call went well, he reports good sleep and appetite.  He denies mood swings or easy irritability or crying spells or any symptoms consistent with mania or racing thoughts, he denies feeling depressed or anxious he denies SI HI or AVH.  He is compliant with medications with no side effects reported.  He presents with appropriate speech no racing thoughts noted during evaluation he does not present euphoric until the end of the interview when he gets excited noting "you are my superhero thank you" after I discussed with him plan to obtain his Depakote  level tomorrow morning and to contact his girlfriend to ensure improvement and clarify if back to baseline   I attempted to contact patient's girlfriend if that at [phone] today 5/30 with no response.     Depakote  dose was titrated with Depakote  level due on 5/31, will follow.  Principal Problem: Schizoaffective disorder, bipolar type (HCC) Diagnosis: Principal Problem:   Schizoaffective disorder, bipolar type (HCC)  Total Time spent with patient:  I personally spent 35 minutes on the unit in direct patient care. The direct patient care time included face-to-face time with the patient, reviewing the patient's chart, communicating with other professionals, and coordinating care. Greater than 50% of this time was spent in counseling or coordinating care with the patient regarding goals of hospitalization, psycho-education, and discharge planning needs.   Past Psychiatric History:  Schizoaffective Disorder, GAD, IED, Polysubstance Abuse (Benzo, Meth, THC), and 4 Prior Psychiatric Hospitalizations (last- [name]/2022), and no history of Suicide Attempts or Self Injurious Behavior.   Past Medical History:  Past Medical History:  Diagnosis Date   Eczema    Schizoaffective disorder (HCC)    History reviewed. No pertinent surgical history. Family History:  Family History  Problem Relation Age of Onset   Healthy Mother    Healthy Father    Family Psychiatric  History:  He reports possible family diagnosis' Reports No Known Suicides or Substance Abuse   Social History:  Social History   Substance and Sexual Activity  Alcohol Use Not Currently   Alcohol/week: 1.0 standard drink of alcohol   Types: 1 Cans of beer per week   Comment: last use 09/30/21     Social History   Substance and Sexual Activity  Drug Use Not Currently   Comment: Hx of cocaine, marijuana, k2, Xanax, and Molly on and off for the  past 5 years    Social History   Socioeconomic History   Marital status: Single    Spouse name: Not on file   Number of children: Not on file   Years of education: Not on file   Highest education level: Not on file  Occupational History   Not on file  Tobacco Use   Smoking  status: Former    Current packs/day: 0.25    Average packs/day: 0.3 packs/day for 5.0 years (1.3 ttl pk-yrs)    Types: Cigarettes   Smokeless tobacco: Former  Building services engineer status: Every Day   Substances: CBD  Substance and Sexual Activity   Alcohol use: Not Currently    Alcohol/week: 1.0 standard drink of alcohol    Types: 1 Cans of beer per week    Comment: last use 09/30/21   Drug use: Not Currently    Comment: Hx of cocaine, marijuana, k2, Xanax, and Molly on and off for the past 5 years   Sexual activity: Yes    Partners: Female    Birth control/protection: None  Other Topics Concern   Not on file  Social History Narrative   Not on file   Social Drivers of Health   Financial Resource Strain: High Risk (10/06/2021)   Overall Financial Resource Strain (CARDIA)    Difficulty of Paying Living Expenses: Very hard  Food Insecurity: Food Insecurity Present (07/10/2023)   Hunger Vital Sign    Worried About Running Out of Food in the Last Year: Sometimes true    Ran Out of Food in the Last Year: Sometimes true  Transportation Needs: Unmet Transportation Needs (07/10/2023)   PRAPARE - Transportation    Lack of Transportation (Medical): Yes    Lack of Transportation (Non-Medical): Yes  Physical Activity: Sufficiently Active (10/06/2021)   Exercise Vital Sign    Days of Exercise per Week: 7 days    Minutes of Exercise per Session: 30 min  Stress: No Stress Concern Present (10/06/2021)   [employer] of Occupational Health - Occupational Stress Questionnaire    Feeling of Stress : Only a little  Social Connections: Socially Integrated (10/06/2021)   Social Connection and Isolation Panel [NHANES]    Frequency of Communication with Friends and Family: More than three times a week    Frequency of Social Gatherings with Friends and Family: More than three times a week    Attends Religious Services: More than 4 times per year    Active Member of [employer] or Organizations: No     Attends Engineer, structural: More than 4 times per year    Marital Status: Living with partner   Additional Social History:                         Sleep: Good  Appetite:  Good  Current Medications: Current Facility-Administered Medications  Medication Dose Route Frequency Provider Last Rate Last Admin   acetaminophen  (TYLENOL ) tablet 650 mg  650 mg Oral Q6H PRN Motley-Mangrum, Jadeka A, PMHNP   650 mg at 07/20/23 1104   alum & mag hydroxide-simeth (MAALOX/MYLANTA) 200-200-20 MG/5ML suspension 30 mL  30 mL Oral Q4H PRN Motley-Mangrum, Jadeka A, PMHNP       haloperidol  (HALDOL ) tablet 5 mg  5 mg Oral TID PRN Motley-Mangrum, Jadeka A, PMHNP   5 mg at 07/11/23 8416   And   diphenhydrAMINE  (BENADRYL ) capsule 50 mg  50 mg Oral TID PRN Motley-Mangrum, Jadeka A, PMHNP  50 mg at 07/11/23 1610   haloperidol  lactate (HALDOL ) injection 5 mg  5 mg Intramuscular TID PRN Motley-Mangrum, Jadeka A, PMHNP       And   diphenhydrAMINE  (BENADRYL ) injection 50 mg  50 mg Intramuscular TID PRN Motley-Mangrum, Jadeka A, PMHNP       And   LORazepam  (ATIVAN ) injection 2 mg  2 mg Intramuscular TID PRN Motley-Mangrum, Jadeka A, PMHNP       haloperidol  lactate (HALDOL ) injection 10 mg  10 mg Intramuscular TID PRN Motley-Mangrum, Jadeka A, PMHNP       And   diphenhydrAMINE  (BENADRYL ) injection 50 mg  50 mg Intramuscular TID PRN Motley-Mangrum, Jadeka A, PMHNP       And   LORazepam  (ATIVAN ) injection 2 mg  2 mg Intramuscular TID PRN Motley-Mangrum, Jadeka A, PMHNP       divalproex  (DEPAKOTE  ER) 24 hr tablet 2,000 mg  2,000 mg Oral Q2000 Sheneka Schrom, MD   2,000 mg at 07/22/23 2118   feeding supplement (ENSURE ENLIVE / ENSURE PLUS) liquid 237 mL  237 mL Oral BID BM Brittyn Salaz, MD   237 mL at 07/22/23 1600   hydrOXYzine  (ATARAX ) tablet 25 mg  25 mg Oral TID PRN Motley-Mangrum, Jadeka A, PMHNP   25 mg at 07/15/23 2102   magnesium  hydroxide (MILK OF MAGNESIA) suspension 30 mL  30 mL Oral Daily  PRN Motley-Mangrum, Jadeka A, PMHNP       nicotine  polacrilex (NICORETTE ) gum 2 mg  2 mg Oral Q4H while awake Motley-Mangrum, Jadeka A, PMHNP   2 mg at 07/22/23 2119   risperiDONE  (RISPERDAL  M-TABS) disintegrating tablet 3 mg  3 mg Oral QHS Zouev, Dmitri, MD   3 mg at 07/22/23 2119   risperiDONE  (RISPERDAL  M-TABS) disintegrating tablet 3 mg  3 mg Oral Daily Zouev, Dmitri, MD   3 mg at 07/23/23 0759   traZODone  (DESYREL ) tablet 50 mg  50 mg Oral QHS PRN Ajibola, Ene A, NP   50 mg at 07/22/23 2119    Lab Results:  No results found for this or any previous visit (from the past 48 hours).    Blood Alcohol level:  Lab Results  Component Value Date   [facility] <15 07/10/2023   ETH <10 09/24/2021    Metabolic Disorder Labs: Lab Results  Component Value Date   HGBA1C 5.0 07/12/2023   MPG 96.8 07/12/2023   MPG 108.28 09/24/2021   Lab Results  Component Value Date   PROLACTIN 4.4 06/09/2020   Lab Results  Component Value Date   CHOL 126 07/12/2023   TRIG 48 07/12/2023   HDL 44 07/12/2023   CHOLHDL 2.9 07/12/2023   VLDL 10 07/12/2023   LDLCALC 72 07/12/2023   LDLCALC 81 09/24/2021    Physical Findings: AIMS:  , ,  ,  ,    CIWA:    COWS:     Musculoskeletal: Strength & Muscle Tone: within normal limits Gait & Station: normal Patient leans: N/A  Psychiatric Specialty Exam:  Presentation  General Appearance:  Appropriate for Environment; Casual  Eye Contact: Good  Speech: No pushed or pressured speech noted  Speech Volume: Normal  Handedness: Ambidextrous   Mood and Affect  Mood: Very slightly euphoric towards the end of the interview  Affect: congruent   Thought Process  Thought Processes: Circumstantial but linear in general  Descriptions of Associations:Intact  Orientation:Full (Time, Place and Person)  Thought Content: No grandiosity or paranoia or delusions noted, identifies goals in life in  a linear manner  History of  Schizophrenia/Schizoaffective disorder:No data recorded Duration of Psychotic Symptoms:No data recorded Hallucinations:No data recorded Denies AVH, does not appear responding to stimuli Ideas of Reference:None  Suicidal Thoughts:No data recorded Denies passive or active SI intention or plan Homicidal Thoughts:No data recorded Denies HI  Sensorium  Memory: Immediate Good; Recent Good  Judgment: Fair  Insight: Fair   Chartered certified accountant: Fair  Attention Span: Fair  Recall: [employer] of Knowledge: Fair  Language: Fair   Psychomotor Activity  Psychomotor Activity: No data recorded   Assets  Assets: Desire for Improvement; Resilience   Sleep  Sleep: No data recorded    Physical Exam: Physical Exam Vitals and nursing note reviewed.  Constitutional:      General: He is not in acute distress.    Appearance: Normal appearance. He is normal weight. He is not ill-appearing or toxic-appearing.  HENT:     Head: Normocephalic and atraumatic.  Pulmonary:     Effort: Pulmonary effort is normal.  Musculoskeletal:        General: Normal range of motion.  Neurological:     General: No focal deficit present.     Mental Status: He is alert.    Review of Systems  Respiratory:  Negative for cough and shortness of breath.   Cardiovascular:  Negative for chest pain.  Gastrointestinal:  Negative for abdominal pain, constipation, diarrhea, nausea and vomiting.  Neurological:  Negative for dizziness, weakness and headaches.  Psychiatric/Behavioral:  Negative for depression, hallucinations and suicidal ideas. The patient is not nervous/anxious.   All other systems reviewed and are negative.  Blood pressure (!) 147/83, pulse 78, temperature 98.5 F (36.9 C), temperature source Oral, resp. rate 16, height 5\' 9"  (1.753 m), weight 63 kg, SpO2 100%. Body mass index is 20.53 kg/m.   Treatment Plan Summary: Daily contact with patient to assess and  evaluate symptoms and progress in treatment and Medication management  Ellison Woods is a 23 yr old male who presented on 5/17 to [facility] via law enforcement under IVC for erratic aggressive behavior towards his family, he was admitted to [facility] on 5/18.  PPHx is significant for Schizoaffective Disorder, GAD, IED, Polysubstance Abuse (Benzo, Meth, THC), and 4 Prior Psychiatric Hospitalizations (last- [name]/2022), and no history of Suicide Attempts or Self Injurious Behavior.    Schizoaffective Disorder, Bipolar Type, current episode Manic  IED: - On 5/26, increased Risperdal  to 3 mg BID for mood stability and psychosis - 5/27, titrate Depakote  ER from 1500-2000 mg daily for mood stability -- 5/28 CMP within normal level with improved sodium 137, Depakote  level 56 still low therapeutic Recheck CMP 5/28, recheck Depakote  level 5/31 -Continue Agitation Protocol: Haldol /Ativan /Benadryl      Nicotine  Dependence: -Continue Nicotine  Gum 2 mg q4 PRN     -Continue Ensure 237 mL BID -Continue PRN's: Tylenol , Maalox, Atarax , Milk of Magnesia   --  The risks/benefits/side-effects/alternatives to medications were discussed in detail with the patient and time was given for questions. The patient consents to medication trials.                -- Metabolic profile and EKG monitoring obtained while on an atypical antipsychotic (BMI: Lipid Panel: HbgA1c: QTc:)              -- Encouraged patient to participate in unit milieu and in scheduled group therapies              -- Short Term Goals: Ability  to identify changes in lifestyle to reduce recurrence of condition will improve, Ability to verbalize feelings will improve, Ability to disclose and discuss suicidal ideas, Ability to demonstrate self-control will improve, Ability to identify and develop effective coping behaviors will improve, Ability to maintain clinical measurements within normal limits will improve, Compliance with prescribed  medications will improve, and Ability to identify triggers associated with substance abuse/mental health issues will improve             -- Long Term Goals: Improvement in symptoms so as ready for discharge   Safety and Monitoring:             -- Involuntary admission to inpatient psychiatric unit for safety, stabilization and treatment             -- Daily contact with patient to assess and evaluate symptoms and progress in treatment             -- Patient's case to be discussed in multi-disciplinary team meeting             -- Observation Level : q15 minute checks             -- Vital signs:  q12 hours             -- Precautions: suicide, elopement, and assault  Discharge Planning:              -- Social work and case management to assist with discharge planning and identification of hospital follow-up needs prior to discharge             -- Estimated LOS: 2-3 more days, anticipated discharge Thursday or Friday             -- Discharge Concerns: Need to establish a safety plan; Medication compliance and effectiveness             -- Discharge Goals: Return home with outpatient referrals for mental health follow-up including medication management/psychotherapy   Malick Netz Linnie Riches, MD 07/23/2023, 11:31 AM

## 2023-07-23 NOTE — Group Note (Signed)
 Date:  07/23/2023 Time:  8:50 PM  Group Topic/Focus:  Wrap-Up Group:   The focus of this group is to help patients review their daily goal of treatment and discuss progress on daily workbooks.    Additional Comments:  Pt attended the AA meeting this evening.   Jay Woods 07/23/2023, 8:50 PM

## 2023-07-23 NOTE — [facility] Suicide Risk Assessment (Signed)
 [facility] INPATIENT:  Family/Significant Other Suicide Prevention Education  Suicide Prevention Education:  Education Completed; Jodeen Munch, girlfriend/mother of child [phone]) ,  (name of family member/significant other) has been identified by the patient as the family member/significant other with whom the patient will be residing, and identified as the person(s) who will aid the patient in the event of a mental health crisis (suicidal ideations/suicide attempt).  With written consent from the patient, the family member/significant other has been provided the following suicide prevention education, prior to the and/or following the discharge of the patient.  The suicide prevention education provided includes the following: Suicide risk factors Suicide prevention and interventions National Suicide Hotline telephone number [facility] assessment telephone number [facility] Emergency Assistance [address] [place] and/or Residential Mobile Crisis Unit telephone number  Request made of family/significant other to: Remove weapons (e.g., guns, rifles, knives), all items previously/currently identified as safety concern.   Remove drugs/medications (over-the-counter, prescriptions, illicit drugs), all items previously/currently identified as a safety concern.  The family member/significant other verbalizes understanding of the suicide prevention education information provided.  The family member/significant other agrees to remove the items of safety concern listed above.  Claudio Culver 07/23/2023, 10:06 AM

## 2023-07-23 NOTE — Group Note (Signed)
 Date:  07/23/2023 Time:  9:08 AM  Group Topic/Focus:  Goals Group:   The focus of this group is to help patients establish daily goals to achieve during treatment and discuss how the patient can incorporate goal setting into their daily lives to aide in recovery.    Participation Level:  Did Not Attend   Ellan Gunner 07/23/2023, 9:08 AM

## 2023-07-23 NOTE — Group Note (Signed)
 Date:  07/23/2023 Time:  9:43 PM  Group Topic/Focus:  Wrap-Up Group:   The focus of this group is to help patients review their daily goal of treatment and discuss progress on daily workbooks.    Participation Level:  Active  Participation Quality:  Appropriate  Affect:  Appropriate  Cognitive:  Appropriate  Insight: Appropriate  Engagement in Group:  Engaged  Modes of Intervention:  Discussion  Additional Comments:  Pt had a good day  Dellia Ferguson 07/23/2023, 9:43 PM

## 2023-07-23 NOTE — Progress Notes (Signed)
   07/23/23 0600  15 Minute Checks  Location Bedroom  Visual Appearance Calm  Behavior Sleeping  Sleep (Behavioral Health Patients Only)  Calculate sleep? (Click Yes once per 24 hr at 0600 safety check) Yes  Documented sleep last 24 hours 8.5

## 2023-07-24 LAB — VALPROIC ACID LEVEL: Valproic Acid Lvl: 89 ug/mL (ref 50–100)

## 2023-07-24 NOTE — Progress Notes (Signed)
 [facility] MD Progress Note  07/24/2023 10:18 AM Jay Woods  MRN:  [id number] Subjective:   Jay Woods is a 23 yr old male who presented on 5/17 to [facility] via law enforcement under IVC for erratic aggressive behavior towards his family, he was admitted to [facility] on 5/18. PPHx is significant for Schizoaffective Disorder, GAD, IED, Polysubstance Abuse (Benzo, Meth, THC), and 4 Prior Psychiatric Hospitalizations (last- [name]/2022), and no history of Suicide Attempts or Self Injurious Behavior.    Case was discussed in the multidisciplinary team. MAR was reviewed and patient was compliant with medications.  He received as needed trazodone  almost nightly, received hydroxyzine  with last use 5/22, no as needed medication needed were given for agitation or aggression  Per staff report on 5/30 patient is interactive in groups and in the milieu, appropriate with no report of episodes of responding to stimuli yet occasionally presenting euphoric to staff.  On interview today patient was evaluated in his room, he continues to present stable in general reporting attending groups and interacting with the milieu, reports good sleep and appetite, continues to deny SI HI or AVH, he continues to deny any symptoms consistent with mania and he describes himself at baseline to be "happy and excited" he presents very slightly hyperverbal but redirectable and much improved since admission.  He denies side effect of medications and does not appear sleepy or with any sign of EPS or TD.  He reports phone calls with mother and girlfriend yesterday that went well. With patient's permission I did speak with his girlfriend today 5/31 who reports that patient is much improved since admission and currently at baseline.  Trough Depakote  level this morning therapeutic at 89.  Discussed with patient's family plan to discharge tomorrow morning, patient's girlfriend is in agreement with current treatment plan and  discharge plan.  Principal Problem: Schizoaffective disorder, bipolar type (HCC) Diagnosis: Principal Problem:   Schizoaffective disorder, bipolar type (HCC)  Total Time spent with patient:  I personally spent 35 minutes on the unit in direct patient care. The direct patient care time included face-to-face time with the patient, reviewing the patient's chart, communicating with other professionals, and coordinating care. Greater than 50% of this time was spent in counseling or coordinating care with the patient regarding goals of hospitalization, psycho-education, and discharge planning needs.   Past Psychiatric History:  Schizoaffective Disorder, GAD, IED, Polysubstance Abuse (Benzo, Meth, THC), and 4 Prior Psychiatric Hospitalizations (last- [name]/2022), and no history of Suicide Attempts or Self Injurious Behavior.   Past Medical History:  Past Medical History:  Diagnosis Date   Eczema    Schizoaffective disorder (HCC)    History reviewed. No pertinent surgical history. Family History:  Family History  Problem Relation Age of Onset   Healthy Mother    Healthy Father    Family Psychiatric  History:  He reports possible family diagnosis' Reports No Known Suicides or Substance Abuse   Social History:  Social History   Substance and Sexual Activity  Alcohol Use Not Currently   Alcohol/week: 1.0 standard drink of alcohol   Types: 1 Cans of beer per week   Comment: last use 09/30/21     Social History   Substance and Sexual Activity  Drug Use Not Currently   Comment: Hx of cocaine, marijuana, k2, Xanax, and Molly on and off for the past 5 years    Social History   Socioeconomic History   Marital status: Single    Spouse name: Not  on file   Number of children: Not on file   Years of education: Not on file   Highest education level: Not on file  Occupational History   Not on file  Tobacco Use   Smoking status: Former    Current packs/day: 0.25    Average  packs/day: 0.3 packs/day for 5.0 years (1.3 ttl pk-yrs)    Types: Cigarettes   Smokeless tobacco: Former  Building services engineer status: Every Day   Substances: CBD  Substance and Sexual Activity   Alcohol use: Not Currently    Alcohol/week: 1.0 standard drink of alcohol    Types: 1 Cans of beer per week    Comment: last use 09/30/21   Drug use: Not Currently    Comment: Hx of cocaine, marijuana, k2, Xanax, and Molly on and off for the past 5 years   Sexual activity: Yes    Partners: Female    Birth control/protection: None  Other Topics Concern   Not on file  Social History Narrative   Not on file   Social Drivers of Health   Financial Resource Strain: High Risk (10/06/2021)   Overall Financial Resource Strain (CARDIA)    Difficulty of Paying Living Expenses: Very hard  Food Insecurity: Food Insecurity Present (07/10/2023)   Hunger Vital Sign    Worried About Running Out of Food in the Last Year: Sometimes true    Ran Out of Food in the Last Year: Sometimes true  Transportation Needs: Unmet Transportation Needs (07/10/2023)   PRAPARE - Transportation    Lack of Transportation (Medical): Yes    Lack of Transportation (Non-Medical): Yes  Physical Activity: Sufficiently Active (10/06/2021)   Exercise Vital Sign    Days of Exercise per Week: 7 days    Minutes of Exercise per Session: 30 min  Stress: No Stress Concern Present (10/06/2021)   [employer] of Occupational Health - Occupational Stress Questionnaire    Feeling of Stress : Only a little  Social Connections: Socially Integrated (10/06/2021)   Social Connection and Isolation Panel [NHANES]    Frequency of Communication with Friends and Family: More than three times a week    Frequency of Social Gatherings with Friends and Family: More than three times a week    Attends Religious Services: More than 4 times per year    Active Member of [employer] or Organizations: No    Attends Engineer, structural: More than 4 times  per year    Marital Status: Living with partner   Additional Social History:                         Sleep: Good  Appetite:  Good  Current Medications: Current Facility-Administered Medications  Medication Dose Route Frequency Provider Last Rate Last Admin   acetaminophen  (TYLENOL ) tablet 650 mg  650 mg Oral Q6H PRN Motley-Mangrum, Jadeka A, PMHNP   650 mg at 07/20/23 1104   alum & mag hydroxide-simeth (MAALOX/MYLANTA) 200-200-20 MG/5ML suspension 30 mL  30 mL Oral Q4H PRN Motley-Mangrum, Jadeka A, PMHNP       haloperidol  (HALDOL ) tablet 5 mg  5 mg Oral TID PRN Motley-Mangrum, Jadeka A, PMHNP   5 mg at 07/11/23 4782   And   diphenhydrAMINE  (BENADRYL ) capsule 50 mg  50 mg Oral TID PRN Motley-Mangrum, Jadeka A, PMHNP   50 mg at 07/11/23 9562   haloperidol  lactate (HALDOL ) injection 5 mg  5 mg Intramuscular TID PRN Motley-Mangrum, Jadeka  A, PMHNP       And   diphenhydrAMINE  (BENADRYL ) injection 50 mg  50 mg Intramuscular TID PRN Motley-Mangrum, Jadeka A, PMHNP       And   LORazepam  (ATIVAN ) injection 2 mg  2 mg Intramuscular TID PRN Motley-Mangrum, Jadeka A, PMHNP       haloperidol  lactate (HALDOL ) injection 10 mg  10 mg Intramuscular TID PRN Motley-Mangrum, Jadeka A, PMHNP       And   diphenhydrAMINE  (BENADRYL ) injection 50 mg  50 mg Intramuscular TID PRN Motley-Mangrum, Jadeka A, PMHNP       And   LORazepam  (ATIVAN ) injection 2 mg  2 mg Intramuscular TID PRN Motley-Mangrum, Jadeka A, PMHNP       divalproex  (DEPAKOTE  ER) 24 hr tablet 2,000 mg  2,000 mg Oral Q2000 Laurelle Skiver, MD   2,000 mg at 07/23/23 2010   feeding supplement (ENSURE ENLIVE / ENSURE PLUS) liquid 237 mL  237 mL Oral BID BM Clint Biello, MD   237 mL at 07/24/23 0919   hydrOXYzine  (ATARAX ) tablet 25 mg  25 mg Oral TID PRN Motley-Mangrum, Jadeka A, PMHNP   25 mg at 07/15/23 2102   magnesium  hydroxide (MILK OF MAGNESIA) suspension 30 mL  30 mL Oral Daily PRN Motley-Mangrum, Jadeka A, PMHNP       nicotine   polacrilex (NICORETTE ) gum 2 mg  2 mg Oral Q4H while awake Motley-Mangrum, Jadeka A, PMHNP   2 mg at 07/24/23 8119   risperiDONE  (RISPERDAL  M-TABS) disintegrating tablet 3 mg  3 mg Oral QHS Zouev, Dmitri, MD   3 mg at 07/23/23 2145   risperiDONE  (RISPERDAL  M-TABS) disintegrating tablet 3 mg  3 mg Oral Daily Zouev, Dmitri, MD   3 mg at 07/24/23 0920   traZODone  (DESYREL ) tablet 50 mg  50 mg Oral QHS PRN Ajibola, Ene A, NP   50 mg at 07/22/23 2119    Lab Results:  Results for orders placed or performed during the hospital encounter of 07/10/23 (from the past 48 hours)  Valproic acid  level     Status: None   Collection Time: 07/24/23  9:02 AM  Result Value Ref Range   Valproic Acid  Lvl 89 50 - 100 ug/mL    Comment: Performed at [facility], [address] [place], [place] [address]      Blood Alcohol level:  Lab Results  Component Value Date   [facility] <15 07/10/2023   ETH <10 09/24/2021    Metabolic Disorder Labs: Lab Results  Component Value Date   HGBA1C 5.0 07/12/2023   MPG 96.8 07/12/2023   MPG 108.28 09/24/2021   Lab Results  Component Value Date   PROLACTIN 4.4 06/09/2020   Lab Results  Component Value Date   CHOL 126 07/12/2023   TRIG 48 07/12/2023   HDL 44 07/12/2023   CHOLHDL 2.9 07/12/2023   VLDL 10 07/12/2023   LDLCALC 72 07/12/2023   LDLCALC 81 09/24/2021    Physical Findings: AIMS:  , ,  ,  ,    CIWA:    COWS:     Musculoskeletal: Strength & Muscle Tone: within normal limits Gait & Station: normal Patient leans: N/A  Psychiatric Specialty Exam:  Presentation  General Appearance:  Appropriate for Environment; Casual  Eye Contact: Good  Speech: No pushed or pressured speech noted  Speech Volume: Normal  Handedness: Ambidextrous   Mood and Affect  Mood: Very slightly euphoric towards the end of the interview  Affect: congruent   Thought  Process  Thought Processes: Circumstantial but linear in  general  Descriptions of Associations:Intact  Orientation:Full (Time, Place and Person)  Thought Content: No grandiosity or paranoia or delusions noted, identifies goals in life in a linear manner  History of Schizophrenia/Schizoaffective disorder:No data recorded Duration of Psychotic Symptoms:No data recorded Hallucinations:No data recorded Denies AVH, does not appear responding to stimuli Ideas of Reference:None  Suicidal Thoughts:No data recorded Denies passive or active SI intention or plan Homicidal Thoughts:No data recorded Denies HI  Sensorium  Memory: Immediate Good; Recent Good  Judgment: Fair  Insight: Fair   Chartered certified accountant: Fair  Attention Span: Fair  Recall: [employer] of Knowledge: Fair  Language: Fair   Psychomotor Activity  Psychomotor Activity: No data recorded   Assets  Assets: Desire for Improvement; Resilience   Sleep  Sleep: No data recorded    Physical Exam: Physical Exam Vitals and nursing note reviewed.  Constitutional:      General: He is not in acute distress.    Appearance: Normal appearance. He is normal weight. He is not ill-appearing or toxic-appearing.  HENT:     Head: Normocephalic and atraumatic.  Pulmonary:     Effort: Pulmonary effort is normal.  Musculoskeletal:        General: Normal range of motion.  Neurological:     General: No focal deficit present.     Mental Status: He is alert.    Review of Systems  Respiratory:  Negative for cough and shortness of breath.   Cardiovascular:  Negative for chest pain.  Gastrointestinal:  Negative for abdominal pain, constipation, diarrhea, nausea and vomiting.  Neurological:  Negative for dizziness, weakness and headaches.  Psychiatric/Behavioral:  Negative for depression, hallucinations and suicidal ideas. The patient is not nervous/anxious.   All other systems reviewed and are negative.  Blood pressure (!) 134/91, pulse 79,  temperature 98 F (36.7 C), temperature source Oral, resp. rate 12, height 5\' 9"  (1.753 m), weight 63 kg, SpO2 100%. Body mass index is 20.53 kg/m.   Treatment Plan Summary: Daily contact with patient to assess and evaluate symptoms and progress in treatment and Medication management  Jay Woods is a 23 yr old male who presented on 5/17 to [facility] via law enforcement under IVC for erratic aggressive behavior towards his family, he was admitted to [facility] on 5/18.  PPHx is significant for Schizoaffective Disorder, GAD, IED, Polysubstance Abuse (Benzo, Meth, THC), and 4 Prior Psychiatric Hospitalizations (last- [name]/2022), and no history of Suicide Attempts or Self Injurious Behavior.    Schizoaffective Disorder, Bipolar Type, current episode Manic  IED: - On 5/26, increased Risperdal  to 3 mg BID for mood stability and psychosis - 5/27, titrate Depakote  ER from 1500-2000 mg daily for mood stability -- 5/28 CMP within normal level with improved sodium 137, Depakote  level 5/31 therapeutic 89 -Continue Agitation Protocol: Haldol /Ativan /Benadryl      Nicotine  Dependence: -Continue Nicotine  Gum 2 mg q4 PRN     -Continue Ensure 237 mL BID -Continue PRN's: Tylenol , Maalox, Atarax , Milk of Magnesia   --  The risks/benefits/side-effects/alternatives to medications were discussed in detail with the patient and time was given for questions. The patient consents to medication trials.                -- Metabolic profile and EKG monitoring obtained while on an atypical antipsychotic (BMI: Lipid Panel: HbgA1c: QTc:)              -- Encouraged patient to participate  in unit milieu and in scheduled group therapies              -- Short Term Goals: Ability to identify changes in lifestyle to reduce recurrence of condition will improve, Ability to verbalize feelings will improve, Ability to disclose and discuss suicidal ideas, Ability to demonstrate self-control will improve, Ability to  identify and develop effective coping behaviors will improve, Ability to maintain clinical measurements within normal limits will improve, Compliance with prescribed medications will improve, and Ability to identify triggers associated with substance abuse/mental health issues will improve             -- Long Term Goals: Improvement in symptoms so as ready for discharge   Safety and Monitoring:             -- Involuntary admission to inpatient psychiatric unit for safety, stabilization and treatment             -- Daily contact with patient to assess and evaluate symptoms and progress in treatment             -- Patient's case to be discussed in multi-disciplinary team meeting             -- Observation Level : q15 minute checks             -- Vital signs:  q12 hours             -- Precautions: suicide, elopement, and assault  Discharge Planning:              -- Social work and case management to assist with discharge planning and identification of hospital follow-up needs prior to discharge             -- Estimated LOS: 2-3 more days, anticipated discharge Thursday or Friday             -- Discharge Concerns: Need to establish a safety plan; Medication compliance and effectiveness             -- Discharge Goals: Return home with outpatient referrals for mental health follow-up including medication management/psychotherapy   Dantrell Schertzer Linnie Riches, MD 07/24/2023, 10:18 AM

## 2023-07-24 NOTE — Progress Notes (Signed)
   07/24/23 2113  Psych Admission Type (Psych Patients Only)  Admission Status Involuntary  Psychosocial Assessment  Patient Complaints Anxiety  Eye Contact Fair  Facial Expression Animated  Affect Appropriate to circumstance;Euphoric  Speech Logical/coherent;English as a second language teacher;Restless  Appearance/Hygiene Unremarkable  Behavior Characteristics Cooperative;Appropriate to situation;Anxious  Mood Pleasant  Thought Process  Coherency Tangential;Flight of ideas  Content WDL  Delusions None reported or observed  Perception WDL  Hallucination None reported or observed  Judgment Poor  Confusion None

## 2023-07-24 NOTE — [facility] Group Notes (Signed)
 [facility] Group Notes:  (Nursing/MHT/Case Management/Adjunct)  Date:  07/24/2023  Time:  2000  Type of Therapy:  Wrap up group  Participation Level:  Active  Participation Quality:  Appropriate, Attentive, Sharing, and Supportive  Affect:  Appropriate  Cognitive:  Alert  Insight:  Improving  Engagement in Group:  Engaged  Modes of Intervention:  Clarification, Education, and Socialization  Summary of Progress/Problems: Positive thinking and self-care were discussed.   Catharine Clock 07/24/2023, 9:35 PM

## 2023-07-24 NOTE — Plan of Care (Signed)

## 2023-07-24 NOTE — Progress Notes (Signed)
   07/24/23 1500  Psych Admission Type (Psych Patients Only)  Admission Status Involuntary  Psychosocial Assessment  Patient Complaints Hyperactivity  Eye Contact Fair  Facial Expression Animated  Affect Euphoric  Speech Logical/coherent;Other (Comment) (hyperverbal with flight of ideas)  Interaction Assertive  Motor Activity Restless  Appearance/Hygiene Unremarkable  Behavior Characteristics Cooperative;Impulsive  Mood Pleasant  Thought Chartered certified accountant of ideas  Content WDL  Delusions None reported or observed  Perception WDL  Hallucination None reported or observed  Judgment Limited  Confusion None  Danger to Self  Current suicidal ideation? Denies  Self-Injurious Behavior No self-injurious ideation or behavior indicators observed or expressed   Agreement Not to Harm Self Yes  Description of Agreement Verbal  Danger to Others  Danger to Others None reported or observed

## 2023-07-24 NOTE — Progress Notes (Signed)
 Pt visible in the milieu.  Interacting appropriate with staff and peers.  Pt rates depression and anxiety 0.  Denied SI, HI, and AVH.  Pt reports he is expecting to be d/c on Monday and that he also has court on Monday.  Needs assessed.  Pt denied.  Safety checks continue for patient safety.  Pt safe on unit.

## 2023-07-24 NOTE — Group Note (Signed)
 Date:  07/24/2023 Time:  9:06 AM  Group Topic/Focus:  Goals Group:   The focus of this group is to help patients establish daily goals to achieve during treatment and discuss how the patient can incorporate goal setting into their daily lives to aide in recovery.    Participation Level:  Active  Participation Quality:  Intrusive and Monopolizing  Affect:  Appropriate  Cognitive:  Confused  Insight: Lacking  Engagement in Group:  Lacking  Modes of Intervention:  Confrontation and Limit-setting  Additional Comments:  Pt was delivering advice to others and was asked not to offer opinions.   Vallarie Gauze 07/24/2023, 9:06 AM

## 2023-07-25 MED ORDER — HYDROXYZINE HCL 25 MG PO TABS: 25.0000 mg | ORAL_TABLET | Freq: Three times a day (TID) | ORAL | 0 refills | Status: DC | PRN

## 2023-07-25 MED ORDER — DIVALPROEX SODIUM ER 500 MG PO TB24: 2000.0000 mg | ORAL_TABLET | Freq: Every day | ORAL | 0 refills | Status: DC

## 2023-07-25 MED ORDER — RISPERIDONE 2 MG PO TABS: 3.0000 mg | ORAL_TABLET | Freq: Two times a day (BID) | ORAL | 0 refills | Status: DC

## 2023-07-25 MED ORDER — TRAZODONE HCL 50 MG PO TABS: 50.0000 mg | ORAL_TABLET | Freq: Every evening | ORAL | 0 refills | Status: DC | PRN

## 2023-07-25 NOTE — Progress Notes (Signed)
 Nurse discussed anxiety, depression and coping skills with patient.

## 2023-07-25 NOTE — Group Note (Signed)
 Date:  07/25/2023 Time:  1:03 PM  Group Topic/Focus:   Setbacks in Recovery:   The focus of this group is to identify unhealthy thought patterns and develop a plan to handle them in a healthier way upon discharge.    Participation Level:  Minimal  Participation Quality:  Appropriate  Affect:  Appropriate  Cognitive:  Appropriate  Insight: Appropriate  Engagement in Group:  Lacking  Modes of Intervention:  Discussion and Education  Additional Comments:    Sheryl Donna 07/25/2023, 1:03 PM

## 2023-07-25 NOTE — [facility] Suicide Risk Assessment (Signed)
 [facility] Discharge Suicide Risk Assessment   Principal Problem: Schizoaffective disorder, bipolar type [facility]) Discharge Diagnoses: Principal Problem:   Schizoaffective disorder, bipolar type (HCC)   Total Time spent with patient: 45 minutes  Reason for admission: Jay Woods is a 23 yr old male who presented on 5/17 to [facility] via law enforcement under IVC for erratic aggressive behavior towards his family, he was admitted to [facility] on 5/18. PPHx is significant for Schizoaffective Disorder, GAD, IED, Polysubstance Abuse (Benzo, Meth, THC), and 4 Prior Psychiatric Hospitalizations (last- [name] 07/2022), and no history of Suicide Attempts or Self Injurious Behavior.   PTA Medications:  None prior to admission  Hospital Course:   During the patient's hospitalization, patient had extensive initial psychiatric evaluation, and follow-up psychiatric evaluations every day.  Psychiatric diagnoses provided upon initial assessment: Schizoaffective disorder bipolar type  Patient's psychiatric medications were adjusted on admission: Patient was started on risperidone  and Depakote  at another facility prior to admission here, at time of admission to this facility risperidone  was increased to 1 mg twice daily and Depakote  ER was continued 500 mg daily for mood stability, he was started on Ativan  1 mg twice daily for mood stabilization for 2 days  During the hospitalization, other adjustments were made to the patient's psychiatric medication regimen: Depakote  ER was titrated up gradually to 2000 mg daily, risperidone  dose was titrated up gradually to 3 mg twice daily, Ativan  was discontinued later during hospital stay.  Patient was offered LAI but he declined. Depakote  level was obtained the day prior to discharge, was therapeutic at 89 Patient's care was discussed during the interdisciplinary team meeting every day during the hospitalization.  The patient denied having side effects to prescribed  psychiatric medication.  Gradually, patient started adjusting to milieu. The patient was evaluated each day by a clinical provider to ascertain response to treatment. Improvement was noted by the patient's report of decreasing symptoms, improved sleep and appetite, affect, medication tolerance, behavior, and participation in unit programming.  Patient was asked each day to complete a self inventory noting mood, mental status, pain, new symptoms, anxiety and concerns.    Symptoms were reported as significantly decreased or resolved completely by discharge.  Patient's family was contacted today prior to discharge, patient's girlfriend confirms that patient is currently at baseline and denied any imminent concern about patient harming self or others.  Patient and family was counseled regarding need to comply with medications and follow-up appointment after discharge as well as recommendation to abstain completely from marijuana use after discharge. On day of discharge, patient was evaluated on 07/25/2023 the patient reports that their mood is stable. The patient denied having suicidal thoughts for more than 48 hours prior to discharge.  Patient denies having homicidal thoughts.  Patient denies having auditory hallucinations.  Patient denies any visual hallucinations or other symptoms of psychosis. The patient was motivated to continue taking medication with a goal of continued improvement in mental health.  On day of discharge patient presented futuristic and goal oriented, able to discuss coping skills with stressors and crisis plan after discharge. The patient reports their target psychiatric symptoms of mood instability, mania and psychosis responded well to the psychiatric medications, and the patient reports overall benefit other psychiatric hospitalization. Supportive psychotherapy was provided to the patient. The patient also participated in regular group therapy while hospitalized. Coping skills, problem  solving as well as relaxation therapies were also part of the unit programming.  Labs were reviewed with the patient, and abnormal results  were discussed with the patient.  The patient is able to verbalize their individual safety plan to this provider.  Behavioral Events: None  Restraints: None  Groups: Attended and participated  Medications Changes: As above   Sleep  Good, improved during hospital stay  Musculoskeletal: Strength & Muscle Tone: within normal limits Gait & Station: normal Patient leans: N/A  Psychiatric Specialty Exam  General Appearance: appears at stated age, fairly dressed and groomed  Behavior: pleasant and cooperative  Psychomotor Activity:No psychomotor agitation or retardation noted   Eye Contact: good Speech: normal amount, tone, volume and latency   Mood: euthymic Affect: congruent, pleasant and interactive  Thought Process: linear, goal directed, no circumstantial or tangential thought process noted, no racing thoughts or flight of ideas Descriptions of Associations: intact Thought Content: Hallucinations: denies AH, VH , does not appear responding to stimuli Delusions: No paranoia or other delusions noted Suicidal Thoughts: denies SI, intention, plan  Homicidal Thoughts: denies HI, intention, plan   Alertness/Orientation: alert and fully oriented  Insight: fair, improved Judgment: fair, improved  Memory: intact  Executive Functions  Concentration: intact  Attention Span: Fair Recall: intact Fund of Knowledge: fair   Art therapist  Concentration: intact Attention Span: Fair Recall: intact Fund of Knowledge: fair   Assets  Assets: Desire for Improvement; Resilience   Physical Exam: Physical Exam ROS Blood pressure 136/85, pulse 79, temperature 98 F (36.7 C), temperature source Oral, resp. rate 17, height 5\' 9"  (1.753 m), weight 63 kg, SpO2 99%. Body mass index is 20.53 kg/m.  Mental Status Per Nursing  Assessment::   On Admission:  NA  Demographic Factors:  Male, Adolescent or young adult, Low socioeconomic status, and Unemployed  Loss Factors: NA  Historical Factors: Impulsivity  Risk Reduction Factors:   Responsible for children under 75 years of age, Living with another person, especially a relative, and Positive social support  Continued Clinical Symptoms: Symptoms improved significantly during hospital stay Bipolar Disorder:   Mixed State  Cognitive Features That Contribute To Risk:  None    Suicide Risk:  Minimal: No identifiable suicidal ideation.  Patients presenting with no risk factors but with morbid ruminations; may be classified as minimal risk based on the severity of the depressive symptoms   Follow-up Information     [place] Go on 07/27/2023.   Specialty: Professional Counselor Why: Please go to this provider on 07/27/23 at 9:00 am for an assessment, to receive therapy services.  You may also go on Monday through Friday, from 9 am to 1 pm for an assessment. Contact information: [address]  [address] [place] [address] [phone]         [facility] Follow up on 08/02/2023.   Specialty: Behavioral Health Why: You have an appointment for medication management services on 08/02/23 at 11:00 am. Contact information: [address] [place]  [address] [phone]                Plan Of Care/Follow-up recommendations:   Discharge recommendations:    Activity: as tolerated  Diet: heart healthy  # It is recommended to the patient to continue psychiatric medications as prescribed, after discharge from the hospital.     # It is recommended to the patient to follow up with your outpatient psychiatric provider and PCP.   # It was discussed with the patient, the impact of alcohol, drugs, tobacco have been there overall psychiatric and medical wellbeing, and total abstinence from  substance  use was recommended the patient.ed.   # Prescriptions provided or sent directly to preferred pharmacy at discharge. Patient agreeable to plan. Given opportunity to ask questions. Appears to feel comfortable with discharge.    # In the event of worsening symptoms, the patient is instructed to call the crisis hotline, 911 and or go to the nearest ED for appropriate evaluation and treatment of symptoms. To follow-up with primary care provider for other medical issues, concerns and or health care needs   # Patient was discharged home with a plan to follow up as noted above.   Patient agrees with D/C instructions and plan.  The patient received suicide prevention pamphlet:  Yes Belongings returned:  Clothing and Valuables  Total Time Spent in Direct Patient Care:  I personally spent 45 minutes on the unit in direct patient care. The direct patient care time included face-to-face time with the patient, reviewing the patient's chart, communicating with other professionals, and coordinating care. Greater than 50% of this time was spent in counseling or coordinating care with the patient regarding goals of hospitalization, psycho-education, and discharge planning needs.   Maxamillian Tienda 07/25/2023, 9:09 AM   Poonam Woehrle Linnie Riches, MD 07/25/2023, 9:09 AM

## 2023-07-25 NOTE — Progress Notes (Incomplete)
 Patient did leave his discharge paperwork.  Information is at the nurse's station.  Patient stated someone will pick up his paperwork.

## 2023-07-25 NOTE — Progress Notes (Signed)
  [facility] Adult Case Management Discharge Plan :  Will you be returning to the same living situation after discharge:  Yes,  patient lives with girlfriend.  At discharge, do you have transportation home?: No.CSW has provided patient a taxi voucher Do you have the ability to pay for your medications: Yes,  form of medicaid.   Release of information consent forms completed and in the chart;  Patient's signature needed at discharge.  Patient to Follow up at:  Follow-up Information     [place] Go on 07/27/2023.   Specialty: Professional Counselor Why: Please go to this provider on 07/27/23 at 9:00 am for an assessment, to receive therapy services.  You may also go on Monday through Friday, from 9 am to 1 pm for an assessment. Contact information: [address]  [address] [place] [address] [phone]         [facility] Follow up on 08/02/2023.   Specialty: Behavioral Health Why: You have an appointment for medication management services on 08/02/23 at 11:00 am. Contact information: [address] [place]  [address] [phone]                Next level of care provider has access to [facility] Link:no  Safety Planning and Suicide Prevention discussed: Yes,  previous      Has patient been referred to the Quitline?: Patient refused referral for treatment  Patient has been referred for addiction treatment: Patient refused referral for treatment.  Mariano Shiver, LCSW 07/25/2023, 10:44 AM

## 2023-07-25 NOTE — Group Note (Signed)
 Date:  07/25/2023 Time:  9:20 AM  Group Topic/Focus:  Goals Group:   The focus of this group is to help patients establish daily goals to achieve during treatment and discuss how the patient can incorporate goal setting into their daily lives to aide in recovery. Orientation:   The focus of this group is to educate the patient on the purpose and policies of crisis stabilization and provide a format to answer questions about their admission.  The group details unit policies and expectations of patients while admitted.    Participation Level:  Active  Participation Quality:  Appropriate  Affect:  Appropriate  Cognitive:  Appropriate  Insight: Appropriate  Engagement in Group:  Engaged  Modes of Intervention:  Discussion and Orientation  Additional Comments:    Jurnei Latini D Caedan Sumler 07/25/2023, 9:20 AM

## 2023-07-25 NOTE — Progress Notes (Signed)
 Discharge Note:  Patient discharged with taxi voucher to [facility].  Suicide prevention information given and discussed with  patient who stated he understood and had no questions.  Denied SI and HI.  Denied A/V hallucinations.  Patient stated he had all his belongings, clothing, toiletries, misc items, etc.  Patient stated he appreciated all assistance received from [facility] staff.

## 2023-07-25 NOTE — Discharge Summary (Signed)
 Physician Discharge Summary Note  Patient:  Jay Woods is an 23 y.o., male MRN:  [id number] DOB:  31-Mar-2000 Patient phone:  [phone] (home)  Patient address:   [address] [place] [facility] [address],  Total Time spent with patient: 45 minutes  Date of Admission:  07/10/2023 Date of Discharge: 07/25/2023  Reason for Admission:  Jay Woods is a 23 yr old male who presented on 5/17 to [facility] via law enforcement under IVC for erratic aggressive behavior towards his family, he was admitted to [facility] on 5/18. PPHx is significant for Schizoaffective Disorder, GAD, IED, Polysubstance Abuse (Benzo, Meth, THC), and 4 Prior Psychiatric Hospitalizations (last- [name]/20[facility]), and no history of Suicide Attempts or Self Injurious Behavior.   Principal Problem: Schizoaffective disorder, bipolar type [facility]) Discharge Diagnoses: Principal Problem:   Schizoaffective disorder, bipolar type (HCC)   Past Psychiatric History:  He reports a past psychiatric history significant for Schizoaffective Disorder, GAD, IED, Polysubstance Abuse (Benzo, Meth, THC).  He reports no history of suicide attempts.  He reports no history of self-injurious behavior.  He reports 4 prior psychiatric hospitalizations-last [name]/20[facility].  He reports no significant past medical history.  He reports no significant past surgical history.  He reports no history of head trauma.  He reports no history of seizures.  He reports NKDA.   Past Medical History:  Past Medical History:  Diagnosis Date   Eczema    Schizoaffective disorder (HCC)    History reviewed. No pertinent surgical history.  Family History:  Family History  Problem Relation Age of Onset   Healthy Mother    Healthy Father    Family Psychiatric  History:  He reports possible family diagnosis' Reports No Known Suicides or Substance Abuse Social History:  Social History   Substance and Sexual Activity  Alcohol Use Not Currently    Alcohol/week: 1.0 standard drink of alcohol   Types: 1 Cans of beer per week   Comment: last use 09/30/21     Social History   Substance and Sexual Activity  Drug Use Not Currently   Comment: Hx of cocaine, marijuana, k2, Xanax, and Molly on and off for the past 5 years    Social History   Socioeconomic History   Marital status: Single    Spouse name: Not on file   Number of children: Not on file   Years of education: Not on file   Highest education level: Not on file  Occupational History   Not on file  Tobacco Use   Smoking status: Former    Current packs/day: 0.25    Average packs/day: 0.3 packs/day for 5.0 years (1.3 ttl pk-yrs)    Types: Cigarettes   Smokeless tobacco: Former  Building services engineer status: Every Day   Substances: CBD  Substance and Sexual Activity   Alcohol use: Not Currently    Alcohol/week: 1.0 standard drink of alcohol    Types: 1 Cans of beer per week    Comment: last use 09/30/21   Drug use: Not Currently    Comment: Hx of cocaine, marijuana, k2, Xanax, and Molly on and off for the past 5 years   Sexual activity: Yes    Partners: Female    Birth control/protection: None  Other Topics Concern   Not on file  Social History Narrative   Not on file   Social Drivers of Health   Financial Resource Strain: High Risk (10/06/2021)   Overall Financial Resource Strain (CARDIA)  Difficulty of Paying Living Expenses: Very hard  Food Insecurity: Food Insecurity Present (07/10/2023)   Hunger Vital Sign    Worried About Running Out of Food in the Last Year: Sometimes true    Ran Out of Food in the Last Year: Sometimes true  Transportation Needs: Unmet Transportation Needs (07/10/2023)   PRAPARE - Transportation    Lack of Transportation (Medical): Yes    Lack of Transportation (Non-Medical): Yes  Physical Activity: Sufficiently Active (10/06/2021)   Exercise Vital Sign    Days of Exercise per Week: 7 days    Minutes of Exercise per Session: 30 min   Stress: No Stress Concern Present (10/06/2021)   [employer] of Occupational Health - Occupational Stress Questionnaire    Feeling of Stress : Only a little  Social Connections: Socially Integrated (10/06/2021)   Social Connection and Isolation Panel [NHANES]    Frequency of Communication with Friends and Family: More than three times a week    Frequency of Social Gatherings with Friends and Family: More than three times a week    Attends Religious Services: More than 4 times per year    Active Member of [employer] or Organizations: No    Attends Engineer, structural: More than 4 times per year    Marital Status: Living with partner   Marital status: Long term relationship Long term relationship, how long?: "I am not sure" What types of issues is patient dealing with in the relationship?: "Miscommunication" Additional relationship information: Not assessed Are you sexually active?: Yes What is your sexual orientation?: Heterosexual Has your sexual activity been affected by drugs, alcohol, medication, or emotional stress?: No Does patient have children?: No  Substance Use History:   Comment: Hx of cocaine, marijuana, k2, Xanax, and Molly on and off for the past 5 years  THC on and off recently  Hospital Course:   During the patient's hospitalization, patient had extensive initial psychiatric evaluation, and follow-up psychiatric evaluations every day.   Psychiatric diagnoses provided upon initial assessment: Schizoaffective disorder bipolar type   Patient's psychiatric medications were adjusted on admission: Patient was started on risperidone  and Depakote  at another facility prior to admission here, at time of admission to this facility risperidone  was increased to 1 mg twice daily and Depakote  ER was continued 500 mg daily for mood stability, he was started on Ativan  1 mg twice daily for mood stabilization for 2 days   During the hospitalization, other adjustments were made  to the patient's psychiatric medication regimen: Depakote  ER was titrated up gradually to 2000 mg daily, risperidone  dose was titrated up gradually to 3 mg twice daily, Ativan  was discontinued later during hospital stay.  Patient was offered LAI but he declined. Depakote  level was obtained the day prior to discharge, was therapeutic at 89 Patient's care was discussed during the interdisciplinary team meeting every day during the hospitalization.   The patient denied having side effects to prescribed psychiatric medication.   Gradually, patient started adjusting to milieu. The patient was evaluated each day by a clinical provider to ascertain response to treatment. Improvement was noted by the patient's report of decreasing symptoms, improved sleep and appetite, affect, medication tolerance, behavior, and participation in unit programming.  Patient was asked each day to complete a self inventory noting mood, mental status, pain, new symptoms, anxiety and concerns.     Symptoms were reported as significantly decreased or resolved completely by discharge.  Patient's family was contacted today prior to discharge, patient's girlfriend confirms  that patient is currently at baseline and denied any imminent concern about patient harming self or others.  Patient and family was counseled regarding need to comply with medications and follow-up appointment after discharge as well as recommendation to abstain completely from marijuana use after discharge. On day of discharge, patient was evaluated on 07/25/2023 the patient reports that their mood is stable. The patient denied having suicidal thoughts for more than 48 hours prior to discharge.  Patient denies having homicidal thoughts.  Patient denies having auditory hallucinations.  Patient denies any visual hallucinations or other symptoms of psychosis. The patient was motivated to continue taking medication with a goal of continued improvement in mental health.  On  day of discharge patient presented futuristic and goal oriented, able to discuss coping skills with stressors and crisis plan after discharge. The patient reports their target psychiatric symptoms of mood instability, mania and psychosis responded well to the psychiatric medications, and the patient reports overall benefit other psychiatric hospitalization. Supportive psychotherapy was provided to the patient. The patient also participated in regular group therapy while hospitalized. Coping skills, problem solving as well as relaxation therapies were also part of the unit programming.   Labs were reviewed with the patient, and abnormal results were discussed with the patient.   The patient is able to verbalize their individual safety plan to this provider.   Behavioral Events: None   Restraints: None   Groups: Attended and participated   Medications Changes: As above     Sleep  Good, improved during hospital stay    Physical Findings: AIMS: Facial and Oral Movements Muscles of Facial Expression: None Lips and Perioral Area: None Jaw: None Tongue: None,Extremity Movements Upper (arms, wrists, hands, fingers): None Lower (legs, knees, ankles, toes): None, Trunk Movements Neck, shoulders, hips: None, Global Judgements Severity of abnormal movements overall : None Incapacitation due to abnormal movements: None Patient's awareness of abnormal movements: No Awareness, Dental Status Current problems with teeth and/or dentures?: No Does patient usually wear dentures?: No Edentia?: No  CIWA:    COWS:     Musculoskeletal: Strength & Muscle Tone: within normal limits Gait & Station: normal Patient leans: N/A   Psychiatric Specialty Exam:  General Appearance: appears at stated age, fairly dressed and groomed  Behavior: pleasant and cooperative  Psychomotor Activity:No psychomotor agitation or retardation noted   Eye Contact: good Speech: normal amount, tone, volume and  latency   Mood: euthymic Affect: congruent, pleasant and interactive  Thought Process: linear, goal directed, no circumstantial or tangential thought process noted, no racing thoughts or flight of ideas Descriptions of Associations: intact Thought Content: Hallucinations: denies AH, VH , does not appear responding to stimuli Delusions: No paranoia or other delusions noted Suicidal Thoughts: denies SI, intention, plan  Homicidal Thoughts: denies HI, intention, plan   Alertness/Orientation: alert and fully oriented  Insight: fair, improved Judgment: fair, improved  Memory: intact  Executive Functions  Concentration: intact  Attention Span: Fair Recall: intact Fund of Knowledge: fair   Assets  Assets: Desire for Improvement; Resilience     Physical Exam:  Physical Exam Vitals and nursing note reviewed.  Constitutional:      Appearance: Normal appearance. He is normal weight.  HENT:     Head: Normocephalic and atraumatic.     Nose: Nose normal.  Eyes:     Extraocular Movements: Extraocular movements intact.  Pulmonary:     Effort: Pulmonary effort is normal.  Musculoskeletal:        General: Normal  range of motion.     Cervical back: Normal range of motion.  Neurological:     General: No focal deficit present.     Mental Status: He is alert and oriented to person, place, and time. Mental status is at baseline.  Psychiatric:        Mood and Affect: Mood normal.        Behavior: Behavior normal.        Thought Content: Thought content normal.        Judgment: Judgment normal.    Review of Systems  All other systems reviewed and are negative.  Blood pressure 136/85, pulse 79, temperature 98 F (36.7 C), temperature source Oral, resp. rate 17, height 5\' 9"  (1.753 m), weight 63 kg, SpO2 99%. Body mass index is 20.53 kg/m.   Social History   Tobacco Use  Smoking Status Former   Current packs/day: 0.25   Average packs/day: 0.3 packs/day for 5.0 years (1.3  ttl pk-yrs)   Types: Cigarettes  Smokeless Tobacco Former   Tobacco Cessation:  A prescription for an FDA-approved tobacco cessation medication was offered at discharge and the patient refused   Blood Alcohol level:  Lab Results  Component Value Date   [facility] <15 07/10/2023   ETH <10 09/24/2021    Metabolic Disorder Labs:  Lab Results  Component Value Date   HGBA1C 5.0 07/12/2023   MPG 96.8 07/12/2023   MPG 108.28 09/24/2021   Lab Results  Component Value Date   PROLACTIN 4.4 06/09/2020   Lab Results  Component Value Date   CHOL 126 07/12/2023   TRIG 48 07/12/2023   HDL 44 07/12/2023   CHOLHDL 2.9 07/12/2023   VLDL 10 07/12/2023   LDLCALC 72 07/12/2023   LDLCALC 81 09/24/2021    See Psychiatric Specialty Exam and Suicide Risk Assessment completed by Attending Physician prior to discharge.  Discharge destination:  Home with girlfriend and mother.  Is patient on multiple antipsychotic therapies at discharge:  No   Has Patient had three or more failed trials of antipsychotic monotherapy by history:  No  Recommended Plan for Multiple Antipsychotic Therapies: NA  Discharge Instructions     Diet - low sodium heart healthy   Complete by: As directed    Increase activity slowly   Complete by: As directed       Allergies as of 07/25/2023   No Known Allergies      Medication List     TAKE these medications      Indication  divalproex  500 MG 24 hr tablet Commonly known as: DEPAKOTE  ER Take 4 tablets (2,000 mg total) by mouth daily at 8 pm.  Indication: MIXED BIPOLAR AFFECTIVE DISORDER   hydrOXYzine  25 MG tablet Commonly known as: ATARAX  Take 1 tablet (25 mg total) by mouth 3 (three) times daily as needed for anxiety.  Indication: Feeling Anxious   risperiDONE  2 MG tablet Commonly known as: RisperDAL  Take 1.5 tablets (3 mg total) by mouth 2 (two) times daily.  Indication: MIXED BIPOLAR AFFECTIVE DISORDER   traZODone  50 MG tablet Commonly known as:  DESYREL  Take 1 tablet (50 mg total) by mouth at bedtime as needed for sleep.  Indication: Trouble Sleeping        Follow-up Information     [facility] Go on 07/27/2023.   Specialty: Professional Counselor Why: Please go to this provider on 07/27/23 at 9:00 am for an assessment, to receive therapy services.  You may also go on Monday  through Friday, from 9 am to 1 pm for an assessment. Contact information: [address]  [address] [place] [address] [phone]         [facility] Follow up on 08/02/2023.   Specialty: Behavioral Health Why: You have an appointment for medication management services on 08/02/23 at 11:00 am. Contact information: [address] [place]  [address] [phone]                Discharge recommendations:   Activity: as tolerated  Diet: heart healthy  # It is recommended to the patient to continue psychiatric medications as prescribed, after discharge from the hospital.     # It is recommended to the patient to follow up with your outpatient psychiatric provider and PCP.   # It was discussed with the patient, the impact of alcohol, drugs, tobacco have been there overall psychiatric and medical wellbeing, and total abstinence from substance use was recommended the patient.ed.   # Prescriptions provided or sent directly to preferred pharmacy at discharge. Patient agreeable to plan. Given opportunity to ask questions. Appears to feel comfortable with discharge.    # In the event of worsening symptoms, the patient is instructed to call the crisis hotline, 911 and or go to the nearest ED for appropriate evaluation and treatment of symptoms. To follow-up with primary care provider for other medical issues, concerns and or health care needs   # Patient was discharged home with a plan to follow up as noted above.   Patient agrees with D/C instructions and plan.   The patient received  suicide prevention pamphlet:  Yes Belongings returned:  Clothing and Valuables  Total Time Spent in Direct Patient Care:  I personally spent 45 minutes on the unit in direct patient care. The direct patient care time included face-to-face time with the patient, reviewing the patient's chart, communicating with other professionals, and coordinating care. Greater than 50% of this time was spent in counseling or coordinating care with the patient regarding goals of hospitalization, psycho-education, and discharge planning needs.    SignedAlver Jobs, MD 07/25/2023, 9:16 AM

## 2023-07-25 NOTE — Progress Notes (Signed)
 D:  Patient  sleeps good, sleep med helpful.  Good appetite, normal energy level, good concentration.  Denied depression, hopeless, anxiety.  Withdrawals, THC.  Denied SI.  Denied physical problems.  Denied physical pain.  Goal is have faith and hope.  Plans to pray.  God bless you.  Does have discharge plans.  Must do paper work. A:  Medications administered per MD orders.  Emotional support and encouragement given patient. R:  Safety maintained with 15 minute checks.  Denied SI and HI, contracts for safety.  Denied A/V hallucinations.

## 2023-07-25 NOTE — Plan of Care (Signed)
   Problem: Education: Goal: Emotional status will improve Outcome: Progressing   Problem: Activity: Goal: Interest or engagement in activities will improve Outcome: Progressing

## 2023-08-02 ENCOUNTER — Encounter (HOSPITAL_COMMUNITY): Payer: Self-pay

## 2023-08-02 ENCOUNTER — Ambulatory Visit (HOSPITAL_COMMUNITY): Payer: MEDICAID | Admitting: Psychiatry

## 2023-08-02 ENCOUNTER — Encounter (HOSPITAL_COMMUNITY): Payer: Self-pay | Admitting: Psychiatry

## 2023-08-02 DIAGNOSIS — R079 Chest pain, unspecified: Secondary | ICD-10-CM | POA: Diagnosis not present

## 2023-08-02 DIAGNOSIS — F250 Schizoaffective disorder, bipolar type: Secondary | ICD-10-CM

## 2023-08-02 DIAGNOSIS — Z0000 Encounter for general adult medical examination without abnormal findings: Secondary | ICD-10-CM

## 2023-08-02 MED ORDER — DIVALPROEX SODIUM ER 500 MG PO TB24
2000.0000 mg | ORAL_TABLET | Freq: Every day | ORAL | 3 refills | Status: DC
Start: 2023-08-02 — End: 2023-11-06

## 2023-08-02 MED ORDER — RISPERIDONE 3 MG PO TABS
6.0000 mg | ORAL_TABLET | Freq: Every day | ORAL | 3 refills | Status: DC
Start: 2023-08-02 — End: 2023-11-06

## 2023-08-02 MED ORDER — HYDROXYZINE HCL 25 MG PO TABS: 25.0000 mg | ORAL_TABLET | Freq: Three times a day (TID) | ORAL | 3 refills | Status: DC | PRN

## 2023-08-02 MED ORDER — TRAZODONE HCL 50 MG PO TABS: 50.0000 mg | ORAL_TABLET | Freq: Every evening | ORAL | 3 refills | Status: DC | PRN

## 2023-08-02 NOTE — Progress Notes (Signed)
 Psychiatric Initial Adult Assessment   Virtual Visit via Video Note  I connected with Jay Woods on 08/02/23 at  4:00 PM EDT by a video enabled telemedicine application and verified that I am speaking with the correct person using two identifiers.  Location: Patient: Home Provider: Clinic   I discussed the limitations of evaluation and management by telemedicine and the availability of in person appointments. The patient expressed understanding and agreed to proceed.  I provided 45 minutes of non-face-to-face time during this encounter.    Patient Identification: Jay Woods MRN:  [id number] Date of Evaluation:  08/02/2023 Referral Source: [facility] Chief Complaint:  "I feel better" Per sister "He is tired in the day"  Visit Diagnosis:    ICD-10-CM   1. Schizoaffective disorder, bipolar type (HCC)  F25.0 divalproex  (DEPAKOTE  ER) 500 MG 24 hr tablet    hydrOXYzine  (ATARAX ) 25 MG tablet    risperiDONE  (RISPERDAL ) 3 MG tablet    traZODone  (DESYREL ) 50 MG tablet      History of Present Illness:  23 year old male seen today for initial psychiatric evaluation. He was referred to outpatient psychiatry by [facility] where her presented on 07/10/2023-07/25/2023. Per chart review patient was IVC'd for erratic aggressive behavior towards his family. He has a psychiatric history of Schizoaffective Disorder, GAD, IED, Polysubstance Abuse (Benzo, Meth, THC), and 4 Prior Psychiatric Hospitalizations (last- [name] 07/2022), and no history of Suicide Attempts or Self Injurious Behavior.  Today he reports his medications are effective in managing his psychiatric conditions.  Today he is well-groomed, pleasant, cooperative, and engaged in conversation.  Patient reports that he feels well since his hospitalization.  He notes that he is less aggressive and denies symptoms of anxiety and depression.  Today provider conducted a GAD-7 and patient scored a 0.  Provider also conducted PHQ-9 the patient  scored a 0.  He endorsed having adequate appetite.  Today he denies SI/HI/VH, mania, paranoia.  Patient was seen with his sister who notes that she is concerned as his sleep noting that it fluctuates (4-9 hours).  She also notes that he is fatigued during the day.  Provider recommended taking Risperdal  6 mg nightly instead of 3 mg twice daily to help manage sleep and to prevent daytime sedation.  He endorsed understanding and agreed.  Patient reports that he is engaged.  He notes that he is nervous about marriage but is hopeful for the future.  He informed Clinical research associate that he has 5 children.  He informed Clinical research associate that his children are doing well.  Patient informed writer that he occasionally vapes tobacco as well as THC.  Provider informed patient that [facility] can exacerbate his mental health.  He endorsed understanding and notes that he will try to cut back.  He also notes that he drinks alcohol socially.  Patient notes that he has intermittent chest pain.  He informed Clinical research associate that currently he does not a PCP.  Provider agreeable to referred patient to PCP.  Provider also recommend patient see a cardiologist.  He was agreeable.  Today patient agreeable to taking Risperdal  6 mg nightly instead of 3 mg twice daily.  He will continue other medications as prescribed.  Patient was referred to [facility] for primary care.  He was also referred to [facility] health cardiology for chest pain.  No other concerns noted at this time.  Associated Signs/Symptoms: Depression Symptoms:  disturbed sleep, (Hypo) Manic Symptoms:  Denies Anxiety Symptoms:  Mild anxiety about getting married Psychotic Symptoms:  Denies PTSD Symptoms: NA  Past Psychiatric History:Schizoaffective Disorder, GAD, IED, Polysubstance Abuse (Benzo, Meth, THC), and 4 Prior Psychiatric Hospitalizations (last- [facility] 07/2022), and no history of Suicide Attempts or Self Injurious Behavior  Previous Psychotropic Medications: Depakote ,  hydroxyzine , Risperdal , trazodone   Substance Abuse History in the last 12 months:  Yes.    Consequences of Substance Abuse: Medical Consequences:  4 Prior Psychiatric Hospitalizations (last- [name]/2022)  Past Medical History:  Past Medical History:  Diagnosis Date   Eczema    Schizoaffective disorder (HCC)    No past surgical history on file.  Family Psychiatric History: Denies  Family History:  Family History  Problem Relation Age of Onset   Healthy Mother    Healthy Father     Social History:   Social History   Socioeconomic History   Marital status: Single    Spouse name: Not on file   Number of children: Not on file   Years of education: Not on file   Highest education level: Not on file  Occupational History   Not on file  Tobacco Use   Smoking status: Former    Current packs/day: 0.25    Average packs/day: 0.3 packs/day for 5.0 years (1.3 ttl pk-yrs)    Types: Cigarettes   Smokeless tobacco: Former  Building services engineer status: Every Day   Substances: CBD  Substance and Sexual Activity   Alcohol use: Not Currently    Alcohol/week: 1.0 standard drink of alcohol    Types: 1 Cans of beer per week    Comment: last use 09/30/21   Drug use: Not Currently    Comment: Hx of cocaine, marijuana, k2, Xanax, and Molly on and off for the past 5 years   Sexual activity: Yes    Partners: Female    Birth control/protection: None  Other Topics Concern   Not on file  Social History Narrative   Not on file   Social Drivers of Health   Financial Resource Strain: High Risk (10/06/2021)   Overall Financial Resource Strain (CARDIA)    Difficulty of Paying Living Expenses: Very hard  Food Insecurity: Food Insecurity Present (07/10/2023)   Hunger Vital Sign    Worried About Running Out of Food in the Last Year: Sometimes true    Ran Out of Food in the Last Year: Sometimes true  Transportation Needs: Unmet Transportation Needs (07/10/2023)   PRAPARE - Transportation     Lack of Transportation (Medical): Yes    Lack of Transportation (Non-Medical): Yes  Physical Activity: Sufficiently Active (10/06/2021)   Exercise Vital Sign    Days of Exercise per Week: 7 days    Minutes of Exercise per Session: 30 min  Stress: No Stress Concern Present (10/06/2021)   [employer] of Occupational Health - Occupational Stress Questionnaire    Feeling of Stress : Only a little  Social Connections: Socially Integrated (10/06/2021)   Social Connection and Isolation Panel [NHANES]    Frequency of Communication with Friends and Family: More than three times a week    Frequency of Social Gatherings with Friends and Family: More than three times a week    Attends Religious Services: More than 4 times per year    Active Member of [employer] or Organizations: No    Attends Engineer, structural: More than 4 times per year    Marital Status: Living with partner    Additional Social History: Patient resides in [place] with his mother and 2 sisters (  23, 13).  He reports he is currently unemployed.  He reports that he completed the 10th grade.  Notes that he drinks alcohol socially.  He reports that he smokes some cigarettes but also vapes THC.   Allergies:  No Known Allergies  Metabolic Disorder Labs: Lab Results  Component Value Date   HGBA1C 5.0 07/12/2023   MPG 96.8 07/12/2023   MPG 108.28 09/24/2021   Lab Results  Component Value Date   PROLACTIN 4.4 06/09/2020   Lab Results  Component Value Date   CHOL 126 07/12/2023   TRIG 48 07/12/2023   HDL 44 07/12/2023   CHOLHDL 2.9 07/12/2023   VLDL 10 07/12/2023   LDLCALC 72 07/12/2023   LDLCALC 81 09/24/2021   Lab Results  Component Value Date   TSH 1.072 07/12/2023    Therapeutic Level Labs: No results found for: "LITHIUM" No results found for: "CBMZ" Lab Results  Component Value Date   VALPROATE 89 07/24/2023    Current Medications: Current Outpatient Medications  Medication Sig Dispense  Refill   divalproex  (DEPAKOTE  ER) 500 MG 24 hr tablet Take 4 tablets (2,000 mg total) by mouth daily at 8 pm. 120 tablet 3   hydrOXYzine  (ATARAX ) 25 MG tablet Take 1 tablet (25 mg total) by mouth 3 (three) times daily as needed for anxiety. 30 tablet 3   risperiDONE  (RISPERDAL ) 3 MG tablet Take 2 tablets (6 mg total) by mouth at bedtime. 60 tablet 3   traZODone  (DESYREL ) 50 MG tablet Take 1 tablet (50 mg total) by mouth at bedtime as needed for sleep. 30 tablet 3   No current facility-administered medications for this visit.    Musculoskeletal: Strength & Muscle Tone: within normal limits and Telehealth vsit Gait & Station: normal, Telehealth visit Patient leans: N/A  Psychiatric Specialty Exam: Review of Systems  There were no vitals taken for this visit.There is no height or weight on file to calculate BMI.  General Appearance: Well Groomed  Eye Contact:  Good  Speech:  Clear and Coherent and Normal Rate  Volume:  Normal  Mood:  Euthymic  Affect:  Appropriate and Congruent  Thought Process:  Coherent, Goal Directed, and Linear  Orientation:  Full (Time, Place, and Person)  Thought Content:  WDL and Logical  Suicidal Thoughts:  No  Homicidal Thoughts:  No  Memory:  Immediate;   Good Recent;   Good Remote;   Good  Judgement:  Good  Insight:  Good  Psychomotor Activity:  Normal  Concentration:  Concentration: Good and Attention Span: Good  Recall:  Good  Fund of Knowledge:Good  Language: Good  Akathisia:  No  Handed:  Right  AIMS (if indicated):  not done  Assets:  Communication Skills Desire for Improvement Financial Resources/Insurance Housing Intimacy Leisure Time Physical Health Social Support  ADL's:  Intact  Cognition: WNL  Sleep:  Fair   Screenings: AIMS    Flowsheet Row [facility] (Discharged) [facility] Ad[facility]  (Discharged) [facility] Ad[facility] (Discharged) [facility] Ad[facility] (Discharged) [facility]  AIMS Total Score 0 0 0 0 0      AUDIT    Flowsheet Row [facility]) [facility] Ad[facility] (Discharged) [facility] Ad[facility] (Discharged)  [facility] Ad[facility] (Discharged) [facility] Ad[facility] (Discharged) [facility]  Alcohol Use Disorder Identification Test Final Score (AUDIT) 3 7 3 6  0      GAD-7    Flowsheet Row Office Visit from 10/06/2021 in [facility] Most recent reading at 10/06/2021 10:13 AM Counselor from 10/06/2021 in [facility] Most recent reading at 10/06/2021  8:37 AM  Total GAD-7 Score 0 12      PHQ2-9    Flowsheet Row Office Visit from 08/02/2023 in [facility] Most recent reading at 08/02/2023 11:53 AM Office Visit from 10/06/2021 in [facility] Most recent reading at 10/06/2021 10:12 AM Counselor from 10/06/2021 in [facility] Most recent reading at 10/06/2021  8:34 AM  PHQ-2 Total Score 0 0 4  PHQ-9 Total Score 0 0 12      Flowsheet Row [facility]) [facility] Most recent reading at 07/10/2023  3:00 PM ED from 07/10/2023 in [facility] Emergency Department at [facility] Most recent reading at 07/10/2023  2:46 AM UC from 03/22/2023 in [facility] [place] Most recent reading at 03/22/2023  6:12 PM  C-SSRS RISK CATEGORY No Risk No  Risk No Risk       Assessment and Plan: Patient reports that his mood, anxiety, depression has improved since his last visit.  He denies symptoms of psychosis.  Patient's sister however notes that the patient's sleep fluctuates.  She informed Clinical research associate that he is sedated in the day.  Provider recommended taking Risperdal  6 mg nightly instead of 3 mg twice a day.  Patient endorsed understanding and agreed.  He will continue other medication as prescribed.  Patient complains of intermittent chest pain.Patient was referred to [facility] for primary care.  He was also referred to [facility] health cardiology for chest pain.  1. Schizoaffective disorder, bipolar type (HCC) (Primary)  Continue- divalproex  (DEPAKOTE  ER) 500 MG 24 hr tablet; Take 4 tablets (2,000 mg total) by mouth daily at 8 pm.  Dispense: 120 tablet; Refill: 3 Continue- hydrOXYzine  (ATARAX ) 25 MG tablet; Take 1 tablet (25 mg total) by mouth 3 (three) times daily as needed for anxiety.  Dispense: 30 tablet; Refill: 3 Take nightly- risperiDONE  (RISPERDAL ) 3 MG tablet; Take 2 tablets (6 mg total) by mouth at bedtime.  Dispense: 60 tablet; Refill: 3 Continue- traZODone  (DESYREL ) 50 MG tablet; Take 1 tablet (50 mg total) by mouth at bedtime as needed for sleep.  Dispense: 30 tablet; Refill: 3  2. Chest pain, unspecified type  - Ambulatory referral to Cardiology  3. Adult wellness visit  - Ambulatory referral to Internal Medicine - Ambulatory referral to Cardiology    Collaboration of Care: Other provider involved in patient's care AEB PCP and primary psychiatric provider  Patient/Guardian was advised Release of Information must be obtained prior to any record release in order to collaborate their care with an outside provider. Patient/Guardian was advised if they have not already done so to contact the registration department to sign all necessary forms in order for us  to release information regarding their care.   Consent:  Patient/Guardian gives verbal consent for treatment and assignment of benefits for services provided during this visit. Patient/Guardian expressed understanding and agreed to proceed.   Gracee Ratterree E  Melisa Spray, NP 6/9/202512:18 PM

## 2023-09-28 ENCOUNTER — Other Ambulatory Visit: Payer: Self-pay

## 2023-09-28 ENCOUNTER — Emergency Department (HOSPITAL_COMMUNITY)
Admission: EM | Admit: 2023-09-28 | Discharge: 2023-09-28 | Disposition: A | Discharge status: Home / Self Care | Payer: MEDICAID | Attending: Emergency Medicine | Admitting: Emergency Medicine

## 2023-09-28 DIAGNOSIS — R002 Palpitations: Secondary | ICD-10-CM | POA: Diagnosis not present

## 2023-09-28 DIAGNOSIS — R109 Unspecified abdominal pain: Secondary | ICD-10-CM | POA: Diagnosis present

## 2023-09-28 DIAGNOSIS — R1084 Generalized abdominal pain: Secondary | ICD-10-CM | POA: Insufficient documentation

## 2023-09-28 LAB — URINALYSIS, ROUTINE W REFLEX MICROSCOPIC
Bacteria, UA: NONE SEEN
Bilirubin Urine: NEGATIVE
Glucose, UA: NEGATIVE mg/dL
Hgb urine dipstick: NEGATIVE
Ketones, ur: 20 mg/dL — AB
Leukocytes,Ua: NEGATIVE
Nitrite: NEGATIVE
Protein, ur: 30 mg/dL — AB
Specific Gravity, Urine: 1.032 — ABNORMAL HIGH (ref 1.005–1.030)
pH: 5 (ref 5.0–8.0)

## 2023-09-28 LAB — COMPREHENSIVE METABOLIC PANEL WITH GFR
ALT: 15 U/L (ref 0–44)
AST: 20 U/L (ref 15–41)
Albumin: 4.6 g/dL (ref 3.5–5.0)
Alkaline Phosphatase: 40 U/L (ref 38–126)
Anion gap: 13 (ref 5–15)
BUN: 11 mg/dL (ref 6–20)
CO2: 23 mmol/L (ref 22–32)
Calcium: 9.2 mg/dL (ref 8.9–10.3)
Chloride: 102 mmol/L (ref 98–111)
Creatinine, Ser: 0.98 mg/dL (ref 0.61–1.24)
GFR, Estimated: >60 mL/min (ref >60)
Glucose, Bld: 92 mg/dL (ref 70–99)
Potassium: 3.6 mmol/L (ref 3.5–5.1)
Sodium: 138 mmol/L (ref 135–145)
Total Bilirubin: 1.3 mg/dL — ABNORMAL HIGH (ref 0.0–1.2)
Total Protein: 8.6 g/dL — ABNORMAL HIGH (ref 6.5–8.1)

## 2023-09-28 LAB — RAPID URINE DRUG SCREEN, HOSP PERFORMED
Amphetamines: NOT DETECTED
Barbiturates: NOT DETECTED
Benzodiazepines: NOT DETECTED
Cocaine: NOT DETECTED
Opiates: NOT DETECTED
Tetrahydrocannabinol: POSITIVE — AB

## 2023-09-28 LAB — CBC WITH DIFFERENTIAL/PLATELET
Abs Immature Granulocytes: 0.02 K/uL (ref 0.00–0.07)
Basophils Absolute: 0.1 K/uL (ref 0.0–0.1)
Basophils Relative: 1 %
Eosinophils Absolute: 0 K/uL (ref 0.0–0.5)
Eosinophils Relative: 0 %
HCT: 48.6 % (ref 39.0–52.0)
Hemoglobin: 16.1 g/dL (ref 13.0–17.0)
Immature Granulocytes: 0 %
Lymphocytes Relative: 16 %
Lymphs Abs: 1.4 K/uL (ref 0.7–4.0)
MCH: 30.3 pg (ref 26.0–34.0)
MCHC: 33.1 g/dL (ref 30.0–36.0)
MCV: 91.4 fL (ref 80.0–100.0)
Monocytes Absolute: 0.7 K/uL (ref 0.1–1.0)
Monocytes Relative: 8 %
Neutro Abs: 6.5 K/uL (ref 1.7–7.7)
Neutrophils Relative %: 75 %
Platelets: 252 K/uL (ref 150–400)
RBC: 5.32 MIL/uL (ref 4.22–5.81)
RDW: 13.1 % (ref 11.5–15.5)
WBC: 8.7 K/uL (ref 4.0–10.5)
nRBC: 0 % (ref 0.0–0.2)

## 2023-09-28 LAB — TROPONIN I (HIGH SENSITIVITY): Troponin I (High Sensitivity): 3 ng/L (ref <18)

## 2023-09-28 LAB — LIPASE, BLOOD: Lipase: 34 U/L (ref 11–51)

## 2023-09-28 MED ORDER — ONDANSETRON HCL 4 MG PO TABS: 4.0000 mg | ORAL_TABLET | Freq: Four times a day (QID) | ORAL | 0 refills | Status: DC

## 2023-09-28 NOTE — ED Provider Notes (Signed)
[place] EMERGENCY DEPARTMENT AT [facility] Provider Note   CSN: [id number] Arrival date & time: 09/28/23  1109     Patient presents with: Mental Health Problem   Jay Woods is a 23 y.o. male.  Mental Health Problem Associated symptoms: abdominal pain    Patient is a 23 year old male presenting to the ED today for multiple concerns.  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  Reporting nausea and vomiting yesterday.  States at the time that he had at least 7-10 drinks of unknown amounts.  Also reports THC use, last use today.  Has been able to tolerate p.o. fluids and food without difficulty.  No nausea at this time.   Denies fever, headache, vision changes, shortness of breath, hemoptysis, cough, congestion, hematemesis, diarrhea, hematochezia, melena, dysuria, penile discharge, rashes, lower leg swelling.    Prior to Admission medications   Medication Sig Start Date End Date Taking? Authorizing Provider  ondansetron  (ZOFRAN ) 4 MG tablet Take 1 tablet (4 mg total) by mouth every 6 (six) hours. 09/28/23  Yes Fernande Treiber S, PA-C  divalproex  (DEPAKOTE  ER) 500 MG 24 hr tablet Take 4 tablets (2,000 mg total) by mouth daily at 8 pm. 08/02/23   Harl Zane BRAVO, NP  hydrOXYzine  (ATARAX ) 25 MG tablet Take 1 tablet (25 mg total) by mouth 3 (three) times daily as needed for anxiety. 08/02/23   Harl Zane BRAVO, NP  risperiDONE  (RISPERDAL ) 3 MG tablet Take 2 tablets (6 mg total) by mouth at bedtime. 08/02/23   Harl Zane BRAVO, NP  traZODone  (DESYREL ) 50 MG tablet Take 1 tablet (50 mg total) by mouth at bedtime as needed for sleep. 08/02/23   Harl Zane BRAVO, NP    Allergies: Patient has no known allergies.    Review of Systems  Cardiovascular:  Positive for  palpitations.  Gastrointestinal:  Positive for abdominal pain, nausea and vomiting.  All other systems reviewed and are negative.   Updated Vital Signs BP (!) 159/72 (BP Location: Right Arm)   Pulse 78   Temp 97.7 F (36.5 C) (Oral)   Resp 16   Ht 5' 9 (1.753 m)   Wt 79.4 kg   SpO2 100%   BMI 25.84 kg/m   Physical Exam Vitals and nursing note reviewed.  Constitutional:      General: He is not in acute distress.    Appearance: Normal appearance. He is not ill-appearing or diaphoretic.  HENT:     Head: Normocephalic and atraumatic.  Eyes:     General: No scleral icterus.       Right eye: No discharge.        Left eye: No discharge.     Extraocular Movements: Extraocular movements intact.     Conjunctiva/sclera: Conjunctivae normal.  Cardiovascular:     Rate and Rhythm: Normal rate and regular rhythm.     Pulses: Normal pulses.     Heart sounds: Normal heart sounds. No murmur heard.    No friction rub. No gallop.  Pulmonary:     Effort: Pulmonary effort is normal. No respiratory distress.     Breath sounds: No stridor. No wheezing, rhonchi or rales.  Chest:     Chest wall: No tenderness.  Abdominal:     General: Abdomen is  flat. There is no distension.     Palpations: Abdomen is soft.     Tenderness: There is abdominal tenderness (Generalized). There is no right CVA tenderness, left CVA tenderness, guarding or rebound.     Comments: No pain noticed when palpating the abdomen outside of expressed pain, patient smiling while during abdomen palpation  Musculoskeletal:        General: No swelling, deformity or signs of injury.     Cervical back: Normal range of motion. No rigidity.     Right lower leg: No edema.     Left lower leg: No edema.  Skin:    General: Skin is warm and dry.     Findings: No bruising, erythema or lesion.  Neurological:     General: No focal deficit present.     Mental Status: He is alert and oriented to person, place, and time. Mental status is  at baseline.     Sensory: No sensory deficit.     Motor: No weakness.  Psychiatric:        Mood and Affect: Mood normal.     (all labs ordered are listed, but only abnormal results are displayed) Labs Reviewed  COMPREHENSIVE METABOLIC PANEL WITH GFR - Abnormal; Notable for the following components:      Result Value   Total Protein 8.6 (*)    Total Bilirubin 1.3 (*)    All other components within normal limits  URINALYSIS, ROUTINE W REFLEX MICROSCOPIC - Abnormal; Notable for the following components:   Color, Urine AMBER (*)    APPearance HAZY (*)    Specific Gravity, Urine 1.032 (*)    Ketones, ur 20 (*)    Protein, ur 30 (*)    All other components within normal limits  CBC WITH DIFFERENTIAL/PLATELET  LIPASE, BLOOD  RAPID URINE DRUG SCREEN, HOSP PERFORMED  TROPONIN I (HIGH SENSITIVITY)    EKG: EKG Interpretation Date/Time:  Tuesday September 28 2023 13:42:20 EDT Ventricular Rate:  78 PR Interval:  130 QRS Duration:  90 QT Interval:  350 QTC Calculation: 399 R Axis:   64  Text Interpretation: Normal sinus rhythm Nonspecific T wave abnormality No significant change since last tracing When compared with ECG of 03-Jun-2022 19:45, PREVIOUS ECG IS PRESENT Confirmed by Doretha Folks ([id number]) on 09/28/2023 2:28:03 PM  Radiology: No results found.  Procedures   Medications Ordered in the ED - No data to display            HEART Score: 1                  PERC Score: 0, PERC Score Interpretation: No need for further workup, as <2% chance of PE.  If no criteria are positive and clinicians pre-test probability is <15%, PERC Rule criteria are satisfied Medical Decision Making  This patient is a 23 year old male who presents to the ED for concern of multiple complaints.  Initially was here for consultation about his acquisition but also stated that he started having some palpitations and some abdominal pain after a heavy night of drinking 2 nights ago.  Reported nausea and  vomiting yesterday.  Notes that he has been feeling significantly better but still had persistent palpitations.  Reported THC use and significant nicotine  use today.  On physical exam, patient is in no acute distress, afebrile, alert and orient x 4, speaking in full sentences, nontachypneic, nontachycardic.  LCTAB, RRR, no murmur.  No lower leg edema.  Patient reported abdominal pain to palpation however was  smiling during the exam.  No CVA tenderness.  Unremarkable exam otherwise.  With patient's current presentation, low suspicion for any emergent cause of symptoms however will workup Palpitations and abdominal pain due to him having nausea and vomiting.  CMP shows a mildly elevated bilirubin but otherwise unremarkable.  UA does show some ketones, likely secondary to dehydration due to the nausea and vomiting and excessive drinking.  He is tolerating p.o. intake, low suspicion for any emergent causes of symptoms at time.  Will have him continue to follow-up establish care with PCP.  Patient vital signs have remained stable throughout the course of patient's time in the ED. Low suspicion for any other emergent pathology at this time. I believe this patient is safe to be discharged. Provided strict return to ER precautions. Patient expressed agreement and understanding of plan. All questions were answered.  Differential diagnoses prior to evaluation: The emergent differential diagnosis includes, but is not limited to, drug intoxication, drug side effect, malingering, schizophrenia/schizoaffective disorder,. This is not an exhaustive differential.   Past Medical History / Co-morbidities / Social History: Schizoaffective disorder, GAD, IED, polysubstance abuse (benzo, meth, THC) and 4 prior psychiatric hospitalizations,  Additional history: Chart reviewed. Pertinent results include:   Seen on 07/10/2023 at the behavioral health urgent care, escorted by police that time under IVC for erratic behavior.   Depakote  was titrated up gradually, risperidone  was titrated up gradually, Ativan  discontinued.  Lab Tests/Imaging studies: I personally interpreted labs/imaging and the pertinent results include:  CBC unremarkable CMP shows 1.3 bilirubin but otherwise unremarkable Lipase unremarkable Troponin unremarkable UA does show elevated specific gravity and ketones.  Likely secondary to dehydration.   UDS pending, does not change disposition at this time.  Cardiac monitoring: EKG obtained and interpreted by myself and attending physician which shows: Normal sinus rhythm with nonspecific ST normalities.  EKG Interpretation Date/Time:  Tuesday September 28 2023 13:42:20 EDT Ventricular Rate:  78 PR Interval:  130 QRS Duration:  90 QT Interval:  350 QTC Calculation: 399 R Axis:   64  Text Interpretation: Normal sinus rhythm Nonspecific T wave abnormality No significant change since last tracing When compared with ECG of 03-Jun-2022 19:45, PREVIOUS ECG IS PRESENT Confirmed by Doretha Folks ([id number]) on 09/28/2023 2:28:03 PM          Medications: I ordered medication including Zofran .  I have reviewed the patients home medicines and have made adjustments as needed.  Critical Interventions: None  Social Determinants of Health: Notably has history of physical disorder and being combative, noncompliance with medications.  Disposition: After consideration of the diagnostic results and the patients response to treatment, I feel that the patient would benefit from discharge and treatment as above.   emergency department workup does not suggest an emergent condition requiring admission or immediate intervention beyond what has been performed at this time. The plan is: Follow-up with PCP and establish care, return for new or worsening symptoms. The patient is safe for discharge and has been instructed to return immediately for worsening symptoms, change in symptoms or any other concerns.   Final  diagnoses:  Palpitations  Generalized abdominal pain    ED Discharge Orders          Ordered    ondansetron  (ZOFRAN ) 4 MG tablet  Every 6 hours        09/28/23 1529               Jeovanny Cuadros S, PA-C 09/28/23 1533    Doretha Folks, MD  09/28/23 1553  

## 2023-09-28 NOTE — ED Notes (Signed)
 Pt given urine cup for sample when able to void.

## 2023-09-28 NOTE — ED Notes (Signed)
 Awaiting patient from lobby.

## 2023-09-28 NOTE — ED Triage Notes (Signed)
 Pt arrives POV c/o being addicted to sex. States that he has not been taking some of my medications lately because the people at the hospital told me that I didn't have to take them on the weekend. Denies SI/HI/AVH/drug use. No other complaints at this time.

## 2023-09-28 NOTE — Discharge Instructions (Addendum)
 You are seen today for palpitations as well as general abdominal pain.  These are both likely secondary to the drinking episode you had a couple days ago as well as the THC/nicotine  used today.  Recommend that you do not drink as well as to avoid nicotine  and THC while you are recovering.   Would recommend you also follow-up and establish care with a primary care that if you begin to have any persistent symptoms, that you would have them manage and monitor your symptoms.  Recommend that you see them as you may need to see cardiologist if you continue to have palpitations.  Return to the ED with any new or worsening symptoms include chest pain, shortness of breath, blood in urine or stool or vomit.

## 2023-09-28 NOTE — ED Notes (Signed)
 Pt comfortable, warm blanket provided, and he is resting

## 2023-10-01 ENCOUNTER — Other Ambulatory Visit: Payer: Self-pay

## 2023-10-01 ENCOUNTER — Emergency Department (HOSPITAL_COMMUNITY)
Admission: EM | Admit: 2023-10-01 | Discharge: 2023-10-02 | Disposition: A | Discharge status: Home / Self Care | Payer: MEDICAID | Attending: Emergency Medicine | Admitting: Emergency Medicine

## 2023-10-01 ENCOUNTER — Encounter (HOSPITAL_COMMUNITY): Payer: Self-pay | Admitting: Emergency Medicine

## 2023-10-01 DIAGNOSIS — R110 Nausea: Secondary | ICD-10-CM | POA: Insufficient documentation

## 2023-10-01 DIAGNOSIS — D72829 Elevated white blood cell count, unspecified: Secondary | ICD-10-CM | POA: Diagnosis not present

## 2023-10-01 NOTE — ED Triage Notes (Signed)
 Pt in from home via [facility] with reported n/v since waking up from a nap this afternoon. Denies any diarrhea

## 2023-10-02 LAB — CBC WITH DIFFERENTIAL/PLATELET
Abs Immature Granulocytes: 0.05 K/uL (ref 0.00–0.07)
Basophils Absolute: 0.1 K/uL (ref 0.0–0.1)
Basophils Relative: 1 %
Eosinophils Absolute: 0.1 K/uL (ref 0.0–0.5)
Eosinophils Relative: 1 %
HCT: 45.4 % (ref 39.0–52.0)
Hemoglobin: 15.3 g/dL (ref 13.0–17.0)
Immature Granulocytes: 1 %
Lymphocytes Relative: 14 %
Lymphs Abs: 1.5 K/uL (ref 0.7–4.0)
MCH: 30.9 pg (ref 26.0–34.0)
MCHC: 33.7 g/dL (ref 30.0–36.0)
MCV: 91.7 fL (ref 80.0–100.0)
Monocytes Absolute: 0.8 K/uL (ref 0.1–1.0)
Monocytes Relative: 7 %
Neutro Abs: 8.6 K/uL — ABNORMAL HIGH (ref 1.7–7.7)
Neutrophils Relative %: 76 %
Platelets: 261 K/uL (ref 150–400)
RBC: 4.95 MIL/uL (ref 4.22–5.81)
RDW: 13.2 % (ref 11.5–15.5)
WBC: 11.1 K/uL — ABNORMAL HIGH (ref 4.0–10.5)
nRBC: 0 % (ref 0.0–0.2)

## 2023-10-02 LAB — COMPREHENSIVE METABOLIC PANEL WITH GFR
ALT: 12 U/L (ref 0–44)
AST: 18 U/L (ref 15–41)
Albumin: 4 g/dL (ref 3.5–5.0)
Alkaline Phosphatase: 39 U/L (ref 38–126)
Anion gap: 11 (ref 5–15)
BUN: 8 mg/dL (ref 6–20)
CO2: 25 mmol/L (ref 22–32)
Calcium: 9.5 mg/dL (ref 8.9–10.3)
Chloride: 105 mmol/L (ref 98–111)
Creatinine, Ser: 0.93 mg/dL (ref 0.61–1.24)
GFR, Estimated: >60 mL/min (ref >60)
Glucose, Bld: 151 mg/dL — ABNORMAL HIGH (ref 70–99)
Potassium: 3.2 mmol/L — ABNORMAL LOW (ref 3.5–5.1)
Sodium: 141 mmol/L (ref 135–145)
Total Bilirubin: 0.6 mg/dL (ref 0.0–1.2)
Total Protein: 7.9 g/dL (ref 6.5–8.1)

## 2023-10-02 LAB — LIPASE, BLOOD: Lipase: 38 U/L (ref 11–51)

## 2023-10-02 MED ORDER — ONDANSETRON HCL 4 MG/2ML IJ SOLN
4.0000 mg | Freq: Once | INTRAMUSCULAR | Status: AC
  Administered 2023-10-02: 4 mg via INTRAVENOUS
  Filled 2023-10-02: qty 2

## 2023-10-02 MED ORDER — ONDANSETRON HCL 4 MG PO TABS: 4.0000 mg | ORAL_TABLET | Freq: Four times a day (QID) | ORAL | 0 refills | Status: DC

## 2023-10-02 MED ORDER — POTASSIUM CHLORIDE CRYS ER 20 MEQ PO TBCR
40.0000 meq | EXTENDED_RELEASE_TABLET | Freq: Once | ORAL | Status: AC
  Administered 2023-10-02: 40 meq via ORAL
  Filled 2023-10-02: qty 2

## 2023-10-02 NOTE — ED Provider Notes (Incomplete)
 [place] EMERGENCY DEPARTMENT AT [facility] Provider Note   CSN: [id number] Arrival date & time: 10/01/23  2347     Patient presents with: Emesis and Nausea   Jay Woods is a 23 y.o. male with medical history of polysubstance abuse, schizoaffective disorder, methamphetamine use disorder, nicotine  use disorder, cannabis use disorder.  Patient presents to ED for evaluation of nausea.  The patient reports that he woke up from a nap this afternoon around 1:50 PM.  States that he had nausea at that time without vomiting.  Reports that he feels as if he has had a throw up but has not thrown up today.  Denies diarrhea.  Denies abdominal pain.  Denies fevers at home.  Does endorse slight dysuria but states he is not concerned about STIs.  Denies medications prior to arrival.  Does endorse marijuana use today but reports he only hit the pen 1 time.  Denies alcohol use.  Denies recent antibiotics, international travel, new food exposures.  Last bowel movement this morning.    Emesis      Prior to Admission medications   Medication Sig Start Date End Date Taking? Authorizing Provider  divalproex  (DEPAKOTE  ER) 500 MG 24 hr tablet Take 4 tablets (2,000 mg total) by mouth daily at 8 pm. 08/02/23   Harl Zane BRAVO, NP  hydrOXYzine  (ATARAX ) 25 MG tablet Take 1 tablet (25 mg total) by mouth 3 (three) times daily as needed for anxiety. 08/02/23   Harl Zane BRAVO, NP  ondansetron  (ZOFRAN ) 4 MG tablet Take 1 tablet (4 mg total) by mouth every 6 (six) hours. 09/28/23   Bauer, Collin S, PA-C  risperiDONE  (RISPERDAL ) 3 MG tablet Take 2 tablets (6 mg total) by mouth at bedtime. 08/02/23   Harl Zane BRAVO, NP  traZODone  (DESYREL ) 50 MG tablet Take 1 tablet (50 mg total) by mouth at bedtime as needed for sleep. 08/02/23   Harl Zane BRAVO, NP    Allergies: Patient has no known allergies.    Review of Systems  Gastrointestinal:  Positive for vomiting.    Updated Vital  Signs Wt 79.4 kg   BMI 25.84 kg/m   Physical Exam  (all labs ordered are listed, but only abnormal results are displayed) Labs Reviewed  CBC WITH DIFFERENTIAL/PLATELET  COMPREHENSIVE METABOLIC PANEL WITH GFR  LIPASE, BLOOD  URINALYSIS, ROUTINE W REFLEX MICROSCOPIC    EKG: None  Radiology: No results found.  {Document cardiac monitor, telemetry assessment procedure when appropriate:32947} Procedures   Medications Ordered in the ED  ondansetron  (ZOFRAN ) injection 4 mg (has no administration in time range)      {Click here for ABCD2, HEART and other calculators REFRESH Note before signing:1}                              Medical Decision Making Amount and/or Complexity of Data Reviewed Labs: ordered. ECG/medicine tests: ordered.  Risk Prescription drug management.   ***  {Document critical care time when appropriate  Document review of labs and clinical decision tools ie CHADS2VASC2, etc  Document your independent review of radiology images and any outside records  Document your discussion with family members, caretakers and with consultants  Document social determinants of health affecting pt's care  Document your decision making why or why not admission, treatments were needed:32947:::1}   Final diagnoses:  None    ED Discharge Orders     None

## 2023-10-02 NOTE — Discharge Instructions (Addendum)
 It was a pleasure taking part in your care.  As discussed, laboratory evaluation here is reassuring.  Follow-up with your PCP.  Take Zofran  every 6 hours as needed.  Return to ED with any new symptoms.

## 2023-10-02 NOTE — ED Provider Notes (Signed)
 [place] EMERGENCY DEPARTMENT AT [facility] Provider Note   CSN: [id number] Arrival date & time: 10/01/23  2347     Patient presents with: Emesis and Nausea   Jay Woods is a 23 y.o. male with medical history of polysubstance abuse, schizoaffective disorder, methamphetamine use disorder, nicotine  use disorder, cannabis use disorder.  Patient presents to ED for evaluation of nausea.  The patient reports that he woke up from a nap this afternoon around 1:50 PM.  States that he had nausea at that time without vomiting.  Reports that he feels as if he has had a throw up but has not thrown up today.  Denies diarrhea.  Denies abdominal pain.  Denies fevers at home.  Denies dysuria or concern for STI. Denies medications prior to arrival.  Does endorse marijuana use today but reports he only hit the pen 1 time.  Denies alcohol use.  Denies recent antibiotics, international travel, new food exposures.  Last bowel movement this morning.    Emesis Associated symptoms: no abdominal pain and no diarrhea        Prior to Admission medications   Medication Sig Start Date End Date Taking? Authorizing Provider  ondansetron  (ZOFRAN ) 4 MG tablet Take 1 tablet (4 mg total) by mouth every 6 (six) hours. 10/02/23  Yes Ruthell Lonni FALCON, PA-C  divalproex  (DEPAKOTE  ER) 500 MG 24 hr tablet Take 4 tablets (2,000 mg total) by mouth daily at 8 pm. 08/02/23   Harl Zane BRAVO, NP  hydrOXYzine  (ATARAX ) 25 MG tablet Take 1 tablet (25 mg total) by mouth 3 (three) times daily as needed for anxiety. 08/02/23   Harl Zane BRAVO, NP  risperiDONE  (RISPERDAL ) 3 MG tablet Take 2 tablets (6 mg total) by mouth at bedtime. 08/02/23   Harl Zane BRAVO, NP  traZODone  (DESYREL ) 50 MG tablet Take 1 tablet (50 mg total) by mouth at bedtime as needed for sleep. 08/02/23   Harl Zane BRAVO, NP    Allergies: Patient has no known allergies.    Review of Systems  Gastrointestinal:  Positive for nausea.  Negative for abdominal pain, diarrhea and vomiting.  All other systems reviewed and are negative.   Updated Vital Signs BP (!) 142/80 (BP Location: Right Arm)   Pulse 77   Temp 98.6 F (37 C) (Oral)   Resp (!) 21   Wt 79.4 kg   SpO2 98%   BMI 25.84 kg/m   Physical Exam Vitals and nursing note reviewed.  Constitutional:      General: He is not in acute distress.    Appearance: He is well-developed.  HENT:     Head: Normocephalic and atraumatic.  Eyes:     Conjunctiva/sclera: Conjunctivae normal.  Cardiovascular:     Rate and Rhythm: Normal rate and regular rhythm.     Heart sounds: No murmur heard. Pulmonary:     Effort: Pulmonary effort is normal. No respiratory distress.     Breath sounds: Normal breath sounds.  Abdominal:     Palpations: Abdomen is soft.     Tenderness: There is no abdominal tenderness.     Comments: Benign abdomen.  No tenderness noted.  Musculoskeletal:        General: No swelling.     Cervical back: Neck supple.  Skin:    General: Skin is warm and dry.     Capillary Refill: Capillary refill takes less than 2 seconds.  Neurological:     Mental Status: He is alert.  Psychiatric:  Mood and Affect: Mood normal.     (all labs ordered are listed, but only abnormal results are displayed) Labs Reviewed  CBC WITH DIFFERENTIAL/PLATELET - Abnormal; Notable for the following components:      Result Value   WBC 11.1 (*)    Neutro Abs 8.6 (*)    All other components within normal limits  COMPREHENSIVE METABOLIC PANEL WITH GFR - Abnormal; Notable for the following components:   Potassium 3.2 (*)    Glucose, Bld 151 (*)    All other components within normal limits  LIPASE, BLOOD    EKG: EKG Interpretation Date/Time:  Saturday October 02 2023 00:09:47 EDT Ventricular Rate:  65 PR Interval:  146 QRS Duration:  88 QT Interval:  377 QTC Calculation: 392 R Axis:   70  Text Interpretation: Sinus rhythm Anteroseptal infarct, old When compared  with ECG of 09/28/2023, No significant change was found Confirmed by Raford Lenis ([id number]) on 10/02/2023 12:14:11 AM  Radiology: No results found.   Procedures   Medications Ordered in the ED  potassium chloride  SA (KLOR-CON  M) CR tablet 40 mEq (has no administration in time range)  ondansetron  (ZOFRAN ) injection 4 mg (4 mg Intravenous Given 10/02/23 0013)     Medical Decision Making Amount and/or Complexity of Data Reviewed Labs: ordered. ECG/medicine tests: ordered.  Risk Prescription drug management.   This is a 23 year old male who presents to the ED for evaluation of nausea.  Denies abdominal pain, vomiting, diarrhea, fevers.  Reports nausea began after waking up from a nap this evening.  Denies alcohol use, does endorse marijuana use.  On arrival, patient is afebrile and nontachycardic.  His lung sounds are clear bilaterally, there is no hypoxia.  Abdomen is soft compressible with no tenderness, benign abdomen.  Neurological examination at baseline.  Overall nontoxic in appearance with reassuring vital signs.  Patient labwork with slight leukocytosis 11.1, no anemia.  This is nonspecific in the absence of fever, tachycardia or other systemic signs of infection.  Could be secondary to nausea. Metabolic panel potassium 3.2, repleted with 40 mEq oral potassium.  No other electrolyte derangement.  No transaminitis, anion gap elevation.  Lipase 30.  At this time patient passed p.o. fluid challenge.  Will discharge home.  Advised to follow-up with PCP.  Sent home with Zofran .  Stable to discharge.    Final diagnoses:  Nausea    ED Discharge Orders          Ordered    ondansetron  (ZOFRAN ) 4 MG tablet  Every 6 hours        10/02/23 0123               Ruthell Lonni FALCON, PA-C 10/02/23 0123    Raford Lenis, MD 10/02/23 [phone]

## 2023-10-02 NOTE — ED Notes (Signed)
Patient verbalizes understanding of discharge instructions. Opportunity for questioning and answers were provided. Armband removed by staff, pt discharged from ED. Ambulated out to lobby  

## 2023-10-14 ENCOUNTER — Telehealth (HOSPITAL_COMMUNITY): Payer: MEDICAID | Admitting: Physician Assistant

## 2023-10-14 ENCOUNTER — Encounter (HOSPITAL_COMMUNITY): Payer: Self-pay

## 2023-10-20 NOTE — ED Provider Notes (Signed)
 ------------------------------------------------------------------------------- Attestation signed by Beverley Lynwood Goltz, MD at 10/22/2023 10:47 AM ATTENDING SUPERVISORY NOTE  I supervised the care provided by Dr. Cristobal. We have discussed the patient's case. I have reviewed the note and agree with the plan of treatment.  I have seen and examined the patient, providing direct face to face care.    I have reviewed the pertinent  past medical history, family history, and social history. I have reviewed the documentation of Dr. Cristobal and agree.  I have personally viewed the imaging studies performed. I personally reviewed patient's labs.  Patient's presentation is most consistent with  acute presentation with potential threat to life or bodily function.  I was present for the following procedures: None  Time Spent in Critical Care of the patient: None    -------------------------------------------------------------------------------  [facility] Emergency Department Physician Note  History of Present Illness  Jay Woods is a 23 y.o. male w/ PMH significant for none who is presenting for chest pain.  Patient reports 1 day of left-sided chest pain that is nonradiating and sharp.  He notes it is worse with movement.  Endorses associated shortness of breath but no recent cough, fevers, congestion, nausea, vomiting.  No recent trauma to the chest.  Previous tobacco use.  No history of DVT/PE.  No hemoptysis.  No hormone therapy.  Denies any bitter taste in his mouth, increased belching.  No history of gastric ulcers.  History provided by patient.   Medical History: Allergies[1]  Medical History[2]   Surgical History[3]  Family History[4]    Social History[5]  Home Medications: No current outpatient medications   Physical Exam   No data found.  Vitals:   10/20/23 1824 10/20/23 2304  BP: 135/86 123/88  BP Location: Right arm Right arm   Patient Position: Sitting   Pulse: 68 76  Resp: 16 16  Temp: 98 F (36.7 C) 98.3 F (36.8 C)  TempSrc:  Tympanic  SpO2: 100% 99%  Weight: 70.3 kg (155 lb)   Height: 175.3 cm (5' 9)     Physical Exam Constitutional:      General: He is not in acute distress.    Appearance: He is not ill-appearing.  HENT:     Mouth/Throat:     Mouth: Mucous membranes are moist.     Pharynx: Oropharynx is clear.  Cardiovascular:     Rate and Rhythm: Normal rate and regular rhythm.     Heart sounds: Normal heart sounds.  Pulmonary:     Effort: Pulmonary effort is normal.     Breath sounds: Normal breath sounds. No wheezing, rhonchi or rales.  Chest:     Chest wall: Tenderness present.  Abdominal:     Palpations: Abdomen is soft.     Tenderness: There is no abdominal tenderness.  Musculoskeletal:     Right lower leg: No edema.     Left lower leg: No edema.  Skin:    General: Skin is warm and dry.     Findings: No rash.  Neurological:     Mental Status: He is alert. Mental status is at baseline.      ED Course and Medical Decision Making   I have reviewed the nursing documentation for past medical history, family history, and social history and agree.  Key Medications administered in the ER: These medications and interventions were provided for the patient while in the ED. Medications - No data to display    Medical Decision Making: 23 year old male  with history of polysubstance abuse, schizoaffective disorder presenting from jail for 1 day of right-sided chest pain that is worse with movement and with mild shortness of breath. Patient unable to give much clarifying information or answers to questions - will respond with I do not know frequently.  On arrival, vitals within normal limits.  Exam is overall reassuring with reproducible chest wall tenderness on palpation.  No abdominal tenderness, no lower extremity edema.  Differential includes MSK pain versus acid reflux versus  less likely ACS given age and heart score of 1 although patient does have a history of substance abuse versus less likely PE given patient is Wells low risk and PERC negative.  No infectious symptoms to suggest pneumonia and no vital sign abnormalities or significant shortness of breath to suggest pneumothorax.  EKG with sinus bradycardia at a rate of 57 bpm.  Normal intervals.  No ST segment elevation or depression.  Isolated T wave inversion to lead III which is new from a biphasic T wave in same lead 3 years ago.  Chest x-ray with no acute cardiopulmonary abnormality including no focal opacities, no pneumothorax, no obvious rib fractures.  Laboratory evaluation with 2 negative troponins, negative BNP, normal electrolytes, no AKI, no leukocytosis, no anemia.  Discussed results and reassuring evaluation with the patient.  Recommended Tylenol  and ibuprofen  as needed for tenderness and soreness.  Recommended PCP follow-up.  Discussed strict return precautions and patient discharged in stable condition.      Clinical Impression:  1. Chest pain, unspecified type     Disposition: ED Disposition     ED Disposition  Discharge   Condition  Stable   Comment  --         The plan for this patient was discussed with the attending physician, who voiced agreement and who oversaw evaluation and treatment of this patient.  -------- MDM generated using voice dictation software and may contain dictation errors. Please contact me for any clarification or with any questions.   Marolyn Fly, MD Emergency Medicine PGY-2  Electronically signed by:  Marolyn Fly, MD 10/20/2023 10:54 PM        [1] No Known Allergies [2] No past medical history on file. [3] No past surgical history on file. [4] No family history on file. [5] Social History Tobacco Use  . Smoking status: Every Day  . Smokeless tobacco: Never  Substance Use Topics  . Alcohol use: Never

## 2023-11-05 ENCOUNTER — Ambulatory Visit (HOSPITAL_COMMUNITY)
Admission: EM | Admit: 2023-11-05 | Discharge: 2023-11-06 | Disposition: A | Discharge status: Other Acute Inpatient Hospital | Payer: MEDICAID | Attending: Family Medicine | Admitting: Family Medicine

## 2023-11-05 DIAGNOSIS — F250 Schizoaffective disorder, bipolar type: Secondary | ICD-10-CM | POA: Diagnosis not present

## 2023-11-05 DIAGNOSIS — Z653 Problems related to other legal circumstances: Secondary | ICD-10-CM | POA: Diagnosis not present

## 2023-11-05 DIAGNOSIS — Z91148 Patient's other noncompliance with medication regimen for other reason: Secondary | ICD-10-CM | POA: Insufficient documentation

## 2023-11-05 DIAGNOSIS — F1490 Cocaine use, unspecified, uncomplicated: Secondary | ICD-10-CM | POA: Insufficient documentation

## 2023-11-05 DIAGNOSIS — U070 Vaping-related disorder: Secondary | ICD-10-CM | POA: Diagnosis not present

## 2023-11-05 DIAGNOSIS — F17200 Nicotine dependence, unspecified, uncomplicated: Secondary | ICD-10-CM | POA: Insufficient documentation

## 2023-11-05 DIAGNOSIS — F1290 Cannabis use, unspecified, uncomplicated: Secondary | ICD-10-CM | POA: Insufficient documentation

## 2023-11-05 DIAGNOSIS — Z79899 Other long term (current) drug therapy: Secondary | ICD-10-CM | POA: Insufficient documentation

## 2023-11-05 DIAGNOSIS — R1110 Vomiting, unspecified: Secondary | ICD-10-CM | POA: Insufficient documentation

## 2023-11-05 LAB — CBC WITH DIFFERENTIAL/PLATELET
Abs Immature Granulocytes: 0.03 K/uL (ref 0.00–0.07)
Basophils Absolute: 0.1 K/uL (ref 0.0–0.1)
Basophils Relative: 1 %
Eosinophils Absolute: 0.1 K/uL (ref 0.0–0.5)
Eosinophils Relative: 1 %
HCT: 45.3 % (ref 39.0–52.0)
Hemoglobin: 15.1 g/dL (ref 13.0–17.0)
Immature Granulocytes: 0 %
Lymphocytes Relative: 24 %
Lymphs Abs: 2 K/uL (ref 0.7–4.0)
MCH: 31.1 pg (ref 26.0–34.0)
MCHC: 33.3 g/dL (ref 30.0–36.0)
MCV: 93.2 fL (ref 80.0–100.0)
Monocytes Absolute: 0.7 K/uL (ref 0.1–1.0)
Monocytes Relative: 8 %
Neutro Abs: 5.5 K/uL (ref 1.7–7.7)
Neutrophils Relative %: 66 %
Platelets: 270 K/uL (ref 150–400)
RBC: 4.86 MIL/uL (ref 4.22–5.81)
RDW: 12.9 % (ref 11.5–15.5)
WBC: 8.3 K/uL (ref 4.0–10.5)
nRBC: 0 % (ref 0.0–0.2)

## 2023-11-05 LAB — LIPID PANEL
Cholesterol: 167 mg/dL (ref 0–200)
HDL: 59 mg/dL (ref >40)
LDL Cholesterol: 94 mg/dL (ref 0–99)
Total CHOL/HDL Ratio: 2.8 ratio
Triglycerides: 72 mg/dL (ref <150)
VLDL: 14 mg/dL (ref 0–40)

## 2023-11-05 LAB — URINALYSIS, ROUTINE W REFLEX MICROSCOPIC
Bilirubin Urine: NEGATIVE
Glucose, UA: NEGATIVE mg/dL
Hgb urine dipstick: NEGATIVE
Ketones, ur: NEGATIVE mg/dL
Leukocytes,Ua: NEGATIVE
Nitrite: NEGATIVE
Protein, ur: NEGATIVE mg/dL
Specific Gravity, Urine: 1.021 (ref 1.005–1.030)
pH: 6 (ref 5.0–8.0)

## 2023-11-05 LAB — HEMOGLOBIN A1C
Hgb A1c MFr Bld: 5 % (ref 4.8–5.6)
Mean Plasma Glucose: 96.8 mg/dL

## 2023-11-05 LAB — COMPREHENSIVE METABOLIC PANEL WITH GFR
ALT: 29 U/L (ref 0–44)
AST: 23 U/L (ref 15–41)
Albumin: 4.6 g/dL (ref 3.5–5.0)
Alkaline Phosphatase: 38 U/L (ref 38–126)
Anion gap: 15 (ref 5–15)
BUN: 10 mg/dL (ref 6–20)
CO2: 20 mmol/L — ABNORMAL LOW (ref 22–32)
Calcium: 9.2 mg/dL (ref 8.9–10.3)
Chloride: 102 mmol/L (ref 98–111)
Creatinine, Ser: 0.78 mg/dL (ref 0.61–1.24)
GFR, Estimated: >60 mL/min (ref >60)
Glucose, Bld: 88 mg/dL (ref 70–99)
Potassium: 3.4 mmol/L — ABNORMAL LOW (ref 3.5–5.1)
Sodium: 137 mmol/L (ref 135–145)
Total Bilirubin: 0.7 mg/dL (ref 0.0–1.2)
Total Protein: 8.5 g/dL — ABNORMAL HIGH (ref 6.5–8.1)

## 2023-11-05 LAB — VALPROIC ACID LEVEL: Valproic Acid Lvl: <10 ug/mL — ABNORMAL LOW (ref 50–100)

## 2023-11-05 MED ORDER — LORAZEPAM 2 MG/ML IJ SOLN: 2.0000 mg | Freq: Three times a day (TID) | INTRAMUSCULAR | Status: DC | PRN

## 2023-11-05 MED ORDER — HALOPERIDOL LACTATE 5 MG/ML IJ SOLN: 5.0000 mg | Freq: Three times a day (TID) | INTRAMUSCULAR | Status: DC | PRN

## 2023-11-05 MED ORDER — HALOPERIDOL LACTATE 5 MG/ML IJ SOLN: 10.0000 mg | Freq: Three times a day (TID) | INTRAMUSCULAR | Status: DC | PRN

## 2023-11-05 MED ORDER — DIPHENHYDRAMINE HCL 50 MG/ML IJ SOLN: 50.0000 mg | Freq: Three times a day (TID) | INTRAMUSCULAR | Status: DC | PRN

## 2023-11-05 MED ORDER — ACETAMINOPHEN 325 MG PO TABS: 650.0000 mg | ORAL_TABLET | Freq: Four times a day (QID) | ORAL | Status: DC | PRN

## 2023-11-05 MED ORDER — HALOPERIDOL 5 MG PO TABS: 5.0000 mg | ORAL_TABLET | Freq: Three times a day (TID) | ORAL | Status: DC | PRN

## 2023-11-05 MED ORDER — DIPHENHYDRAMINE HCL 50 MG PO CAPS: 50.0000 mg | ORAL_CAPSULE | Freq: Three times a day (TID) | ORAL | Status: DC | PRN

## 2023-11-05 MED ORDER — MAGNESIUM HYDROXIDE 400 MG/5ML PO SUSP: 30.0000 mL | Freq: Every day | ORAL | Status: DC | PRN

## 2023-11-05 MED ORDER — ALUM & MAG HYDROXIDE-SIMETH 200-200-20 MG/5ML PO SUSP: 30.0000 mL | ORAL | Status: DC | PRN

## 2023-11-05 NOTE — ED Notes (Signed)
 Patient admitted to adult obs under IVC for bizarre behaviors. Patient is calm and cooperative. Has a flat affect. He denies SI/HI or AVH. Pt denies physical pain or discomfort. Skin check conducted by this nurse and Arlyne, MHT. No open wounds, sores, or abrasions observed. Pt oriented to the unit and provided dinner and beverage. We will continue to monitor for safety.

## 2023-11-05 NOTE — ED Provider Notes (Signed)
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 hospitalization recommended  First exam completed.    Sherrell Culver, PMHNP-BC, FNP-BC  11/05/23  5:21 PM
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
## 2023-11-05 NOTE — Progress Notes (Signed)
   11/05/23 1559  [facility] Triage Screening (Walk-ins at [facility] only)  How Did You Hear About Us ? Legal System  What Is the Reason for Your Visit/Call Today? Pt Jay Woods is a 23Y male presenting to [facility] via [facility] under IVC. Per IVC (taken out by his sister) Pt has been diagnosed with schizophrenia and bipolar disorder. Pt is refusing all medication. Pt hasnt showered in days nor has he eaten. Pt has been very agressive , banging and kicking on doors at 2am, and knocking on neighbors doors. Pt as also been found in the crawl space underneath his home because the dog is bothering him. Upon assessment pt denies SI,HI, and AVH. Pt was arrested last week and has an uncoming court date on 11/18/23. Pt has been using cocaine and THC and has been vomitting due to vaping.  How Long Has This Been Causing You Problems? <Week  Have You Recently Had Any Thoughts About Hurting Yourself? No  Are You Planning to Commit Suicide/Harm Yourself At This time? No  Have you Recently Had Thoughts About Hurting Someone Sherral? No  Are You Planning To Harm Someone At This Time? No  Physical Abuse Denies  Verbal Abuse Denies  Sexual Abuse Denies  Exploitation of patient/patient's resources Denies  Are you currently experiencing any auditory, visual or other hallucinations? No  Have You Used Any Alcohol or Drugs in the Past 24 Hours? Yes  What Did You Use and How Much? THC 1 cart  Do you have any current medical co-morbidities that require immediate attention? No  What Do You Feel Would Help You the Most Today? Alcohol or Drug Use Treatment;Treatment for Depression or other mood problem  If access to [facility] Urgent Care was not available, would you have sought care in the Emergency Department? No  Determination of Need Urgent (48 hours)  Options For Referral Inpatient Hospitalization;BH Urgent Care;Intensive Outpatient Therapy;Medication Management  Determination of Need filed? Yes

## 2023-11-06 ENCOUNTER — Other Ambulatory Visit: Payer: Self-pay

## 2023-11-06 ENCOUNTER — Inpatient Hospital Stay (HOSPITAL_COMMUNITY)
Admission: AD | Admit: 2023-11-06 | Discharge: 2023-11-11 | DRG: 885 | Disposition: A | Discharge status: Home / Self Care | Payer: MEDICAID | Source: Intra-hospital

## 2023-11-06 ENCOUNTER — Encounter (HOSPITAL_COMMUNITY): Payer: Self-pay | Admitting: Family Medicine

## 2023-11-06 DIAGNOSIS — F1221 Cannabis dependence, in remission: Secondary | ICD-10-CM | POA: Diagnosis present

## 2023-11-06 DIAGNOSIS — F250 Schizoaffective disorder, bipolar type: Principal | ICD-10-CM | POA: Diagnosis present

## 2023-11-06 DIAGNOSIS — Z79899 Other long term (current) drug therapy: Secondary | ICD-10-CM

## 2023-11-06 DIAGNOSIS — Z91148 Patient's other noncompliance with medication regimen for other reason: Secondary | ICD-10-CM

## 2023-11-06 DIAGNOSIS — F17210 Nicotine dependence, cigarettes, uncomplicated: Secondary | ICD-10-CM | POA: Diagnosis present

## 2023-11-06 LAB — POCT URINE DRUG SCREEN - MANUAL ENTRY (I-SCREEN)
POC Amphetamine UR: NOT DETECTED
POC Buprenorphine (BUP): NOT DETECTED
POC Cocaine UR: NOT DETECTED
POC Marijuana UR: POSITIVE — AB
POC Methadone UR: NOT DETECTED
POC Methamphetamine UR: NOT DETECTED
POC Morphine: NOT DETECTED
POC Oxazepam (BZO): NOT DETECTED
POC Oxycodone UR: NOT DETECTED
POC Secobarbital (BAR): NOT DETECTED

## 2023-11-06 MED ORDER — DIVALPROEX SODIUM 500 MG PO DR TAB
500.0000 mg | DELAYED_RELEASE_TABLET | Freq: Two times a day (BID) | ORAL | Status: DC
  Administered 2023-11-06: 500 mg via ORAL
  Filled 2023-11-06: qty 1

## 2023-11-06 MED ORDER — LORAZEPAM 2 MG/ML IJ SOLN: 2.0000 mg | Freq: Three times a day (TID) | INTRAMUSCULAR | Status: DC | PRN

## 2023-11-06 MED ORDER — HALOPERIDOL LACTATE 5 MG/ML IJ SOLN: 5.0000 mg | Freq: Three times a day (TID) | INTRAMUSCULAR | Status: DC | PRN

## 2023-11-06 MED ORDER — MAGNESIUM HYDROXIDE 400 MG/5ML PO SUSP: 30.0000 mL | Freq: Every day | ORAL | Status: DC | PRN

## 2023-11-06 MED ORDER — HALOPERIDOL LACTATE 5 MG/ML IJ SOLN: 10.0000 mg | Freq: Three times a day (TID) | INTRAMUSCULAR | Status: DC | PRN

## 2023-11-06 MED ORDER — RISPERIDONE 0.5 MG PO TABS
0.5000 mg | ORAL_TABLET | Freq: Two times a day (BID) | ORAL | Status: DC
  Administered 2023-11-06 – 2023-11-07 (×2): 0.5 mg via ORAL
  Filled 2023-11-06 (×2): qty 1

## 2023-11-06 MED ORDER — DIPHENHYDRAMINE HCL 25 MG PO CAPS: 50.0000 mg | ORAL_CAPSULE | Freq: Three times a day (TID) | ORAL | Status: DC | PRN

## 2023-11-06 MED ORDER — HALOPERIDOL 5 MG PO TABS: 5.0000 mg | ORAL_TABLET | Freq: Three times a day (TID) | ORAL | Status: DC | PRN

## 2023-11-06 MED ORDER — DIPHENHYDRAMINE HCL 50 MG/ML IJ SOLN: 50.0000 mg | Freq: Three times a day (TID) | INTRAMUSCULAR | Status: DC | PRN

## 2023-11-06 MED ORDER — DIVALPROEX SODIUM 500 MG PO DR TAB
500.0000 mg | DELAYED_RELEASE_TABLET | Freq: Two times a day (BID) | ORAL | Status: DC
  Administered 2023-11-06 – 2023-11-07 (×2): 500 mg via ORAL
  Filled 2023-11-06 (×2): qty 1

## 2023-11-06 MED ORDER — RISPERIDONE 0.5 MG PO TABS
0.5000 mg | ORAL_TABLET | Freq: Two times a day (BID) | ORAL | Status: DC
  Administered 2023-11-06: 0.5 mg via ORAL
  Filled 2023-11-06: qty 1

## 2023-11-06 MED ORDER — TRAZODONE HCL 50 MG PO TABS
50.0000 mg | ORAL_TABLET | Freq: Every evening | ORAL | Status: DC | PRN
  Administered 2023-11-06 – 2023-11-10 (×5): 50 mg via ORAL
  Filled 2023-11-06 (×5): qty 1

## 2023-11-06 MED ORDER — ALUM & MAG HYDROXIDE-SIMETH 200-200-20 MG/5ML PO SUSP: 30.0000 mL | ORAL | Status: DC | PRN

## 2023-11-06 MED ORDER — ACETAMINOPHEN 325 MG PO TABS: 650.0000 mg | ORAL_TABLET | Freq: Four times a day (QID) | ORAL | Status: DC | PRN

## 2023-11-06 MED ORDER — NICOTINE POLACRILEX 2 MG MT GUM
2.0000 mg | CHEWING_GUM | OROMUCOSAL | Status: DC | PRN
  Administered 2023-11-07: 2 mg via ORAL

## 2023-11-06 NOTE — ED Notes (Signed)

## 2023-11-06 NOTE — ED Provider Notes (Signed)
 FBC/OBS ASAP Discharge Summary  Date and Time: 11/06/2023 10:35 AM  Name: Jay Woods  MRN:  [id number]   Discharge Diagnoses:  Final diagnoses:  Schizoaffective disorder, bipolar type [facility])    Subjective: I took the meds   Stay Summary: Per triage on 11/05/2023, Pt presented to [facility] via [facility] under IVC. Per IVC (taken out by his sister) Pt has been diagnosed with schizophrenia and bipolar disorder. Pt is refusing all medication. Pt hasnt showered in days nor has he eaten. Pt has been very agressive , banging and kicking on doors at 2am, and knocking on neighbors doors. Pt as also been found in the crawl space underneath his home because the dog is bothering him. Upon assessment pt denies SI,HI, and AVH. Pt was arrested last week and has an uncoming court date on 11/18/23. Pt has been using cocaine and THC and has been vomitting due to vaping.   Collateral was obtained from the pt's sister who reported the pt has not taken his prescribed medications (Risperdal , hydroxyzine , trazodone  and depakote ) for at least 1-2 months. Sister states she previously managed his medications and noted he was stable when adherent with medications. Sister states he had responded well to a long-acting injectable in the past and she believes he should be placed back on an LAI.  States patient began refusing medication due to paranoia, saying that is not my medicine.  He voluntarily admitted himself to a psychiatric facility 2 months ago but left early and return home more irritable and aggressive.  He was living with his child's mother but was no longer allowed there due to noncompliance with medication.  2 weeks ago, he became anxious and unpredictable, banging on his sister's door wanting to see his child in [place].  He stated in the home under renovation and later took his child without permission.  His sister initiated IVC, but before he could be served, the patient was arrested and spent a week in  jail.  Patient has recently become verbally aggressive over money.  He has not been showering or sleeping.  Patient frequently wanders and uses cocaine and marijuana.  Patient was started on Risperdal  0.5 mg p.o. twice daily and Depakote  DR 500 mg twice daily.  Patient received first dose of both medications this morning. Due to patient's history of schizoaffective disorder, bipolar type and current presentation with poor insight, poor judgment and impulsivity inpatient hospitalization is recommended.  Patient has been accepted to [facility] to Dr. Raliegh   Total Time spent with patient: 10 mins  Past Psychiatric History: Schizoaffective disorder, bipolar type Hospitalizations:[facility] March, 2025, June, 2025, [facility], 2024, [facility] August, 2025 Outpat Psych: unknown Medication trials: Divalproex , Risperdal , hydroxyzine . History of LAI a couple of years ago Past Medical History: history of vomiting   Family History: none reported family history includes Healthy in his father and mother.  Family Psych History:Sister - anxiety Younger brother: ADHD   Social History: single, currently living in his sister's car. Current substance use includes marijuana and cocaine.  Tobacco Cessation:  N/A, patient does not currently use tobacco products  Current Medications:  Current Facility-Administered Medications  Medication Dose Route Frequency Provider Last Rate Last Admin   acetaminophen  (TYLENOL ) tablet 650 mg  650 mg Oral Q6H PRN Merranda Bolls E, NP       alum & mag hydroxide-simeth (MAALOX/MYLANTA) 200-200-20 MG/5ML suspension 30 mL  30 mL Oral Q4H PRN Master Touchet E, NP  haloperidol  (HALDOL ) tablet 5 mg  5 mg Oral TID PRN Brittnie Lewey E, NP       And   diphenhydrAMINE  (BENADRYL ) capsule 50 mg  50 mg Oral TID PRN Keyerra Lamere E, NP       haloperidol  lactate (HALDOL ) injection 5 mg  5 mg Intramuscular TID PRN Demetrius Mahler E, NP       And   diphenhydrAMINE   (BENADRYL ) injection 50 mg  50 mg Intramuscular TID PRN Antoinetta Berrones E, NP       And   LORazepam  (ATIVAN ) injection 2 mg  2 mg Intramuscular TID PRN Fin Hupp E, NP       haloperidol  lactate (HALDOL ) injection 10 mg  10 mg Intramuscular TID PRN Waylen Depaolo E, NP       And   diphenhydrAMINE  (BENADRYL ) injection 50 mg  50 mg Intramuscular TID PRN Kimmy Parish E, NP       And   LORazepam  (ATIVAN ) injection 2 mg  2 mg Intramuscular TID PRN Sharise Lippy E, NP       divalproex  (DEPAKOTE ) DR tablet 500 mg  500 mg Oral Q12H Tmya Wigington E, NP   500 mg at 11/06/23 0940   magnesium  hydroxide (MILK OF MAGNESIA) suspension 30 mL  30 mL Oral Daily PRN Mcdonald Reiling E, NP       risperiDONE  (RISPERDAL ) tablet 0.5 mg  0.5 mg Oral BID Burdette Gergely E, NP   0.5 mg at 11/06/23 0941   Current Outpatient Medications  Medication Sig Dispense Refill   divalproex  (DEPAKOTE  ER) 500 MG 24 hr tablet Take 4 tablets (2,000 mg total) by mouth daily at 8 pm. 120 tablet 3   hydrOXYzine  (ATARAX ) 25 MG tablet Take 1 tablet (25 mg total) by mouth 3 (three) times daily as needed for anxiety. 30 tablet 3   ondansetron  (ZOFRAN ) 4 MG tablet Take 1 tablet (4 mg total) by mouth every 6 (six) hours. 12 tablet 0   risperiDONE  (RISPERDAL ) 3 MG tablet Take 2 tablets (6 mg total) by mouth at bedtime. 60 tablet 3   traZODone  (DESYREL ) 50 MG tablet Take 1 tablet (50 mg total) by mouth at bedtime as needed for sleep. 30 tablet 3    PTA Medications:  Facility Ordered Medications  Medication   acetaminophen  (TYLENOL ) tablet 650 mg   alum & mag hydroxide-simeth (MAALOX/MYLANTA) 200-200-20 MG/5ML suspension 30 mL   magnesium  hydroxide (MILK OF MAGNESIA) suspension 30 mL   haloperidol  (HALDOL ) tablet 5 mg   And   diphenhydrAMINE  (BENADRYL ) capsule 50 mg   haloperidol  lactate (HALDOL ) injection 5 mg   And   diphenhydrAMINE  (BENADRYL ) injection 50 mg   And   LORazepam  (ATIVAN ) injection 2 mg   haloperidol  lactate (HALDOL ) injection 10  mg   And   diphenhydrAMINE  (BENADRYL ) injection 50 mg   And   LORazepam  (ATIVAN ) injection 2 mg   risperiDONE  (RISPERDAL ) tablet 0.5 mg   divalproex  (DEPAKOTE ) DR tablet 500 mg   PTA Medications  Medication Sig   divalproex  (DEPAKOTE  ER) 500 MG 24 hr tablet Take 4 tablets (2,000 mg total) by mouth daily at 8 pm.   hydrOXYzine  (ATARAX ) 25 MG tablet Take 1 tablet (25 mg total) by mouth 3 (three) times daily as needed for anxiety.   risperiDONE  (RISPERDAL ) 3 MG tablet Take 2 tablets (6 mg total) by mouth at bedtime.   traZODone  (DESYREL ) 50 MG tablet Take 1 tablet (50 mg total) by mouth at bedtime as needed for  sleep.   ondansetron  (ZOFRAN ) 4 MG tablet Take 1 tablet (4 mg total) by mouth every 6 (six) hours.       08/02/2023   11:53 AM 10/06/2021   10:12 AM 10/06/2021    8:34 AM  Depression screen PHQ 2/9  Decreased Interest 0 0 1  Down, Depressed, Hopeless 0 0 3  PHQ - 2 Score 0 0 4  Altered sleeping 0 0 0  Tired, decreased energy 0 0 1  Change in appetite 0 0 1  Feeling bad or failure about yourself  0    Trouble concentrating 0 0 3  Moving slowly or fidgety/restless 0 0 3  Suicidal thoughts 0 0 0  PHQ-9 Score 0 0 12  Difficult doing work/chores Not difficult at all  Not difficult at all    [facility] from 11/05/2023 in [facility] ED from 10/01/2023 in [facility] at [facility] from 09/28/2023 in [facility] Department at [facility]  C-SSRS RISK CATEGORY No Risk No Risk No Risk    Musculoskeletal  Strength & Muscle Tone: not assessed - pt laying on lounger during this evaluation Gait & Station: not assessed - pt laying on lounger during this evaluation Patient leans: N/A  Psychiatric Specialty Exam  Presentation  General Appearance:  Fairly Groomed; Appropriate for Environment  Eye Contact: Good  Speech: Clear and Coherent; Normal Rate  Speech  Volume: Normal  Handedness: Right   Mood and Affect  Mood: Euthymic  Affect: Constricted   Thought Process  Thought Processes: Goal Directed  Descriptions of Associations:Intact  Orientation:Full (Time, Place and Person)  Thought Content:Logical  Diagnosis of Schizophrenia or Schizoaffective disorder in past: Yes    Hallucinations:Hallucinations: None  Ideas of Reference:None  Suicidal Thoughts:Suicidal Thoughts: No  Homicidal Thoughts:Homicidal Thoughts: No   Sensorium  Memory: Immediate Good; Recent Good  Judgment: Fair  Insight: Poor   Executive Functions  Concentration: Fair  Attention Span: Fair  Recall: Fair  Fund of Knowledge: Fair  Language: Good   Psychomotor Activity  Psychomotor Activity: Psychomotor Activity: Normal   Assets  Assets: Communication Skills; Resilience; Desire for Improvement   Sleep  Sleep: Sleep: Good  No Safety Checks orders active in given range  Nutritional Assessment (For OBS and FBC admissions only) Has the patient had a weight loss or gain of 10 pounds or more in the last 3 months?: No Has the patient had a decrease in food intake/or appetite?: No Does the patient have dental problems?: No Does the patient have eating habits or behaviors that may be indicators of an eating disorder including binging or inducing vomiting?: No Has the patient recently lost weight without trying?: 0 Has the patient been eating poorly because of a decreased appetite?: 0 Malnutrition Screening Tool Score: 0    Physical Exam  Physical Exam Vitals and nursing note reviewed.  HENT:     Head: Normocephalic.     Mouth/Throat:     Mouth: Mucous membranes are moist.  Cardiovascular:     Rate and Rhythm: Normal rate.  Pulmonary:     Effort: Pulmonary effort is normal.  Skin:    General: Skin is warm and dry.  Neurological:     Mental Status: He is alert.  Psychiatric:     Comments: See HPI    Review of  Systems  Constitutional:  Negative for fever.  HENT:  Negative for congestion.   Respiratory:  Negative for cough  and shortness of breath.   Cardiovascular:  Negative for chest pain and palpitations.  Psychiatric/Behavioral:  Negative for hallucinations and suicidal ideas.    Blood pressure 113/66, pulse 63, temperature 98 F (36.7 C), temperature source Oral, resp. rate 18, SpO2 99%. There is no height or weight on file to calculate BMI.   Disposition: Transfer to [facility]  Sherrell Culver, PMHNP-BC, FNP-BC  11/06/2023, 10:35 AM

## 2023-11-06 NOTE — Plan of Care (Signed)
   Problem: Education: Goal: Knowledge of [place] [facility] Education information/materials will improve Outcome: Progressing   Problem: Activity: Goal: Interest or engagement in activities will improve Outcome: Progressing   Problem: Coping: Goal: Ability to verbalize frustrations and anger appropriately will improve Outcome: Progressing   Problem: Safety: Goal: Periods of time without injury will increase Outcome: Progressing

## 2023-11-06 NOTE — Progress Notes (Signed)
   11/06/23 1500  Psych Admission Type (Psych Patients Only)  Admission Status Involuntary  Psychosocial Assessment  Patient Complaints Irritability;Confusion  Eye Contact Brief  Facial Expression Flat  Affect Flat  Speech Soft;Slow  Interaction Minimal  Motor Activity Slow  Appearance/Hygiene In scrubs  Behavior Characteristics Cooperative  Mood Pleasant;Preoccupied  Thought Process  Coherency WDL  Content WDL  Delusions None reported or observed  Perception WDL  Hallucination None reported or observed  Judgment WDL  Confusion WDL  Danger to Self  Current suicidal ideation? Denies  Agreement Not to Harm Self Yes  Description of Agreement verbal  Danger to Others  Danger to Others None reported or observed

## 2023-11-06 NOTE — ED Notes (Signed)
 Pt observed/assessed in recliner sleeping. RR even and unlabored, appearing in no noted distress. Environmental check complete, will continue to monitor for safety

## 2023-11-06 NOTE — Discharge Instructions (Addendum)
 Transfer to [facility]

## 2023-11-06 NOTE — Group Note (Signed)
 Date:  11/06/2023 Time:  8:35 PM  Group Topic/Focus:  Wrap-Up Group:   The focus of this group is to help patients review their daily goal of treatment and discuss progress on daily workbooks.    Participation Level:  Did Not Attend   Eva Vallee Dacosta 11/06/2023, 8:35 PM

## 2023-11-06 NOTE — B[facility] Group Notes (Addendum)
 LCSW Wellness Group Note   11/06/2023 1:00pm  Type of Group and Topic: Psychoeducational Group:  Wellness  Participation Level:  Active  Description of Group  Wellness group introduces the topic and its focus on developing healthy habits across the spectrum and its relationship to a decrease in hospital admissions.  Six areas of wellness are discussed: physical, social spiritual, intellectual, occupational, and emotional.  Patients are asked to consider their current wellness habits and to identify areas of wellness where they are interested and able to focus on improvements.    Therapeutic Goals Patients will understand components of wellness and how they can positively impact overall health.  Patients will identify areas of wellness where they have developed good habits. Patients will identify areas of wellness where they would like to make improvements.    Summary of Patient Progress: pt attentive in group, active in group discussion.  Pt identified physical and environmental as wellness areas of strength and social as wellness area that needs improvement.      Therapeutic Modalities: Cognitive Behavioral Therapy Psychoeducation    Bridget Cordella Simmonds, LCSW

## 2023-11-06 NOTE — Progress Notes (Signed)
 Admission Note: Patient is a 23 year old male admitted IVC for bizarre behavior/ altered mental status/ manic behavior. Patient has history of mental illness with diagnosis of schizoaffective disorder. Patient is alert and oriented with periods of slow speech and confusion. At times patient presents confused with flat affect. Patient stated,  I was fussing with my mom and sister over sum money when asked why he was here. Skin and personal belongings completed. Tattoos noted on right and left arms. Skin dry and intact. No contraband found. Routine safety check initiated. Patient oriented to the unit, staff and room. Offered fluids and snack, ate 100%. Patient safe on unit.

## 2023-11-06 NOTE — Plan of Care (Signed)
?  Problem: Education: ?Goal: Knowledge of [place] [facility] Education information/materials will improve ?Outcome: Progressing ?Goal: Emotional status will improve ?Outcome: Progressing ?  ?Problem: Safety: ?Goal: Periods of time without injury will increase ?Outcome: Progressing ?  ?

## 2023-11-07 MED ORDER — DIVALPROEX SODIUM 500 MG PO DR TAB
750.0000 mg | DELAYED_RELEASE_TABLET | Freq: Two times a day (BID) | ORAL | Status: DC
  Administered 2023-11-07 – 2023-11-11 (×8): 750 mg via ORAL
  Filled 2023-11-07 (×8): qty 1

## 2023-11-07 MED ORDER — PALIPERIDONE PALMITATE ER 78 MG/0.5ML IM SUSY
78.0000 mg | PREFILLED_SYRINGE | Freq: Once | INTRAMUSCULAR | Status: AC
  Administered 2023-11-07: 78 mg via INTRAMUSCULAR
  Filled 2023-11-07: qty 78

## 2023-11-07 MED ORDER — RISPERIDONE 3 MG PO TABS
3.0000 mg | ORAL_TABLET | Freq: Two times a day (BID) | ORAL | Status: DC
  Administered 2023-11-07 – 2023-11-09 (×4): 3 mg via ORAL
  Filled 2023-11-07 (×5): qty 1

## 2023-11-07 MED ORDER — PALIPERIDONE PALMITATE ER 156 MG/ML IM SUSY
156.0000 mg | PREFILLED_SYRINGE | Freq: Once | INTRAMUSCULAR | Status: AC
  Administered 2023-11-07: 156 mg via INTRAMUSCULAR

## 2023-11-07 MED ORDER — NICOTINE 21 MG/24HR TD PT24
21.0000 mg | MEDICATED_PATCH | Freq: Every day | TRANSDERMAL | Status: DC
  Administered 2023-11-07 – 2023-11-11 (×5): 21 mg via TRANSDERMAL
  Filled 2023-11-07 (×5): qty 1

## 2023-11-07 MED ORDER — PALIPERIDONE PALMITATE ER 78 MG/0.5ML IM SUSY
234.0000 mg | PREFILLED_SYRINGE | Freq: Once | INTRAMUSCULAR | Status: DC
  Filled 2023-11-07: qty 1

## 2023-11-07 NOTE — BHH Group Notes (Signed)
 Adult Psychoeducational Group Note  Date:  11/07/2023 Time:  7:47 PM  Group Topic/Focus:  Goals Group:   The focus of this group is to help patients establish daily goals to achieve during treatment and discuss how the patient can incorporate goal setting into their daily lives to aide in recovery. Orientation:   The focus of this group is to educate the patient on the purpose and policies of crisis stabilization and provide a format to answer questions about their admission.  The group details unit policies and expectations of patients while admitted.  Participation Level:  Did Not Attend  Participation Quality:    Affect:    Cognitive:    Insight:   Engagement in Group:    Modes of Intervention:    Additional Comments:    Jamelle Cassondra KIDD 11/07/2023, 7:47 PM

## 2023-11-07 NOTE — H&P (Signed)
 Psychiatric Admission Assessment Adult  Patient Identification: Jay Woods  MRN:  [id number]  Date of Evaluation:  11/07/23  Chief Complaint:  Schizoaffective disorder, bipolar type (HCC) [F25.0]   Principal Diagnosis: Schizoaffective disorder, bipolar type (HCC)  Diagnosis:  Principal Problem:   Schizoaffective disorder, bipolar type [facility])    Chief Complaint: My family IVC'd me for no reason   History of Present Illness: Jay Woods is a 23 y.o. who  has a past medical history of Eczema and Schizoaffective disorder (HCC).  He presented to [facility] for Schizoaffective disorder, bipolar type (HCC).  He presented due to decompensation in the setting of medication noncompliance.  He appears to be a poor and unreliable historian.  On my exam the patient has poor insight into the factors leading to his hospitalization.  He is aware of his diagnosis, but does not believe that he needs medications for it.  Psychoeducation and supportive psychotherapy were provided.  We discussed starting a long-acting injectable to help improve compliance.  He was amenable to this plan.  He was admitted on 0.5 mg of Risperdal  twice daily and Depakote  500 mg twice daily.  Will increase Risperdal  to 3 mg twice daily and increase Depakote  to 750 mg twice daily.  Pharmacy did not have any 234 mg Invega  Sustenna shots, so the patient was given 2 injections 1 of Invega  78 mg and one of 156 mg.  He will need a second injection in 4 to 7 days.   Past Psychiatric History: He  has a past medical history of Eczema and Schizoaffective disorder (HCC).   Is the patient at risk to self?  Yes Has the patient been a risk to self in the past 6 months? No Has the patient been a risk to self within the distant past? No Is the patient a risk to others? No Has the patient been a risk to others in the past 6 months? No Has the patient been a risk to others within the distant past? No  [place] Scale:  Flowsheet Row  [facility]) [facility] from [facility]2023 [facility] from 10/01/2023 in [facility] Emergency Department at [facility]  C-SSRS RISK CATEGORY No Risk No Risk No Risk       Prior Inpatient Therapy: Multiple admits Prior Outpatient Therapy: Yes  Alcohol Screening:  Patient refused Alcohol Screening Tool: Yes 1. How often do you have a drink containing alcohol?: Never 2. How many drinks containing alcohol do you have on a typical day when you are drinking?: 1 or 2 3. How often do you have six or more drinks on one occasion?: Never AUDIT-C Score: 0 4. How often during the last year have you found that you were not able to stop drinking once you had started?: Never 5. How often during the last year have you failed to do what was normally expected from you because of drinking?: Never 6. How often during the last year have you needed a first drink in the morning to get yourself going after a heavy drinking session?: Never 7. How often during the last year have you had a feeling of guilt of remorse after drinking?: Never 8. How often during the last year have you been unable to remember what happened the night before because you had been drinking?: Never 9. Have you or someone else been injured as a result of your drinking?: No 10. Has a relative or friend  or a doctor or another health worker been concerned about your drinking or suggested you cut down?: No Alcohol Use Disorder Identification Test Final Score (AUDIT): 0 Alcohol Brief Interventions/Follow-up: Patient Refused  Substance Abuse History in the last 12 months: Denies Consequences of Substance Abuse: NA  Previous Psychotropic Medications: Yes Psychological Evaluations: No  Past Medical History:  Past Medical History:  Diagnosis Date   Eczema    Schizoaffective disorder [facility])      Family Psychiatric & Medical History:  Family  History  Problem Relation Age of Onset   Healthy Mother    Healthy Father      Tobacco Screening:  Social History   Tobacco Use  Smoking Status Former   Current packs/day: 0.25   Average packs/day: 0.3 packs/day for 5.0 years (1.3 ttl pk-yrs)   Types: Cigarettes  Smokeless Tobacco Former      Social History:  Social History   Substance and Sexual Activity  Alcohol Use Not Currently   Alcohol/week: 1.0 standard drink of alcohol   Types: 1 Cans of beer per week   Comment: last use 09/30/21      Additional Social History: Marital status: Long term relationship Long term relationship, how long?: I am not sure What types of issues is patient dealing with in the relationship?: Baby mama refuses for him to visit with his 2yo son if he is not on his medication.  There are currently charges on him for taking his son without her permission. Additional relationship information: He also states that he has not been keeping his word with baby mama, which causes issues between them.  There has been infidelity on both sides. Are you sexually active?: Yes What is your sexual orientation?: Heterosexual Has your sexual activity been affected by drugs, alcohol, medication, or emotional stress?: No Does patient have children?: Yes How many children?: 1 How is patient's relationship with their children?: 2yo son - loves his son, but does not get to spend much time with him due to the mother not wanting him to have interactions with son unless he is stable on his medication; he recently took the child without her permission and is now facing charges, states he has a court date on 11/18/23.     Allergies:  No Known Allergies   Lab Results:  Results for orders placed or performed during the hospital encounter of 11/05/23 (from the past 48 hours)  CBC with Differential/Platelet     Status: None   Collection Time: 11/05/23  6:03 PM  Result Value Ref Range   WBC 8.3 4.0 - 10.5 K/uL   RBC 4.86  4.22 - 5.81 MIL/uL   Hemoglobin 15.1 13.0 - 17.0 g/dL   HCT 54.6 60.9 - 47.9 %   MCV 93.2 80.0 - 100.0 fL   MCH 31.1 26.0 - 34.0 pg   MCHC 33.3 30.0 - 36.0 g/dL   RDW 87.0 88.4 - 84.4 %   Platelets 270 150 - 400 K/uL   nRBC 0.0 0.0 - 0.2 %   Neutrophils Relative % 66 %   Neutro Abs 5.5 1.7 - 7.7 K/uL   Lymphocytes Relative 24 %   Lymphs Abs 2.0 0.7 - 4.0 K/uL   Monocytes Relative 8 %   Monocytes Absolute 0.7 0.1 - 1.0 K/uL   Eosinophils Relative 1 %   Eosinophils Absolute 0.1 0.0 - 0.5 K/uL   Basophils Relative 1 %   Basophils Absolute 0.1 0.0 - 0.1 K/uL   Immature Granulocytes 0 %  Abs Immature Granulocytes 0.03 0.00 - 0.07 K/uL    Comment: Performed at [facility], [address] [place], [place] [address]  Comprehensive metabolic panel     Status: Abnormal   Collection Time: 11/05/23  6:03 PM  Result Value Ref Range   Sodium 137 135 - 145 mmol/L   Potassium 3.4 (L) 3.5 - 5.1 mmol/L   Chloride 102 98 - 111 mmol/L   CO2 20 (L) 22 - 32 mmol/L   Glucose, Bld 88 70 - 99 mg/dL    Comment: Glucose reference range applies only to samples taken after fasting for at least 8 hours.   BUN 10 6 - 20 mg/dL   Creatinine, Ser 9.21 0.61 - 1.24 mg/dL   Calcium 9.2 8.9 - 89.6 mg/dL   Total Protein 8.5 (H) 6.5 - 8.1 g/dL   Albumin 4.6 3.5 - 5.0 g/dL   AST 23 15 - 41 U/L   ALT 29 0 - 44 U/L   Alkaline Phosphatase 38 38 - 126 U/L   Total Bilirubin 0.7 0.0 - 1.2 mg/dL   GFR, Estimated >39 >39 mL/min    Comment: (NOTE) Calculated using the CKD-EPI Creatinine Equation (2021)    Anion gap 15 5 - 15    Comment: Performed at [facility], [address], [place], [place] [address]  Hemoglobin A1c     Status: None   Collection Time: 11/05/23  6:03 PM  Result Value Ref Range   Hgb A1c MFr Bld 5.0 4.8 - 5.6 %    Comment: (NOTE) Diagnosis of Diabetes The following HbA1c ranges recommended by the American Diabetes Association (ADA) may be used as an aid in the diagnosis of  diabetes mellitus.  Hemoglobin             Suggested A1C NGSP%              Diagnosis  <5.7                   Non Diabetic  5.7-6.4                Pre-Diabetic  >6.4                   Diabetic  <7.0                   Glycemic control for                       adults with diabetes.     Mean Plasma Glucose 96.8 mg/dL    Comment: Performed at [facility], [address], [place], [place] [address]  Lipid panel     Status: None   Collection Time: 11/05/23  6:03 PM  Result Value Ref Range   Cholesterol 167 0 - 200 mg/dL   Triglycerides 72 <849 mg/dL   HDL 59 >59 mg/dL   Total CHOL/HDL Ratio 2.8 RATIO   VLDL 14 0 - 40 mg/dL   LDL Cholesterol 94 0 - 99 mg/dL    Comment:        Total Cholesterol/HDL:CHD Risk Coronary Heart Disease Risk Table                     Men   Women  1/2 Average Risk   3.4   3.3  Average Risk       5.0   4.4  2 X Average Risk  9.6   7.1  3 X Average Risk  23.4   11.0        Use the calculated Patient Ratio above and the CHD Risk Table to determine the patient's CHD Risk.        ATP III CLASSIFICATION (LDL):  <100     mg/dL   Optimal  899-870  mg/dL   Near or Above                    Optimal  130-159  mg/dL   Borderline  839-810  mg/dL   High  >809     mg/dL   Very High Performed at [facility], [address] [place], [place] [address]   Urinalysis, Routine w reflex microscopic -Urine, Clean Catch     Status: Abnormal   Collection Time: 11/05/23  6:03 PM  Result Value Ref Range   Color, Urine YELLOW YELLOW   APPearance HAZY (A) CLEAR   Specific Gravity, Urine 1.021 1.005 - 1.030   pH 6.0 5.0 - 8.0   Glucose, UA NEGATIVE NEGATIVE mg/dL   Hgb urine dipstick NEGATIVE NEGATIVE   Bilirubin Urine NEGATIVE NEGATIVE   Ketones, ur NEGATIVE NEGATIVE mg/dL   Protein, ur NEGATIVE NEGATIVE mg/dL   Nitrite NEGATIVE NEGATIVE   Leukocytes,Ua NEGATIVE NEGATIVE    Comment: Performed at [facility], [address] [place], [place]  [address]  Valproic acid  level     Status: Abnormal   Collection Time: 11/05/23  6:03 PM  Result Value Ref Range   Valproic Acid  Lvl <10 (L) 50 - 100 ug/mL    Comment: RESULTS CONFIRMED BY MANUAL DILUTION Performed at [facility], [address] [place], [place] [address]   POCT Urine Drug Screen - (I-Screen)     Status: Abnormal   Collection Time: 11/06/23  8:32 AM  Result Value Ref Range   POC Amphetamine UR None Detected NONE DETECTED (Cut Off Level 1000 ng/mL)   POC Secobarbital (BAR) None Detected NONE DETECTED (Cut Off Level 300 ng/mL)   POC Buprenorphine (BUP) None Detected NONE DETECTED (Cut Off Level 10 ng/mL)   POC Oxazepam (BZO) None Detected NONE DETECTED (Cut Off Level 300 ng/mL)   POC Cocaine UR None Detected NONE DETECTED (Cut Off Level 300 ng/mL)   POC Methamphetamine UR None Detected NONE DETECTED (Cut Off Level 1000 ng/mL)   POC Morphine None Detected NONE DETECTED (Cut Off Level 300 ng/mL)   POC Methadone UR None Detected NONE DETECTED (Cut Off Level 300 ng/mL)   POC Oxycodone UR None Detected NONE DETECTED (Cut Off Level 100 ng/mL)   POC Marijuana UR Positive (A) NONE DETECTED (Cut Off Level 50 ng/mL)     Blood Alcohol level:  Lab Results  Component Value Date   [address] [employer] <15 07/10/2023   ETH <10 09/24/2021    Metabolic Disorder Labs:  Lab Results  Component Value Date   HGBA1C 5.0 11/05/2023   MPG 96.8 11/05/2023   MPG 96.8 07/12/2023   Lab Results  Component Value Date   PROLACTIN 4.4 06/09/2020    Lab Results  Component Value Date   CHOL 167 11/05/2023   TRIG 72 11/05/2023   HDL 59 11/05/2023   VLDL 14 11/05/2023   LDLCALC 94 11/05/2023   LDLCALC 72 07/12/2023      Current Medications: Current Facility-Administered Medications  Medication Dose Route Frequency Provider Last Rate Last Admin   acetaminophen  (TYLENOL ) tablet 650 mg  650  mg Oral Q6H PRN Hobson, Fran E, NP       alum & mag hydroxide-simeth (MAALOX/MYLANTA) 200-200-20 MG/5ML  suspension 30 mL  30 mL Oral Q4H PRN Hobson, Fran E, NP       haloperidol  (HALDOL ) tablet 5 mg  5 mg Oral TID PRN Hobson, Fran E, NP       And   diphenhydrAMINE  (BENADRYL ) capsule 50 mg  50 mg Oral TID PRN Hobson, Fran E, NP       haloperidol  lactate (HALDOL ) injection 5 mg  5 mg Intramuscular TID PRN Hobson, Fran E, NP       And   diphenhydrAMINE  (BENADRYL ) injection 50 mg  50 mg Intramuscular TID PRN Hobson, Fran E, NP       And   LORazepam  (ATIVAN ) injection 2 mg  2 mg Intramuscular TID PRN Hobson, Fran E, NP       haloperidol  lactate (HALDOL ) injection 10 mg  10 mg Intramuscular TID PRN Hobson, Fran E, NP       And   diphenhydrAMINE  (BENADRYL ) injection 50 mg  50 mg Intramuscular TID PRN Hobson, Fran E, NP       And   LORazepam  (ATIVAN ) injection 2 mg  2 mg Intramuscular TID PRN Hobson, Fran E, NP       divalproex  (DEPAKOTE ) DR tablet 750 mg  750 mg Oral Q12H Danitza Schoenfeldt S, MD       magnesium  hydroxide (MILK OF MAGNESIA) suspension 30 mL  30 mL Oral Daily PRN Hobson, Fran E, NP       nicotine  (NICODERM CQ  - dosed in mg/24 hours) patch 21 mg  21 mg Transdermal Daily Colette Dicamillo S, MD       risperiDONE  (RISPERDAL ) tablet 3 mg  3 mg Oral BID Kennyth Starleen RAMAN, MD       traZODone  (DESYREL ) tablet 50 mg  50 mg Oral QHS PRN Hobson, Fran E, NP   50 mg at 11/06/23 1954    PTA Medications: No medications prior to admission.     Musculoskeletal: Strength & Muscle Tone: within normal limits Gait & Station: normal Patient leans: N/A    Psychiatric Specialty Exam:  Presentation  General Appearance: Disheveled  Eye Contact: Fair  Speech: Blocked  Speech Volume: Normal  Handedness: Right   Mood and Affect  Mood: Dysphoric  Affect: Restricted; Depressed   Thought Process  Thought Processes: Disorganized  Descriptions of Associations: Tangential  Orientation: Partial  Thought Content: Delusions; Tangential  History of Schizophrenia/Schizoaffective disorder:  Yes  Duration of Psychotic Symptoms: NA Hallucinations: Hallucinations: Auditory  Ideas of Reference: None  Suicidal Thoughts: Suicidal Thoughts: No  Homicidal Thoughts: Homicidal Thoughts: No   Sensorium  Memory: Immediate Good  Judgment: Poor  Insight: Poor   Executive Functions  Concentration: Fair  Attention Span: Fair  Recall: Good  Fund of Knowledge: Good  Language: Good   Psychomotor Activity  Psychomotor Activity: Psychomotor Activity: Normal   Assets  Assets: Communication Skills; Desire for Improvement   Sleep  Sleep: Sleep: Fair    Physical Exam: General: Sitting comfortably. NAD. HEENT: Normocephalic, atraumatic, MMM, EMOI Lungs: no increased work of breathing noted Heart: no cyanosis Abdomen: Non distended Musculoskeletal: FROM. No obvious deformities Skin: Warm, dry, intact. No rashes noted Neuro: No obvious focal deficits.  Gait and station are normal  Review of Systems  Constitutional: Negative.   HENT: Negative.    Eyes: Negative.   Respiratory: Negative.    Cardiovascular: Negative.   Gastrointestinal:  Negative.   Genitourinary: Negative.   Skin: Negative.   Neurological: Negative.   Psychiatric/Behavioral:  Positive for psychosis.     Blood pressure (!) 130/96, pulse 73, temperature 97.8 F (36.6 C), temperature source Oral, resp. rate 18, height 5' 9 (1.753 m), weight 68.5 kg, SpO2 99%. Body mass index is 22.3 kg/m.   Treatment Plan Summary: ASSESSMENT: Jay Woods is an 23 y.o. male who  has a past medical history of Eczema and Schizoaffective disorder (HCC).  He presented on 11/06/2023  3:04 PM for Schizoaffective disorder, bipolar type (HCC).    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) 09/26/2021    Methamphetamine use disorder, mild, in early remission (HCC) 09/26/2021   Nicotine  use disorder 09/26/2021   Substance induced mood disorder (HCC) 02/28/2017   Intermittent explosive disorder 02/27/2017   Cannabis use disorder, severe, in early remission (HCC) 02/27/2017     PLAN: Safety and Monitoring:  -- Involuntary admission to inpatient psychiatric unit for safety, stabilization and treatment  -- Daily contact with patient to assess and evaluate symptoms and progress in treatment  -- Patient's case to be discussed in multi-disciplinary team meeting  -- Observation Level : q15 minute checks  -- Vital signs:  q12 hours  -- Precautions: suicide, elopement, and assault  2. 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) 09/26/2021   Methamphetamine use disorder, mild, in early remission (HCC) 09/26/2021   Nicotine  use disorder 09/26/2021   Substance induced mood disorder (HCC) 02/28/2017   Intermittent explosive disorder 02/27/2017   Cannabis use disorder, severe, in early remission (HCC) 02/27/2017     Scheduled Medications:  divalproex   750 mg Oral Q12H   nicotine   21 mg Transdermal Daily   risperiDONE   3 mg Oral BID     As Needed Medications: acetaminophen , alum & mag hydroxide-simeth, haloperidol  **AND** diphenhydrAMINE , haloperidol  lactate **AND** diphenhydrAMINE  **AND** LORazepam , haloperidol  lactate **AND** diphenhydrAMINE  **AND** LORazepam , magnesium  hydroxide, traZODone     3. Medical Issues Being Addressed:    Labs reviewed, u mostly unremarkable   Tobacco Use Disorder  -- Nicotine  replacement as above  -- Smoking cessation encouraged  4. Discharge Planning:   -- Social work and case management to assist with discharge planning and identification of hospital  follow-up needs prior to discharge  -- Estimated LOS: 7 to 10 days  -- Discharge Concerns: Need to establish a safety plan; Medication compliance and effectiveness  -- Discharge Goals: Return home with outpatient referrals for mental health follow-up including medication management/psychotherapy  5. Short Term Goals:  Improve ability to identify changes in lifestyle to reduce recurrence of condition, verbalize feelings, disclose and discuss suicidal ideas, demonstrate self-control, identify and develop effective coping behaviors, compliance with prescribed medications, identify triggers associated with substance abuse/mental health issues, participate in unit milieu and in scheduled group therapies   6. Long Term Goals: Improvement in symptoms so the patient is ready for discharge   --The risks/benefits/side-effects/alternatives to the medications above were discussed in detail with the patient and time was given for questions. The patient provided informed consent.   -- Metabolic profile and EKG monitoring obtained while on an atypical antipsychotic and  listed in the EHR    Total Time Spent in Direct Patient Care:  I personally spent 60 minutes on the unit in direct patient care. The direct patient care time included face-to-face time with the patient, reviewing the patient's chart, communicating with other professionals, and coordinating care. Greater than 50% of this time was spent in counseling or coordinating care with the patient regarding goals of hospitalization, psycho-education, and discharge planning needs.   I certify that inpatient services furnished can reasonably be expected to improve the patient's condition.    Glendia Kitty, MD 11/07/2023, 3:06 PM      Portions of this note were created using voice recognition software. Minor syntax errors, grammatical content, spelling, or punctuation errors may have occurred unintentionally. Please notify the dino if the meaning of any  statement is unclear.

## 2023-11-07 NOTE — Progress Notes (Signed)
   11/06/23 2100  Psych Admission Type (Psych Patients Only)  Admission Status Involuntary  Psychosocial Assessment  Patient Complaints Anxiety  Eye Contact Brief  Facial Expression Anxious;Sad  Affect Preoccupied  Speech Logical/coherent  Interaction Minimal  Motor Activity Slow  Appearance/Hygiene Unremarkable  Behavior Characteristics Cooperative;Appropriate to situation  Mood Pleasant  Thought Process  Coherency WDL  Content WDL  Delusions None reported or observed  Perception WDL  Hallucination None reported or observed  Judgment WDL  Confusion WDL  Danger to Self  Current suicidal ideation? Denies  Description of Suicide Plan none  Agreement Not to Harm Self Yes  Description of Agreement verbal  Danger to Others  Danger to Others None reported or observed

## 2023-11-07 NOTE — Plan of Care (Signed)
   Problem: Education: Goal: Emotional status will improve Outcome: Progressing Goal: Mental status will improve Outcome: Progressing   Problem: Activity: Goal: Interest or engagement in activities will improve Outcome: Progressing Goal: Sleeping patterns will improve Outcome: Progressing   Problem: Safety: Goal: Periods of time without injury will increase Outcome: Progressing

## 2023-11-07 NOTE — BHH Suicide Risk Assessment (Signed)
 [facility] Admission Suicide Risk Assessment   Nursing information obtained from:  Patient Demographic factors:  Male, Low socioeconomic status Current Mental Status:  Thoughts of violence towards others Loss Factors:  Financial problems / change in socioeconomic status Historical Factors:  NA Risk Reduction Factors:  Sense of responsibility to family, Responsible for children under 25 years of age  Total Time spent with patient: 45 minutes Principal Problem: Schizoaffective disorder, bipolar type (HCC) Diagnosis:  Principal Problem:   Schizoaffective disorder, bipolar type (HCC)  Subjective Data: Strummer Artica-Limones is a 23 y.o. male who has a past medical history of Eczema and Schizoaffective disorder (HCC). He presented from an outside hospital for Schizoaffective disorder, bipolar type (HCC) [F25.0].   Continued Clinical Symptoms:  Alcohol Use Disorder Identification Test Final Score (AUDIT): 0 The Alcohol Use Disorders Identification Test, Guidelines for Use in Primary Care, Second Edition.  World Science writer [facility]). Score between 0-7:  no or low risk or alcohol related problems. Score between 8-15:  moderate risk of alcohol related problems. Score between 16-19:  high risk of alcohol related problems. Score 20 or above:  warrants further diagnostic evaluation for alcohol dependence and treatment.   CLINICAL FACTORS:   Schizophrenia:   Less than 41 years old   Musculoskeletal: Strength & Muscle Tone: within normal limits Gait & Station: normal Patient leans: N/A  Psychiatric Specialty Exam:  Presentation  General Appearance:  Disheveled  Eye Contact: Fair  Speech: Blocked  Speech Volume: Normal  Handedness: Right   Mood and Affect  Mood: Dysphoric  Affect: Restricted; Depressed   Thought Process  Thought Processes: Disorganized  Descriptions of Associations:Tangential  Orientation:Partial  Thought Content:Delusions; Tangential  History  of Schizophrenia/Schizoaffective disorder:Yes  Duration of Psychotic Symptoms:Greater than six months  Hallucinations:Hallucinations: Auditory  Ideas of Reference:None  Suicidal Thoughts:Suicidal Thoughts: No  Homicidal Thoughts:Homicidal Thoughts: No   Sensorium  Memory: Immediate Good  Judgment: Poor  Insight: Poor   Executive Functions  Concentration: Fair  Attention Span: Fair  Recall: Good  Fund of Knowledge: Good  Language: Good   Psychomotor Activity  Psychomotor Activity: Psychomotor Activity: Normal   Assets  Assets: Communication Skills; Desire for Improvement   Sleep  Sleep: Sleep: Fair    Physical Exam: Physical Exam ROS Blood pressure (!) 130/96, pulse 73, temperature 97.8 F (36.6 C), temperature source Oral, resp. rate 18, height 5' 9 (1.753 m), weight 68.5 kg, SpO2 99%. Body mass index is 22.3 kg/m.   COGNITIVE FEATURES THAT CONTRIBUTE TO RISK:  Closed-mindedness    SUICIDE RISK:   Moderate:  Frequent suicidal ideation with limited intensity, and duration, some specificity in terms of plans, no associated intent, good self-control, limited dysphoria/symptomatology, some risk factors present, and identifiable protective factors, including available and accessible social support.  PLAN OF CARE: See H&P  I certify that inpatient services furnished can reasonably be expected to improve the patient's condition.   Starleen GORMAN Kitty, MD 11/07/2023, 3:00 PM

## 2023-11-07 NOTE — Progress Notes (Signed)
   11/07/23 1038  Psych Admission Type (Psych Patients Only)  Admission Status Involuntary  Psychosocial Assessment  Patient Complaints Anxiety  Eye Contact Avoids  Facial Expression Anxious  Affect Preoccupied  Speech Logical/coherent  Interaction Avoidant  Motor Activity Slow  Appearance/Hygiene Unremarkable  Behavior Characteristics Cooperative  Mood Anxious  Thought Process  Coherency WDL  Content WDL  Delusions None reported or observed  Perception WDL  Hallucination None reported or observed  Judgment WDL  Confusion None  Danger to Self  Current suicidal ideation? Denies  Agreement Not to Harm Self Yes  Description of Agreement Verbal  Danger to Others  Danger to Others None reported or observed

## 2023-11-07 NOTE — Group Note (Signed)
 Date:  11/07/2023 Time:  8:23 PM  Group Topic/Focus:  Wrap-Up Group:   The focus of this group is to help patients review their daily goal of treatment and discuss progress on daily workbooks.    Participation Level:  Active  Participation Quality:  Appropriate  Affect:  Appropriate  Cognitive:  Appropriate  Insight: Appropriate  Engagement in Group:  Engaged  Modes of Intervention:  Education and Exploration  Additional Comments:  Patient attended and participated in group tonight. He reports that his goal today was to eat, shower and sleep.  He did meet his goal today.  Gwenn Chillington Dacosta 11/07/2023, 8:23 PM

## 2023-11-07 NOTE — Plan of Care (Signed)
   Problem: Education: Goal: Knowledge of [place] [facility] Education information/materials will improve Outcome: Progressing Goal: Emotional status will improve Outcome: Progressing Goal: Mental status will improve Outcome: Progressing Goal: Verbalization of understanding the information provided will improve Outcome: Progressing
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
## 2023-11-07 NOTE — Progress Notes (Signed)
(  Sleep Hours) -10.25  (Any PRNs that were needed, meds refused, or side effects to meds)- Trazodone  50 mg  (Any disturbances and when (visitation, over night)- N/A  (Concerns raised by the patient)- none  (SI/HI/AVH)-denies

## 2023-11-07 NOTE — Progress Notes (Signed)
(  Sleep Hours) - 9 (Any PRNs that were needed, meds refused, or side effects to meds)- trazodone  (Any disturbances and when (visitation, over night)-none (Concerns raised by the patient)- none (SI/HI/AVH)- denies all

## 2023-11-08 ENCOUNTER — Encounter (HOSPITAL_COMMUNITY): Payer: Self-pay

## 2023-11-08 LAB — VITAMIN B12: Vitamin B-12: 442 pg/mL (ref 180–914)

## 2023-11-08 LAB — HIV ANTIBODY (ROUTINE TESTING W REFLEX): HIV Screen 4th Generation wRfx: NONREACTIVE

## 2023-11-08 LAB — RPR: RPR Ser Ql: NONREACTIVE

## 2023-11-08 LAB — VITAMIN D 25 HYDROXY (VIT D DEFICIENCY, FRACTURES): Vit D, 25-Hydroxy: 32.82 ng/mL (ref 30–100)

## 2023-11-08 LAB — FOLATE: Folate: 13.6 ng/mL (ref >5.9)

## 2023-11-08 NOTE — Progress Notes (Signed)
 Recreation Therapy Notes  INPATIENT RECREATION THERAPY ASSESSMENT  Patient Details Name: Jay Woods MRN: [id number] DOB: Feb 19, 2001 Today's Date: 11/08/2023       Information Obtained From: Patient  Able to Participate in Assessment/Interview: Yes  Patient Presentation: Alert (Pt had delayed responses and would ramble at times during assessment.)  Reason for Admission (Per Patient): Med Non-Compliance  Patient Stressors: Other (Comment) (not having money and now being in debt with his mother, owing her 3,000 dollars.)  Coping Skills:   TV, Substance Abuse, Talk, Read, Hot Bath/Shower, Sports (Pt stated his basketball was flat so he plays soccer. Pt then started rambling about his brother playing soccer and not wanting his brother to feel he was trying to be better than him. Pt also stated he didn't want his brother to stop playing soccer.)  Leisure Interests (2+):  Individual - Other (Comment) (doing push-ups/6 inches; Eat)  Frequency of Recreation/Participation: Other (Comment) (Push-ups/6 inches- Not since being in prison; Eat- Daily)  Awareness of Community Resources:  Yes  Community Resources:  Gilliam, Public affairs consultant, Research scientist (physical sciences)  Current Use: No  If no, Barriers?: Surveyor, quantity  Expressed Interest in [employer] Information: No  [employer] of Residence:  Engineer, technical sales  Patient Main Form of Transportation: Walk  Patient Strengths:  Learning music, Moving fast paced  Patient Identified Areas of Improvement:  Lying, Working out  Patient Goal for Hospitalization:  continue taking shots (medication)  Current SI (including self-harm):  No  Current HI:  No  Current AVH: No  Staff Intervention Plan: Group Attendance, Collaborate with Interdisciplinary Treatment Team  Consent to Intern Participation: N/A   Titus Drone-McCall, LRT,CTRS Hayat [place]ton A Italia Wolfert-McCall 11/08/2023, 1:55 PM

## 2023-11-08 NOTE — Group Note (Unsigned)
 Date:  11/08/2023 Time:  8:29 PM  Group Topic/Focus:  Wrap-Up Group:   The focus of this group is to help patients review their daily goal of treatment and discuss progress on daily workbooks.     Participation Level:  {BHH PARTICIPATION OZCZO:77735}  Participation Quality:  {BHH PARTICIPATION QUALITY:22265}  Affect:  {BHH AFFECT:22266}  Cognitive:  {BHH COGNITIVE:22267}  Insight: {BHH Insight2:20797}  Engagement in Group:  {BHH ENGAGEMENT IN HMNLE:77731}  Modes of Intervention:  {BHH MODES OF INTERVENTION:22269}  Additional Comments:  ***  Jenean Escandon Dacosta 11/08/2023, 8:29 PM

## 2023-11-08 NOTE — Group Note (Signed)
 Date:  11/08/2023 Time:  8:21 PM  Group Topic/Focus:  Wrap-Up Group:   The focus of this group is to help patients review their daily goal of treatment and discuss progress on daily workbooks.    Participation Level:  Did Not Attend   Marck Mcclenny Dacosta 11/08/2023, 8:21 PM

## 2023-11-08 NOTE — Group Note (Signed)
 Date:  11/08/2023 Time:  8:31 PM  Group Topic/Focus:  Wrap-Up Group:   The focus of this group is to help patients review their daily goal of treatment and discuss progress on daily workbooks.    Participation Level:  Did Not Attend   Trisha Morandi Dacosta 11/08/2023, 8:31 PM

## 2023-11-08 NOTE — Progress Notes (Signed)
 [facility] Inpatient Psychiatry Progress Note  Date: 11/08/23 Patient: Jay Woods MRN: [id number]  Assessment and Plan: Clayton Artica-Limones is a 23 y.o. male admitted for decompensated psychosis in the setting of noncompliance with his prescribed medications. He was petitioned by his sister who reports prior to admission he was neglecting his hygiene, exhibiting aggression, banging on neighbors' doors, and acting erratically.     # Schizoaffective disorder, bipolar type (HCC) - Depakote  750 mg q12h - risperidone  3 mg BID - Invega  Sustenna 234 mg IM administered 9/14   Risk Assessment - Moderate  Discharge Planning Barriers to discharge: Active psychosis  Estimated length of stay: 4-7 days Predicted Discharge location: Home     Interval History and update: Chart reviewed. No significant interval events. Patient received Sustenna yesterday and has not been endorsing any significant side effects. He slept well last night. ON assessment today he was pleasant but appeared guarded. He reported that his goal for the hospitalization is to work on my medicine. He generally denied any symptoms however, and was uncertain exactly why he was admitted. He denied AVH or SI. Staff report that he has been flat and guarded.       Physical Exam MSK/Neuro - Normal gait and station Mental Status Exam Appearance - Casually dressed, appropriate hygiene Attitude - Calm, polite, not overtly guarded Speech - normal volume, prosody, inflection Mood - Fine Affect - mildly restricted Thought Process - GD Thought Content - No clear delusions expressed SI/HI - Denies Perceptions - Denies; not observed RIS Judgement/Insight - Poor Fund of knowledge - WNL Language - No impairments      Lab Results:  Admission on 11/06/2023  Component Date Value Ref Range Status   Folate 11/08/2023 13.6  >5.9 ng/mL Final   HIV Screen 4th Generation wRfx 11/08/2023 Non Reactive   Non Reactive Final   RPR Ser Ql 11/08/2023 NON REACTIVE  NON REACTIVE Final   Vit D, 25-Hydroxy 11/08/2023 32.82  30 - 100 ng/mL Final   Vitamin B-12 11/08/2023 442  180 - 914 pg/mL Final     Vitals: Blood pressure 122/74, pulse 68, temperature 97.8 F (36.6 C), temperature source Oral, resp. rate 18, height 5' 9 (1.753 m), weight 68.5 kg, SpO2 100%.    Oliva DELENA Salmon, DO

## 2023-11-08 NOTE — Group Note (Signed)
 LCSW Group Therapy Note   Group Date: 11/08/2023 Start Time: 1100 End Time: 1200   Participation:  patient was present and actively participated in the discussion.   Type of Therapy:  Group Therapy  Topic:  Healing From Within: Understanding Our Past, Building Our Future"  Objective:  To help participants understand the impact of early experiences on mental and physical health, with a focus on Adverse Childhood Experiences (ACEs), and to explore ways to build resilience and healing.  Summary: In today's session, we discussed how early experiences, especially ACEs, impact mental and physical health.  We explored the effects of stress, abuse, and neglect on brain development and well-being. The group focused on resilience, understanding that healing and positive change are possible with support and self-awareness.  Group Goals: Understand ACEs and Their Impact: Learn how childhood experiences shape mental and physical health. Build Resilience: Develop strategies for overcoming challenges and creating positive change. Promote Healing: Recognize the value of support and the possibility of healing through therapy and self-care.  Therapeutic Modalities Used: Psychoeducation: Sharing information about ACEs and their effects. Cognitive Behavioral Therapy (CBT): Helping reframe negative thought patterns. Trauma-Informed Therapy: Creating a safe, supportive space for healing.   Mattson Dayal O Aiden Rao, LCSWA 11/08/2023  4:08 PM

## 2023-11-08 NOTE — Group Note (Signed)
 Recreation Therapy Group Note   Group Topic:Communication  Group Date: 11/08/2023 Start Time: 1025 End Time: 1053 Facilitators: Izaia Say-McCall, LRT,CTRS Location: [address] Dayroom   Group Topic: Communication, Team Building, Problem Solving  Goal Area(s) Addresses:  Patient will effectively work with peer towards shared goal.  Patient will identify skills used to make activity successful.  Patient will identify how skills used during activity can be applied to reach post d/c goals.   Behavioral Response: Engaged  Intervention: STEM Activity- Glass blower/designer  Activity: Tallest [employer]. In teams of 5-6, patients were given 11 craft pipe cleaners. Using the materials provided, patients were instructed to compete again the opposing team(s) to build the tallest free-standing structure from floor level. The activity was timed; difficulty increased by Clinical research associate as Production designer, theatre/television/film continued.  Systematically resources were removed with additional directions for example, placing one arm behind their back, working in silence, and shape stipulations. LRT facilitated post-activity discussion reviewing team processes and necessary communication skills involved in completion. Patients were encouraged to reflect how the skills utilized, or not utilized, in this activity can be incorporated to positively impact support systems post discharge.  Education: Pharmacist, community, Scientist, physiological, Discharge Planning   Education Outcome: Acknowledges education/In group clarification offered/Needs additional education.    Affect/Mood: Appropriate   Participation Level: Engaged   Participation Quality: Independent   Behavior: Appropriate   Speech/Thought Process: Focused   Insight: Fair   Judgement: Fair    Modes of Intervention: STEM Activity   Patient Response to Interventions:  Engaged   Education Outcome:  In group clarification offered    Clinical  Observations/Individualized Feedback: Pt was somewhat quiet in the beginning but opened up more as group went along. Pt was respectful to his peers and worked well with his partner in competing their tower.     Plan: Continue to engage patient in RT group sessions 2-3x/week.   Mickenzie Stolar-McCall, LRT,CTRS 11/08/2023 12:56 PM

## 2023-11-08 NOTE — BH IP Treatment Plan (Signed)
 Interdisciplinary Treatment and Diagnostic Plan Update  11/08/2023 Time of Session: 11:00 AM Jay Woods MRN: [id number]  Principal Diagnosis: Schizoaffective disorder, bipolar type (HCC)  Secondary Diagnoses: Principal Problem:   Schizoaffective disorder, bipolar type (HCC)   Current Medications:  Current Facility-Administered Medications  Medication Dose Route Frequency Provider Last Rate Last Admin   acetaminophen  (TYLENOL ) tablet 650 mg  650 mg Oral Q6H PRN Hobson, Fran E, NP       alum & mag hydroxide-simeth (MAALOX/MYLANTA) 200-200-20 MG/5ML suspension 30 mL  30 mL Oral Q4H PRN Hobson, Fran E, NP       haloperidol  (HALDOL ) tablet 5 mg  5 mg Oral TID PRN Hobson, Fran E, NP       And   diphenhydrAMINE  (BENADRYL ) capsule 50 mg  50 mg Oral TID PRN Hobson, Fran E, NP       haloperidol  lactate (HALDOL ) injection 5 mg  5 mg Intramuscular TID PRN Hobson, Fran E, NP       And   diphenhydrAMINE  (BENADRYL ) injection 50 mg  50 mg Intramuscular TID PRN Hobson, Fran E, NP       And   LORazepam  (ATIVAN ) injection 2 mg  2 mg Intramuscular TID PRN Hobson, Fran E, NP       haloperidol  lactate (HALDOL ) injection 10 mg  10 mg Intramuscular TID PRN Hobson, Fran E, NP       And   diphenhydrAMINE  (BENADRYL ) injection 50 mg  50 mg Intramuscular TID PRN Hobson, Fran E, NP       And   LORazepam  (ATIVAN ) injection 2 mg  2 mg Intramuscular TID PRN Hobson, Fran E, NP       divalproex  (DEPAKOTE ) DR tablet 750 mg  750 mg Oral Q12H Parker, Alvin S, MD   750 mg at 11/08/23 0848   magnesium  hydroxide (MILK OF MAGNESIA) suspension 30 mL  30 mL Oral Daily PRN Hobson, Fran E, NP       nicotine  (NICODERM CQ  - dosed in mg/24 hours) patch 21 mg  21 mg Transdermal Daily Parker, Alvin S, MD   21 mg at 11/08/23 0848   risperiDONE  (RISPERDAL ) tablet 3 mg  3 mg Oral BID Parker, Alvin S, MD   3 mg at 11/08/23 0848   traZODone  (DESYREL ) tablet 50 mg  50 mg Oral QHS PRN Hobson, Fran E, NP   50 mg at 11/07/23 2040    PTA Medications: No medications prior to admission.    Patient Stressors:    Patient Strengths:    Treatment Modalities: Medication Management, Group therapy, Case management,  1 to 1 session with clinician, Psychoeducation, Recreational therapy.   Physician Treatment Plan for Primary Diagnosis: Schizoaffective disorder, bipolar type (HCC) Long Term Goal(s):     Short Term Goals:    Medication Management: Evaluate patient's response, side effects, and tolerance of medication regimen.  Therapeutic Interventions: 1 to 1 sessions, Unit Group sessions and Medication administration.  Evaluation of Outcomes: Not Progressing  Physician Treatment Plan for Secondary Diagnosis: Principal Problem:   Schizoaffective disorder, bipolar type (HCC)  Long Term Goal(s):     Short Term Goals:       Medication Management: Evaluate patient's response, side effects, and tolerance of medication regimen.  Therapeutic Interventions: 1 to 1 sessions, Unit Group sessions and Medication administration.  Evaluation of Outcomes: Not Progressing   RN Treatment Plan for Primary Diagnosis: Schizoaffective disorder, bipolar type (HCC) Long Term Goal(s): Knowledge of disease and therapeutic regimen to maintain health will improve  Short Term Goals: Ability to remain free from injury will improve, Ability to verbalize frustration and anger appropriately will improve, Ability to demonstrate self-control, Ability to participate in decision making will improve, Ability to verbalize feelings will improve, Ability to disclose and discuss suicidal ideas, Ability to identify and develop effective coping behaviors will improve, and Compliance with prescribed medications will improve  Medication Management: RN will administer medications as ordered by provider, will assess and evaluate patient's response and provide education to patient for prescribed medication. RN will report any adverse and/or side effects to  prescribing provider.  Therapeutic Interventions: 1 on 1 counseling sessions, Psychoeducation, Medication administration, Evaluate responses to treatment, Monitor vital signs and CBGs as ordered, Perform/monitor CIWA, COWS, AIMS and Fall Risk screenings as ordered, Perform wound care treatments as ordered.  Evaluation of Outcomes: Not Progressing   LCSW Treatment Plan for Primary Diagnosis: Schizoaffective disorder, bipolar type (HCC) Long Term Goal(s): Safe transition to appropriate next level of care at discharge, Engage patient in therapeutic group addressing interpersonal concerns.  Short Term Goals: Engage patient in aftercare planning with referrals and resources, Increase social support, Increase ability to appropriately verbalize feelings, Increase emotional regulation, Facilitate acceptance of mental health diagnosis and concerns, Facilitate patient progression through stages of change regarding substance use diagnoses and concerns, Identify triggers associated with mental health/substance abuse issues, and Increase skills for wellness and recovery  Therapeutic Interventions: Assess for all discharge needs, 1 to 1 time with Social worker, Explore available resources and support systems, Assess for adequacy in community support network, Educate family and significant other(s) on suicide prevention, Complete Psychosocial Assessment, Interpersonal group therapy.  Evaluation of Outcomes: Not Progressing   Progress in Treatment: Attending groups: Yes. Participating in groups: Yes. Taking medication as prescribed: Yes. Toleration medication: Yes. Family/Significant other contact made: No, will contact:  Patient declined consents, CSW will attempt again. Patient understands diagnosis: Yes. Discussing patient identified problems/goals with staff: Yes. Medical problems stabilized or resolved: Yes. Denies suicidal/homicidal ideation: Yes. Issues/concerns per patient self-inventory: No. None  reported.  New problem(s) identified: No, Describe:  None described.  New Short Term/Long Term Goal(s): Medication stabilization, elimination of SI thoughts, development of comprehensive mental wellness plan.    Patient Goals: Just get my meds, eat well, and sleep well  Discharge Plan or Barriers: Patient recently admitted. CSW will continue to follow and assess for appropriate referrals and possible discharge planning.    Reason for Continuation of Hospitalization: Aggression Delusions  Medication stabilization  Estimated Length of Stay: 5-7 days  Last 3 [place] Suicide Severity Risk Score: Flowsheet Row Ad[facility] (Current) [facility] from 11/05/2023 in [facility] ED from 10/01/2023 in [facility] Department [facility]  C-SSRS RISK CATEGORY No Risk No Risk No Risk    Last [facility] 2/9 Scores:    08/02/2023   11:53 AM 10/06/2021   10:12 AM 10/06/2021    8:34 AM  Depression screen PHQ 2/9  Decreased Interest 0 0 1  Down, Depressed, Hopeless 0 0 3  PHQ - 2 Score 0 0 4  Altered sleeping 0 0 0  Tired, decreased energy 0 0 1  Change in appetite 0 0 1  Feeling bad or failure about yourself  0    Trouble concentrating 0 0 3  Moving slowly or fidgety/restless 0 0 3  Suicidal thoughts 0 0 0  PHQ-9 Score 0 0 12  Difficult doing work/chores Not difficult  at all  Not difficult at all    Scribe for Treatment Team: Louetta Lame, LCSWA 11/08/2023 12:10 PM

## 2023-11-08 NOTE — Progress Notes (Signed)
   11/08/23 0848  Psych Admission Type (Psych Patients Only)  Admission Status Involuntary  Psychosocial Assessment  Patient Complaints None  Eye Contact Fair  Facial Expression Animated  Affect Appropriate to circumstance  Speech Logical/coherent  Interaction Cautious  Motor Activity Slow  Appearance/Hygiene Unremarkable  Behavior Characteristics Cooperative  Mood Pleasant  Thought Process  Coherency WDL  Content WDL  Delusions None reported or observed  Perception WDL  Hallucination None reported or observed  Judgment WDL  Confusion None  Danger to Self  Current suicidal ideation? Denies  Agreement Not to Harm Self Yes  Description of Agreement verbal  Danger to Others  Danger to Others None reported or observed

## 2023-11-08 NOTE — Progress Notes (Signed)
(  Sleep Hours) -9.5  (Any PRNs that were needed, meds refused, or side effects to meds)- Trazodone  50mg   (Any disturbances and when (visitation, over night)-none  (Concerns raised by the patient)- none  (SI/HI/AVH)- denies

## 2023-11-09 MED ORDER — RISPERIDONE 2 MG PO TABS
2.0000 mg | ORAL_TABLET | Freq: Two times a day (BID) | ORAL | Status: DC
  Administered 2023-11-09 – 2023-11-11 (×4): 2 mg via ORAL
  Filled 2023-11-09 (×4): qty 1

## 2023-11-09 NOTE — Progress Notes (Signed)
 [facility] Inpatient Psychiatry Progress Note  Date: 11/09/23 Patient: Jay Woods MRN: [id number]  Assessment and Plan: Jay Woods is a 23 y.o. male admitted for decompensated psychosis in the setting of noncompliance with his prescribed medications. He was petitioned by his sister who reports prior to admission he was neglecting his hygiene, exhibiting aggression, banging on neighbors' doors, and acting erratically.   9/16 - Patient appears to be rapidly improving, though suspect he is minimizing symptoms. He is tolerating paliperidone  thus far. Next loading dose can be administered on the 18th. Possible discharge that date.   # Schizoaffective disorder, bipolar type (HCC) - Depakote  750 mg q12h. Next level 9/18 - risperidone  2 mg BID - Invega  Sustenna 234 mg IM administered 9/14 - Next loading dose 154 mg IM on 9/18   Risk Assessment - Moderate  Discharge Planning Barriers to discharge: Active psychosis  Estimated length of stay: 3-5 days Predicted Discharge location: Home     Interval History and update: Chart reviewed. No significant interval events. Patient has been compliant with medications without any evidence of side effects. He slept well last night. He has been quiet, but visible in the milieu and has been exhibiting overall appropriate behaviors. He reported feeling fine today and denied any issues regarding his medications. He inquired about a potential discharge date. He denied AVH or SI.      Physical Exam MSK/Neuro - Normal gait and station Mental Status Exam Appearance - Casually dressed, appropriate hygiene Attitude - Calm, polite, not overtly guarded Speech - normal volume, prosody, inflection Mood - Fine Affect - mildly restricted Thought Process - GD Thought Content - No clear delusions expressed SI/HI - Denies Perceptions - Denies; not observed RIS Judgement/Insight - Poor Fund of knowledge - WNL Language  - No impairments      Lab Results:  Admission on 11/06/2023  Component Date Value Ref Range Status   Folate 11/08/2023 13.6  >5.9 ng/mL Final   HIV Screen 4th Generation wRfx 11/08/2023 Non Reactive  Non Reactive Final   RPR Ser Ql 11/08/2023 NON REACTIVE  NON REACTIVE Final   Vit D, 25-Hydroxy 11/08/2023 32.82  30 - 100 ng/mL Final   Vitamin B-12 11/08/2023 442  180 - 914 pg/mL Final     Vitals: Blood pressure 127/74, pulse 84, temperature 97.8 F (36.6 C), temperature source Oral, resp. rate 18, height 5' 9 (1.753 m), weight 68.5 kg, SpO2 100%.    Oliva DELENA Salmon, DO

## 2023-11-09 NOTE — Progress Notes (Signed)
 Patient denies SI, HI, and AVH this shift. Patient has been compliant with medications and actively engaged in groups. Patient has exhibited no behavioral dyscontrol.   Assess patient for safety, offer medications as prescribed, engage patient in 1:1 staff talks.   Patient able to contract for safety. Continue to monitor as planned.

## 2023-11-09 NOTE — [facility] Group Notes (Signed)
 Adult Psychoeducational Group Note  Date:  11/09/2023 Time:  8:23 PM  Group Topic/Focus:  Wrap-Up Group:   The focus of this group is to help patients review their daily goal of treatment and discuss progress on daily workbooks.  Participation Level:  Active  Participation Quality:  Appropriate  Affect:  Appropriate  Cognitive:  Appropriate  Insight: Appropriate  Engagement in Group:  Engaged  Modes of Intervention:  Discussion and Support  Additional Comments:  Pt told that today was a good day on the unit, the highlight of which was talking to his two-year-old son on the phone. On the subject of ways to stay well upon discharge, Pt mentioned wanting to stop smoking THC and also taking his medications. Pt rated his day an 8 out of 10.  Berneda Piccininni Lee 11/09/2023, 8:23 PM

## 2023-11-09 NOTE — Progress Notes (Signed)
(  Sleep Hours) -7.5  (Any PRNs that were needed, meds refused, or side effects to meds)- Trazodone  50mg   (Any disturbances and when (visitation, over night)-none  (Concerns raised by the patient)- none  (SI/HI/AVH)- denies

## 2023-11-09 NOTE — Group Note (Signed)
 Recreation Therapy Group Note   Group Topic:Animal Assisted Therapy   Group Date: 11/09/2023 Start Time: 0947 End Time: 1030 Facilitators: Bhavin Monjaraz-McCall, LRT,CTRS Location: [address] Dayroom   Animal-Assisted Activity (AAA) Program Checklist/Progress Notes Patient Eligibility Criteria Checklist & Daily Group note for Rec Tx Intervention  AAA/T Program Assumption of Risk Form signed by Patient/ or Parent Legal Guardian Yes  Patient is free of allergies or severe asthma Yes  Patient reports no fear of animals Yes  Patient reports no history of cruelty to animals Yes  Patient understands his/her participation is voluntary Yes  Patient washes hands before animal contact Yes  Patient washes hands after animal contact Yes  Behavioral Response: Minimal   Education: Hand Washing, Appropriate Animal Interaction   Education Outcome: Acknowledges education.    Affect/Mood: Appropriate   Participation Level: Minimal   Participation Quality: Independent   Behavior: Attentive    Speech/Thought Process: Barely audible    Insight: Fair   Judgement: Fair    Modes of Intervention: Teaching laboratory technician   Patient Response to Interventions:  Receptive   Education Outcome:  In group clarification offered    Clinical Observations/Individualized Feedback: Pt was soft spoken and had minimal interaction before going back to his hall.     Plan: Continue to engage patient in RT group sessions 2-3x/week.   Jayven Naill-McCall, LRT,CTRS 11/09/2023 12:43 PM

## 2023-11-09 NOTE — Group Note (Signed)
 Recreation Therapy Group Note   Group Topic:Health and Wellness  Group Date: 11/09/2023 Start Time: 1034 End Time: 1104 Facilitators: Shanell Aden-McCall, LRT,CTRS Location: 500 Hall Dayroom   Group Topic: Wellness  Goal Area(s) Addresses:  Patient will define components of whole wellness. Patient will verbalize benefit of whole wellness.  Behavioral Response: Minimal  Intervention: Meditation Music, Guided Imagery  Activity: LRT and patients discussed the importance of wellness and its impact on an individuals overall well-being. LRT and patients went through a series of yoga moves to stretch and engage muscles. LRT then played the mountain meditation. This meditation focused on taking on the aspects/structure of a mountain. In that visualization, patients were to see themselves being able to withstand anything that comes against them just like the mountain can withstand different types of weather or any pricks that may happen to it.   Education: Wellness, Building control surveyor.   Education Outcome: Acknowledges education/In group clarification offered/Needs additional education.    Affect/Mood: Flat   Participation Level: Minimal   Participation Quality: Independent   Behavior: Attentive    Speech/Thought Process: Relevant   Insight: Moderate   Judgement: Moderate   Modes of Intervention: Music   Patient Response to Interventions:  Attentive   Education Outcome:  In group clarification offered    Clinical Observations/Individualized Feedback: Pt was attentive during group. Pt had little interaction during the yoga section of group. Pt did however, lay on the floor with his eyes closed during the meditation. Pt shared with group he doesn't make time for his wellness because he spends most of his time working.      Plan: Continue to engage patient in RT group sessions 2-3x/week.   Hideo Googe-McCall, LRT,CTRS 11/09/2023 1:08 PM

## 2023-11-09 NOTE — Plan of Care (Signed)
   Problem: Education: Goal: Emotional status will improve Outcome: Progressing Goal: Mental status will improve Outcome: Progressing Goal: Verbalization of understanding the information provided will improve Outcome: Progressing

## 2023-11-09 NOTE — Plan of Care (Signed)
   Problem: Education: Goal: Knowledge of [place] [facility] Education information/materials will improve Outcome: Progressing Goal: Emotional status will improve Outcome: Progressing Goal: Mental status will improve Outcome: Progressing Goal: Verbalization of understanding the information provided will improve Outcome: Progressing

## 2023-11-10 MED ORDER — PALIPERIDONE PALMITATE ER 156 MG/ML IM SUSY: 156.0000 mg | PREFILLED_SYRINGE | Freq: Once | INTRAMUSCULAR | Status: DC

## 2023-11-10 NOTE — Progress Notes (Signed)
(  Sleep Hours) -9   (Any PRNs that were needed, meds refused, or side effects to meds)- Trazodone  50  (Any disturbances and when (visitation, over night)-N/A  (Concerns raised by the patient)- none  (SI/HI/AVH)-denies

## 2023-11-10 NOTE — Progress Notes (Signed)
 Patient denies SI, HI, and AVH. Patient has had no incidents of behavioral dyscontrol.  Assess patient for safety, offer medications as prescribed, engage patient in 1:1 staff talks.   Patient able to contract for safety. Continue to monitor as planned.

## 2023-11-10 NOTE — Plan of Care (Signed)
   Problem: Education: Goal: Emotional status will improve Outcome: Progressing Goal: Mental status will improve Outcome: Progressing Goal: Verbalization of understanding the information provided will improve Outcome: Progressing

## 2023-11-10 NOTE — Progress Notes (Signed)
 [facility] Inpatient Psychiatry Progress Note  Date: 11/10/23 Patient: Jay Woods MRN: [id number]  Assessment and Plan: Jay Woods is a 23 y.o. male admitted for decompensated psychosis in the setting of noncompliance with his prescribed medications. He was petitioned by his sister who reports prior to admission he was neglecting his hygiene, exhibiting aggression, banging on neighbors' doors, and acting erratically.   9/17- Patient doing well despite exhibiting some lingering psychosis and paranoia. He has been compliant with his prescribed medications and is tolerating them. No clear ongoing imminent danger to self or others appears present. Plan to discharge to home tomorrow after final loading dose of Invega .   # Schizoaffective disorder, bipolar type (HCC) - Depakote  750 mg q12h. Next level 9/18 - risperidone  2 mg BID - Invega  Sustenna 234 mg IM administered 9/14 - Next loading dose 154 mg IM on 9/18   Risk Assessment - Moderate  Discharge Planning Barriers to discharge: Medication titration. Estimated length of stay: tomorrow Predicted Discharge location: Home     Interval History and update: Chart reviewed. No significant interval events. Patient slept well last night. He has been behaviorally appropriate and compliant with medications. No EPS observed nor reported. He remains mildly to moderately guarded and suspicious/paranoid, but denies SI or AVH. He denied any significant anxiety or depressed mood. He requested to discharge tomorrow after the next Invega  injection tomorrow morning.       Physical Exam MSK/Neuro - Normal gait and station Mental Status Exam Appearance - Casually dressed, appropriate hygiene Attitude - Calm, polite, not overtly guarded Speech - normal volume, prosody, inflection Mood - Good Affect - mildly restricted Thought Process - GD, linear, mostly logical Thought Content - No clear delusions  expressed SI/HI - Denies Perceptions - Denies; not observed RIS Judgement/Insight - Fair Fund of knowledge - WNL Language - No impairments      Lab Results:  Admission on 11/06/2023  Component Date Value Ref Range Status   Folate 11/08/2023 13.6  >5.9 ng/mL Final   HIV Screen 4th Generation wRfx 11/08/2023 Non Reactive  Non Reactive Final   RPR Ser Ql 11/08/2023 NON REACTIVE  NON REACTIVE Final   Vit D, 25-Hydroxy 11/08/2023 32.82  30 - 100 ng/mL Final   Vitamin B-12 11/08/2023 442  180 - 914 pg/mL Final     Vitals: Blood pressure 127/84, pulse 91, temperature 98.7 F (37.1 C), temperature source Oral, resp. rate 18, height 5' 9 (1.753 m), weight 68.5 kg, SpO2 100%.    Jay DELENA Salmon, DO

## 2023-11-10 NOTE — [facility] Suicide Risk Assessment (Addendum)
 [facility] INPATIENT:  Family/Significant Other Suicide Prevention Education  Suicide Prevention Education:  Contact Attempts: Carmelia (sister) [phone], (name of family member/significant other) has been identified by the patient as the family member/significant other with whom the patient will be residing, and identified as the person(s) who will aid the patient in the event of a mental health crisis.  With written consent from the patient, two attempts were made to provide suicide prevention education, prior to and/or following the patient's discharge.  We were unsuccessful in providing suicide prevention education.  A suicide education pamphlet was given to the patient to share with family/significant other.  Date and time of first attempt: 11/10/2023 / 12:31 PM  The message said, We are sorry, your call can't be completed at this time.  Please try your call again later.  Date and time of second attempt:  11/10/2023, 2:53 PM    The message said, We are sorry, your call can't be completed at this time.  Please try your call again later.  Date and time of third attempt:  11/10/2023, 7:24 PM    The message said, We are sorry, your call can't be completed at this time.  Please try your call again later.   Hawkins Seaman O Lea Baine, LCSWA 11/10/2023, 12:30 PM

## 2023-11-10 NOTE — Group Note (Signed)
 Date:  11/11/2023 Time:  4:37 AM  Group Topic/Focus:  Wrap-Up Group:   The focus of this group is to help patients review their daily goal of treatment and discuss progress on daily workbooks.    Participation Level:  Minimal  Participation Quality:  Appropriate, Inattentive, and Sharing  Affect:  Appropriate  Cognitive:  Appropriate  Insight: Appropriate  Engagement in Group:  Engaged  Modes of Intervention:  Discussion and Socialization  Additional Comments:  Patient stated that he had a good day. Patient rated his day a 8 out of 10, Patient shared that he went outside today and played basketball. Patient also shared that he is being discharged on tomorrow.   Eward Mace 11/11/2023, 4:37 AM

## 2023-11-10 NOTE — Group Note (Signed)
 Recreation Therapy Group Note   Group Topic:Leisure Education  Group Date: 11/10/2023 Start Time: 1010 End Time: 1035 Facilitators: Sanford Lindblad-McCall, LRT,CTRS Location: 500 Hall Dayroom   Group Topic: Leisure Education  Goal Area(s) Addresses:  Patient will identify positive leisure activities for use post discharge. Patient will identify at least one positive benefit of participation in leisure activities.  Patient will work effectively work with peers to keep the ball in play.  Behavioral Response: Active   Intervention: [facility], Music   Activity: Patients were to sit in a circle. Patients would toss a beach ball to each other keeping the ball in motion. LRT would time the group to see how long they could keep the ball moving. Patients could bounce or roll the ball but it could not come to a stop. If the ball were to stop moving, LRT would start the timer over.  Education:  Leisure Scientist, physiological, Special educational needs teacher, Teamwork, Discharge Planning  Education Outcome: Acknowledges education/In group clarification offered/Needs additional education.    Affect/Mood: Appropriate   Participation Level: Active   Participation Quality: Independent   Behavior: Appropriate and Distracted   Speech/Thought Process: Relevant   Insight: Good   Judgement: Good   Modes of Intervention: Cooperative Play   Patient Response to Interventions:  Engaged   Education Outcome:  In group clarification offered    Clinical Observations/Individualized Feedback: Pt was distracted when he first came to group due to wanting to speak with his doctor. Pt stepped out of group to let other staff know of his concerns before returning more focused. Pt was bright and engaged. Pt was also social with peers.     Plan: Continue to engage patient in RT group sessions 2-3x/week.   Colin Ellers-McCall, LRT,CTRS 11/10/2023 1:11 PM

## 2023-11-10 NOTE — Plan of Care (Signed)
   Problem: Education: Goal: Emotional status will improve Outcome: Progressing Goal: Mental status will improve Outcome: Progressing   Problem: Activity: Goal: Interest or engagement in activities will improve Outcome: Progressing Goal: Sleeping patterns will improve Outcome: Progressing   Problem: Safety: Goal: Periods of time without injury will increase Outcome: Progressing

## 2023-11-11 DIAGNOSIS — F250 Schizoaffective disorder, bipolar type: Principal | ICD-10-CM

## 2023-11-11 LAB — VALPROIC ACID LEVEL: Valproic Acid Lvl: 108 ug/mL — ABNORMAL HIGH (ref 50–100)

## 2023-11-11 MED ORDER — DIVALPROEX SODIUM 250 MG PO DR TAB: 500.0000 mg | DELAYED_RELEASE_TABLET | Freq: Two times a day (BID) | ORAL | 0 refills | Status: AC

## 2023-11-11 MED ORDER — INVEGA SUSTENNA 156 MG/ML IM SUSY: 156.0000 mg | PREFILLED_SYRINGE | Freq: Once | INTRAMUSCULAR | 0 refills | Status: AC

## 2023-11-11 MED ORDER — PALIPERIDONE PALMITATE ER 156 MG/ML IM SUSY
156.0000 mg | PREFILLED_SYRINGE | Freq: Once | INTRAMUSCULAR | Status: AC
  Administered 2023-11-11: 156 mg via INTRAMUSCULAR
  Filled 2023-11-11: qty 1

## 2023-11-11 NOTE — Progress Notes (Addendum)
  [facility] Adult Case Management Discharge Plan :  Will you be returning to the same living situation after discharge:  No.  Patient will go to [facility]). At discharge, do you have transportation home?: Yes,  CSW arranged transportation through [facility] for 9:45 AM Do you have the ability to pay for your medications: Yes,  Patient has Medicaid.  Patient was given bus tickets for his medical appointments.  Release of information consent forms completed and in the chart;  Patient's signature needed at discharge.  Patient to Follow up at:  Follow-up Information     [facility] Follow up on 11/12/2023.   Why: Please call this provider on 11/12/23 at 9:00 am to schedule a hospital follow up appointment for therapy and medication management services. Contact information: [address]  [address] [place] [address] [phone]                 Next level of care provider has access to [facility] Link:no  Safety Planning and Suicide Prevention discussed: Yes,  with patient   Has patient been referred to the Quitline?:  CSW submitted a referral to Quitline on 11/10/2023.  Patient has been referred for addiction treatment:  At admission, patient tested positive for marijuana.  Patient said that he wants to quit using it, and will work on it with his therapist.   Raylynne Cubbage O Greidy Sherard, LCSWA 11/11/2023, 8:55 AM

## 2023-11-11 NOTE — Progress Notes (Signed)
 Recreation Therapy Notes  INPATIENT RECREATION TR PLAN  Patient Details Name: Jay Woods MRN: [id number] DOB: [birth year] Today's Date: 11/11/2023  Rec Therapy Plan Is patient appropriate for Therapeutic Recreation?: Yes Treatment times per week: about 3 days Estimated Length of Stay: 5-7 days TR Treatment/Interventions: Group participation (Comment)  Discharge Criteria Pt will be discharged from therapy if:: Discharged Treatment plan/goals/alternatives discussed and agreed upon by:: Patient/family  Discharge Summary Short term goals set: See patient care plan Short term goals met: Adequate for discharge Progress toward goals comments: Groups attended Which groups?: Wellness, Communication, Leisure education, AAA/T Reason goals not met: Pt didn't identify coping skills but was introduced to some positive one during group sessions. Therapeutic equipment acquired: N/Jay Reason patient discharged from therapy: Discharge from hospital Pt/family agrees with progress & goals achieved: Yes Date patient discharged from therapy: 11/11/23   Jay Woods, LRT,CTRS Jay Woods Jay Woods 11/11/2023, 11:40 AM

## 2023-11-11 NOTE — BHH Suicide Risk Assessment (Signed)
 [facility] Discharge Suicide Risk Assessment   Principal Problem: Schizoaffective disorder, bipolar type [facility]) Discharge Diagnoses: Principal Problem:   Schizoaffective disorder, bipolar type (HCC)    Mental Status Per Nursing Assessment::   On Admission:  Thoughts of violence towards others  Demographic Factors:  Male and Adolescent or young adult  Loss Factors: NA  Historical Factors: Impulsivity  Risk Reduction Factors:   Sense of responsibility to family, Employed, and Positive social support  Continued Clinical Symptoms:  Schizoaffective disorder, bipolar type  Cognitive Features That Contribute To Risk:  None    Suicide Risk:  Mild:  Suicidal ideation of limited frequency, intensity, duration, and specificity.  There are no identifiable plans, no associated intent, mild dysphoria and related symptoms, good self-control (both objective and subjective assessment), few other risk factors, and identifiable protective factors, including available and accessible social support.   Follow-up Information     [facility] Follow up on 11/12/2023.   Why: Please call this provider on 11/12/23 at 9:00 am to schedule a hospital follow up appointment for therapy and medication management services. Contact information: [address]  [address] [place] [address] [phone]                  Jay DELENA Salmon, DO 11/11/2023, 9:20 AM

## 2023-11-11 NOTE — Discharge Summary (Signed)
 Physician Discharge Summary Note  Patient:  Jay Woods is an 23 y.o., male MRN:  [id number] DOB:  23-Oct-2000 Patient phone:  [phone] (home)  Patient address:   [address] [place] [address],  Total Time spent with patient: {Time; 15 min - 8 hours:17441}  Date of Admission:  11/06/2023 Date of Discharge: 11/11/2023  Reason for Admission:  ***  Principal Problem: Schizoaffective disorder, bipolar type [facility]) Discharge Diagnoses: Principal Problem:   Schizoaffective disorder, bipolar type [facility])   Past Psychiatric History: ***  Past Medical History:  Past Medical History:  Diagnosis Date   Eczema    Schizoaffective disorder (HCC)    History reviewed. No pertinent surgical history. Family History:  Family History  Problem Relation Age of Onset   Healthy Mother    Healthy Father    Family Psychiatric  History: *** Social History:  Social History   Substance and Sexual Activity  Alcohol Use Not Currently   Alcohol/week: 1.0 standard drink of alcohol   Types: 1 Cans of beer per week   Comment: last use 09/30/21     Social History   Substance and Sexual Activity  Drug Use Not Currently   Comment: Hx of cocaine, marijuana, k2, Xanax, and Molly on and off for the past 5 years    Social History   Socioeconomic History   Marital status: Single    Spouse name: Not on file   Number of children: Not on file   Years of education: Not on file   Highest education level: Not on file  Occupational History   Not on file  Tobacco Use   Smoking status: Former    Current packs/day: 0.25    Average packs/day: 0.3 packs/day for 5.0 years (1.3 ttl pk-yrs)    Types: Cigarettes   Smokeless tobacco: Former  Building services engineer status: Every Day   Substances: CBD  Substance and Sexual Activity   Alcohol use: Not Currently    Alcohol/week: 1.0 standard drink of alcohol    Types: 1 Cans of beer per week    Comment: last use 09/30/21   Drug use: Not Currently     Comment: Hx of cocaine, marijuana, k2, Xanax, and Molly on and off for the past 5 years   Sexual activity: Yes    Partners: Female    Birth control/protection: None  Other Topics Concern   Not on file  Social History Narrative   Not on file   Social Drivers of Health   Financial Resource Strain: High Risk (10/06/2021)   Overall Financial Resource Strain (CARDIA)    Difficulty of Paying Living Expenses: Very hard  Food Insecurity: No Food Insecurity (11/06/2023)   Hunger Vital Sign    Worried About Running Out of Food in the Last Year: Never true    Ran Out of Food in the Last Year: Never true  Recent Concern: Food Insecurity - Food Insecurity Present (11/05/2023)   Hunger Vital Sign    Worried About Running Out of Food in the Last Year: Sometimes true    Ran Out of Food in the Last Year: Sometimes true  Transportation Needs: No Transportation Needs (11/06/2023)   PRAPARE - Administrator, Civil Service (Medical): No    Lack of Transportation (Non-Medical): No  Recent Concern: Transportation Needs - Unmet Transportation Needs (11/05/2023)   PRAPARE - Administrator, Civil Service (Medical): Yes    Lack of Transportation (Non-Medical): Yes  Physical Activity: Sufficiently  Active (10/06/2021)   Exercise Vital Sign    Days of Exercise per Week: 7 days    Minutes of Exercise per Session: 30 min  Stress: No Stress Concern Present (10/06/2021)   [employer] of Occupational Health - Occupational Stress Questionnaire    Feeling of Stress : Only a little  Social Connections: Socially Integrated (10/06/2021)   Social Connection and Isolation Panel    Frequency of Communication with Friends and Family: More than three times a week    Frequency of Social Gatherings with Friends and Family: More than three times a week    Attends Religious Services: More than 4 times per year    Active Member of [employer] or Organizations: No    Attends Engineer, structural:  More than 4 times per year    Marital Status: Living with partner    Hospital Course:  ***  Physical Findings: AIMS:  , ,  ,  ,  ,  ,   CIWA:    COWS:     Musculoskeletal: Strength & Muscle Tone: {desc; muscle tone:32375} Gait & Station: {PE GAIT ED WJUO:77474} Patient leans: {Patient Leans:21022755}   Psychiatric Specialty Exam:  Presentation  General Appearance:  Disheveled  Eye Contact: Fair  Speech: Blocked  Speech Volume: Normal  Handedness: Right   Mood and Affect  Mood: Dysphoric  Affect: Restricted; Depressed   Thought Process  Thought Processes: Disorganized  Descriptions of Associations:Tangential  Orientation:Partial  Thought Content:Delusions; Tangential  History of Schizophrenia/Schizoaffective disorder:Yes  Duration of Psychotic Symptoms:Greater than six months  Hallucinations:No data recorded Ideas of Reference:None  Suicidal Thoughts:No data recorded Homicidal Thoughts:No data recorded  Sensorium  Memory: Immediate Good  Judgment: Poor  Insight: Poor   Executive Functions  Concentration: Fair  Attention Span: Fair  Recall: Good  Fund of Knowledge: Good  Language: Good   Psychomotor Activity  Psychomotor Activity:No data recorded  Assets  Assets: Communication Skills; Desire for Improvement   Sleep  Sleep:No data recorded Estimated Sleeping Duration (Last 24 Hours): 8.50-9.25 hours   Physical Exam: Physical Exam Vitals and nursing note reviewed.  HENT:     Head: Normocephalic and atraumatic.  Eyes:     Extraocular Movements: Extraocular movements intact.  Pulmonary:     Effort: Pulmonary effort is normal.  Musculoskeletal:        General: Normal range of motion.  Skin:    General: Skin is warm and dry.    ROS Blood pressure 132/76, pulse 72, temperature 97.9 F (36.6 C), temperature source Oral, resp. rate 18, height 5' 9 (1.753 m), weight 68.5 kg, SpO2 99%. Body mass index is  22.3 kg/m.   Social History   Tobacco Use  Smoking Status Former   Current packs/day: 0.25   Average packs/day: 0.3 packs/day for 5.0 years (1.3 ttl pk-yrs)   Types: Cigarettes  Smokeless Tobacco Former   Tobacco Cessation:  {Discharge tobacco cessation prescription:304700209}   Blood Alcohol level:  Lab Results  Component Value Date   [facility] <15 07/10/2023   ETH <10 09/24/2021    Metabolic Disorder Labs:  Lab Results  Component Value Date   HGBA1C 5.0 11/05/2023   MPG 96.8 11/05/2023   MPG 96.8 07/12/2023   Lab Results  Component Value Date   PROLACTIN 4.4 06/09/2020   Lab Results  Component Value Date   CHOL 167 11/05/2023   TRIG 72 11/05/2023   HDL 59 11/05/2023   CHOLHDL 2.8 11/05/2023   VLDL 14 11/05/2023   LDLCALC  94 11/05/2023   LDLCALC 72 07/12/2023    See Psychiatric Specialty Exam and Suicide Risk Assessment completed by Attending Physician prior to discharge.  Discharge destination:  {DISCHARGE DESTINATION:22616}  Is patient on multiple antipsychotic therapies at discharge:  {RECOMMEND TAPERING:22617}   Has Patient had three or more failed trials of antipsychotic monotherapy by history:  {BHH ANTIPSYCHOTIC:22903}  Recommended Plan for Multiple Antipsychotic Therapies: {BHH MULTIPLE ANTIPSYCHOTIC THERAPIES:22905}  Discharge Instructions     Discharge patient   Complete by: As directed    Discharge disposition: 01-Home or Self Care   Discharge patient date: 11/11/2023      Allergies as of 11/11/2023   No Known Allergies      Medication List     TAKE these medications      Indication  divalproex  250 MG DR tablet Commonly known as: DEPAKOTE  Take 2 tablets (500 mg total) by mouth every 12 (twelve) hours for 14 days.  Indication: MIXED BIPOLAR AFFECTIVE DISORDER   Invega  Sustenna 156 MG/ML Susy injection Generic drug: paliperidone  Inject 1 mL (156 mg total) into the muscle once for 1 dose. Start taking on: December 09, 2023   Indication: Schizoaffective Disorder        Follow-up Information     [facility] Follow up on 11/12/2023.   Why: Please call this provider on 11/12/23 at 9:00 am to schedule a hospital follow up appointment for therapy and medication management services. Contact information: [address]  [address] [place] [address] [phone]                 Follow-up recommendations:  {BHH DC FU RECOMMENDATIONS:22620}  Comments:  ***  Signed: Oliva DELENA Salmon, DO 11/11/2023, 9:27 AM

## 2023-11-11 NOTE — Progress Notes (Signed)
 Pt discharged to lobby. Pt was stable and appreciative at that time. All papers and prescriptions were given and valuables returned. Verbal understanding expressed. Denies SI/HI and A/VH. Pt given opportunity to express concerns and ask questions.

## 2023-11-11 NOTE — BHH Suicide Risk Assessment (Signed)
 BHH INPATIENT:  Family/Significant Other Suicide Prevention Education  Suicide Prevention Education:  Education Completed; with patient,  (name of family member/significant other) has been identified by the patient as the family member/significant other with whom the patient will be residing, and identified as the person(s) who will aid the patient in the event of a mental health crisis (suicidal ideations/suicide attempt).  With written consent from the patient, the family member/significant other has been provided the following suicide prevention education, prior to the and/or following the discharge of the patient.  Patient was given Suicide Prevention Information brochure.  He said he doesn't have any guns or weapons.  The suicide prevention education provided includes the following: Suicide risk factors Suicide prevention and interventions National Suicide Hotline telephone number [facility] assessment telephone number [facility] Emergency Assistance 911 [facility] and/or Residential Mobile Crisis Unit telephone number  Request made of family/significant other to: Remove weapons (e.g., guns, rifles, knives), all items previously/currently identified as safety concern.   Remove drugs/medications (over-the-counter, prescriptions, illicit drugs), all items previously/currently identified as a safety concern.  The family member/significant other verbalizes understanding of the suicide prevention education information provided.  The family member/significant other agrees to remove the items of safety concern listed above.   Mingo Siegert O Alexei Doswell, LCSWA 11/10/2023

## 2023-11-11 NOTE — Transportation (Signed)
 11/10/2023   Jay Woods DOB: 03/28/2000 MRN: [id number]   RIDER WAIVER AND RELEASE OF LIABILITY  For the purposes of helping with transportation needs, [facility] partners with outside transportation providers (taxi companies, [place], Catering manager.) to give [employer] patients or other approved people the choice of on-demand rides Public librarian) to our buildings for non-emergency visits.  By using [employer], I, the person signing this document, on behalf of myself and/or any legal minors (in my care using the [employer]), agree:  Science writer given to me are supplied by independent, outside transportation providers who do not work for, or have any affiliation with, [employer]. [place] is not a transportation company. [facility] has no control over the quality or safety of the rides I get using [employer]. [facility] has no control over whether any outside ride will happen on time or not. [facility] gives no guarantee on the reliability, quality, safety, or availability on any rides, or that no mistakes will happen. I know and accept that traveling by vehicle (car, truck, SVU, fleeta, bus, taxi, etc.) has risks of serious injuries such as disability, being paralyzed, and death. I know and agree the risk of using [employer] is mine alone, and not [employer]. [employer] are provided as is and as are available. The transportation providers are in charge for all inspections and care of the vehicles used to provide these rides. I agree not to take legal action against [facility], its agents, employees, officers, directors, representatives, insurers, attorneys, assigns, successors, subsidiaries, and affiliates at any time for any reasons related directly or indirectly to using [employer]. I also agree not to take legal action against [employer] or its affiliates for any injury, death, or damage to property caused by or related to  using [employer]. I have read this Waiver and Release of Liability, and I understand the terms used in it and their legal meaning. This Waiver is freely and voluntarily given with the understanding that my right (or any legal minors) to legal action against [place] relating to [employer] is knowingly given up to use these services.   I attest that I read the Ride Waiver and Release of Liability to Jay Woods, gave [name] the opportunity to ask questions and answered the questions asked (if any). I affirm that Jay Woods then provided consent for assistance with transportation.       Miko Markwood, LCSWA 11/10/2023

## 2023-11-11 NOTE — Plan of Care (Signed)
 Patient didn't identify positive coping skills but was introduced to some positive coping skills patient could take advantage of once discharged. Patient was attentive and appeared receptive as these techniques were introduced.   Jay Woods, LRT,CTRS

## 2023-11-11 NOTE — Progress Notes (Signed)
 Patient was given return to work letter.   Federico Maiorino, LCSWA 11/11/2023

## 2023-12-10 ENCOUNTER — Other Ambulatory Visit (HOSPITAL_COMMUNITY): Payer: Self-pay | Admitting: Psychiatry

## 2023-12-10 DIAGNOSIS — F250 Schizoaffective disorder, bipolar type: Secondary | ICD-10-CM

## 2024-02-02 ENCOUNTER — Telehealth: Payer: Self-pay

## 2024-02-02 NOTE — Telephone Encounter (Signed)
 Called pt to set up new pt appt. Busy line
